# Patient Record
Sex: Female | Born: 1985
Health system: Southern US, Community
[De-identification: ages and names within clinical notes are randomized; demographics above are authoritative.]

## PROBLEM LIST (undated history)

## (undated) DIAGNOSIS — J45909 Unspecified asthma, uncomplicated: Secondary | ICD-10-CM

## (undated) DIAGNOSIS — E119 Type 2 diabetes mellitus without complications: Secondary | ICD-10-CM

## (undated) DIAGNOSIS — M069 Rheumatoid arthritis, unspecified: Secondary | ICD-10-CM

## (undated) HISTORY — PX: LAPAROSCOPIC OVARIAN: SHX5906

---

## 2018-03-30 ENCOUNTER — Inpatient Hospital Stay (HOSPITAL_COMMUNITY)
Admission: EM | Admit: 2018-03-30 | Discharge: 2018-04-01 | DRG: 638 | Disposition: A | Discharge status: Home / Self Care | Payer: Self-pay | Attending: Internal Medicine | Admitting: Internal Medicine

## 2018-03-30 ENCOUNTER — Encounter (HOSPITAL_COMMUNITY): Payer: Self-pay

## 2018-03-30 ENCOUNTER — Other Ambulatory Visit: Payer: Self-pay

## 2018-03-30 DIAGNOSIS — J45909 Unspecified asthma, uncomplicated: Secondary | ICD-10-CM | POA: Diagnosis present

## 2018-03-30 DIAGNOSIS — E1010 Type 1 diabetes mellitus with ketoacidosis without coma: Principal | ICD-10-CM | POA: Diagnosis present

## 2018-03-30 DIAGNOSIS — E860 Dehydration: Secondary | ICD-10-CM | POA: Diagnosis present

## 2018-03-30 DIAGNOSIS — E0810 Diabetes mellitus due to underlying condition with ketoacidosis without coma: Secondary | ICD-10-CM

## 2018-03-30 DIAGNOSIS — F17210 Nicotine dependence, cigarettes, uncomplicated: Secondary | ICD-10-CM | POA: Diagnosis present

## 2018-03-30 DIAGNOSIS — N170 Acute kidney failure with tubular necrosis: Secondary | ICD-10-CM

## 2018-03-30 DIAGNOSIS — F17200 Nicotine dependence, unspecified, uncomplicated: Secondary | ICD-10-CM | POA: Diagnosis present

## 2018-03-30 DIAGNOSIS — R079 Chest pain, unspecified: Secondary | ICD-10-CM

## 2018-03-30 DIAGNOSIS — D72829 Elevated white blood cell count, unspecified: Secondary | ICD-10-CM | POA: Diagnosis present

## 2018-03-30 DIAGNOSIS — N179 Acute kidney failure, unspecified: Secondary | ICD-10-CM | POA: Diagnosis present

## 2018-03-30 DIAGNOSIS — M069 Rheumatoid arthritis, unspecified: Secondary | ICD-10-CM | POA: Diagnosis present

## 2018-03-30 DIAGNOSIS — E1110 Type 2 diabetes mellitus with ketoacidosis without coma: Secondary | ICD-10-CM | POA: Diagnosis present

## 2018-03-30 DIAGNOSIS — R109 Unspecified abdominal pain: Secondary | ICD-10-CM

## 2018-03-30 HISTORY — DX: Unspecified asthma, uncomplicated: J45.909

## 2018-03-30 HISTORY — DX: Type 2 diabetes mellitus without complications: E11.9

## 2018-03-30 HISTORY — DX: Rheumatoid arthritis, unspecified: M06.9

## 2018-03-30 LAB — URINALYSIS, ROUTINE W REFLEX MICROSCOPIC
Bilirubin Urine: NEGATIVE
Ketones, ur: 80 mg/dL — AB
LEUKOCYTES UA: NEGATIVE
Nitrite: NEGATIVE
PH: 5 (ref 5.0–8.0)
Protein, ur: 100 mg/dL — AB
Specific Gravity, Urine: 1.02 (ref 1.005–1.030)

## 2018-03-30 LAB — CBC
HEMATOCRIT: 51.5 % — AB (ref 36.0–46.0)
Hemoglobin: 16.9 g/dL — ABNORMAL HIGH (ref 12.0–15.0)
MCH: 32.7 pg (ref 26.0–34.0)
MCHC: 32.8 g/dL (ref 30.0–36.0)
MCV: 99.6 fL (ref 78.0–100.0)
Platelets: 273 10*3/uL (ref 150–400)
RBC: 5.17 MIL/uL — AB (ref 3.87–5.11)
RDW: 14.5 % (ref 11.5–15.5)
WBC: 16.7 10*3/uL — ABNORMAL HIGH (ref 4.0–10.5)

## 2018-03-30 LAB — COMPREHENSIVE METABOLIC PANEL
ALK PHOS: 124 U/L (ref 38–126)
ALT: 24 U/L (ref 0–44)
ANION GAP: 37 — AB (ref 5–15)
AST: 22 U/L (ref 15–41)
Albumin: 5.5 g/dL — ABNORMAL HIGH (ref 3.5–5.0)
BUN: 28 mg/dL — ABNORMAL HIGH (ref 6–20)
CO2: 8 mmol/L — ABNORMAL LOW (ref 22–32)
CREATININE: 1.71 mg/dL — AB (ref 0.44–1.00)
Calcium: 9.1 mg/dL (ref 8.9–10.3)
Chloride: 82 mmol/L — ABNORMAL LOW (ref 98–111)
GFR calc non Af Amer: 42 mL/min — ABNORMAL LOW (ref >60)
GFR, EST AFRICAN AMERICAN: 48 mL/min — AB (ref >60)
Glucose, Bld: 739 mg/dL (ref 70–99)
Potassium: 4.8 mmol/L (ref 3.5–5.1)
SODIUM: 127 mmol/L — AB (ref 135–145)
Total Bilirubin: 1.7 mg/dL — ABNORMAL HIGH (ref 0.3–1.2)
Total Protein: 9.6 g/dL — ABNORMAL HIGH (ref 6.5–8.1)

## 2018-03-30 LAB — CBG MONITORING, ED
Glucose-Capillary: >600 mg/dL (ref 70–99)
Glucose-Capillary: 530 mg/dL (ref 70–99)
Glucose-Capillary: 439 mg/dL — ABNORMAL HIGH (ref 70–99)

## 2018-03-30 LAB — GLUCOSE, CAPILLARY
Glucose-Capillary: 336 mg/dL — ABNORMAL HIGH (ref 70–99)
Glucose-Capillary: 329 mg/dL — ABNORMAL HIGH (ref 70–99)

## 2018-03-30 LAB — MRSA PCR SCREENING: MRSA by PCR: NEGATIVE

## 2018-03-30 LAB — I-STAT BETA HCG BLOOD, ED (MC, WL, AP ONLY): I-stat hCG, quantitative: <5 m[IU]/mL (ref <5)

## 2018-03-30 LAB — LIPASE, BLOOD: Lipase: 38 U/L (ref 11–51)

## 2018-03-30 LAB — BLOOD GAS, VENOUS
O2 Saturation: 92.1 %
PH VEN: 7.007 — AB (ref 7.250–7.430)
PO2 VEN: 88.9 mmHg — AB (ref 32.0–45.0)
Patient temperature: 98.6

## 2018-03-30 MED ORDER — ALBUTEROL SULFATE HFA 108 (90 BASE) MCG/ACT IN AERS: 2.0000 | INHALATION_SPRAY | Freq: Four times a day (QID) | RESPIRATORY_TRACT | Status: DC | PRN

## 2018-03-30 MED ORDER — SODIUM CHLORIDE 0.9 % IV SOLN
INTRAVENOUS | Status: DC
  Filled 2018-03-30: qty 1

## 2018-03-30 MED ORDER — ONDANSETRON HCL 4 MG/2ML IJ SOLN
4.0000 mg | Freq: Once | INTRAMUSCULAR | Status: AC
  Administered 2018-03-30: 4 mg via INTRAVENOUS
  Filled 2018-03-30: qty 2

## 2018-03-30 MED ORDER — ACETAMINOPHEN 325 MG PO TABS: 650.0000 mg | ORAL_TABLET | Freq: Four times a day (QID) | ORAL | Status: DC | PRN

## 2018-03-30 MED ORDER — SODIUM CHLORIDE 0.9 % IV SOLN
INTRAVENOUS | Status: DC
  Administered 2018-03-30: 5.4 [IU]/h via INTRAVENOUS
  Filled 2018-03-30 (×2): qty 1

## 2018-03-30 MED ORDER — SODIUM CHLORIDE 0.9 % IV SOLN
INTRAVENOUS | Status: DC
  Administered 2018-03-30: via INTRAVENOUS

## 2018-03-30 MED ORDER — SODIUM CHLORIDE 0.9 % IV BOLUS
1000.0000 mL | Freq: Once | INTRAVENOUS | Status: AC
  Administered 2018-03-30: 1000 mL via INTRAVENOUS

## 2018-03-30 MED ORDER — KETOROLAC TROMETHAMINE 30 MG/ML IJ SOLN
30.0000 mg | Freq: Once | INTRAMUSCULAR | Status: AC
  Administered 2018-03-30: 30 mg via INTRAVENOUS
  Filled 2018-03-30: qty 1

## 2018-03-30 MED ORDER — NICOTINE 14 MG/24HR TD PT24
14.0000 mg | MEDICATED_PATCH | Freq: Every day | TRANSDERMAL | Status: DC
  Administered 2018-03-31 – 2018-04-01 (×3): 14 mg via TRANSDERMAL
  Filled 2018-03-30 (×3): qty 1

## 2018-03-30 MED ORDER — DEXTROSE-NACL 5-0.45 % IV SOLN
INTRAVENOUS | Status: DC
  Administered 2018-03-30 – 2018-03-31 (×2): via INTRAVENOUS

## 2018-03-30 MED ORDER — POTASSIUM CHLORIDE 10 MEQ/100ML IV SOLN
10.0000 meq | INTRAVENOUS | Status: AC
  Administered 2018-03-30 – 2018-03-31 (×2): 10 meq via INTRAVENOUS
  Filled 2018-03-30: qty 100

## 2018-03-30 MED ORDER — DEXTROSE-NACL 5-0.45 % IV SOLN: INTRAVENOUS | Status: DC

## 2018-03-30 MED ORDER — POTASSIUM CHLORIDE 10 MEQ/100ML IV SOLN
10.0000 meq | INTRAVENOUS | Status: AC
  Administered 2018-03-30 (×2): 10 meq via INTRAVENOUS
  Filled 2018-03-30 (×2): qty 100

## 2018-03-30 MED ORDER — SODIUM CHLORIDE 0.9 % IV SOLN: INTRAVENOUS | Status: DC

## 2018-03-30 MED ORDER — ALBUTEROL SULFATE (2.5 MG/3ML) 0.083% IN NEBU: 2.5000 mg | INHALATION_SOLUTION | Freq: Four times a day (QID) | RESPIRATORY_TRACT | Status: DC | PRN

## 2018-03-30 NOTE — H&P (Signed)
History and Physical   Madison Williams XNA:[id number] DOB: 12/02/1995 DOA: 03/30/2018  Referring MD/NP/PA: Dr Jacqulyn Bath  PCP: Patient, No Pcp Per   Patient coming from: Home  Chief Complaint: Weakness, abdominal pain nausea and vomiting  HPI: Madison Williams is a 32 y.o. female with medical history significant of type 1 diabetes, tobacco use disorder who has been having abdominal pain with nausea vomiting for at least 3 days.  Patient is on 70/30 insulin.  She did not use it because she was sick.  She came to the ER with persistent abdominal pain nausea vomiting.  Also generalized weakness.  Patient is dehydrated.  She is retching trying to throw up constantly.  Evaluation showed evidence of diabetic ketoacidosis with acute kidney injury.  Patient has received initial IV fluids in the ER with IV insulin and is being admitted for treatment of diabetic ketoacidosis.  ED Course: Patient's temperature is 97.3, blood pressure is 157/104, pulse is 124, respirate of 39, oxygen sat is 97% on room air.  Initial blood sugar is 530.  Sodium is 127, chloride 82, potassium 4.8, CO2 8, glucose 739, BUN of 28, creatinine 1.71 and a gap of 37.  Alkaline Foss was 124 with albumin 5.5.  Total protein is 9.6 total bilirubin 1.7.  White count is 16.7 hemoglobin 16.9 and platelets of 273.  ABG showed a pH of 7.007.  Urine showed ketonuria but also glucosuria.  Review of Systems: As per HPI otherwise 10 point review of systems negative.    Past Medical History:  Diagnosis Date  . Asthma   . Diabetes mellitus without complication (HCC)   . Rheumatoid arthritis [facility])     Past Surgical History:  Procedure Laterality Date  . LAPAROSCOPIC OVARIAN       reports that she has been smoking. She has been smoking about 0.50 packs per day. She has never used smokeless tobacco. She reports that she drinks alcohol. She reports that she does not use drugs.  No Known Allergies  No family history on  file.   Prior to Admission medications   Medication Sig Start Date End Date Taking? Authorizing Provider  acetaminophen (TYLENOL) 325 MG tablet Take 650 mg by mouth every 6 (six) hours as needed for moderate pain.   Yes [provider]  albuterol (PROVENTIL HFA;VENTOLIN HFA) 108 (90 Base) MCG/ACT inhaler Inhale 2 puffs into the lungs every 6 (six) hours as needed for wheezing or shortness of breath.   Yes [provider]  ibuprofen (ADVIL,MOTRIN) 200 MG tablet Take 400 mg by mouth every 6 (six) hours as needed for moderate pain.   Yes [provider]  insulin NPH-regular Human (NOVOLIN 70/30) (70-30) 100 UNIT/ML injection Inject 15 Units into the skin 2 (two) times daily with a meal.   Yes [provider]    Physical Exam: Vitals:   03/30/18 2000 03/30/18 2030 03/30/18 2034 03/30/18 2100  BP: (!) 145/95 134/90 134/90 (!) 141/88  Pulse: (!) 116 (!) 115 (!) 116 (!) 123  Resp: (!) 24 (!) 23 (!) 24 20  Temp:      TempSrc:      SpO2: 99% 100% 99% 99%  Weight:      Height:          Constitutional: NAD, calm, comfortable Vitals:   03/30/18 2000 03/30/18 2030 03/30/18 2034 03/30/18 2100  BP: (!) 145/95 134/90 134/90 (!) 141/88  Pulse: (!) 116 (!) 115 (!) 116 (!) 123  Resp: (!) 24 Marland Kitchen)  23 (!) 24 20  Temp:      TempSrc:      SpO2: 99% 100% 99% 99%  Weight:      Height:        Acutely ill looking, slightly cachectic Eyes: PERRL, lids and conjunctivae normal ENMT: Mucous membranes are moist. Posterior pharynx clear of any exudate or lesions.Normal dentition.  Neck: normal, supple, no masses, no thyromegaly Respiratory: clear to auscultation bilaterally, no wheezing, no crackles. Normal respiratory effort. No accessory muscle use.  Cardiovascular: Regular rate and rhythm, no murmurs / rubs / gallops. No extremity edema. 2+ pedal pulses. No carotid bruits.  Abdomen: Diffuse mild tenderness, no masses palpated. No hepatosplenomegaly. Bowel sounds  positive.  Musculoskeletal: no clubbing / cyanosis. No joint deformity upper and lower extremities. Good ROM, no contractures. Normal muscle tone.  Skin: Dry skin and mucous membranes, no rashes, lesions, ulcers. No induration Neurologic: CN 2-12 grossly intact. Sensation intact, DTR normal. Strength 5/5 in all 4.  Psychiatric: Normal judgment and insight. Alert and oriented x 3. Normal mood.     Labs on Admission: I have personally reviewed following labs and imaging studies  CBC: Recent Labs  Lab 03/30/18 1740  WBC 16.7*  HGB 16.9*  HCT 51.5*  MCV 99.6  PLT 273   Basic Metabolic Panel: Recent Labs  Lab 03/30/18 1740  NA 127*  K 4.8  CL 82*  CO2 8*  GLUCOSE 739*  BUN 28*  CREATININE 1.71*  CALCIUM 9.1   GFR: Estimated Creatinine Clearance: 48.5 mL/min (A) (by C-G formula based on SCr of 1.71 mg/dL (H)). Liver Function Tests: Recent Labs  Lab 03/30/18 1740  AST 22  ALT 24  ALKPHOS 124  BILITOT 1.7*  PROT 9.6*  ALBUMIN 5.5*   Recent Labs  Lab 03/30/18 1740  LIPASE 38   No results for input(s): AMMONIA in the last 168 hours. Coagulation Profile: No results for input(s): INR, PROTIME in the last 168 hours. Cardiac Enzymes: No results for input(s): CKTOTAL, CKMB, CKMBINDEX, TROPONINI in the last 168 hours. BNP (last 3 results) No results for input(s): PROBNP in the last 8760 hours. HbA1C: No results for input(s): HGBA1C in the last 72 hours. CBG: Recent Labs  Lab 03/30/18 1708 03/30/18 1859 03/30/18 2002 03/30/18 2041  GLUCAP >600* >600* 530* 439*   Lipid Profile: No results for input(s): CHOL, HDL, LDLCALC, TRIG, CHOLHDL, LDLDIRECT in the last 72 hours. Thyroid Function Tests: No results for input(s): TSH, T4TOTAL, FREET4, T3FREE, THYROIDAB in the last 72 hours. Anemia Panel: No results for input(s): VITAMINB12, FOLATE, FERRITIN, TIBC, IRON, RETICCTPCT in the last 72 hours. Urine analysis:    Component Value Date/Time   COLORURINE YELLOW  03/30/2018 1740   APPEARANCEUR CLEAR 03/30/2018 1740   LABSPEC 1.020 03/30/2018 1740   PHURINE 5.0 03/30/2018 1740   GLUCOSEU >=500 (A) 03/30/2018 1740   HGBUR MODERATE (A) 03/30/2018 1740   BILIRUBINUR NEGATIVE 03/30/2018 1740   KETONESUR 80 (A) 03/30/2018 1740   PROTEINUR 100 (A) 03/30/2018 1740   NITRITE NEGATIVE 03/30/2018 1740   LEUKOCYTESUR NEGATIVE 03/30/2018 1740   Sepsis Labs: @LABRCNTIP (procalcitonin:4,lacticidven:4) )No results found for this or any previous visit (from the past 240 hour(s)).   Radiological Exams on Admission: No results found.  EKG: Independently reviewed.  EKG showed sinus tachycardia with repolarization abnormalities.  Assessment/Plan Active Problems:   DKA (diabetic ketoacidoses) (HCC)   ARF (acute renal failure) (HCC)   Leucocytosis   Dehydration   Tobacco use disorder     #  1 DKA: Patient will be admitted to stepdown unit.  Aggressive hydration with IV insulin drip.  Serial CBGs every hour BMP every 4 hours.  Monitor potassium as supplement as necessary.  Monitor patient's anion gap as well as bicarbonate level.  Once gap is closed transition to have subcutaneous insulin.  #2 persistent nausea vomiting abdominal pain: Most likely as a result of DKA.  Treat symptomatically.  #3 acute kidney injury: Most likely secondary to dehydration.  Prerenal therefore.  Aggressively hydrate and follow renal function.  #4 severe dehydration: Secondary to DKA with nausea vomiting.  Hydrate patient aggressively.  #5 pseudohyponatremia: Secondary to blood sugar being high.  Monitor sodium level as patient improves.  #6 tobacco abuse: Initiate tobacco cessation counseling with nicotine patch.   DVT prophylaxis: SCD Code Status: Full code Family Communication: No family available Disposition Plan: Home Consults called: None Admission status: Inpatient to stepdown  Severity of Illness: The appropriate patient status for this patient is INPATIENT.  Inpatient status is judged to be reasonable and necessary in order to provide the required intensity of service to ensure the patient's safety. The patient's presenting symptoms, physical exam findings, and initial radiographic and laboratory data in the context of their chronic comorbidities is felt to place them at high risk for further clinical deterioration. Furthermore, it is not anticipated that the patient will be medically stable for discharge from the hospital within 2 midnights of admission. The following factors support the patient status of inpatient.   " The patient's presenting symptoms include nausea vomiting abdominal pain. " The worrisome physical exam findings include severe dehydration. " The initial radiographic and laboratory data are worrisome because of anion gap of 39 with acute kidney injury and blood sugar more than 800. " The chronic co-morbidities include type 1 diabetes.   * I certify that at the point of admission it is my clinical judgment that the patient will require inpatient hospital care spanning beyond 2 midnights from the point of admission due to high intensity of service, high risk for further deterioration and high frequency of surveillance required.Lonia Blood MD [facility] Pager [phone]  If 7PM-7AM, please contact night-coverage [email or web address] Password [facility]  03/30/2018, 9:42 PM

## 2018-03-30 NOTE — ED Provider Notes (Signed)
Emergency Department Provider Note   I have reviewed the triage vital signs and the nursing notes.   HISTORY  Chief Complaint Abdominal Pain and Emesis   HPI Madison Williams is a 32 y.o. female asthma, DM, RA presents to the emergency department for evaluation of nausea vomiting for the past several days.  Patient has felt short of breath and is unable to keep anything down.  She is a type I diabetic states she is been following her blood sugars but today they have been elevated.  She has not given herself insulin today because she was not able to eat anything.  She is having pain in the bilateral flanks.  Denies UTI symptoms.  No chest pain. No fever or chills.    Past Medical History:  Diagnosis Date  . Asthma   . Diabetes mellitus without complication (HCC)   . Rheumatoid arthritis [facility])     Patient Active Problem List   Diagnosis Date Noted  . DKA (diabetic ketoacidoses) (HCC) 03/30/2018  . ARF (acute renal failure) (HCC) 03/30/2018  . Leucocytosis 03/30/2018  . Dehydration 03/30/2018  . Tobacco use disorder 03/30/2018    Past Surgical History:  Procedure Laterality Date  . LAPAROSCOPIC OVARIAN     Allergies Patient has no known allergies.  History reviewed. No pertinent family history.  Social History Social History   Tobacco Use  . Smoking status: Current Every Day Smoker    Packs/day: 0.50  . Smokeless tobacco: Never Used  Substance Use Topics  . Alcohol use: Yes    Comment: rarely  . Drug use: Never    Review of Systems   Constitutional: No fever/chills Eyes: No visual changes. ENT: No sore throat. Cardiovascular: Denies chest pain. Respiratory: Denies shortness of breath. Gastrointestinal: No abdominal pain.  Positive nausea and vomiting.  No diarrhea.  No constipation. Genitourinary: Negative for dysuria. Positive bilateral flank pain.  Musculoskeletal: Negative for back pain. Skin: Negative for rash. Neurological: Negative for  headaches, focal weakness or numbness.  10-point ROS otherwise negative.  ____________________________________________   PHYSICAL EXAM:  VITAL SIGNS: ED Triage Vitals  Enc Vitals Group     BP 03/30/18 1706 (!) 150/102     Pulse Rate 03/30/18 1706 (!) 115     Resp 03/30/18 1706 (!) 22     Temp 03/30/18 1706 (!) 97.3 F (36.3 C)     Temp Source 03/30/18 1706 Axillary     SpO2 03/30/18 1706 97 %     Weight 03/30/18 1704 130 lb (59 kg)     Height 03/30/18 1704 5\' 7"  (1.702 m)     Pain Score 03/30/18 1704 8   Constitutional: Alert and oriented. Patient appears uncomfortable with rapid breathing.  Eyes: Conjunctivae are normal.  Head: Atraumatic. Nose: No congestion/rhinnorhea. Mouth/Throat: Mucous membranes are dry.  Neck: No stridor.   Cardiovascular: Tachycardia. Good peripheral circulation. Grossly normal heart sounds.   Respiratory: Increased respiratory effort. No retractions. Lungs CTAB. Gastrointestinal: Soft and nontender. No distention.  Musculoskeletal: No lower extremity tenderness nor edema. No gross deformities of extremities. Neurologic:  Normal speech and language. No gross focal neurologic deficits are appreciated.  Skin:  Skin is warm, dry and intact. No rash noted.  ____________________________________________   LABS (all labs ordered are listed, but only abnormal results are displayed)  Labs Reviewed  COMPREHENSIVE METABOLIC PANEL - Abnormal; Notable for the following components:      Result Value   Sodium 127 (*)    Chloride 82 (*)  CO2 8 (*)    Glucose, Bld 739 (*)    BUN 28 (*)    Creatinine, Ser 1.71 (*)    Total Protein 9.6 (*)    Albumin 5.5 (*)    Total Bilirubin 1.7 (*)    GFR calc non Af Amer 42 (*)    GFR calc Af Amer 48 (*)    Anion gap 37 (*)    All other components within normal limits  CBC - Abnormal; Notable for the following components:   WBC 16.7 (*)    RBC 5.17 (*)    Hemoglobin 16.9 (*)    HCT 51.5 (*)    All other  components within normal limits  URINALYSIS, ROUTINE W REFLEX MICROSCOPIC - Abnormal; Notable for the following components:   Glucose, UA >=500 (*)    Hgb urine dipstick MODERATE (*)    Ketones, ur 80 (*)    Protein, ur 100 (*)    Bacteria, UA RARE (*)    All other components within normal limits  BLOOD GAS, VENOUS - Abnormal; Notable for the following components:   pH, Ven 7.007 (*)    pO2, Ven 88.9 (*)    All other components within normal limits  GLUCOSE, CAPILLARY - Abnormal; Notable for the following components:   Glucose-Capillary 336 (*)    All other components within normal limits  GLUCOSE, CAPILLARY - Abnormal; Notable for the following components:   Glucose-Capillary 329 (*)    All other components within normal limits  CBG MONITORING, ED - Abnormal; Notable for the following components:   Glucose-Capillary >600 (*)    All other components within normal limits  CBG MONITORING, ED - Abnormal; Notable for the following components:   Glucose-Capillary >600 (*)    All other components within normal limits  CBG MONITORING, ED - Abnormal; Notable for the following components:   Glucose-Capillary 530 (*)    All other components within normal limits  CBG MONITORING, ED - Abnormal; Notable for the following components:   Glucose-Capillary 439 (*)    All other components within normal limits  MRSA PCR SCREENING  LIPASE, BLOOD  HIV ANTIBODY (ROUTINE TESTING)  BASIC METABOLIC PANEL  BASIC METABOLIC PANEL  BASIC METABOLIC PANEL  BASIC METABOLIC PANEL  MAGNESIUM  BASIC METABOLIC PANEL  BASIC METABOLIC PANEL  I-STAT BETA HCG BLOOD, ED ([facility], [facility], [facility] ONLY)   ____________________________________________  EKG   EKG Interpretation  Date/Time:  Saturday March 30 2018 17:03:28 EDT Ventricular Rate:  129 PR Interval:    QRS Duration: 98 QT Interval:  302 QTC Calculation: 443 R Axis:   84 Text Interpretation:  Sinus tachycardia LAE, consider biatrial enlargement Repol abnrm  suggests ischemia, inferior leads No STEMI.  Confirmed by Alona Bene [phone]) on 03/31/2018 12:03:48 AM       ____________________________________________  RADIOLOGY  None ____________________________________________   PROCEDURES  Procedure(s) performed:   .Critical Care Performed by: Maia Plan, MD Authorized by: Maia Plan, MD   Critical care provider statement:    Critical care time (minutes):  35   Critical care time was exclusive of:  Separately billable procedures and treating other patients and teaching time   Critical care was necessary to treat or prevent imminent or life-threatening deterioration of the following conditions:  Endocrine crisis and dehydration   Critical care was time spent personally by me on the following activities:  Blood draw for specimens, development of treatment plan with patient or surrogate, evaluation of patient's response to treatment,  examination of patient, obtaining history from patient or surrogate, ordering and performing treatments and interventions, ordering and review of laboratory studies, ordering and review of radiographic studies, pulse oximetry, re-evaluation of patient's condition and review of old charts   I assumed direction of critical care for this patient from another provider in my specialty: no       ____________________________________________   INITIAL IMPRESSION / ASSESSMENT AND PLAN / ED COURSE  Pertinent labs & imaging results that were available during my care of the patient were reviewed by me and considered in my medical decision making (see chart for details).  Patient presents to the emergency department for evaluation of flank pain, shortness of breath, and hyperglycemia.  Clinically my suspicion for DKA is elevated.  Labs are pending but CBC showed a white count of 16.7.  The patient is tachycardic and tachypneic.  Lungs are clear with normal oxygen saturation.   06:27 PM Patient with DKA on labs  with pH just over 7. Potassium is 4.8. Patient to get additional IVF and insulin infusion. Suspect that flank pain is DKA related but will follow UA. No fever.   Discussed patient's case with Hospitalist to request admission. Patient and family (if present) updated with plan. Care transferred to Hospitalist service.  I reviewed all nursing notes, vitals, pertinent old records, EKGs, labs, imaging (as available).  ____________________________________________  FINAL CLINICAL IMPRESSION(S) / ED DIAGNOSES  Final diagnoses:  Diabetic ketoacidosis without coma associated with type 1 diabetes mellitus (HCC)     MEDICATIONS GIVEN DURING THIS VISIT:  Medications  dextrose 5 %-0.45 % sodium chloride infusion ( Intravenous New Bag/Given 03/30/18 1858)  sodium chloride 0.9 % bolus 1,000 mL (1,000 mLs Intravenous New Bag/Given 03/30/18 1841)    And  0.9 %  sodium chloride infusion (has no administration in time range)  acetaminophen (TYLENOL) tablet 650 mg (has no administration in time range)  0.9 %  sodium chloride infusion (has no administration in time range)  0.9 %  sodium chloride infusion ( Intravenous New Bag/Given 03/30/18 2335)  dextrose 5 %-0.45 % sodium chloride infusion (has no administration in time range)  insulin regular (NOVOLIN R,HUMULIN R) 100 Units in sodium chloride 0.9 % 100 mL (1 Units/mL) infusion (5.4 Units/hr Intravenous Rate/Dose Change 03/30/18 2310)  potassium chloride 10 mEq in 100 mL IVPB (10 mEq Intravenous New Bag/Given 03/30/18 2339)  albuterol (PROVENTIL) (2.5 MG/3ML) 0.083% nebulizer solution 2.5 mg (has no administration in time range)  nicotine (NICODERM CQ - dosed in mg/24 hours) patch 14 mg (has no administration in time range)  sodium chloride 0.9 % bolus 1,000 mL (0 mLs Intravenous Stopped 03/30/18 2104)  ondansetron (ZOFRAN) injection 4 mg (4 mg Intravenous Given 03/30/18 1753)  ketorolac (TORADOL) 30 MG/ML injection 30 mg (30 mg Intravenous Given 03/30/18 1753)    potassium chloride 10 mEq in 100 mL IVPB (0 mEq Intravenous Stopped 03/30/18 2105)    Note:  This document was prepared using Dragon voice recognition software and may include unintentional dictation errors.  Alona Bene, MD Emergency Medicine    Zacharias Ridling, Arlyss Repress, MD 03/31/18 [phone]

## 2018-03-30 NOTE — ED Triage Notes (Addendum)
Per stepfather, pt has had emesis for several days, and has been unable to keep down water. Pt states that she has thrown up so much "that it hurts so bad to breath". Pt is a type 1 diabetic. Pt states her last sugar was 172 at 1400. Pt states that her sugars have been relatively normal. Pt c/o bilateral flank pain.

## 2018-03-30 NOTE — Progress Notes (Signed)
Tried to call report to the ED, unsuccessful at this time.

## 2018-03-30 NOTE — ED Notes (Signed)
Date and time results received: 03/30/18 6:18 PM (use smartphrase ".now" to insert current time)  Test: glucose Critical Value: 739  Name of Provider Notified: Dr LONG  Orders Received? Or Actions Taken?: provider and nurse notified

## 2018-03-30 NOTE — ED Notes (Signed)
Pt unable to void at this time. 

## 2018-03-31 ENCOUNTER — Inpatient Hospital Stay (HOSPITAL_COMMUNITY): Payer: Self-pay

## 2018-03-31 DIAGNOSIS — E1010 Type 1 diabetes mellitus with ketoacidosis without coma: Principal | ICD-10-CM

## 2018-03-31 LAB — LACTIC ACID, PLASMA: LACTIC ACID, VENOUS: 1.1 mmol/L (ref 0.5–1.9)

## 2018-03-31 LAB — BASIC METABOLIC PANEL
ANION GAP: 21 — AB (ref 5–15)
ANION GAP: 11 (ref 5–15)
Anion gap: 9 (ref 5–15)
BUN: 20 mg/dL (ref 6–20)
BUN: 17 mg/dL (ref 6–20)
BUN: 14 mg/dL (ref 6–20)
CALCIUM: 8.3 mg/dL — AB (ref 8.9–10.3)
CALCIUM: 8.5 mg/dL — AB (ref 8.9–10.3)
CALCIUM: 9.3 mg/dL (ref 8.9–10.3)
CHLORIDE: 101 mmol/L (ref 98–111)
CHLORIDE: 104 mmol/L (ref 98–111)
CO2: 9 mmol/L — AB (ref 22–32)
CO2: 17 mmol/L — AB (ref 22–32)
CO2: 20 mmol/L — AB (ref 22–32)
Chloride: 108 mmol/L (ref 98–111)
Creatinine, Ser: 0.99 mg/dL (ref 0.44–1.00)
Creatinine, Ser: 0.84 mg/dL (ref 0.44–1.00)
Creatinine, Ser: 0.76 mg/dL (ref 0.44–1.00)
GFR calc Af Amer: >60 mL/min (ref >60)
GFR calc non Af Amer: >60 mL/min (ref >60)
GFR calc non Af Amer: >60 mL/min (ref >60)
GFR calc non Af Amer: >60 mL/min (ref >60)
Glucose, Bld: 347 mg/dL — ABNORMAL HIGH (ref 70–99)
Glucose, Bld: 241 mg/dL — ABNORMAL HIGH (ref 70–99)
Glucose, Bld: 146 mg/dL — ABNORMAL HIGH (ref 70–99)
Potassium: 4.3 mmol/L (ref 3.5–5.1)
Potassium: 4.4 mmol/L (ref 3.5–5.1)
Potassium: 3.6 mmol/L (ref 3.5–5.1)
SODIUM: 132 mmol/L — AB (ref 135–145)
SODIUM: 137 mmol/L (ref 135–145)
Sodium: 131 mmol/L — ABNORMAL LOW (ref 135–145)

## 2018-03-31 LAB — GLUCOSE, CAPILLARY
GLUCOSE-CAPILLARY: 218 mg/dL — AB (ref 70–99)
GLUCOSE-CAPILLARY: 171 mg/dL — AB (ref 70–99)
GLUCOSE-CAPILLARY: 125 mg/dL — AB (ref 70–99)
GLUCOSE-CAPILLARY: 125 mg/dL — AB (ref 70–99)
Glucose-Capillary: 126 mg/dL — ABNORMAL HIGH (ref 70–99)
Glucose-Capillary: 127 mg/dL — ABNORMAL HIGH (ref 70–99)
Glucose-Capillary: 157 mg/dL — ABNORMAL HIGH (ref 70–99)
Glucose-Capillary: 191 mg/dL — ABNORMAL HIGH (ref 70–99)
Glucose-Capillary: 223 mg/dL — ABNORMAL HIGH (ref 70–99)
Glucose-Capillary: 225 mg/dL — ABNORMAL HIGH (ref 70–99)
Glucose-Capillary: 297 mg/dL — ABNORMAL HIGH (ref 70–99)
Glucose-Capillary: 320 mg/dL — ABNORMAL HIGH (ref 70–99)

## 2018-03-31 LAB — HIV ANTIBODY (ROUTINE TESTING W REFLEX): HIV SCREEN 4TH GENERATION: NONREACTIVE

## 2018-03-31 LAB — MAGNESIUM: MAGNESIUM: 2.3 mg/dL (ref 1.7–2.4)

## 2018-03-31 IMAGING — DX DG CHEST 1V PORT
1 series · 1 of 1 positions shown · non-contrast
Comparison: None.

CLINICAL DATA: Vomiting for 2 days.

EXAM:
PORTABLE CHEST 1 VIEW

[chest ap]
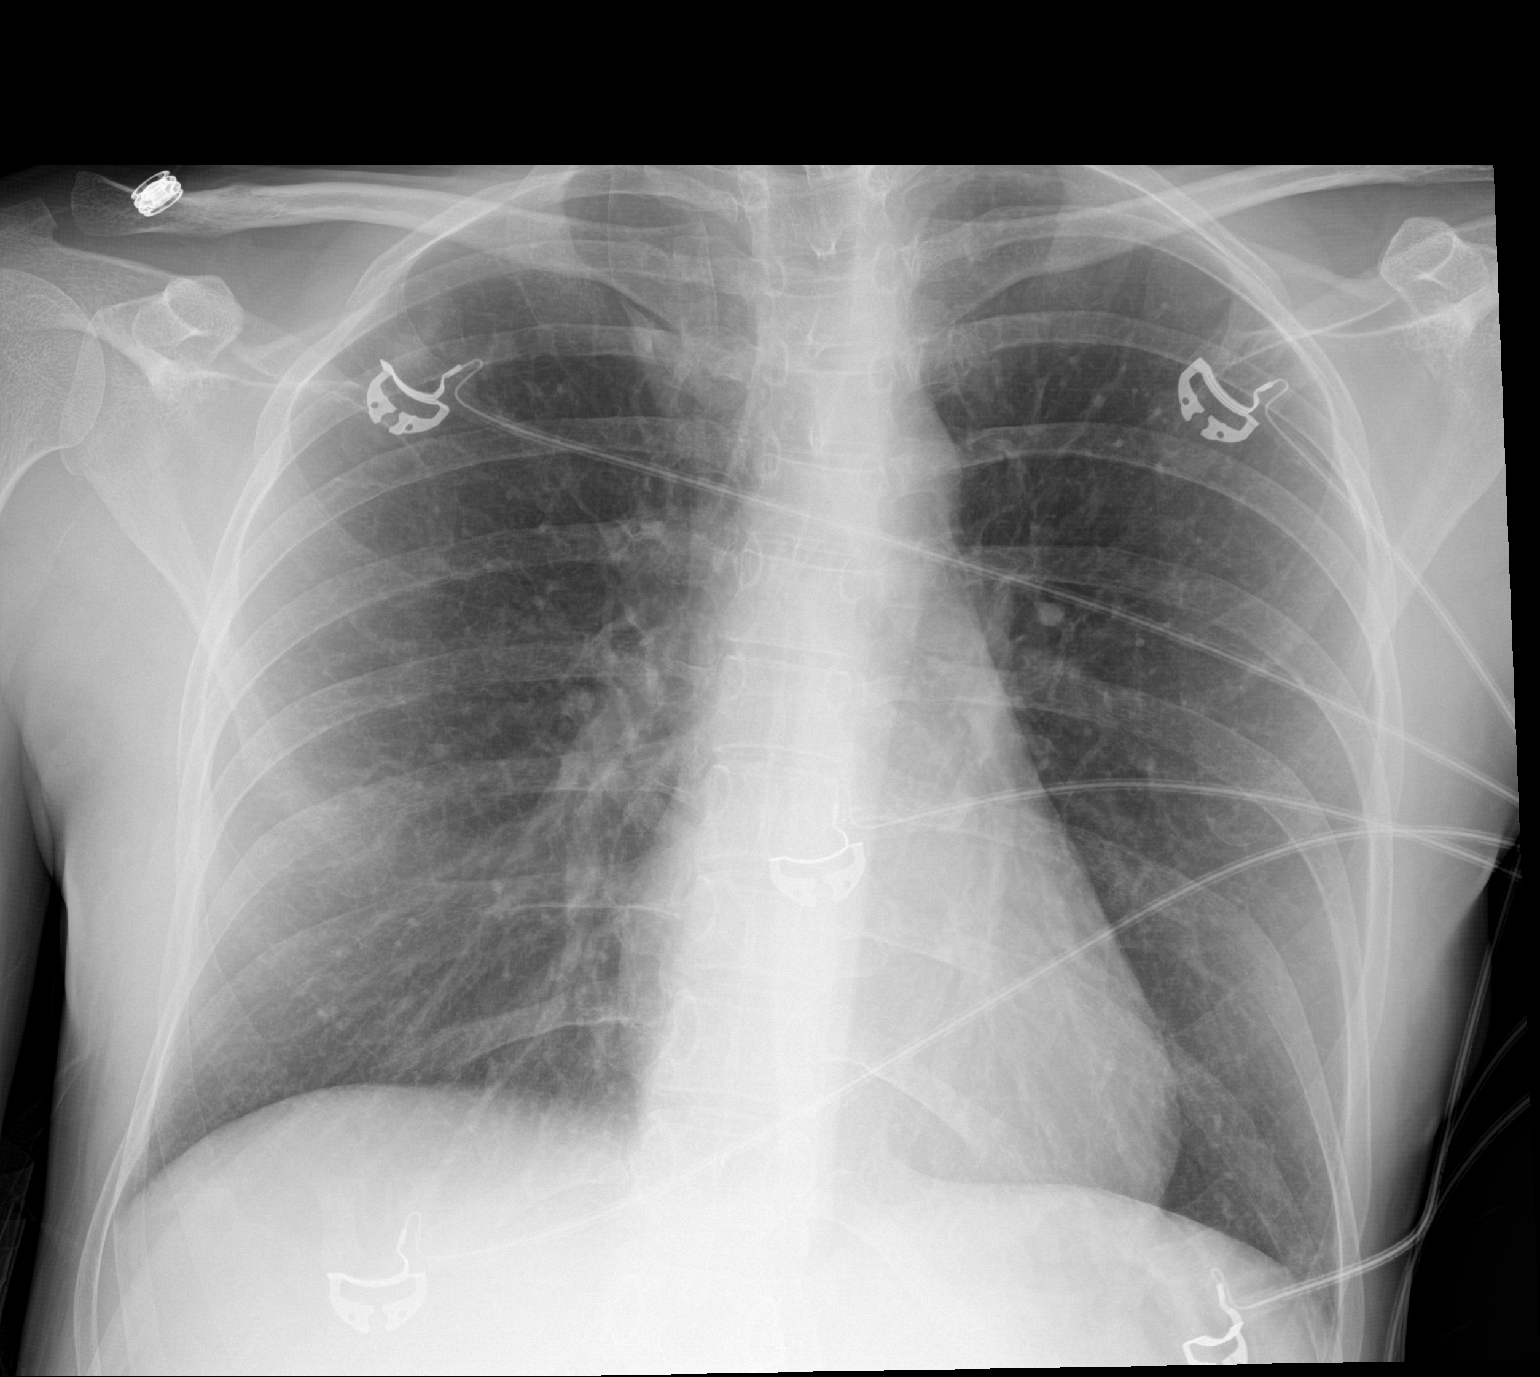

[1 of 1 positions shown; findings below may reference images not displayed]

FINDINGS: The lungs are clear. Heart size is normal. No pneumothorax or
pleural effusion. No acute or focal bony abnormality.
IMPRESSION: No acute disease.

## 2018-03-31 MED ORDER — INSULIN ASPART PROT & ASPART (70-30 MIX) 100 UNIT/ML ~~LOC~~ SUSP
15.0000 [IU] | Freq: Two times a day (BID) | SUBCUTANEOUS | Status: DC
  Administered 2018-04-01: 15 [IU] via SUBCUTANEOUS
  Filled 2018-03-31: qty 10

## 2018-03-31 MED ORDER — INSULIN ASPART 100 UNIT/ML ~~LOC~~ SOLN
3.0000 [IU] | Freq: Three times a day (TID) | SUBCUTANEOUS | Status: DC
  Administered 2018-03-31 – 2018-04-01 (×4): 3 [IU] via SUBCUTANEOUS

## 2018-03-31 MED ORDER — INSULIN NPH (HUMAN) (ISOPHANE) 100 UNIT/ML ~~LOC~~ SUSP: 15.0000 [IU] | Freq: Two times a day (BID) | SUBCUTANEOUS | Status: DC

## 2018-03-31 MED ORDER — SODIUM CHLORIDE 0.9 % IV SOLN
250.0000 mL | INTRAVENOUS | Status: DC | PRN
  Administered 2018-03-31: 250 mL via INTRAVENOUS

## 2018-03-31 MED ORDER — ALUM & MAG HYDROXIDE-SIMETH 200-200-20 MG/5ML PO SUSP
15.0000 mL | ORAL | Status: DC | PRN
  Administered 2018-03-31 – 2018-04-01 (×2): 15 mL via ORAL
  Filled 2018-03-31 (×2): qty 30

## 2018-03-31 MED ORDER — SODIUM CHLORIDE 0.9% FLUSH: 3.0000 mL | INTRAVENOUS | Status: DC | PRN

## 2018-03-31 MED ORDER — INSULIN ASPART 100 UNIT/ML ~~LOC~~ SOLN
0.0000 [IU] | Freq: Three times a day (TID) | SUBCUTANEOUS | Status: DC
  Administered 2018-03-31 (×2): 3 [IU] via SUBCUTANEOUS
  Administered 2018-04-01: 8 [IU] via SUBCUTANEOUS

## 2018-03-31 MED ORDER — KETOROLAC TROMETHAMINE 30 MG/ML IJ SOLN: 30.0000 mg | Freq: Once | INTRAMUSCULAR | Status: DC

## 2018-03-31 MED ORDER — ONDANSETRON HCL 4 MG/2ML IJ SOLN
4.0000 mg | Freq: Four times a day (QID) | INTRAMUSCULAR | Status: DC | PRN
  Administered 2018-03-31: 4 mg via INTRAVENOUS
  Filled 2018-03-31: qty 2

## 2018-03-31 MED ORDER — ONDANSETRON HCL 4 MG/2ML IJ SOLN
4.0000 mg | Freq: Four times a day (QID) | INTRAMUSCULAR | Status: DC
  Administered 2018-03-31 – 2018-04-01 (×6): 4 mg via INTRAVENOUS
  Filled 2018-03-31 (×6): qty 2

## 2018-03-31 MED ORDER — INSULIN ASPART 100 UNIT/ML ~~LOC~~ SOLN
0.0000 [IU] | Freq: Every day | SUBCUTANEOUS | Status: DC
  Administered 2018-03-31: 3 [IU] via SUBCUTANEOUS

## 2018-03-31 MED ORDER — KETOROLAC TROMETHAMINE 30 MG/ML IJ SOLN
30.0000 mg | Freq: Three times a day (TID) | INTRAMUSCULAR | Status: DC
  Administered 2018-03-31 – 2018-04-01 (×3): 30 mg via INTRAVENOUS
  Filled 2018-03-31 (×3): qty 1

## 2018-03-31 MED ORDER — INSULIN NPH (HUMAN) (ISOPHANE) 100 UNIT/ML ~~LOC~~ SUSP
15.0000 [IU] | Freq: Two times a day (BID) | SUBCUTANEOUS | Status: DC
  Filled 2018-03-31: qty 10

## 2018-03-31 MED ORDER — SODIUM CHLORIDE 0.9 % IV SOLN
INTRAVENOUS | Status: DC
  Administered 2018-03-31 – 2018-04-01 (×2): via INTRAVENOUS

## 2018-03-31 MED ORDER — SODIUM CHLORIDE 0.9% FLUSH
3.0000 mL | Freq: Two times a day (BID) | INTRAVENOUS | Status: DC
  Administered 2018-03-31 (×2): 3 mL via INTRAVENOUS

## 2018-03-31 MED ORDER — FAMOTIDINE IN NACL 20-0.9 MG/50ML-% IV SOLN
20.0000 mg | Freq: Two times a day (BID) | INTRAVENOUS | Status: DC
  Administered 2018-03-31 (×2): 20 mg via INTRAVENOUS
  Filled 2018-03-31: qty 50

## 2018-03-31 MED ORDER — INSULIN GLARGINE 100 UNIT/ML ~~LOC~~ SOLN
30.0000 [IU] | Freq: Once | SUBCUTANEOUS | Status: AC
  Administered 2018-03-31: 30 [IU] via SUBCUTANEOUS
  Filled 2018-03-31 (×2): qty 0.3

## 2018-03-31 MED ORDER — SODIUM CHLORIDE 0.9 % IV SOLN: INTRAVENOUS | Status: DC | PRN

## 2018-03-31 MED ORDER — ALUM & MAG HYDROXIDE-SIMETH 200-200-20 MG/5ML PO SUSP
15.0000 mL | Freq: Four times a day (QID) | ORAL | Status: DC | PRN
  Administered 2018-03-31: 15 mL via ORAL
  Filled 2018-03-31: qty 30

## 2018-03-31 MED ORDER — PROMETHAZINE HCL 25 MG/ML IJ SOLN
12.5000 mg | Freq: Four times a day (QID) | INTRAMUSCULAR | Status: DC | PRN
  Administered 2018-03-31: 12.5 mg via INTRAVENOUS
  Filled 2018-03-31: qty 1

## 2018-03-31 NOTE — Progress Notes (Signed)
[facility] Progress Note  Patient: Madison Williams GMW:[id number]   PCP: Patient, No Pcp Per DOB: 12/02/1995   DOA: 03/30/2018   DOS: 03/31/2018   Date of Service: the patient was seen and examined on 03/31/2018  Subjective: Patient has complaint of nausea without any vomiting.  Also bilateral chest pain and epigastric pain.  Epigastric pain is more feeling like indigestion.  Passing gas.  Brief hospital course: Pt. with PMH of type 1 diabetes, active smoker; admitted on 03/30/2018, presented with complaint of nausea and vomiting for 3 days, was found to have DKA. Currently further plan is continue fluids and treatment for nausea.  Assessment and Plan: 1.  DKA. Type 1 diabetes. On 70/30 insulin at home which she did not use due to nausea and vomiting. Presenting with anion gap of 37 bicarb of 8. Glucose 739. Aggressively hydrated as well as also started on IV insulin with glucose stabilizer protocol. Anion gap for last 2 BMP has remained closed. Acidosis also improving. We will continue with IV normal saline infusion. Transition her to subcutaneous insulin.  Give 1 dose of Lantus 30 units today. Followed by 7030 15 units starting tomorrow. Continue with sliding scale insulin. Also 3 units of pre-meal coverage.  2.  Persistent nausea and vomiting. Epigastric abdominal pain. Bilateral chest pain. Chest x-ray unremarkable. X-ray abdomen also unremarkable. Likely this is all response to DKA and severe retching. We will put her on scheduled Toradol, scheduled Zofran.  Continue PRN Phenergan. Also place her on Pepcid.  3.  Dehydration with mild acute kidney injury. Continue IV hydration. Monitor.  4.  Active smoker. Tobacco counseling as well as nicotine patch.  Diet: Carb modified diet DVT Prophylaxis: subcutaneous Heparin  Advance goals of care discussion: full code  Family Communication: no family was present at bedside, at the time of interview.   Disposition:    Discharge to home.  Consultants: none Procedures: none  Antibiotics: Anti-infectives (From admission, onward)   None       Objective: Physical Exam: Vitals:   03/31/18 0354 03/31/18 0400 03/31/18 0600 03/31/18 0800  BP:  (!) 149/77 (!) 144/86 (!) 144/88  Pulse:  (!) 116 (!) 112 (!) 110  Resp:  (!) 29 17 (!) 25  Temp: 99.9 F (37.7 C)   98.8 F (37.1 C)  TempSrc: Oral   Oral  SpO2:  97% 98% 97%  Weight:      Height:        Intake/Output Summary (Last 24 hours) at 03/31/2018 1034 Last data filed at 03/31/2018 0959 Gross per 24 hour  Intake 1256.67 ml  Output 1000 ml  Net 256.67 ml   Filed Weights   03/30/18 1704  Weight: 59 kg   General: Alert, Awake and Oriented to Time, Place and Person. Appear in moderate distress, affect flat Eyes: PERRL, Conjunctiva normal ENT: Oral Mucosa clear dry. Neck: no JVD, no Abnormal Mass Or lumps Cardiovascular: S1 and S2 Present, no Murmur, Peripheral Pulses Present Respiratory: normal respiratory effort, Bilateral Air entry equal and Decreased, no use of accessory muscle, Clear to Auscultation, no Crackles, no wheezes Abdomen: Bowel Sound present, Soft and mild tenderness, no hernia Skin: no redness, no Rash, no induration Extremities: no Pedal edema, no calf tenderness Neurologic: Grossly no focal neuro deficit. Bilaterally Equal motor strength   Data Reviewed: CBC: Recent Labs  Lab 03/30/18 1740  WBC 16.7*  HGB 16.9*  HCT 51.5*  MCV 99.6  PLT 273   Basic Metabolic Panel: Recent Labs  Lab 03/30/18 1740 03/30/18 2316 03/31/18 0250 03/31/18 0829  NA 127* 131* 132* 137  K 4.8 4.3 4.4 3.6  CL 82* 101 104 108  CO2 8* 9* 17* 20*  GLUCOSE 739* 347* 241* 146*  BUN 28* 20 17 14   CREATININE 1.71* 0.99 0.84 0.76  CALCIUM 9.1 8.3* 8.5* 9.3  MG  --  2.3  --   --     Liver Function Tests: Recent Labs  Lab 03/30/18 1740  AST 22  ALT 24  ALKPHOS 124  BILITOT 1.7*  PROT 9.6*  ALBUMIN 5.5*   Recent Labs  Lab  03/30/18 1740  LIPASE 38   No results for input(s): AMMONIA in the last 168 hours. Coagulation Profile: No results for input(s): INR, PROTIME in the last 168 hours. Cardiac Enzymes: No results for input(s): CKTOTAL, CKMB, CKMBINDEX, TROPONINI in the last 168 hours. BNP (last 3 results) No results for input(s): PROBNP in the last 8760 hours. CBG: Recent Labs  Lab 03/31/18 0245 03/31/18 0352 03/31/18 0553 03/31/18 0643 03/31/18 0949  GLUCAP 225* 218* 171* 125* 127*   Studies: No results found.  Scheduled Meds: . insulin aspart  0-15 Units Subcutaneous TID WC  . insulin aspart  0-5 Units Subcutaneous QHS  . insulin aspart  3 Units Subcutaneous TID WC  . [START ON 04/01/2018] insulin aspart protamine- aspart  15 Units Subcutaneous BID WC  . insulin glargine  30 Units Subcutaneous Once  . ketorolac  30 mg Intravenous Q8H  . nicotine  14 mg Transdermal Daily  . ondansetron (ZOFRAN) IV  4 mg Intravenous Q6H  . sodium chloride flush  3 mL Intravenous Q12H   Continuous Infusions: . sodium chloride    . sodium chloride    . famotidine (PEPCID) IV     PRN Meds: sodium chloride, acetaminophen, albuterol, promethazine, sodium chloride flush  Time spent: 35 minutes  Author: 06/01/2018, MD [facility] Pager: [phone] 03/31/2018 10:34 AM  If 7PM-7AM, please contact night-coverage at [email or web address], password [facility]

## 2018-03-31 NOTE — Progress Notes (Signed)
Paged NP Blount this am for insulin transitioning orders, no return page at this time. I will continue to monitor.

## 2018-03-31 NOTE — Progress Notes (Signed)
CSW consulted for access to meter and resources for medication. Alerted RNCM.   No other CSW needs identified, signing off.       Enid Cutter, MSW, LCSWA Clinical Social Work [phone]

## 2018-04-01 LAB — BASIC METABOLIC PANEL
ANION GAP: 9 (ref 5–15)
BUN: 15 mg/dL (ref 6–20)
CALCIUM: 8.9 mg/dL (ref 8.9–10.3)
CO2: 24 mmol/L (ref 22–32)
Chloride: 104 mmol/L (ref 98–111)
Creatinine, Ser: 0.64 mg/dL (ref 0.44–1.00)
GFR calc Af Amer: >60 mL/min (ref >60)
GFR calc non Af Amer: >60 mL/min (ref >60)
GLUCOSE: 294 mg/dL — AB (ref 70–99)
Potassium: 3.5 mmol/L (ref 3.5–5.1)
Sodium: 137 mmol/L (ref 135–145)

## 2018-04-01 LAB — GLUCOSE, CAPILLARY
GLUCOSE-CAPILLARY: 162 mg/dL — AB (ref 70–99)
Glucose-Capillary: 278 mg/dL — ABNORMAL HIGH (ref 70–99)

## 2018-04-01 LAB — CBC WITH DIFFERENTIAL/PLATELET
BASOS ABS: 0 10*3/uL (ref 0.0–0.1)
Basophils Relative: 0 %
Eosinophils Absolute: 0 10*3/uL (ref 0.0–0.7)
Eosinophils Relative: 0 %
HEMATOCRIT: 36 % (ref 36.0–46.0)
Hemoglobin: 12.5 g/dL (ref 12.0–15.0)
LYMPHS ABS: 1.5 10*3/uL (ref 0.7–4.0)
Lymphocytes Relative: 24 %
MCH: 31.5 pg (ref 26.0–34.0)
MCHC: 34.7 g/dL (ref 30.0–36.0)
MCV: 90.7 fL (ref 78.0–100.0)
Monocytes Absolute: 0.9 10*3/uL (ref 0.1–1.0)
Monocytes Relative: 14 %
NEUTROS ABS: 3.8 10*3/uL (ref 1.7–7.7)
Neutrophils Relative %: 62 %
Platelets: 156 10*3/uL (ref 150–400)
RBC: 3.97 MIL/uL (ref 3.87–5.11)
RDW: 15.5 % (ref 11.5–15.5)
WBC: 6.2 10*3/uL (ref 4.0–10.5)

## 2018-04-01 LAB — HEMOGLOBIN A1C
HEMOGLOBIN A1C: 10.3 % — AB (ref 4.8–5.6)
Mean Plasma Glucose: 248.91 mg/dL

## 2018-04-01 MED ORDER — SUCRALFATE 1 GM/10ML PO SUSP
1.0000 g | Freq: Three times a day (TID) | ORAL | Status: DC
  Administered 2018-04-01 (×2): 1 g via ORAL
  Filled 2018-04-01 (×2): qty 10

## 2018-04-01 MED ORDER — PANTOPRAZOLE SODIUM 40 MG PO TBEC: 40.0000 mg | DELAYED_RELEASE_TABLET | Freq: Two times a day (BID) | ORAL | 0 refills | Status: DC

## 2018-04-01 MED ORDER — ALUM & MAG HYDROXIDE-SIMETH 200-200-20 MG/5ML PO SUSP: 15.0000 mL | ORAL | 0 refills | Status: DC | PRN

## 2018-04-01 MED ORDER — GI COCKTAIL ~~LOC~~
30.0000 mL | Freq: Once | ORAL | Status: AC
  Administered 2018-04-01: 30 mL via ORAL
  Filled 2018-04-01: qty 30

## 2018-04-01 MED ORDER — PANTOPRAZOLE SODIUM 40 MG PO TBEC
40.0000 mg | DELAYED_RELEASE_TABLET | Freq: Two times a day (BID) | ORAL | Status: DC
  Administered 2018-04-01: 40 mg via ORAL
  Filled 2018-04-01: qty 1

## 2018-04-01 MED ORDER — SUCRALFATE 1 GM/10ML PO SUSP: 1.0000 g | Freq: Three times a day (TID) | ORAL | 0 refills | Status: DC

## 2018-04-01 MED ORDER — FAMOTIDINE 20 MG PO TABS: 20.0000 mg | ORAL_TABLET | Freq: Every day | ORAL | 0 refills | Status: DC

## 2018-04-01 MED ORDER — NICOTINE 14 MG/24HR TD PT24: 14.0000 mg | MEDICATED_PATCH | Freq: Every day | TRANSDERMAL | 0 refills | Status: DC

## 2018-04-01 NOTE — Progress Notes (Signed)
Patient discharged to home. Discharge instructions given. Peripheral IV's removed. Print out of Rx's given. Pt.walked to car with family member with her belongings(clothes,cell phone [employer])

## 2018-04-01 NOTE — Progress Notes (Signed)
Inpatient Diabetes Program Recommendations  AACE/ADA: New Consensus Statement on Inpatient Glycemic Control (2015)  Target Ranges:  Prepandial:   less than 140 mg/dL      Peak postprandial:   less than 180 mg/dL (1-2 hours)      Critically ill patients:  140 - 180 mg/dL   Lab Results  Component Value Date   GLUCAP 162 (H) 04/01/2018    Review of Glycemic Control  Diabetes history: DM1 Outpatient Diabetes medications: 70/30 15 units bid Current orders for Inpatient glycemic control: Novolog 70/30 15 units bid, Novolog 0-15 units tidwc and hs + 3 units tidwc  No HgbA1C.  Inpatient Diabetes Program Recommendations:     For discharge:  Novolin 70/30 15 units bid Regular insulin for s/s 0-15 units tidwc.  Long discussion with pt regarding taking her insulin and checking her blood sugars more frequently. Recently moved to [facility] and has not gotten a PCP. Gave info about [facility] and stressed importance of having an MD follow her diabetes. Discussed importance of eating healthy and exercising daily, along with stress management. Instructed to check blood sugars 3-4x/day and take logbook to MD appt.   RN notified of above conversation. Pt seems willing to obtain MD and take care of her diabetes.  Thank you. Ailene Ards, RD, LDN, CDE Inpatient Diabetes Coordinator [phone]

## 2018-04-01 NOTE — Care Management Note (Addendum)
Case Management Note  Patient Details  Name: Madison Williams MRN: [id number] Date of Birth: Jun 09, 1986  Subjective/Objective:                   Patient has complaint of nausea without any vomiting.  Also bilateral chest pain and epigastric pain.  Epigastric pain is more feeling like indigestion.  Passing gas.  Brief hospital course: Pt. with PMH of type 1 diabetes, active smoker; admitted on 03/30/2018, presented with complaint of nausea and vomiting for 3 days, was found to have DKA. Currently further plan is continue fluids and treatment for nausea.  Assessment and Plan: 1.  DKA. Type 1 diabetes. On 70/30 insulin at home which she did not use due to nausea and vomiting. Presenting with anion gap of 37 bicarb of 8. Glucose 739. Aggressively hydrated as well as also started on IV insulin with glucose stabilizer protocol. Anion gap for last 2 BMP has remained closed. Acidosis also improving. We will continue with IV normal saline infusion. Transition her to subcutaneous insulin.  Give 1 dose of Lantus 30 units today. Followed by 7030 15 units starting tomorrow. Continue with sliding scale insulin. Also 3 units of pre-meal coverage.   Action/Plan: Following for progression Following for cm needs-medication Discharge to home with no needs Expected Discharge Date:  04/01/18               Expected Discharge Plan:  Home/Self Care  In-House Referral:     Discharge planning Services     Post Acute Care Choice:    Choice offered to:     DME Arranged:    DME Agency:     HH Arranged:    HH Agency:     Status of Service:  In process, will continue to follow  If discussed at Long Length of Stay Meetings, dates discussed:    Additional Comments:  Golda Acre, RN 04/01/2018, 8:59 AM

## 2018-04-11 NOTE — Discharge Summary (Signed)
[facility] Discharge Summary   Patient: Madison Williams [id number]   PCP: Patient, No Pcp Per DOB: [birth year]   Date of admission: 03/30/2018   Date of discharge: 04/01/2018    Discharge Diagnoses:  Active Problems:   DKA (diabetic ketoacidoses) (HCC)   ARF (acute renal failure) (HCC)   Leucocytosis   Dehydration   Tobacco use disorder   Admitted From: home Disposition:  home  Recommendations for Outpatient Follow-up:  1. Please follow up with PCP in 1 week   Follow-up Information    PCP. Schedule an appointment as soon as possible for a visit in 1 week(s).          Diet recommendation: carb modified diet  Activity: The patient is advised to gradually reintroduce usual activities.  Discharge Condition: good  Code Status: full code  History of present illness: As per the H and P dictated on admission, "Madison Williams is a 32 y.o. female with medical history significant of type 1 diabetes, tobacco use disorder who has been having abdominal pain with nausea vomiting for at least 3 days.  Patient is on 70/30 insulin.  She did not use it because she was sick.  She came to the ER with persistent abdominal pain nausea vomiting.  Also generalized weakness.  Patient is dehydrated.  She is retching trying to throw up constantly.  Evaluation showed evidence of diabetic ketoacidosis with acute kidney injury.  Patient has received initial IV fluids in the ER with IV insulin and is being admitted for treatment of diabetic ketoacidosis."  Hospital Course:  Summary of her active problems in the hospital is as following. 1.  DKA. Type 1 diabetes. On 70/30 insulin at home which she did not use due to nausea and vomiting. Presenting with anion gap of 37 bicarb of 8. Glucose 739. Aggressively hydrated as well as also started on IV insulin with glucose stabilizer protocol. Anion gap on BMP has remained closed. Acidosis also improving. Continue home regimen   2.   Persistent nausea and vomiting. Epigastric abdominal pain. Bilateral chest pain. Chest x-ray unremarkable. X-ray abdomen also unremarkable. Likely this is all response to DKA and severe retching. Also place her on Pepcid.  3.  Dehydration with mild acute kidney injury. Resolved  Monitor.  4.  Active smoker. Tobacco counseling as well as nicotine patch.  All other chronic medical condition were stable during the hospitalization.  Patient was ambulatory without any assistance. On the day of the discharge the patient's vitals were stable, and no other acute medical condition were reported by patient. the patient was felt safe to be discharge at home with family.  Consultants: none Procedures: none  DISCHARGE MEDICATION: Allergies as of 04/01/2018   No Known Allergies     Medication List    TAKE these medications   acetaminophen 325 MG tablet Commonly known as:  TYLENOL Take 650 mg by mouth every 6 (six) hours as needed for moderate pain.   albuterol 108 (90 Base) MCG/ACT inhaler Commonly known as:  PROVENTIL HFA;VENTOLIN HFA Inhale 2 puffs into the lungs every 6 (six) hours as needed for wheezing or shortness of breath.   alum & mag hydroxide-simeth 200-200-20 MG/5ML suspension Commonly known as:  MAALOX/MYLANTA Take 15 mLs by mouth every 4 (four) hours as needed for indigestion or heartburn.   famotidine 20 MG tablet Commonly known as:  PEPCID Take 1 tablet (20 mg total) by mouth daily.   ibuprofen 200 MG tablet Commonly known as:  ADVIL,MOTRIN  Take 400 mg by mouth every 6 (six) hours as needed for moderate pain.   insulin NPH-regular Human (70-30) 100 UNIT/ML injection Commonly known as:  NOVOLIN 70/30 Inject 15 Units into the skin 2 (two) times daily with a meal.   nicotine 14 mg/24hr patch Commonly known as:  NICODERM CQ - dosed in mg/24 hours Place 1 patch (14 mg total) onto the skin daily.   pantoprazole 40 MG tablet Commonly known as:  PROTONIX Take 1  tablet (40 mg total) by mouth 2 (two) times daily before a meal for 14 days.   sucralfate 1 GM/10ML suspension Commonly known as:  CARAFATE Take 10 mLs (1 g total) by mouth 4 (four) times daily -  with meals and at bedtime.      No Known Allergies Discharge Instructions    Diet Carb Modified   Complete by:  As directed    Discharge instructions   Complete by:  As directed    It is important that you read following instructions as well as go over your medication list with RN to help you understand your care after this hospitalization.  Discharge Instructions: Please follow-up with PCP in one week  Please request your primary care physician to go over all Hospital Tests and Procedure/Radiological results at the follow up,  Please get all Hospital records sent to your PCP by signing hospital release before you go home.   Do not take more than prescribed Pain, Sleep and Anxiety Medications. You were cared for by a hospitalist during your hospital stay. If you have any questions about your discharge medications or the care you received while you were in the hospital after you are discharged, you can call the unit and ask to speak with the hospitalist on call if the hospitalist that took care of you is not available.  Once you are discharged, your primary care physician will handle any further medical issues. Please note that NO REFILLS for any discharge medications will be authorized once you are discharged, as it is imperative that you return to your primary care physician (or establish a relationship with a primary care physician if you do not have one) for your aftercare needs so that they can reassess your need for medications and monitor your lab values. You Must read complete instructions/literature along with all the possible adverse reactions/side effects for all the Medicines you take and that have been prescribed to you. Take any new Medicines after you have completely understood and  accept all the possible adverse reactions/side effects. Wear Seat belts while driving. If you have smoked or chewed Tobacco in the last 2 yrs please stop smoking and/or stop any Recreational drug use.   Increase activity slowly   Complete by:  As directed      Discharge Exam: Filed Weights   03/30/18 1704  Weight: 59 kg   Vitals:   04/01/18 1130 04/01/18 1200  BP:  (!) 151/94  Pulse:  (!) 101  Resp:  (!) 25  Temp: 98.6 F (37 C)   SpO2:  100%   General: Appear in no distress, no Rash; Oral Mucosa moist. Cardiovascular: S1 and S2 Present, no Murmur, no JVD Respiratory: Bilateral Air entry present and Clear to Auscultation, no Crackles, no wheezes Abdomen: Bowel Sound present, Soft and no tenderness Extremities: no Pedal edema, no calf tenderness Neurology: Grossly no focal neuro deficit.  The results of significant diagnostics from this hospitalization (including imaging, microbiology, ancillary and laboratory) are listed below for reference.  Significant Diagnostic Studies: Dg Chest Port 1 View  Result Date: 03/31/2018 CLINICAL DATA:  Vomiting for 2 days. EXAM: PORTABLE CHEST 1 VIEW COMPARISON:  None. FINDINGS: The lungs are clear. Heart size is normal. No pneumothorax or pleural effusion. No acute or focal bony abnormality. IMPRESSION: No acute disease. Electronically Signed   By: Drusilla Kanner M.D.   On: 03/31/2018 11:00   Dg Abd Portable 1v  Result Date: 03/31/2018 CLINICAL DATA:  Vomiting, abdominal pain EXAM: PORTABLE ABDOMEN - 1 VIEW COMPARISON:  None. FINDINGS: The bowel gas pattern is normal. No radio-opaque calculi or other significant radiographic abnormality are seen. IMPRESSION: Negative. Electronically Signed   By: Judie Petit.  Shick M.D.   On: 03/31/2018 11:02    Microbiology: No results found for this or any previous visit (from the past 240 hour(s)).   Labs: CBC: No results for input(s): WBC, NEUTROABS, HGB, HCT, MCV, PLT in the last 168 hours. Basic Metabolic  Panel: No results for input(s): NA, K, CL, CO2, GLUCOSE, BUN, CREATININE, CALCIUM, MG, PHOS in the last 168 hours. Liver Function Tests: No results for input(s): AST, ALT, ALKPHOS, BILITOT, PROT, ALBUMIN in the last 168 hours. No results for input(s): LIPASE, AMYLASE in the last 168 hours. No results for input(s): AMMONIA in the last 168 hours. Cardiac Enzymes: No results for input(s): CKTOTAL, CKMB, CKMBINDEX, TROPONINI in the last 168 hours. BNP (last 3 results) No results for input(s): BNP in the last 8760 hours. CBG: No results for input(s): GLUCAP in the last 168 hours. Time spent: 35 minutes  Signed:  Lynden Oxford  [facility] 04/01/2018 , 8:23 AM

## 2019-01-30 ENCOUNTER — Inpatient Hospital Stay (HOSPITAL_COMMUNITY)
Admission: EM | Admit: 2019-01-30 | Discharge: 2019-02-03 | DRG: 638 | Disposition: A | Discharge status: Home / Self Care | Payer: Self-pay | Attending: Family Medicine | Admitting: Family Medicine

## 2019-01-30 ENCOUNTER — Inpatient Hospital Stay (HOSPITAL_COMMUNITY): Payer: Self-pay

## 2019-01-30 DIAGNOSIS — F17210 Nicotine dependence, cigarettes, uncomplicated: Secondary | ICD-10-CM | POA: Diagnosis present

## 2019-01-30 DIAGNOSIS — E1010 Type 1 diabetes mellitus with ketoacidosis without coma: Principal | ICD-10-CM | POA: Diagnosis present

## 2019-01-30 DIAGNOSIS — E860 Dehydration: Secondary | ICD-10-CM | POA: Diagnosis present

## 2019-01-30 DIAGNOSIS — E876 Hypokalemia: Secondary | ICD-10-CM | POA: Diagnosis not present

## 2019-01-30 DIAGNOSIS — Z1159 Encounter for screening for other viral diseases: Secondary | ICD-10-CM

## 2019-01-30 DIAGNOSIS — J4520 Mild intermittent asthma, uncomplicated: Secondary | ICD-10-CM

## 2019-01-30 DIAGNOSIS — Z5971 Insufficient health insurance coverage: Secondary | ICD-10-CM

## 2019-01-30 DIAGNOSIS — R9431 Abnormal electrocardiogram [ECG] [EKG]: Secondary | ICD-10-CM | POA: Diagnosis present

## 2019-01-30 DIAGNOSIS — Z5989 Other problems related to housing and economic circumstances: Secondary | ICD-10-CM

## 2019-01-30 DIAGNOSIS — Z79899 Other long term (current) drug therapy: Secondary | ICD-10-CM

## 2019-01-30 DIAGNOSIS — Z598 Other problems related to housing and economic circumstances: Secondary | ICD-10-CM

## 2019-01-30 DIAGNOSIS — N179 Acute kidney failure, unspecified: Secondary | ICD-10-CM | POA: Diagnosis present

## 2019-01-30 DIAGNOSIS — Z791 Long term (current) use of non-steroidal anti-inflammatories (NSAID): Secondary | ICD-10-CM

## 2019-01-30 DIAGNOSIS — E1065 Type 1 diabetes mellitus with hyperglycemia: Secondary | ICD-10-CM | POA: Diagnosis present

## 2019-01-30 DIAGNOSIS — Z794 Long term (current) use of insulin: Secondary | ICD-10-CM

## 2019-01-30 DIAGNOSIS — M069 Rheumatoid arthritis, unspecified: Secondary | ICD-10-CM | POA: Diagnosis present

## 2019-01-30 DIAGNOSIS — R1110 Vomiting, unspecified: Secondary | ICD-10-CM | POA: Diagnosis present

## 2019-01-30 DIAGNOSIS — J45909 Unspecified asthma, uncomplicated: Secondary | ICD-10-CM | POA: Diagnosis present

## 2019-01-30 DIAGNOSIS — E1110 Type 2 diabetes mellitus with ketoacidosis without coma: Secondary | ICD-10-CM | POA: Diagnosis present

## 2019-01-30 DIAGNOSIS — R0789 Other chest pain: Secondary | ICD-10-CM

## 2019-01-30 LAB — BASIC METABOLIC PANEL
Anion gap: 15 (ref 5–15)
Anion gap: 13 (ref 5–15)
BUN: 17 mg/dL (ref 6–20)
BUN: 17 mg/dL (ref 6–20)
CO2: 17 mmol/L — ABNORMAL LOW (ref 22–32)
CO2: 18 mmol/L — ABNORMAL LOW (ref 22–32)
Calcium: 9.4 mg/dL (ref 8.9–10.3)
Calcium: 9.5 mg/dL (ref 8.9–10.3)
Chloride: 108 mmol/L (ref 98–111)
Chloride: 107 mmol/L (ref 98–111)
Creatinine, Ser: 1.17 mg/dL — ABNORMAL HIGH (ref 0.44–1.00)
Creatinine, Ser: 0.97 mg/dL (ref 0.44–1.00)
GFR calc Af Amer: >60 mL/min (ref >60)
GFR calc Af Amer: >60 mL/min (ref >60)
GFR calc non Af Amer: >60 mL/min (ref >60)
GFR calc non Af Amer: >60 mL/min (ref >60)
Glucose, Bld: 217 mg/dL — ABNORMAL HIGH (ref 70–99)
Glucose, Bld: 172 mg/dL — ABNORMAL HIGH (ref 70–99)
Potassium: 3.6 mmol/L (ref 3.5–5.1)
Potassium: 3.6 mmol/L (ref 3.5–5.1)
Sodium: 140 mmol/L (ref 135–145)
Sodium: 138 mmol/L (ref 135–145)

## 2019-01-30 LAB — COMPREHENSIVE METABOLIC PANEL
ALT: 35 U/L (ref 0–44)
AST: 30 U/L (ref 15–41)
Albumin: 5.3 g/dL — ABNORMAL HIGH (ref 3.5–5.0)
Alkaline Phosphatase: 112 U/L (ref 38–126)
Anion gap: 26 — ABNORMAL HIGH (ref 5–15)
BUN: 20 mg/dL (ref 6–20)
CO2: 11 mmol/L — ABNORMAL LOW (ref 22–32)
Calcium: 10.5 mg/dL — ABNORMAL HIGH (ref 8.9–10.3)
Chloride: 97 mmol/L — ABNORMAL LOW (ref 98–111)
Creatinine, Ser: 1.82 mg/dL — ABNORMAL HIGH (ref 0.44–1.00)
GFR calc Af Amer: 42 mL/min — ABNORMAL LOW (ref >60)
GFR calc non Af Amer: 36 mL/min — ABNORMAL LOW (ref >60)
Glucose, Bld: 504 mg/dL (ref 70–99)
Potassium: 5.6 mmol/L — ABNORMAL HIGH (ref 3.5–5.1)
Sodium: 134 mmol/L — ABNORMAL LOW (ref 135–145)
Total Bilirubin: 2.4 mg/dL — ABNORMAL HIGH (ref 0.3–1.2)
Total Protein: 9.3 g/dL — ABNORMAL HIGH (ref 6.5–8.1)

## 2019-01-30 LAB — CBC
HCT: 52 % — ABNORMAL HIGH (ref 36.0–46.0)
Hemoglobin: 16.7 g/dL — ABNORMAL HIGH (ref 12.0–15.0)
MCH: 33.8 pg (ref 26.0–34.0)
MCHC: 32.1 g/dL (ref 30.0–36.0)
MCV: 105.3 fL — ABNORMAL HIGH (ref 80.0–100.0)
Platelets: 291 10*3/uL (ref 150–400)
RBC: 4.94 MIL/uL (ref 3.87–5.11)
RDW: 14 % (ref 11.5–15.5)
WBC: 11.1 10*3/uL — ABNORMAL HIGH (ref 4.0–10.5)
nRBC: 0 % (ref 0.0–0.2)

## 2019-01-30 LAB — CBG MONITORING, ED
Glucose-Capillary: 545 mg/dL (ref 70–99)
Glucose-Capillary: 353 mg/dL — ABNORMAL HIGH (ref 70–99)
Glucose-Capillary: 257 mg/dL — ABNORMAL HIGH (ref 70–99)
Glucose-Capillary: 240 mg/dL — ABNORMAL HIGH (ref 70–99)

## 2019-01-30 LAB — I-STAT BETA HCG BLOOD, ED (MC, WL, AP ONLY): I-stat hCG, quantitative: <5 m[IU]/mL (ref <5)

## 2019-01-30 LAB — TROPONIN I (HIGH SENSITIVITY): Troponin I (High Sensitivity): 5 ng/L (ref <18)

## 2019-01-30 LAB — GLUCOSE, CAPILLARY
Glucose-Capillary: 167 mg/dL — ABNORMAL HIGH (ref 70–99)
Glucose-Capillary: 179 mg/dL — ABNORMAL HIGH (ref 70–99)

## 2019-01-30 LAB — SARS CORONAVIRUS 2 BY RT PCR (HOSPITAL ORDER, PERFORMED IN ~~LOC~~ HOSPITAL LAB): SARS Coronavirus 2: NEGATIVE

## 2019-01-30 LAB — LIPASE, BLOOD: Lipase: 25 U/L (ref 11–51)

## 2019-01-30 MED ORDER — ACETAMINOPHEN 325 MG PO TABS
650.0000 mg | ORAL_TABLET | Freq: Four times a day (QID) | ORAL | Status: DC | PRN
  Administered 2019-02-02: 650 mg via ORAL
  Filled 2019-01-30 (×2): qty 2

## 2019-01-30 MED ORDER — SODIUM CHLORIDE 0.9 % IV BOLUS
1000.0000 mL | Freq: Once | INTRAVENOUS | Status: AC
  Administered 2019-01-30: 1000 mL via INTRAVENOUS

## 2019-01-30 MED ORDER — SODIUM CHLORIDE 0.9 % IV SOLN: INTRAVENOUS | Status: DC

## 2019-01-30 MED ORDER — LIDOCAINE VISCOUS HCL 2 % MT SOLN
15.0000 mL | Freq: Once | OROMUCOSAL | Status: AC
  Administered 2019-01-30: 15 mL via OROMUCOSAL
  Filled 2019-01-30: qty 15

## 2019-01-30 MED ORDER — DEXTROSE-NACL 5-0.45 % IV SOLN: INTRAVENOUS | Status: DC

## 2019-01-30 MED ORDER — PROMETHAZINE HCL 25 MG/ML IJ SOLN
25.0000 mg | Freq: Four times a day (QID) | INTRAMUSCULAR | Status: DC | PRN
  Administered 2019-01-30 – 2019-02-03 (×10): 25 mg via INTRAVENOUS
  Filled 2019-01-30 (×10): qty 1

## 2019-01-30 MED ORDER — NICOTINE 7 MG/24HR TD PT24
7.0000 mg | MEDICATED_PATCH | Freq: Every day | TRANSDERMAL | Status: DC
  Administered 2019-01-30 – 2019-02-02 (×4): 7 mg via TRANSDERMAL
  Filled 2019-01-30 (×5): qty 1

## 2019-01-30 MED ORDER — ONDANSETRON 4 MG PO TBDP
4.0000 mg | ORAL_TABLET | Freq: Once | ORAL | Status: AC
  Administered 2019-01-30: 4 mg via ORAL

## 2019-01-30 MED ORDER — FAMOTIDINE 20 MG PO TABS
20.0000 mg | ORAL_TABLET | Freq: Every day | ORAL | Status: DC
  Administered 2019-01-30 – 2019-02-03 (×5): 20 mg via ORAL
  Filled 2019-01-30 (×5): qty 1

## 2019-01-30 MED ORDER — SODIUM CHLORIDE 0.9% FLUSH: 3.0000 mL | Freq: Once | INTRAVENOUS | Status: DC

## 2019-01-30 MED ORDER — KCL IN DEXTROSE-NACL 20-5-0.45 MEQ/L-%-% IV SOLN
INTRAVENOUS | Status: DC
  Administered 2019-01-30 – 2019-01-31 (×3): via INTRAVENOUS
  Filled 2019-01-30 (×3): qty 1000

## 2019-01-30 MED ORDER — ONDANSETRON 4 MG PO TBDP
ORAL_TABLET | ORAL | Status: AC
  Filled 2019-01-30: qty 1

## 2019-01-30 MED ORDER — DEXTROSE-NACL 5-0.45 % IV SOLN
INTRAVENOUS | Status: DC
  Administered 2019-01-30: 19:00:00 via INTRAVENOUS

## 2019-01-30 MED ORDER — INSULIN REGULAR(HUMAN) IN NACL 100-0.9 UT/100ML-% IV SOLN: INTRAVENOUS | Status: DC

## 2019-01-30 MED ORDER — PANTOPRAZOLE SODIUM 40 MG PO TBEC
40.0000 mg | DELAYED_RELEASE_TABLET | Freq: Two times a day (BID) | ORAL | Status: DC
  Administered 2019-01-31 – 2019-02-03 (×7): 40 mg via ORAL
  Filled 2019-01-30 (×7): qty 1

## 2019-01-30 MED ORDER — INSULIN REGULAR(HUMAN) IN NACL 100-0.9 UT/100ML-% IV SOLN
INTRAVENOUS | Status: DC
  Administered 2019-01-30: 4.9 [IU]/h via INTRAVENOUS
  Filled 2019-01-30: qty 100

## 2019-01-30 MED ORDER — ENOXAPARIN SODIUM 40 MG/0.4ML ~~LOC~~ SOLN
40.0000 mg | SUBCUTANEOUS | Status: DC
  Administered 2019-01-30 – 2019-02-02 (×4): 40 mg via SUBCUTANEOUS
  Filled 2019-01-30 (×4): qty 0.4

## 2019-01-30 MED ORDER — POTASSIUM CHLORIDE 2 MEQ/ML IV SOLN
INTRAVENOUS | Status: DC
  Filled 2019-01-30 (×2): qty 1000

## 2019-01-30 MED ORDER — ALBUTEROL SULFATE (2.5 MG/3ML) 0.083% IN NEBU: 3.0000 mL | INHALATION_SOLUTION | Freq: Four times a day (QID) | RESPIRATORY_TRACT | Status: DC | PRN

## 2019-01-30 NOTE — ED Provider Notes (Signed)
[place] EMERGENCY DEPARTMENT Provider Note   CSN: [id number] Arrival date & time: 01/30/19  1326    History   Chief Complaint Chief Complaint  Patient presents with  . Vomiting    HPI Madison Williams is a 33 y.o. female.     HPI This adult female with multiple medical issues including insulin-dependent diabetes presents with weakness, nausea, vomiting, diffuse discomfort. She was well until yesterday. Now over the past 24 hours she is developed nausea, p.o. intolerance, diffuse soreness, and weakness. No fever, no cough. She notes that she has been taking her insulin regularly, but over the past day has been unable to take any other medication. She notes a history of prior episodes of DKA, states that this current illness feels distinct. Past Medical History:  Diagnosis Date  . Asthma   . Diabetes mellitus without complication ([place])   . Rheumatoid arthritis [facility])     Patient Active Problem List   Diagnosis Date Noted  . DKA (diabetic ketoacidoses) ([place]) 03/30/2018  . ARF (acute renal failure) ([place]) 03/30/2018  . Leucocytosis 03/30/2018  . Dehydration 03/30/2018  . Tobacco use disorder 03/30/2018    Past Surgical History:  Procedure Laterality Date  . LAPAROSCOPIC OVARIAN       OB History   No obstetric history on file.      Home Medications    Prior to Admission medications   Medication Sig Start Date End Date Taking? Authorizing Provider  acetaminophen (TYLENOL) 325 MG tablet Take 650 mg by mouth every 6 (six) hours as needed for moderate pain.   Yes [provider]  albuterol (PROVENTIL HFA;VENTOLIN HFA) 108 (90 Base) MCG/ACT inhaler Inhale 2 puffs into the lungs every 6 (six) hours as needed for wheezing or shortness of breath.   Yes [provider]  alum & mag hydroxide-simeth (MAALOX/MYLANTA) 200-200-20 MG/5ML suspension Take 15 mLs by mouth every 4 (four) hours as needed for indigestion or heartburn. 04/01/18   Yes Lavina Hamman, MD  ibuprofen (ADVIL,MOTRIN) 200 MG tablet Take 400 mg by mouth every 6 (six) hours as needed for moderate pain.   Yes [provider]  insulin NPH-regular Human (NOVOLIN 70/30) (70-30) 100 UNIT/ML injection Inject 20 Units into the skin 2 (two) times daily with a meal.    Yes [provider]  famotidine (PEPCID) 20 MG tablet Take 1 tablet (20 mg total) by mouth daily. 04/01/18 05/01/18  Lavina Hamman, MD  nicotine (NICODERM CQ - DOSED IN MG/24 HOURS) 14 mg/24hr patch Place 1 patch (14 mg total) onto the skin daily. Patient not taking: Reported on 01/30/2019 04/01/18   Lavina Hamman, MD  pantoprazole (PROTONIX) 40 MG tablet Take 1 tablet (40 mg total) by mouth 2 (two) times daily before a meal for 14 days. 04/01/18 04/15/18  Lavina Hamman, MD  sucralfate (CARAFATE) 1 GM/10ML suspension Take 10 mLs (1 g total) by mouth 4 (four) times daily -  with meals and at bedtime. Patient not taking: Reported on 01/30/2019 04/01/18   Lavina Hamman, MD    Family History No family history on file.  Social History Social History   Tobacco Use  . Smoking status: Current Every Day Smoker    Packs/day: 0.50  . Smokeless tobacco: Never Used  Substance Use Topics  . Alcohol use: Yes    Comment: rarely  . Drug use: Never     Allergies   Patient has no known allergies.   Review of  Systems Review of Systems  Constitutional:       Per HPI, otherwise negative  HENT:       Per HPI, otherwise negative  Respiratory:       Per HPI, otherwise negative  Cardiovascular:       Per HPI, otherwise negative  Gastrointestinal: Positive for nausea and vomiting.  Endocrine:       Negative aside from HPI  Genitourinary:       Neg aside from HPI   Musculoskeletal:       Per HPI, otherwise negative  Skin: Negative.   Neurological: Positive for weakness. Negative for syncope.     Physical Exam Updated Vital Signs BP 129/87   Pulse (!) 103   Temp 98.2 F (36.8 C)  (Oral)   Resp (!) 22   SpO2 99%   Physical Exam Vitals signs and nursing note reviewed.  Constitutional:      Comments: Awake and alert adult female diaphoretic, uncomfortable appearing, but speaking clearly.  HENT:     Head: Normocephalic and atraumatic.  Eyes:     Conjunctiva/sclera: Conjunctivae normal.  Cardiovascular:     Rate and Rhythm: Regular rhythm. Tachycardia present.  Pulmonary:     Effort: Tachypnea present. No respiratory distress.     Breath sounds: No stridor. Decreased breath sounds present. No wheezing or rhonchi.  Abdominal:     General: There is no distension.     Comments: Diffuse mild tenderness throughout  Skin:    General: Skin is warm and dry.  Neurological:     Mental Status: She is alert and oriented to person, place, and time.     Cranial Nerves: No cranial nerve deficit.      ED Treatments / Results  Labs (all labs ordered are listed, but only abnormal results are displayed) Labs Reviewed  COMPREHENSIVE METABOLIC PANEL - Abnormal; Notable for the following components:      Result Value   Sodium 134 (*)    Potassium 5.6 (*)    Chloride 97 (*)    CO2 11 (*)    Glucose, Bld 504 (*)    Creatinine, Ser 1.82 (*)    Calcium 10.5 (*)    Total Protein 9.3 (*)    Albumin 5.3 (*)    Total Bilirubin 2.4 (*)    GFR calc non Af Amer 36 (*)    GFR calc Af Amer 42 (*)    Anion gap 26 (*)    All other components within normal limits  CBC - Abnormal; Notable for the following components:   WBC 11.1 (*)    Hemoglobin 16.7 (*)    HCT 52.0 (*)    MCV 105.3 (*)    All other components within normal limits  CBG MONITORING, ED - Abnormal; Notable for the following components:   Glucose-Capillary 545 (*)    All other components within normal limits  CBG MONITORING, ED - Abnormal; Notable for the following components:   Glucose-Capillary 353 (*)    All other components within normal limits  SARS CORONAVIRUS 2 (HOSPITAL ORDER, PERFORMED IN [facility])  LIPASE, BLOOD  URINALYSIS, ROUTINE W REFLEX MICROSCOPIC  I-STAT BETA HCG BLOOD, ED ([facility], [facility], [facility] ONLY)  I-STAT BETA HCG BLOOD, ED ([facility], [facility], [facility] ONLY)   Procedures Procedures (including critical care time)  Medications Ordered in ED Medications  sodium chloride flush (NS) 0.9 % injection 3 mL (3 mLs Intravenous Not Given 01/30/19 1548)  ondansetron (ZOFRAN-ODT) 4 MG disintegrating  tablet (has no administration in time range)  dextrose 5 %-0.45 % sodium chloride infusion (has no administration in time range)  insulin regular, human (MYXREDLIN) 100 units/ 100 mL infusion (2.9 Units/hr Intravenous Rate/Dose Change 01/30/19 1649)  promethazine (PHENERGAN) injection 25 mg (25 mg Intravenous Given 01/30/19 1537)  ondansetron (ZOFRAN-ODT) disintegrating tablet 4 mg (4 mg Oral Given 01/30/19 1409)  sodium chloride 0.9 % bolus 1,000 mL (1,000 mLs Intravenous New Bag/Given 01/30/19 1544)  lidocaine (XYLOCAINE) 2 % viscous mouth solution 15 mL (15 mLs Mouth/Throat Given 01/30/19 1537)     Initial Impression / Assessment and Plan / ED Course  I have reviewed the triage vital signs and the nursing notes.  Pertinent labs & imaging results that were available during my care of the patient were reviewed by me and considered in my medical decision making (see chart for details).    Initial values concerning for glucose greater than 500, acute kidney injury and anion gap of 20.  Patient received fluids empirically, will initiate glucose stabilizer protocol, including continuous insulin.      5:23 PM Patient slightly better in appearance, though she remains tachycardic. Remaining labs consistent with DKA, with acute kidney injury. Patient has received fluids, is on her insulin drip, and glucose has begun to diminish. Given concerns with DKA, need for ongoing monitoring, management, continuous fluids and insulin, the patient will be admitted to SDU.  Final Clinical Impressions(s) / ED Diagnoses    Final diagnoses:  Diabetic ketoacidosis without coma associated with type 1 diabetes mellitus (HCC)  AKI (acute kidney injury) (HCC)   CRITICAL CARE Performed by: Gerhard Munch Total critical care time: 35 minutes Critical care time was exclusive of separately billable procedures and treating other patients. Critical care was necessary to treat or prevent imminent or life-threatening deterioration. Critical care was time spent personally by me on the following activities: development of treatment plan with patient and/or surrogate as well as nursing, discussions with consultants, evaluation of patient's response to treatment, examination of patient, obtaining history from patient or surrogate, ordering and performing treatments and interventions, ordering and review of laboratory studies, ordering and review of radiographic studies, pulse oximetry and re-evaluation of patient's condition.    Gerhard Munch, MD 01/30/19 [phone]

## 2019-01-30 NOTE — H&P (Signed)
[facility] Admission History and Physical Service Pager: [phone]  Patient name: Madison Williams Medical record number: [id number] Date of birth: 01/10/1986 Age: 33 y.o. Gender: female  Primary Care Provider: Patient, No Pcp Per Consultants: n/a Code Status: full Preferred Emergency Contact: will verify  Chief Complaint: DKA  Assessment and Plan: Madison Williams is a 33 y.o. female presenting with DKA 2/2 T1DM . PMH is significant for T1DM, asthma, tobacco abuse  DKA: Patient with known type 1 diabetes, generally takes a sliding scale of approximately 20 units [employer] 70/30 insulin twice daily.  She says she generally has been doing well until she started having some chest tightness and nausea approximately 2 days ago, said blood sugars are usually well controlled in the 100s but for the last 3 days even claiming decreased appetite CBGs were in the 300s, she was having trouble keeping food down and was vomiting regularly.  Denies signs of blood in the vomit.  No claim of fever symptoms.  Denies being without her insulin.  Glucose 504 on admission with anion gap of 26, clinically dehydrated, with AKI and serum creatinine 1.82 -Admit to progressive, Dr. McDiarmid -Insulin GTT, normal saline at @125 /hr, will change to D5 quarter normal saline at CBG less than 250 -BMP every 4 hours -Potassium 5.6 on admission, no potassium replacement at this time -CBGs every hour -We will transition during daytime hours once anion gap is closed  Chest tightness: Patient planing of chest tightness for the last 2 days as part of her constellation of symptoms.  She does have a history of asthma and has been using her albuterol inhaler "all the time" for the last few days.  This could be an indication of a noninfectious asthma exacerbation.  Also concern for cardiac etiology, so EKG was ordered which showed potential hypertrophy and abnormal T wave.  Chest x-ray largely  unremarkable with a potential for infiltrate to my read, radiology read still pending.  Patient was not complaining of any focal pain and was stable on room air during admission exam.  No hormonal birth control on med rec but patient is a smoker which does have a small coagulation risk for PE, but pain is not pleuritic. -A.m. EKG -Troponins ordered, will consult cards if elevated  Asthma: Could be related to chest tightness as above.  No significant respiratory distress on admission exam. -Home albuterol inhaler as needed  Tobacco abuse: smokes 1/2 pack/day, request nicotine patch -7mg  patch  FEN/GI: N.p.o. until anion gap closed and off insulin drip Prophylaxis: Lovenox  Disposition: Home once stabilized  History of Present Illness:  Madison Williams is a 33 y.o. female presenting with increased blood sugar for the last 3 days.  She takes Novolog 70/30 approx 20 units twice a day with CBG 100's-200's.  Appetite decreased with constant vomiting.  She also complains of chest tightness requiring an increase usage of her albuterol.  States that she has not been in contact with anyone else who is sick.  She works at a bar. She states that she is a current smoker about a half pack/day.  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 Denies illicit substances.  In the ED her initial CBG was 545, AG 26, K 2.6 and she was started on insulin and IV fluidsl  Review Of Systems: Per HPI with the following additions:   ROS  Patient Active Problem List   Diagnosis Date Noted  .  DKA (diabetic ketoacidoses) (HCC) 03/30/2018  . ARF (acute renal failure) (H[facility]) 03/30/2018  . Leucocytosis 03/30/2018  . Dehydration 03/30/2018  . Tobacco use disorder 03/30/2018    Past Medical History: Past Medical History:  Diagnosis Date  . Asthma   . Diabetes mellitus without complication ([facility])   . Rheumatoid arthritis [facility])     Past Surgical History: Past  Surgical History:  Procedure Laterality Date  . LAPAROSCOPIC OVARIAN      Social History: Social History   Tobacco Use  . Smoking status: Current Every Day Smoker    Packs/day: 0.50  . Smokeless tobacco: Never Used  Substance Use Topics  . Alcohol use: Yes    Comment: rarely  . Drug use: Never   Additional social history:   Please also refer to relevant sections of EMR.  Family History: No family history on file.   Allergies and Medications: No Known Allergies No current facility-administered medications on file prior to encounter.    Current Outpatient Medications on File Prior to Encounter  Medication Sig Dispense Refill  . acetaminophen (TYLENOL) 325 MG tablet Take 650 mg by mouth every 6 (six) hours as needed for moderate pain.    Marland Kitchen albuterol (PROVENTIL HFA;VENTOLIN HFA) 108 (90 Base) MCG/ACT inhaler Inhale 2 puffs into the lungs every 6 (six) hours as needed for wheezing or shortness of breath.    Marland Kitchen alum & mag hydroxide-simeth (MAALOX/MYLANTA) 200-200-20 MG/5ML suspension Take 15 mLs by mouth every 4 (four) hours as needed for indigestion or heartburn. 355 mL 0  . ibuprofen (ADVIL,MOTRIN) 200 MG tablet Take 400 mg by mouth every 6 (six) hours as needed for moderate pain.    Marland Kitchen insulin NPH-regular Human (NOVOLIN 70/30) (70-30) 100 UNIT/ML injection Inject 20 Units into the skin 2 (two) times daily with a meal.     . famotidine (PEPCID) 20 MG tablet Take 1 tablet (20 mg total) by mouth daily. 30 tablet 0  . nicotine (NICODERM CQ - DOSED IN MG/24 HOURS) 14 mg/24hr patch Place 1 patch (14 mg total) onto the skin daily. (Patient not taking: Reported on 01/30/2019) 28 patch 0  . pantoprazole (PROTONIX) 40 MG tablet Take 1 tablet (40 mg total) by mouth 2 (two) times daily before a meal for 14 days. 28 tablet 0  . sucralfate (CARAFATE) 1 GM/10ML suspension Take 10 mLs (1 g total) by mouth 4 (four) times daily -  with meals and at bedtime. (Patient not taking: Reported on 01/30/2019) 420  mL 0    Objective: BP 133/86   Pulse 86   Temp 100.1 F (37.8 C)   Resp 14   SpO2 100%  Exam: General: tired, alert Eyes: no conjunctivities/drainage Neck: supple/FROM Cardiovascular: 2/6 murmur (new), rrr Respiratory: CTAB, no IWB, no wheezes Gastrointestinal: soft belly, minimally tender, no rebound MSK: Able to ambulate with assistance due to weakness Derm: no lesions to exposed skin, pale Neuro/psych: grossly intact, processing thoughts appropriately  Labs and Imaging: CBC BMET  Recent Labs  Lab 01/30/19 1333  WBC 11.1*  HGB 16.7*  HCT 52.0*  PLT 291   Recent Labs  Lab 01/30/19 1333  NA 134*  K 5.6*  CL 97*  CO2 11*  BUN 20  CREATININE 1.82*  GLUCOSE 504*  CALCIUM 10.5*     EKG: abnormal twave, potential hypertrophy  Marthenia Rolling, DO 01/30/2019, 8:37 PM PGY-3, [facility] FPTS Intern pager: [phone], text pages welcome

## 2019-01-30 NOTE — ED Notes (Signed)
ED TO INPATIENT HANDOFF REPORT  ED Nurse Name and Phone #:  [phone]  S Name/Age/Gender Madison Williams 33 y.o. female Room/Bed: 017C/017C  Code Status   Code Status: Full Code  Home/SNF/Other Home Patient oriented to: self, place, time and situation Is this baseline? Yes   Triage Complete: Triage complete  Chief Complaint vomiting/hx diabetic  Triage Note Pt reports n/v for several days. Pt is type 1 diabetic and reports cbg of 300.    Allergies No Known Allergies  Level of Care/Admitting Diagnosis ED Disposition    ED Disposition Condition Comment   Admit  Hospital Area: [facility] [[id number]]  Level of Care: Progressive [102]  Covid Evaluation: Person Under Investigation (PUI)  Diagnosis: DKA (diabetic ketoacidoses) [facility]) [[id number]]  Admitting Physician: Maryanna Shape  Attending Physician: MCDIARMID, TODD D [[id number]]  Estimated length of stay: past midnight tomorrow  Certification:: I certify this patient will need inpatient services for at least 2 midnights  PT Class (Do Not Modify): Inpatient [101]  PT Acc Code (Do Not Modify): Private [1]       B Medical/Surgery History Past Medical History:  Diagnosis Date  . Asthma   . Diabetes mellitus without complication (HCC)   . Rheumatoid arthritis [facility])    Past Surgical History:  Procedure Laterality Date  . LAPAROSCOPIC OVARIAN       A IV Location/Drains/Wounds Patient Lines/Drains/Airways Status   Active Line/Drains/Airways    Name:   Placement date:   Placement time:   Site:   Days:   Peripheral IV 01/30/19 Right Antecubital   01/30/19    1521    Antecubital   less than 1          Intake/Output Last 24 hours  Intake/Output Summary (Last 24 hours) at 01/30/2019 1922 Last data filed at 01/30/2019 1191 Gross per 24 hour  Intake 1000 ml  Output -  Net 1000 ml    Labs/Imaging Results for orders placed or performed during the hospital encounter of 01/30/19 (from the past  48 hour(s))  Lipase, blood     Status: None   Collection Time: 01/30/19  1:33 PM  Result Value Ref Range   Lipase 25 11 - 51 U/L    Comment: Performed at [facility], [address] [place], [place] [address]  Comprehensive metabolic panel     Status: Abnormal   Collection Time: 01/30/19  1:33 PM  Result Value Ref Range   Sodium 134 (L) 135 - 145 mmol/L   Potassium 5.6 (H) 3.5 - 5.1 mmol/L   Chloride 97 (L) 98 - 111 mmol/L   CO2 11 (L) 22 - 32 mmol/L   Glucose, Bld 504 (HH) 70 - 99 mg/dL    Comment: CRITICAL RESULT CALLED TO, READ BACK BY AND VERIFIED WITH: N.Shanitra Phillippi,RN 1440 01/30/2019 CLARK,S    BUN 20 6 - 20 mg/dL   Creatinine, Ser 5.62 (H) 0.44 - 1.00 mg/dL   Calcium 13.0 (H) 8.9 - 10.3 mg/dL   Total Protein 9.3 (H) 6.5 - 8.1 g/dL   Albumin 5.3 (H) 3.5 - 5.0 g/dL   AST 30 15 - 41 U/L   ALT 35 0 - 44 U/L   Alkaline Phosphatase 112 38 - 126 U/L   Total Bilirubin 2.4 (H) 0.3 - 1.2 mg/dL   GFR calc non Af Amer 36 (L) >60 mL/min   GFR calc Af Amer 42 (L) >60 mL/min   Anion gap 26 (H) 5 - 15  Comment: Performed at [facility], [address], [place], [place] [address]  CBC     Status: Abnormal   Collection Time: 01/30/19  1:33 PM  Result Value Ref Range   WBC 11.1 (H) 4.0 - 10.5 K/uL   RBC 4.94 3.87 - 5.11 MIL/uL   Hemoglobin 16.7 (H) 12.0 - 15.0 g/dL   HCT 23.5 (H) 36.1 - 44.3 %   MCV 105.3 (H) 80.0 - 100.0 fL   MCH 33.8 26.0 - 34.0 pg   MCHC 32.1 30.0 - 36.0 g/dL   RDW 15.4 00.8 - 67.6 %   Platelets 291 150 - 400 K/uL   nRBC 0.0 0.0 - 0.2 %    Comment: Performed at [facility], [address], [place], [place] [address]  I-Stat beta hCG blood, ED     Status: None   Collection Time: 01/30/19  1:52 PM  Result Value Ref Range   I-stat hCG, quantitative <5.0 <5 mIU/mL   Comment 3            Comment:   GEST. AGE      CONC.  (mIU/mL)   <=1 WEEK        5 - 50     2 WEEKS       50 - 500     3 WEEKS       100 - 10,000     4 WEEKS     1,000 - 30,000         FEMALE AND NON-PREGNANT FEMALE:     LESS THAN 5 mIU/mL   POC CBG, ED     Status: Abnormal   Collection Time: 01/30/19  3:39 PM  Result Value Ref Range   Glucose-Capillary 545 (HH) 70 - 99 mg/dL   Comment 1 Document in Chart   CBG monitoring, ED     Status: Abnormal   Collection Time: 01/30/19  4:46 PM  Result Value Ref Range   Glucose-Capillary 353 (H) 70 - 99 mg/dL  SARS Coronavirus 2 (CEPHEID - Performed in [facility] hospital lab), Hosp Order     Status: None   Collection Time: 01/30/19  5:15 PM   Specimen: Nasopharyngeal Swab  Result Value Ref Range   SARS Coronavirus 2 NEGATIVE NEGATIVE    Comment: (NOTE) If result is NEGATIVE SARS-CoV-2 target nucleic acids are NOT DETECTED. The SARS-CoV-2 RNA is generally detectable in upper and lower  respiratory specimens during the acute phase of infection. The lowest  concentration of SARS-CoV-2 viral copies this assay can detect is 250  copies / mL. A negative result does not preclude SARS-CoV-2 infection  and should not be used as the sole basis for treatment or other  patient management decisions.  A negative result may occur with  improper specimen collection / handling, submission of specimen other  than nasopharyngeal swab, presence of viral mutation(s) within the  areas targeted by this assay, and inadequate number of viral copies  (<250 copies / mL). A negative result must be combined with clinical  observations, patient history, and epidemiological information. If result is POSITIVE SARS-CoV-2 target nucleic acids are DETECTED. The SARS-CoV-2 RNA is generally detectable in upper and lower  respiratory specimens dur ing the acute phase of infection.  Positive  results are indicative of active infection with SARS-CoV-2.  Clinical  correlation with patient history and other diagnostic information is  necessary to determine patient infection status.  Positive results do  not rule out bacterial infection  or co-infection with  other viruses. If result is PRESUMPTIVE POSTIVE SARS-CoV-2 nucleic acids MAY BE PRESENT.   A presumptive positive result was obtained on the submitted specimen  and confirmed on repeat testing.  While 2019 novel coronavirus  (SARS-CoV-2) nucleic acids may be present in the submitted sample  additional confirmatory testing may be necessary for epidemiological  and / or clinical management purposes  to differentiate between  SARS-CoV-2 and other Sarbecovirus currently known to infect humans.  If clinically indicated additional testing with an alternate test  methodology [phone]([id number]) is advised. The SARS-CoV-2 RNA is generally  detectable in upper and lower respiratory sp ecimens during the acute  phase of infection. The expected result is Negative. Fact Sheet for Patients:  BoilerBrush.com.cyhttps://www.fda.gov/media/136312/download Fact Sheet for Healthcare Providers: https://pope.com/https://www.fda.gov/media/136313/download This test is not yet approved or cleared by the [place] FDA and has been authorized for detection and/or diagnosis of SARS-CoV-2 by FDA under an Emergency Use Authorization (EUA).  This EUA will remain in effect (meaning this test can be used) for the duration of the COVID-19 declaration under Section 564(b)(1) of the Act, 21 U.S.C. section 360bbb-3(b)(1), unless the authorization is terminated or revoked sooner. Performed at [facility], [address] [place], [place] [address]   CBG monitoring, ED     Status: Abnormal   Collection Time: 01/30/19  5:51 PM  Result Value Ref Range   Glucose-Capillary 257 (H) 70 - 99 mg/dL   Comment 1 Notify RN    Comment 2 Document in Chart   CBG monitoring, ED     Status: Abnormal   Collection Time: 01/30/19  7:03 PM  Result Value Ref Range   Glucose-Capillary 240 (H) 70 - 99 mg/dL   Dg Chest Port 1 View  Result Date: 01/30/2019 CLINICAL DATA:  Nausea and vomiting. EXAM: PORTABLE CHEST 1 VIEW COMPARISON:  None. FINDINGS: 1826 hours. The lungs  are clear without focal pneumonia, edema, pneumothorax or pleural effusion. The cardiopericardial silhouette is within normal limits for size. The visualized bony structures of the thorax are intact. Telemetry leads overlie the chest. IMPRESSION: No active disease. Electronically Signed   By: Kennith CenterEric  Mansell M.D.   On: 01/30/2019 18:46    Pending Labs Unresulted Labs (From admission, onward)    Start     Ordered   02/06/19 0500  Creatinine, serum  (enoxaparin (LOVENOX)    CrCl >/= 30 ml/min)  Weekly,   R    Comments: while on enoxaparin therapy    01/30/19 1807   01/31/19 0500  CBC  Daily,   R     01/30/19 1808   01/30/19 1809  Basic metabolic panel  Now then every 4 hours,   R     01/30/19 1808   01/30/19 1801  Troponin I (High Sensitivity)  STAT Now then every 2 hours,   R    Question Answer Comment  Indication Other   Specify indication chest tightness      01/30/19 1801   01/30/19 1333  Urinalysis, Routine w reflex microscopic  ONCE - STAT,   STAT     01/30/19 1332          Vitals/Pain Today's Vitals   01/30/19 1715 01/30/19 1730 01/30/19 1800 01/30/19 1830  BP: 129/87 132/88 137/88 130/73  Pulse: (!) 103 (!) 105 93 (!) 119  Resp: (!) 22 (!) 27 (!) 27 19  Temp:      TempSrc:      SpO2: 99% 98% 98% 98%  Isolation Precautions Droplet and Contact precautions  Medications Medications  sodium chloride flush (NS) 0.9 % injection 3 mL (3 mLs Intravenous Not Given 01/30/19 1548)  ondansetron (ZOFRAN-ODT) 4 MG disintegrating tablet (has no administration in time range)  promethazine (PHENERGAN) injection 25 mg (25 mg Intravenous Given 01/30/19 1537)  acetaminophen (TYLENOL) tablet 650 mg (has no administration in time range)  famotidine (PEPCID) tablet 20 mg (has no administration in time range)  pantoprazole (PROTONIX) EC tablet 40 mg (has no administration in time range)  albuterol (VENTOLIN HFA) 108 (90 Base) MCG/ACT inhaler 2 puff (has no administration in time range)   0.9 %  sodium chloride infusion (has no administration in time range)  dextrose 5 %-0.45 % sodium chloride infusion ( Intravenous New Bag/Given 01/30/19 1913)  insulin regular, human (MYXREDLIN) 100 units/ 100 mL infusion (has no administration in time range)  enoxaparin (LOVENOX) injection 40 mg (has no administration in time range)  ondansetron (ZOFRAN-ODT) disintegrating tablet 4 mg (4 mg Oral Given 01/30/19 1409)  sodium chloride 0.9 % bolus 1,000 mL (0 mLs Intravenous Stopped 01/30/19 1848)  lidocaine (XYLOCAINE) 2 % viscous mouth solution 15 mL (15 mLs Mouth/Throat Given 01/30/19 1537)    Mobility walks with person assist     Focused Assessments Cardiac Assessment Handoff:  Cardiac Rhythm: Normal sinus rhythm No results found for: CKTOTAL, CKMB, CKMBINDEX, TROPONINI No results found for: DDIMER Does the Patient currently have chest pain? No      R Recommendations: See Admitting Provider Note  Report given to:   Additional Notes:

## 2019-01-30 NOTE — ED Triage Notes (Signed)
Pt reports n/v for several days. Pt is type 1 diabetic and reports cbg of 300.

## 2019-01-30 NOTE — ED Notes (Signed)
Attempted report 

## 2019-01-31 ENCOUNTER — Inpatient Hospital Stay (HOSPITAL_COMMUNITY): Payer: Self-pay

## 2019-01-31 ENCOUNTER — Other Ambulatory Visit: Payer: Self-pay

## 2019-01-31 ENCOUNTER — Encounter (HOSPITAL_COMMUNITY): Payer: Self-pay

## 2019-01-31 DIAGNOSIS — Z598 Other problems related to housing and economic circumstances: Secondary | ICD-10-CM

## 2019-01-31 DIAGNOSIS — Z5989 Other problems related to housing and economic circumstances: Secondary | ICD-10-CM

## 2019-01-31 DIAGNOSIS — E1065 Type 1 diabetes mellitus with hyperglycemia: Secondary | ICD-10-CM | POA: Diagnosis present

## 2019-01-31 DIAGNOSIS — R1110 Vomiting, unspecified: Secondary | ICD-10-CM | POA: Diagnosis present

## 2019-01-31 DIAGNOSIS — I351 Nonrheumatic aortic (valve) insufficiency: Secondary | ICD-10-CM

## 2019-01-31 LAB — BASIC METABOLIC PANEL
Anion gap: 13 (ref 5–15)
Anion gap: 14 (ref 5–15)
Anion gap: 12 (ref 5–15)
Anion gap: 12 (ref 5–15)
Anion gap: 12 (ref 5–15)
Anion gap: 11 (ref 5–15)
BUN: 16 mg/dL (ref 6–20)
BUN: 16 mg/dL (ref 6–20)
BUN: 14 mg/dL (ref 6–20)
BUN: 11 mg/dL (ref 6–20)
BUN: 8 mg/dL (ref 6–20)
BUN: 7 mg/dL (ref 6–20)
CO2: 19 mmol/L — ABNORMAL LOW (ref 22–32)
CO2: 19 mmol/L — ABNORMAL LOW (ref 22–32)
CO2: 18 mmol/L — ABNORMAL LOW (ref 22–32)
CO2: 19 mmol/L — ABNORMAL LOW (ref 22–32)
CO2: 19 mmol/L — ABNORMAL LOW (ref 22–32)
CO2: 20 mmol/L — ABNORMAL LOW (ref 22–32)
Calcium: 9.3 mg/dL (ref 8.9–10.3)
Calcium: 9.2 mg/dL (ref 8.9–10.3)
Calcium: 9.1 mg/dL (ref 8.9–10.3)
Calcium: 9.1 mg/dL (ref 8.9–10.3)
Calcium: 9.5 mg/dL (ref 8.9–10.3)
Calcium: 8.8 mg/dL — ABNORMAL LOW (ref 8.9–10.3)
Chloride: 107 mmol/L (ref 98–111)
Chloride: 105 mmol/L (ref 98–111)
Chloride: 107 mmol/L (ref 98–111)
Chloride: 105 mmol/L (ref 98–111)
Chloride: 103 mmol/L (ref 98–111)
Chloride: 101 mmol/L (ref 98–111)
Creatinine, Ser: 0.87 mg/dL (ref 0.44–1.00)
Creatinine, Ser: 0.88 mg/dL (ref 0.44–1.00)
Creatinine, Ser: 0.83 mg/dL (ref 0.44–1.00)
Creatinine, Ser: 0.68 mg/dL (ref 0.44–1.00)
Creatinine, Ser: 0.88 mg/dL (ref 0.44–1.00)
Creatinine, Ser: 0.71 mg/dL (ref 0.44–1.00)
GFR calc Af Amer: >60 mL/min (ref >60)
GFR calc Af Amer: >60 mL/min (ref >60)
GFR calc Af Amer: >60 mL/min (ref >60)
GFR calc Af Amer: >60 mL/min (ref >60)
GFR calc Af Amer: >60 mL/min (ref >60)
GFR calc Af Amer: >60 mL/min (ref >60)
GFR calc non Af Amer: >60 mL/min (ref >60)
GFR calc non Af Amer: >60 mL/min (ref >60)
GFR calc non Af Amer: >60 mL/min (ref >60)
GFR calc non Af Amer: >60 mL/min (ref >60)
GFR calc non Af Amer: >60 mL/min (ref >60)
GFR calc non Af Amer: >60 mL/min (ref >60)
Glucose, Bld: 159 mg/dL — ABNORMAL HIGH (ref 70–99)
Glucose, Bld: 183 mg/dL — ABNORMAL HIGH (ref 70–99)
Glucose, Bld: 217 mg/dL — ABNORMAL HIGH (ref 70–99)
Glucose, Bld: 232 mg/dL — ABNORMAL HIGH (ref 70–99)
Glucose, Bld: 296 mg/dL — ABNORMAL HIGH (ref 70–99)
Glucose, Bld: 363 mg/dL — ABNORMAL HIGH (ref 70–99)
Potassium: 3.5 mmol/L (ref 3.5–5.1)
Potassium: 3.2 mmol/L — ABNORMAL LOW (ref 3.5–5.1)
Potassium: 4.1 mmol/L (ref 3.5–5.1)
Potassium: 3.9 mmol/L (ref 3.5–5.1)
Potassium: 3.6 mmol/L (ref 3.5–5.1)
Potassium: 3.7 mmol/L (ref 3.5–5.1)
Sodium: 139 mmol/L (ref 135–145)
Sodium: 138 mmol/L (ref 135–145)
Sodium: 137 mmol/L (ref 135–145)
Sodium: 136 mmol/L (ref 135–145)
Sodium: 134 mmol/L — ABNORMAL LOW (ref 135–145)
Sodium: 132 mmol/L — ABNORMAL LOW (ref 135–145)

## 2019-01-31 LAB — URINALYSIS, ROUTINE W REFLEX MICROSCOPIC
Bilirubin Urine: NEGATIVE
Glucose, UA: >=500 mg/dL — AB
Hgb urine dipstick: NEGATIVE
Ketones, ur: 20 mg/dL — AB
Leukocytes,Ua: NEGATIVE
Nitrite: NEGATIVE
Protein, ur: 30 mg/dL — AB
Specific Gravity, Urine: 1.014 (ref 1.005–1.030)
pH: 6 (ref 5.0–8.0)

## 2019-01-31 LAB — CBC
HCT: 41.2 % (ref 36.0–46.0)
Hemoglobin: 14.1 g/dL (ref 12.0–15.0)
MCH: 33.7 pg (ref 26.0–34.0)
MCHC: 34.2 g/dL (ref 30.0–36.0)
MCV: 98.6 fL (ref 80.0–100.0)
Platelets: 217 10*3/uL (ref 150–400)
RBC: 4.18 MIL/uL (ref 3.87–5.11)
RDW: 14.1 % (ref 11.5–15.5)
WBC: 5.6 10*3/uL (ref 4.0–10.5)
nRBC: 0 % (ref 0.0–0.2)

## 2019-01-31 LAB — GLUCOSE, CAPILLARY
Glucose-Capillary: 125 mg/dL — ABNORMAL HIGH (ref 70–99)
Glucose-Capillary: 141 mg/dL — ABNORMAL HIGH (ref 70–99)
Glucose-Capillary: 161 mg/dL — ABNORMAL HIGH (ref 70–99)
Glucose-Capillary: 164 mg/dL — ABNORMAL HIGH (ref 70–99)
Glucose-Capillary: 170 mg/dL — ABNORMAL HIGH (ref 70–99)
Glucose-Capillary: 175 mg/dL — ABNORMAL HIGH (ref 70–99)
Glucose-Capillary: 177 mg/dL — ABNORMAL HIGH (ref 70–99)
Glucose-Capillary: 180 mg/dL — ABNORMAL HIGH (ref 70–99)
Glucose-Capillary: 185 mg/dL — ABNORMAL HIGH (ref 70–99)
Glucose-Capillary: 185 mg/dL — ABNORMAL HIGH (ref 70–99)
Glucose-Capillary: 212 mg/dL — ABNORMAL HIGH (ref 70–99)
Glucose-Capillary: 221 mg/dL — ABNORMAL HIGH (ref 70–99)
Glucose-Capillary: 232 mg/dL — ABNORMAL HIGH (ref 70–99)
Glucose-Capillary: 252 mg/dL — ABNORMAL HIGH (ref 70–99)
Glucose-Capillary: 360 mg/dL — ABNORMAL HIGH (ref 70–99)
Glucose-Capillary: 371 mg/dL — ABNORMAL HIGH (ref 70–99)

## 2019-01-31 LAB — ECHOCARDIOGRAM COMPLETE
Height: 67 in
Weight: 1873.03 oz

## 2019-01-31 LAB — HEMOGLOBIN A1C
Hgb A1c MFr Bld: 10.4 % — ABNORMAL HIGH (ref 4.8–5.6)
Mean Plasma Glucose: 251.78 mg/dL

## 2019-01-31 MED ORDER — INSULIN GLARGINE 100 UNIT/ML ~~LOC~~ SOLN: 5.0000 [IU] | Freq: Every day | SUBCUTANEOUS | Status: DC

## 2019-01-31 MED ORDER — INSULIN GLARGINE 100 UNIT/ML ~~LOC~~ SOLN
20.0000 [IU] | Freq: Every day | SUBCUTANEOUS | Status: DC
  Administered 2019-01-31: 20 [IU] via SUBCUTANEOUS
  Filled 2019-01-31 (×2): qty 0.2

## 2019-01-31 MED ORDER — POTASSIUM CHLORIDE 10 MEQ/100ML IV SOLN
10.0000 meq | INTRAVENOUS | Status: AC
  Administered 2019-01-31 (×4): 10 meq via INTRAVENOUS
  Filled 2019-01-31 (×4): qty 100

## 2019-01-31 MED ORDER — ALUM & MAG HYDROXIDE-SIMETH 200-200-20 MG/5ML PO SUSP
15.0000 mL | ORAL | Status: AC | PRN
  Administered 2019-01-31: 15 mL via ORAL
  Filled 2019-01-31: qty 30

## 2019-01-31 MED ORDER — PHENOL 1.4 % MT LIQD: 1.0000 | OROMUCOSAL | Status: AC | PRN

## 2019-01-31 MED ORDER — INSULIN ASPART 100 UNIT/ML ~~LOC~~ SOLN
0.0000 [IU] | Freq: Every day | SUBCUTANEOUS | Status: DC
  Administered 2019-01-31: 5 [IU] via SUBCUTANEOUS
  Administered 2019-02-01: 2 [IU] via SUBCUTANEOUS

## 2019-01-31 MED ORDER — INSULIN ASPART 100 UNIT/ML ~~LOC~~ SOLN
3.0000 [IU] | Freq: Three times a day (TID) | SUBCUTANEOUS | Status: DC
  Administered 2019-01-31 – 2019-02-03 (×9): 3 [IU] via SUBCUTANEOUS

## 2019-01-31 MED ORDER — INSULIN ASPART 100 UNIT/ML ~~LOC~~ SOLN
0.0000 [IU] | Freq: Three times a day (TID) | SUBCUTANEOUS | Status: DC
  Administered 2019-01-31: 9 [IU] via SUBCUTANEOUS
  Administered 2019-02-01: 3 [IU] via SUBCUTANEOUS
  Administered 2019-02-01: 7 [IU] via SUBCUTANEOUS
  Administered 2019-02-01: 5 [IU] via SUBCUTANEOUS
  Administered 2019-02-02: 2 [IU] via SUBCUTANEOUS
  Administered 2019-02-02: 13:00:00 5 [IU] via SUBCUTANEOUS

## 2019-01-31 NOTE — Progress Notes (Signed)
Inpatient Diabetes Program Recommendations  AACE/ADA: New Consensus Statement on Inpatient Glycemic Control (2015)  Target Ranges:  Prepandial:   less than 140 mg/dL      Peak postprandial:   less than 180 mg/dL (1-2 hours)      Critically ill patients:  140 - 180 mg/dL   Lab Results  Component Value Date   GLUCAP 252 (H) 01/31/2019   HGBA1C 10.4 (H) 01/31/2019    Review of Glycemic Control Results for TIFFAY, PINETTE (MRN [id number]) as of 01/31/2019 15:31  Ref. Range 01/31/2019 12:08 01/31/2019 13:09 01/31/2019 14:21  Glucose-Capillary Latest Ref Range: 70 - 99 mg/dL 221 (H) 232 (H) 252 (H)   Spoke with patient regarding outpatient diabetes management. Patient verifies home dosages of 70/30 20 units BID, denies missing doses, and reports checking blood sugars 3-4 times per day. She states that once she started vomiting, she skipped a dose and "got into trouble". She also states she is interested in an insulin pump, but does not have insurance. Reviewed patient's current A1c of 10.4%. Explained what a A1c is and what it measures. Also reviewed goal A1c with patient, importance of good glucose control @ home, and blood sugar goals. Reviewed patho of Dm, need for additional insulin, role of pancreas, vascular changes and comorbidites.  Patient has a meter and supplies. Patient reports her glucose is higher in the afternoons. Education provided and encouraged checking blood sugars 2 hours following meal. Discussed differences between short acting vs long acting insulin, the differences of 70/30 comparitively and when to call MD.  Discussed the importance of following up with PCP, shared information about CH&W, sick day rules and discussed VGo as a possible solution, but that she would need to check with the pharmacy and discuss with MD. Also, discussed transition off insulin drip and that trends may increase this afternoon. Will plan to follow patient this weekend for additional recommendations.    @1400 - Discussed discontinuation of IV insulin drip with Dr Ouida Sills. MD to place lantus orders. RN notified. Education provided.   Thanks, Bronson Curb, MSN, RNC-OB Diabetes Coordinator [phone] (8a-5p)

## 2019-01-31 NOTE — Progress Notes (Signed)
Initial Nutrition Assessment RD working remotely.  DOCUMENTATION CODES:   Underweight  INTERVENTION:   Diet education provided, reviewed carbohydrate counting. Patient has no questions.  Diet advancement as anion gap closes per MD.  NUTRITION DIAGNOSIS:   Inadequate oral intake related to acute illness(DKA) as evidenced by NPO status.  GOAL:   Patient will meet greater than or equal to 90% of their needs  MONITOR:   Diet advancement, PO intake, Labs  REASON FOR ASSESSMENT:   Consult Diet education, Assessment of nutrition requirement/status  ASSESSMENT:   33 yo female admitted with DKA due to viral gastroenteritis. PMH includes type 1 DM, asthma, tobacco abuse.   Spoke with patient over the phone. She reports usual weight ~120-125 lbs. Currently 116.8 lbs. She has lost weight due to vomiting PTA. Gastroenteritis has improved and patient is very hungry.    Reviewed carbohydrate counting guidelines with patient. She says she is very familiar with carbohydrate counting and has no questions. She typically doses her insulin based on how many carbohydrates she eats.   Labs reviewed. Potassium 3.2 (L) CBG's: 170-180-185-177  Medications reviewed and include IV insulin, KCl.  IVF: D5 1/2 NS with KCl at 125 ml/h  NUTRITION - FOCUSED PHYSICAL EXAM:  unable to complete  Diet Order:   Diet Order            Diet NPO time specified  Diet effective now              EDUCATION NEEDS:   Education needs have been addressed  Skin:  Skin Assessment: Reviewed RN Assessment  Last BM:  no BM documented  Height:   Ht Readings from Last 1 Encounters:  01/30/19 5\' 7"  (1.702 m)    Weight:   Wt Readings from Last 1 Encounters:  01/30/19 53.1 kg    Ideal Body Weight:  61.4 kg  BMI:  Body mass index is 18.33 kg/m.  Estimated Nutritional Needs:   Kcal:  1750-2000  Protein:  75-90 gm  Fluid:  >/= 1.7 L    Molli Barrows, RD, LDN, [place] Pager  [phone] After Hours Pager [phone]

## 2019-01-31 NOTE — Progress Notes (Signed)
Dr. Ermalene Searing called and let me know to stop the insulin drip.  I consulted DM coordinator and DKA protocol.  Per DM coordinator, pt may need basal insulin coverage prior to stopping drip.  I called and let Dr. Ouida Sills know this, she does not want basal insulin (pt take 20 units of 70/30 bid at home) at this time and she still wants insulin drip stopped.

## 2019-01-31 NOTE — Progress Notes (Signed)
Family Medicine Teaching Service Daily Progress Note Intern Pager: [phone]  Patient name: Madison Williams Medical record number: [id number] Date of birth: 1985-12-16 Age: 33 y.o. Gender: female  Primary Care Provider: Patient, No Pcp Per Consultants: none Code Status: full  Pt Overview and Major Events to Date:  Presented with T1DM in DKA 7/2 7/3 probable transition from insulin gtt to subq  Assessment and Plan:  T1DM in DKA- improved. Likely precipitated by viral gastritis which is resolved per patient. Anion gap still 14 (26 on admission). Glucose ran 125-185 this am (504 on admission). - continue insulin drip (1.2u/hr currently) - continue IV fluids 124ml/hr NS and D5HNS +KCl 70mEq - continue q1gr CBGs - continue BMP q4hr until gap closes, then will transition to PO diet - frequent neuro checks - CSM consult for outpatient medication management, appreciate recs - nutrition consult - plan to transition to subq insulin once anion gap <13. Hopefully around lunch time  Chest tightness- ECG abnormal. Normal rate and rhythm. There is poor r wave progression, but no q waves, notched p waves, and prolonged QTc 488. Patient is hemodynamically stable with negative troponin. Likely component of misplaced leads for ECG findings and side effect of acute illness. but the notched p waves are consistent so may have enlarged atria or aberrant atrial current. - continue to monitor vitals closely - daily ECG  Asthma - continue albuterol PRN  Tobacco abuse disorder - continue nicotine patch  FEN/GI: IV insulin and D5HNS per above PPx: lovenox  Disposition: med-surg until DKA resolved  Subjective:  Patient feels improved today. She is tired- poor sleep overnight. Denies chest pain, abdominal pain, N/V. Endorses mild SOB which is improved since admission. She is hungry and wants to try to eat today. She has not voided since arriving on the floor but feels like she could go now. She is  agreeable to the plan of keeping her NPO until gap closes and to work with CSM/CSW to find resources for alternate DM treatment outpatient given she is not insured.  Objective: Temp:  [98.1 F (36.7 C)-100.1 F (37.8 C)] 98.2 F (36.8 C) (07/03 0502) Pulse Rate:  [77-119] 77 (07/03 0502) Resp:  [14-29] 21 (07/03 0502) BP: (121-158)/(73-101) 121/83 (07/03 0502) SpO2:  [98 %-100 %] 98 % (07/03 0502) Weight:  [53.1 kg] 53.1 kg (07/02 2152) Physical Exam: General: sleeping but easily aroused and answering all questions appropriately.  Cardiovascular: RRR, no murmur appreciated Respiratory: CTAB, no increased WOB Abdomen: soft, scaphoid, non-tender to palpation Neuro: full strength in all limbs. No focal weakness appreciated. Alert and oriented. PERRL bilateral.  Extremities: moving all appropriately, no edema  Laboratory: Recent Labs  Lab 01/30/19 1333 01/31/19 0241  WBC 11.1* 5.6  HGB 16.7* 14.1  HCT 52.0* 41.2  PLT 291 217   Recent Labs  Lab 01/30/19 1333  01/30/19 2150 01/31/19 0241 01/31/19 0551  NA 134*   < > 138 139 138  K 5.6*   < > 3.6 3.5 3.2*  CL 97*   < > 107 107 105  CO2 11*   < > 18* 19* 19*  BUN 20   < > 17 16 16   CREATININE 1.82*   < > 0.97 0.87 0.88  CALCIUM 10.5*   < > 9.5 9.3 9.2  PROT 9.3*  --   --   --   --   BILITOT 2.4*  --   --   --   --   ALKPHOS 112  --   --   --   --  ALT 35  --   --   --   --   AST 30  --   --   --   --   GLUCOSE 504*   < > 172* 159* 183*   < > = values in this interval not displayed.    HS troponin 5 Lipase 25  Imaging/Diagnostic Tests: Dg Chest Port 1 View  Result Date: 01/30/2019 CLINICAL DATA:  Nausea and vomiting. EXAM: PORTABLE CHEST 1 VIEW COMPARISON:  None. FINDINGS: 1826 hours. The lungs are clear without focal pneumonia, edema, pneumothorax or pleural effusion. The cardiopericardial silhouette is within normal limits for size. The visualized bony structures of the thorax are intact. Telemetry leads overlie the  chest. IMPRESSION: No active disease. Electronically Signed   By: Kennith Center M.D.   On: 01/30/2019 18:46    Leeroy Bock, DO 01/31/2019, 7:51 AM PGY-2, [facility] FPTS Intern pager: [phone], text pages welcome

## 2019-01-31 NOTE — Progress Notes (Signed)
I called nurse to inform that I had put in order for diet and transition patient to subq insulin. Asked her to start the protocol to discontinue the insulin drip. Asked if she had any further questions or needed any further orders from me. She denied needing anything more at that time. Received call from diabetes coordinator in regards to lantus dose- I agreed with her recommendation and changed dose to 20units.  Will continue to monitor progress. Plan to arrange alternate outpatient regimen for patient per resources with assistance of CSM/CSW and would like to avoid 70/30 if possible.

## 2019-01-31 NOTE — Progress Notes (Signed)
Inpatient Diabetes Program Recommendations  AACE/ADA: New Consensus Statement on Inpatient Glycemic Control (2015)  Target Ranges:  Prepandial:   less than 140 mg/dL      Peak postprandial:   less than 180 mg/dL (1-2 hours)      Critically ill patients:  140 - 180 mg/dL   Lab Results  Component Value Date   GLUCAP 180 (H) 01/31/2019   HGBA1C 10.3 (H) 04/01/2018    Review of Glycemic Control Results for Madison Williams, Madison Williams (MRN [id number]) as of 01/31/2019 09:35  Ref. Range 01/31/2019 06:23 01/31/2019 07:37 01/31/2019 08:49  Glucose-Capillary Latest Ref Range: 70 - 99 mg/dL 185 (H) 170 (H) 180 (H)   Diabetes history: Type 1DM Outpatient Diabetes medications:Novolin 70/30 20 units BID  Current orders for Inpatient glycemic control: IV insulin  Inpatient Diabetes Program Recommendations:    When ready to transition, consider:  - Lantus 20 units two hours prior to discontinuation of insulin drip, then QD to follow.  - Novolog 0-9 units TID & Hs.  - Once diet order placed, Novolog 3 units TID (assuming patient is consuming >50% of meal).  - A1C? Last result was from 2019.  Noted CM consult placed for PCP follow up. Attempted to speak with patient, will try again.  Thanks, Bronson Curb, MSN, RNC-OB Diabetes Coordinator [phone] (8a-5p)

## 2019-02-01 DIAGNOSIS — R1110 Vomiting, unspecified: Secondary | ICD-10-CM

## 2019-02-01 DIAGNOSIS — Z598 Other problems related to housing and economic circumstances: Secondary | ICD-10-CM

## 2019-02-01 DIAGNOSIS — N179 Acute kidney failure, unspecified: Secondary | ICD-10-CM

## 2019-02-01 DIAGNOSIS — E860 Dehydration: Secondary | ICD-10-CM

## 2019-02-01 LAB — BASIC METABOLIC PANEL
Anion gap: 9 (ref 5–15)
Anion gap: 13 (ref 5–15)
Anion gap: 10 (ref 5–15)
Anion gap: 12 (ref 5–15)
BUN: 6 mg/dL (ref 6–20)
BUN: 7 mg/dL (ref 6–20)
BUN: 9 mg/dL (ref 6–20)
BUN: 12 mg/dL (ref 6–20)
CO2: 23 mmol/L (ref 22–32)
CO2: 21 mmol/L — ABNORMAL LOW (ref 22–32)
CO2: 23 mmol/L (ref 22–32)
CO2: 20 mmol/L — ABNORMAL LOW (ref 22–32)
Calcium: 8.9 mg/dL (ref 8.9–10.3)
Calcium: 9.1 mg/dL (ref 8.9–10.3)
Calcium: 9 mg/dL (ref 8.9–10.3)
Calcium: 9 mg/dL (ref 8.9–10.3)
Chloride: 104 mmol/L (ref 98–111)
Chloride: 100 mmol/L (ref 98–111)
Chloride: 100 mmol/L (ref 98–111)
Chloride: 100 mmol/L (ref 98–111)
Creatinine, Ser: 0.65 mg/dL (ref 0.44–1.00)
Creatinine, Ser: 0.71 mg/dL (ref 0.44–1.00)
Creatinine, Ser: 0.74 mg/dL (ref 0.44–1.00)
Creatinine, Ser: 0.77 mg/dL (ref 0.44–1.00)
GFR calc Af Amer: >60 mL/min (ref >60)
GFR calc Af Amer: >60 mL/min (ref >60)
GFR calc Af Amer: >60 mL/min (ref >60)
GFR calc Af Amer: >60 mL/min (ref >60)
GFR calc non Af Amer: >60 mL/min (ref >60)
GFR calc non Af Amer: >60 mL/min (ref >60)
GFR calc non Af Amer: >60 mL/min (ref >60)
GFR calc non Af Amer: >60 mL/min (ref >60)
Glucose, Bld: 214 mg/dL — ABNORMAL HIGH (ref 70–99)
Glucose, Bld: 310 mg/dL — ABNORMAL HIGH (ref 70–99)
Glucose, Bld: 373 mg/dL — ABNORMAL HIGH (ref 70–99)
Glucose, Bld: 344 mg/dL — ABNORMAL HIGH (ref 70–99)
Potassium: 3.4 mmol/L — ABNORMAL LOW (ref 3.5–5.1)
Potassium: 3.8 mmol/L (ref 3.5–5.1)
Potassium: 3.9 mmol/L (ref 3.5–5.1)
Potassium: 4.1 mmol/L (ref 3.5–5.1)
Sodium: 136 mmol/L (ref 135–145)
Sodium: 134 mmol/L — ABNORMAL LOW (ref 135–145)
Sodium: 133 mmol/L — ABNORMAL LOW (ref 135–145)
Sodium: 132 mmol/L — ABNORMAL LOW (ref 135–145)

## 2019-02-01 LAB — CBC
HCT: 40.4 % (ref 36.0–46.0)
Hemoglobin: 13.8 g/dL (ref 12.0–15.0)
MCH: 34.1 pg — ABNORMAL HIGH (ref 26.0–34.0)
MCHC: 34.2 g/dL (ref 30.0–36.0)
MCV: 99.8 fL (ref 80.0–100.0)
Platelets: 166 10*3/uL (ref 150–400)
RBC: 4.05 MIL/uL (ref 3.87–5.11)
RDW: 14.1 % (ref 11.5–15.5)
WBC: 4.6 10*3/uL (ref 4.0–10.5)
nRBC: 0 % (ref 0.0–0.2)

## 2019-02-01 LAB — GLUCOSE, CAPILLARY
Glucose-Capillary: 309 mg/dL — ABNORMAL HIGH (ref 70–99)
Glucose-Capillary: 299 mg/dL — ABNORMAL HIGH (ref 70–99)
Glucose-Capillary: 235 mg/dL — ABNORMAL HIGH (ref 70–99)
Glucose-Capillary: 203 mg/dL — ABNORMAL HIGH (ref 70–99)

## 2019-02-01 MED ORDER — POTASSIUM CHLORIDE CRYS ER 20 MEQ PO TBCR
30.0000 meq | EXTENDED_RELEASE_TABLET | Freq: Once | ORAL | Status: AC
  Administered 2019-02-01: 30 meq via ORAL
  Filled 2019-02-01: qty 1

## 2019-02-01 MED ORDER — PNEUMOCOCCAL VAC POLYVALENT 25 MCG/0.5ML IJ INJ
0.5000 mL | INJECTION | INTRAMUSCULAR | Status: DC
  Filled 2019-02-01: qty 0.5

## 2019-02-01 MED ORDER — INSULIN GLARGINE 100 UNIT/ML ~~LOC~~ SOLN
28.0000 [IU] | Freq: Every day | SUBCUTANEOUS | Status: DC
  Administered 2019-02-01: 28 [IU] via SUBCUTANEOUS
  Filled 2019-02-01 (×2): qty 0.28

## 2019-02-01 NOTE — Progress Notes (Addendum)
Family Medicine Teaching Service Daily Progress Note Intern Pager: [phone]  Patient name: Madison Williams Medical record number: [id number] Date of birth: [birth year] Age: 33 y.o. Gender: female  Primary Care Provider: Patient, No Pcp Per Consultants: none Code Status: full  Pt Overview and Major Events to Date:  Presented with T1DM in DKA 7/2 7/3 probable transition from insulin gtt to subq  Assessment and Plan:  T1DM (DKA resolved) improved. Likely precipitated by viral gastritis which is resolved per patient. Anion gap now resolved 9 (26 on admission). Glucose improving 214 this morning. (yesterday 363, 504 on admission).  -Discontinued IV fluids 153ml/hr NS and D5HNS +KCl -28 units of Lantus subc from 20 units yesterday. -Continue Novolog 0-9 Units TID and Hs.  -CBGs at meals and at night  - Full diet instead of broth yesterday  -Case management consult for outpatient medication management to help patient  -Nutrition consult -Diabetes coordinator management  Hypokalaemia K 3.4 this morning secondary to insulin use -Kdur 30 given   Chest tightness-resolved  ECG abnormal. Normal rate and rhythm. There is poor r wave progression, but no q waves, notched p waves, and prolonged QTc 488. Patient is hemodynamically stable with negative troponin. Likely component of misplaced leads for ECG findings and side effect of acute illness. but the notched p waves are consistent so may have enlarged atria or aberrant atrial current. -Echo-LVEF 55-60%, normal wall thickness, normal wall motion, normal diastolic function. No cardiac for chest pain found - Continue to monitor vitals closely  Asthma Stable - continue albuterol PRN  Tobacco abuse disorder - continue nicotine patch  FEN/GI: Diabetic diet PPx: lovenox  Give pneumovax Call [facility] pharmacy for supply for Lantus on discharge.  Disposition: med-surg until DKA resolved  Subjective:  Patient sat up  in bed. Reports not sleeping well at night due to generally feeling well and nauseous. Nausea has been improving with prochloperazine but still present. No vomiting overnight. Feels a little thirsty but largely improved since admission. Has had abdominal pain and bilaterally lumbar pain-worse on the right. She suspects it is due to retching and vomiting on admission. Overall feels better compared to yesterday. Denies chest pain or dyspnea. Feels very hungry at wants to eat a carbohydrate rich meal. She had sugar free jello and chicken broth last night. Does not feel ready to go home and would like to stay in hospital until fully recovered.    Objective: Temp:  [97.7 F (36.5 C)-98.6 F (37 C)] 97.9 F (36.6 C) (07/03 2300) Pulse Rate:  [78-80] 80 (07/03 1700) Resp:  [24] 24 (07/03 1700) BP: (130-139)/(90-91) 136/90 (07/03 1500) SpO2:  [100 %] 100 % (07/03 1700)   Physical Exam: General: Alert and cooperative and appears to be in no acute distress  Cardio: Normal S1 and S2, no S3 or S4. Rhythm is regular. No murmurs or rubs.   Pulm: Clear to auscultation bilaterally, no crackles, wheezing, or diminished breath sounds. Normal respiratory effort Abdomen: Bowel sounds normal. Abdomen soft, non distended, generally tender, no guarding. Tenderness bilaterally in lumbar region. Extremities: No peripheral edema. Warm/ well perfused.  Strong radial and pedal pulses. Neuro: Cranial nerves grossly intact  Laboratory: Recent Labs  Lab 01/30/19 1333 01/31/19 0241  WBC 11.1* 5.6  HGB 16.7* 14.1  HCT 52.0* 41.2  PLT 291 217   Recent Labs  Lab 01/30/19 1333  01/31/19 1830 01/31/19 2149 02/01/19 0203  NA 134*   < > 134* 132* 136  K 5.6*   < >  3.6 3.7 3.4*  CL 97*   < > 103 101 104  CO2 11*   < > 19* 20* 23  BUN 20   < > 8 7 6   CREATININE 1.82*   < > 0.88 0.71 0.65  CALCIUM 10.5*   < > 9.5 8.8* 8.9  PROT 9.3*  --   --   --   --   BILITOT 2.4*  --   --   --   --   ALKPHOS 112  --   --    --   --   ALT 35  --   --   --   --   AST 30  --   --   --   --   GLUCOSE 504*   < > 296* 363* 214*   < > = values in this interval not displayed.    HS troponin 5 Lipase 25  Imaging/Diagnostic Tests: No results found.   Lattie Haw, MD 02/01/2019, 5:38 AM PGY-1, [place] Intern pager: [phone], text pages welcome

## 2019-02-02 DIAGNOSIS — E1010 Type 1 diabetes mellitus with ketoacidosis without coma: Principal | ICD-10-CM

## 2019-02-02 DIAGNOSIS — E1065 Type 1 diabetes mellitus with hyperglycemia: Secondary | ICD-10-CM

## 2019-02-02 DIAGNOSIS — J4520 Mild intermittent asthma, uncomplicated: Secondary | ICD-10-CM

## 2019-02-02 DIAGNOSIS — E0810 Diabetes mellitus due to underlying condition with ketoacidosis without coma: Secondary | ICD-10-CM

## 2019-02-02 DIAGNOSIS — R0789 Other chest pain: Secondary | ICD-10-CM

## 2019-02-02 LAB — CBC
HCT: 36.8 % (ref 36.0–46.0)
Hemoglobin: 12.4 g/dL (ref 12.0–15.0)
MCH: 34 pg (ref 26.0–34.0)
MCHC: 33.7 g/dL (ref 30.0–36.0)
MCV: 100.8 fL — ABNORMAL HIGH (ref 80.0–100.0)
Platelets: 136 10*3/uL — ABNORMAL LOW (ref 150–400)
RBC: 3.65 MIL/uL — ABNORMAL LOW (ref 3.87–5.11)
RDW: 13.9 % (ref 11.5–15.5)
WBC: 3.9 10*3/uL — ABNORMAL LOW (ref 4.0–10.5)
nRBC: 0 % (ref 0.0–0.2)

## 2019-02-02 LAB — GLUCOSE, CAPILLARY
Glucose-Capillary: 191 mg/dL — ABNORMAL HIGH (ref 70–99)
Glucose-Capillary: 269 mg/dL — ABNORMAL HIGH (ref 70–99)
Glucose-Capillary: 309 mg/dL — ABNORMAL HIGH (ref 70–99)

## 2019-02-02 MED ORDER — INSULIN ASPART 100 UNIT/ML ~~LOC~~ SOLN
0.0000 [IU] | Freq: Every day | SUBCUTANEOUS | Status: DC
  Administered 2019-02-03: 3 [IU] via SUBCUTANEOUS

## 2019-02-02 MED ORDER — INSULIN ASPART 100 UNIT/ML ~~LOC~~ SOLN: 3.0000 [IU] | Freq: Three times a day (TID) | SUBCUTANEOUS | Status: DC

## 2019-02-02 MED ORDER — INSULIN GLARGINE 100 UNIT/ML ~~LOC~~ SOLN
30.0000 [IU] | Freq: Every day | SUBCUTANEOUS | Status: DC
  Administered 2019-02-02: 30 [IU] via SUBCUTANEOUS
  Filled 2019-02-02 (×2): qty 0.3

## 2019-02-02 MED ORDER — INSULIN ASPART 100 UNIT/ML ~~LOC~~ SOLN
0.0000 [IU] | Freq: Three times a day (TID) | SUBCUTANEOUS | Status: DC
  Administered 2019-02-02: 7 [IU] via SUBCUTANEOUS
  Administered 2019-02-03: 3 [IU] via SUBCUTANEOUS
  Administered 2019-02-03: 5 [IU] via SUBCUTANEOUS

## 2019-02-02 NOTE — Discharge Summary (Signed)
[facility] Discharge Summary  Patient name: Madison Williams Medical record number: [id number] Date of birth: [birth year] Age: 33 y.o. Gender: female Date of Admission: 01/30/2019  Date of Discharge: 02/02/2019 Admitting Physician: Blane Ohara McDiarmid, MD  Primary Care Provider: Patient, No Pcp Per Consultants: none  Indication for Hospitalization: DKA  Discharge Diagnoses/Problem List:  T1DM (DKA resolved) Chest pain (resolved)  Disposition: discharge home  Discharge Condition: improved  Discharge Exam:  General: NAD, pleasant, able to participate in exam Cardiac: RRR, normal heart sounds, no murmurs. 2+ radial and PT pulses bilaterally Respiratory: CTAB, normal effort, No wheezes, rales or rhonchi Abdomen: soft, nontender, nondistended, no hepatic or splenomegaly, +BS Extremities: no edema or cyanosis. WWP. Skin: warm and dry, no rashes noted Neuro: alert and oriented x4, no focal deficits Psych: Normal affect and mood  Brief Hospital Course:  Admitted with moderate DKA likely precipitated by viral illness. Patient used 70/30 from [place] outpatient without regular PCP care 2/2 no insurance.  DKA- patient stable with initial glucose 504 and anion gap 26 which quickly improved with insulin drip overnight. Transitioned to PO and subq insulin the next afternoon and was able to tolerate a regular diet. On day of discharge, patient had well controlled BGs and was taking 35 units lantus plus novolog sliding scale. CSW was consulted to assist in providing patient with medication for discharge and resources to continue getting the medications outpatient.   Additionally, patient had chest pain on admission and ACS was ruled out with negative high sensitivity troponin and ECG without ischemic changes. Her symptoms resolved during her treatment without repeat.   Issues for Follow Up:  1. Will need PCP follow up for diabetes management and education. May need to  adjust insulin.   Significant Procedures: none  Significant Labs and Imaging:  Hgb A1c- 10.4 c-peptide 0.2  Results/Tests Pending at Time of Discharge: none  Discharge Medications:  Allergies as of 02/03/2019   No Known Allergies     Medication List    STOP taking these medications   insulin NPH-regular Human (70-30) 100 UNIT/ML injection   pantoprazole 40 MG tablet Commonly known as: PROTONIX   sucralfate 1 GM/10ML suspension Commonly known as: CARAFATE     TAKE these medications   acetaminophen 325 MG tablet Commonly known as: TYLENOL Take 650 mg by mouth every 6 (six) hours as needed for moderate pain.   albuterol 108 (90 Base) MCG/ACT inhaler Commonly known as: VENTOLIN HFA Inhale 2 puffs into the lungs every 6 (six) hours as needed for wheezing or shortness of breath.   alum & mag hydroxide-simeth 200-200-20 MG/5ML suspension Commonly known as: MAALOX/MYLANTA Take 15 mLs by mouth every 4 (four) hours as needed for indigestion or heartburn.   famotidine 20 MG tablet Commonly known as: Pepcid Take 1 tablet (20 mg total) by mouth daily.   ibuprofen 200 MG tablet Commonly known as: ADVIL Take 400 mg by mouth every 6 (six) hours as needed for moderate pain.   insulin aspart 100 UNIT/ML injection Commonly known as: novoLOG Inject 0-9 Units into the skin 3 (three) times daily with meals.   insulin glargine 100 UNIT/ML injection Commonly known as: LANTUS Inject 0.35 mLs (35 Units total) into the skin daily.   nicotine 14 mg/24hr patch Commonly known as: NICODERM CQ - dosed in mg/24 hours Place 1 patch (14 mg total) onto the skin daily.       Discharge Instructions: Please refer to Patient Instructions section of EMR  for full details.  Patient was counseled important signs and symptoms that should prompt return to medical care, changes in medications, dietary instructions, activity restrictions, and follow up appointments.   Follow-Up  Appointments: Follow-up Information    [facility]. Go on 02/17/2019.   Specialty: Internal Medicine Why: 9:20 am with Raliegh Ip MD Contact information: [address] [place] [address] [phone]          Leeroy Bock, DO 02/12/2019, 12:49 PM PGY-2, [facility]

## 2019-02-02 NOTE — Progress Notes (Signed)
Family Medicine Teaching Service Daily Progress Note Intern Pager: [phone]  Patient name: Cleora Karnik Medical record number: [id number] Date of birth: September 11, 1985 Age: 33 y.o. Gender: female  Primary Care Provider: Patient, No Pcp Per Consultants: none Code Status: full  Pt Overview and Major Events to Date:  Presented with T1DM in DKA 7/2 7/3 probable transition from insulin gtt to subq  Assessment and Plan:  T1DM (DKA resolved) improved. Patient feels much better today and is able to tolerate a normal diet with some mild abdominal discomfort but no N/V. CBGs overnight ranged ~low 200s. She received 28 units lantus and 26 units novolog yesterday.  - increase lantus dose to 30 today - continue sSSI for meal coverage - f/u CSW for medication assistance, TOC for discharge supply - f/u diabetes coordinator for patient education/support - f/u nutrition - consider pneumovax vaccine prior to dc - recommended patient follow up with PCP for closer management after dc  Hypokalemia- resolved. K+4.1 yesterday s/p repletion yesterday. -BMP am   Chest tightness-resolved- ECG improved since admission, negative HST and normal echo. No SOB or CP today. Will discontinue cardiac monitors and transfer patient to med-surg.  - Continue to monitor vitals closely  Asthma-Stable - continue albuterol PRN  Tobacco abuse disorder- well tolerated - continue nicotine patch  FEN/GI: Diabetic diet PPx: lovenox  Disposition: discharge likely tomorrow pending home insulin regimen determination and resource.  Subjective:  Patient awake and alert, sitting up in bed and eating. Denies N/V, mild abdominal discomfort remains. No CP/SOB. Is in favor of going home as soon as possible but wants to get diabetes under control now so she doesn't have to come back again.   Objective: Temp:  [98.6 F (37 C)] 98.6 F (37 C) (07/05 0917) Pulse Rate:  [92] 92 (07/05 0917) Resp:  [9] 9 (07/05 0917) BP:  (105)/(71) 105/71 (07/05 0917) SpO2:  [97 %] 97 % (07/05 0917)   Physical Exam: General: NAD, alert, eating Cardio: RRR, no murmur appreciated  Pulm: no increased WOB, CTAB Abdomen: soft, no masses or rashes. Mild tenderness periumbilical. No rebound or guarding. BS+ Extremities: Negative edema. Warm/ well perfused. Neuro: alert and oriented, no focal deficits   Laboratory: Recent Labs  Lab 01/31/19 0241 02/01/19 0602 02/02/19 0526  WBC 5.6 4.6 3.9*  HGB 14.1 13.8 12.4  HCT 41.2 40.4 36.8  PLT 217 166 136*   Recent Labs  Lab 01/30/19 1333  02/01/19 0602 02/01/19 0857 02/01/19 1301  NA 134*   < > 134* 133* 132*  K 5.6*   < > 3.8 3.9 4.1  CL 97*   < > 100 100 100  CO2 11*   < > 21* 23 20*  BUN 20   < > 7 9 12   CREATININE 1.82*   < > 0.71 0.74 0.77  CALCIUM 10.5*   < > 9.1 9.0 9.0  PROT 9.3*  --   --   --   --   BILITOT 2.4*  --   --   --   --   ALKPHOS 112  --   --   --   --   ALT 35  --   --   --   --   AST 30  --   --   --   --   GLUCOSE 504*   < > 310* 373* 344*   < > = values in this interval not displayed.   c-peptide pending  Imaging/Diagnostic Tests: No results found.  Leeroy Bock, DO 02/02/2019, 10:20 AM PGY-1, [facility] FPTS Intern pager: [phone], text pages welcome

## 2019-02-03 LAB — BASIC METABOLIC PANEL
Anion gap: 10 (ref 5–15)
BUN: 9 mg/dL (ref 6–20)
CO2: 27 mmol/L (ref 22–32)
Calcium: 8.6 mg/dL — ABNORMAL LOW (ref 8.9–10.3)
Chloride: 103 mmol/L (ref 98–111)
Creatinine, Ser: 0.51 mg/dL (ref 0.44–1.00)
GFR calc Af Amer: >60 mL/min (ref >60)
GFR calc non Af Amer: >60 mL/min (ref >60)
Glucose, Bld: 172 mg/dL — ABNORMAL HIGH (ref 70–99)
Potassium: 3.7 mmol/L (ref 3.5–5.1)
Sodium: 140 mmol/L (ref 135–145)

## 2019-02-03 LAB — GLUCOSE, CAPILLARY
Glucose-Capillary: 186 mg/dL — ABNORMAL HIGH (ref 70–99)
Glucose-Capillary: 221 mg/dL — ABNORMAL HIGH (ref 70–99)
Glucose-Capillary: 261 mg/dL — ABNORMAL HIGH (ref 70–99)
Glucose-Capillary: 283 mg/dL — ABNORMAL HIGH (ref 70–99)

## 2019-02-03 LAB — C-PEPTIDE: C-Peptide: 0.2 ng/mL — ABNORMAL LOW (ref 1.1–4.4)

## 2019-02-03 MED ORDER — NICOTINE 7 MG/24HR TD PT24
7.0000 mg | MEDICATED_PATCH | Freq: Every day | TRANSDERMAL | Status: DC
  Administered 2019-02-03: 7 mg via TRANSDERMAL
  Filled 2019-02-03: qty 1

## 2019-02-03 MED ORDER — INSULIN ASPART 100 UNIT/ML ~~LOC~~ SOLN: 0.0000 [IU] | Freq: Three times a day (TID) | SUBCUTANEOUS | 11 refills | Status: DC

## 2019-02-03 MED ORDER — INSULIN GLARGINE 100 UNIT/ML ~~LOC~~ SOLN
35.0000 [IU] | Freq: Every day | SUBCUTANEOUS | Status: DC
  Administered 2019-02-03: 35 [IU] via SUBCUTANEOUS
  Filled 2019-02-03: qty 0.35

## 2019-02-03 MED ORDER — INSULIN GLARGINE 100 UNIT/ML ~~LOC~~ SOLN: 35.0000 [IU] | Freq: Every day | SUBCUTANEOUS | 11 refills | Status: DC

## 2019-02-03 MED FILL — NovoLOG 100 UNIT/ML SOLN: 100 | 28 days supply | Qty: 10 | Fill #0

## 2019-02-03 MED FILL — LANTUS 100 UNITS/ML VIAL: 100 | 28 days supply | Qty: 10 | Fill #0

## 2019-02-03 MED FILL — ULTICARE INS 0.5 ML 30GX1/2: 30G X 1/2" | 30 days supply | Qty: 100 | Fill #0

## 2019-02-03 NOTE — Progress Notes (Signed)
Offered to update family, patient is updating her mother and stated staff did not have to call., Plan is for discharge today.

## 2019-02-03 NOTE — Progress Notes (Signed)
Pt states has limited income, no health insurance. States unable to afford Rx meds( insulins). NCM explained Match Letter. Pt interested. Match Letter given. Pt states can afford $ 3.00.00 copay for each prescription.  NCM also scheduled hospital f/u with [facility], noted on AVS and made pt aware. Whitman Hero RN,BSN,CM

## 2019-02-03 NOTE — Discharge Instructions (Signed)
Sliding scale for short acting insulin CBG 70-120= 0 units,  CBG121-150= 1 unit,  CBG 151-200=2 units,  CBG 201-250=3 units,  CBG 251-300=5 units,  CBG 301-350= 7 units,  CBG 351-400=9 units,  CBG>400= call MD  Take long acting lantus daily in the am. Starting with 35units. Please monitor your blood sugars with meals and bedtime. Follow up closely with your primary care doctor this week if possible to go over your meds.  Diabetic Ketoacidosis Diabetic ketoacidosis is a serious complication of diabetes. This condition develops when there is not enough insulin in the body. Insulin is an hormone that regulates blood sugar levels in the body. Normally, insulin allows glucose to enter the cells in the body. The cells break down glucose for energy. Without enough insulin, the body cannot break down glucose, so it breaks down fats instead. This leads to high blood glucose levels in the body and the production of acids that are called ketones. Ketones are poisonous at high levels. If diabetic ketoacidosis is not treated, it can cause severe dehydration and can lead to a coma or death. What are the causes? This condition develops when a lack of insulin causes the body to break down fats instead of glucose. This may be triggered by:  Stress on the body. This stress is brought on by an illness.  Infection.  Medicines that raise blood glucose levels.  Not taking diabetes medicine.  New onset of type 1 diabetes mellitus. What are the signs or symptoms? Symptoms of this condition include:  Fatigue.  Weight loss.  Excessive thirst.  Light-headedness.  Fruity or sweet-smelling breath.  Excessive urination.  Vision changes.  Confusion or irritability.  Nausea.  Vomiting.  Rapid breathing.  Abdominal pain.  Feeling flushed. How is this diagnosed? This condition is diagnosed based on your medical history, a physical exam, and blood tests. You may also have a urine test to check  for ketones. How is this treated? This condition may be treated with:  Fluid replacement. This may be done to correct dehydration.  Insulin injections. These may be given through the skin or through an IV tube.  Electrolyte replacement. Electrolytes are minerals in your blood. Electrolytes such as potassium and sodium may be given in pill form or through an IV tube.  Antibiotic medicines. These may be prescribed if your condition was caused by an infection. Diabetic ketoacidosis is a serious medical condition. You may need emergency treatment in the hospital to monitor your condition. Follow these instructions at home: Eating and drinking  Drink enough fluids to keep your urine clear or pale yellow.  If you are not able to eat, drink clear fluids in small amounts as you are able. Clear fluids include water, ice chips, fruit juice with water added (diluted), and low-calorie sports drinks. You may also have sugar-free jello or popsicles.  If you are able to eat, follow your usual diet and drink sugar-free liquids, such as water. Medicines  Take over-the-counter and prescription medicines only as told by your health care provider.  Continue to take insulin and other diabetes medicines as told by your health care provider.  If you were prescribed an antibiotic, take it as told by your health care provider. Do not stop taking the antibiotic even if you start to feel better. General instructions   Check your urine for ketones when you are ill and as told by your health care provider. ? If your blood glucose is 240 mg/dL (30.8 mmol/L) or higher, check  your urine ketones every 4-6 hours.  Check your blood glucose every day, as often as told by your health care provider. ? If your blood glucose is high, drink plenty of fluids. This helps to flush out ketones. ? If your blood glucose is above your target for 2 tests in a row, contact your health care provider.  Carry a medical alert card  or wear medical alert jewelry that says that you have diabetes.  Rest and exercise only as told by your health care provider. Do not exercise when your blood glucose is high and you have ketones in your urine.  If you get sick, call your health care provider and begin treatment quickly. Your body often needs extra insulin to fight an illness. Check your blood glucose every 4-6 hours when you are sick.  Keep all follow-up visits as told by your health care provider. This is important. Contact a health care provider if:  Your blood glucose level is higher than 240 mg/dL (13.3 mmol/L) for 2 days in a row.  You have moderate or large ketones in your urine.  You have a fever.  You cannot eat or drink without vomiting.  You have been vomiting for more than 2 hours.  You continue to have symptoms of diabetic ketoacidosis.  You develop new symptoms. Get help right away if:  Your blood glucose monitor reads high even when you are taking insulin.  You faint.  You have chest pain.  You have trouble breathing.  You have sudden trouble speaking or swallowing.  You have vomiting or diarrhea that gets worse after 3 hours.  You are unable to stay awake.  You have trouble thinking.  You are severely dehydrated. Symptoms of severe dehydration include: ? Extreme thirst. ? Dry mouth. ? Rapid breathing. These symptoms may represent a serious problem that is an emergency. Do not wait to see if the symptoms will go away. Get medical help right away. Call your local emergency services (911 in the [place] Do not drive yourself to the hospital. Summary  Diabetic ketoacidosis is a serious complication of diabetes. This condition develops when there is not enough insulin in the body.  This condition is diagnosed based on your medical history, a physical exam, and blood tests. You may also have a urine test to check for ketones.  Diabetic ketoacidosis is a serious medical condition. You may  need emergency treatment in the hospital to monitor your condition.  Contact your health care provider if your blood glucose is higher than 240 mg/dl for 2 days in a row or if you have moderate or large ketones in your urine. This information is not intended to replace advice given to you by your health care provider. Make sure you discuss any questions you have with your health care provider. Document Released: 07/14/2000 Document Revised: 09/01/2016 Document Reviewed: 08/21/2016 Elsevier Patient Education  2020 [employer]- [email or web address]-[email or web address]-[email or web address]

## 2019-02-17 ENCOUNTER — Ambulatory Visit: Payer: Self-pay | Admitting: Family Medicine

## 2019-03-16 ENCOUNTER — Telehealth: Payer: Self-pay | Admitting: Physician Assistant

## 2019-03-16 DIAGNOSIS — R0602 Shortness of breath: Secondary | ICD-10-CM

## 2019-03-16 NOTE — Progress Notes (Signed)
  E-Visit for Corona Virus Screening  Based on what you have shared with me, you need to seek an evaluation for a severe illness that is causing your symptoms which may be coronavirus or some other illness. I recommend that you be seen and evaluated "face to face". Our Emergency Departments are best equipped to handle patients with severe symptoms.  You will be evaluated by the ER provider (or higher level of care provider) who will determine whether you need formal testing.   I recommend the following:  . If you are having a true medical emergency please call 911. . If you are considered high risk for Corona virus because of a known exposure, fever, shortness of breath and cough, OR if you have severe symptoms of any kind, seek medical care at an emergency room.   . [facility] Emergency Department [address], [place], [place] [address] [phone]  . [facility] [place] Emergency Department [address], [place], [place] [address] [phone]  . [facility] Emergency Department [address], [place], [place] [address] [phone]  . [facility] Emergency Department [address], [place], [place] [address] [phone]  . [facility] Emergency Department [address], [place], [place] [address] [phone]  NOTE: If you entered your credit card information for this eVisit, you will not be charged. You may see a "hold" on your card for the $35 but that hold will drop off and you will not have a charge processed.   Your e-visit answers were reviewed by a board certified advanced clinical practitioner to complete your personal care plan.  Thank you for using e-Visits.  

## 2019-03-17 ENCOUNTER — Encounter (HOSPITAL_COMMUNITY): Payer: Self-pay

## 2019-03-17 ENCOUNTER — Other Ambulatory Visit: Payer: Self-pay

## 2019-03-17 ENCOUNTER — Emergency Department (HOSPITAL_COMMUNITY): Payer: HRSA Program

## 2019-03-17 ENCOUNTER — Emergency Department (HOSPITAL_COMMUNITY)
Admission: EM | Admit: 2019-03-17 | Discharge: 2019-03-17 | Disposition: A | Discharge status: Home / Self Care | Payer: HRSA Program | Attending: Emergency Medicine | Admitting: Emergency Medicine

## 2019-03-17 DIAGNOSIS — R509 Fever, unspecified: Secondary | ICD-10-CM

## 2019-03-17 DIAGNOSIS — Z794 Long term (current) use of insulin: Secondary | ICD-10-CM | POA: Diagnosis not present

## 2019-03-17 DIAGNOSIS — R1110 Vomiting, unspecified: Secondary | ICD-10-CM | POA: Diagnosis present

## 2019-03-17 DIAGNOSIS — F17200 Nicotine dependence, unspecified, uncomplicated: Secondary | ICD-10-CM | POA: Diagnosis not present

## 2019-03-17 DIAGNOSIS — Z20828 Contact with and (suspected) exposure to other viral communicable diseases: Secondary | ICD-10-CM | POA: Diagnosis not present

## 2019-03-17 DIAGNOSIS — J45909 Unspecified asthma, uncomplicated: Secondary | ICD-10-CM | POA: Diagnosis not present

## 2019-03-17 DIAGNOSIS — E1065 Type 1 diabetes mellitus with hyperglycemia: Secondary | ICD-10-CM | POA: Insufficient documentation

## 2019-03-17 DIAGNOSIS — Z79899 Other long term (current) drug therapy: Secondary | ICD-10-CM | POA: Diagnosis not present

## 2019-03-17 DIAGNOSIS — B349 Viral infection, unspecified: Secondary | ICD-10-CM | POA: Insufficient documentation

## 2019-03-17 LAB — BASIC METABOLIC PANEL
Anion gap: 10 (ref 5–15)
BUN: 10 mg/dL (ref 6–20)
CO2: 24 mmol/L (ref 22–32)
Calcium: 9.4 mg/dL (ref 8.9–10.3)
Chloride: 96 mmol/L — ABNORMAL LOW (ref 98–111)
Creatinine, Ser: 0.7 mg/dL (ref 0.44–1.00)
GFR calc Af Amer: >60 mL/min (ref >60)
GFR calc non Af Amer: >60 mL/min (ref >60)
Glucose, Bld: 282 mg/dL — ABNORMAL HIGH (ref 70–99)
Potassium: 4 mmol/L (ref 3.5–5.1)
Sodium: 130 mmol/L — ABNORMAL LOW (ref 135–145)

## 2019-03-17 LAB — CBC WITH DIFFERENTIAL/PLATELET
Abs Immature Granulocytes: 0.02 10*3/uL (ref 0.00–0.07)
Basophils Absolute: 0 10*3/uL (ref 0.0–0.1)
Basophils Relative: 0 %
Eosinophils Absolute: 0 10*3/uL (ref 0.0–0.5)
Eosinophils Relative: 1 %
HCT: 44.8 % (ref 36.0–46.0)
Hemoglobin: 15.2 g/dL — ABNORMAL HIGH (ref 12.0–15.0)
Immature Granulocytes: 0 %
Lymphocytes Relative: 37 %
Lymphs Abs: 2.7 10*3/uL (ref 0.7–4.0)
MCH: 33 pg (ref 26.0–34.0)
MCHC: 33.9 g/dL (ref 30.0–36.0)
MCV: 97.4 fL (ref 80.0–100.0)
Monocytes Absolute: 0.6 10*3/uL (ref 0.1–1.0)
Monocytes Relative: 8 %
Neutro Abs: 3.9 10*3/uL (ref 1.7–7.7)
Neutrophils Relative %: 54 %
Platelets: 251 10*3/uL (ref 150–400)
RBC: 4.6 MIL/uL (ref 3.87–5.11)
RDW: 12.2 % (ref 11.5–15.5)
WBC: 7.2 10*3/uL (ref 4.0–10.5)
nRBC: 0 % (ref 0.0–0.2)

## 2019-03-17 LAB — I-STAT BETA HCG BLOOD, ED (MC, WL, AP ONLY): I-stat hCG, quantitative: <5 m[IU]/mL (ref <5)

## 2019-03-17 LAB — BLOOD GAS, VENOUS
Acid-Base Excess: 0.3 mmol/L (ref 0.0–2.0)
Bicarbonate: 24.7 mmol/L (ref 20.0–28.0)
O2 Saturation: 87.1 %
Patient temperature: 98.6
pCO2, Ven: 41.2 mmHg — ABNORMAL LOW (ref 44.0–60.0)
pH, Ven: 7.396 (ref 7.250–7.430)
pO2, Ven: 53.5 mmHg — ABNORMAL HIGH (ref 32.0–45.0)

## 2019-03-17 LAB — SARS CORONAVIRUS 2 BY RT PCR (HOSPITAL ORDER, PERFORMED IN ~~LOC~~ HOSPITAL LAB): SARS Coronavirus 2: NEGATIVE

## 2019-03-17 MED ORDER — SODIUM CHLORIDE 0.9 % IV SOLN: INTRAVENOUS | Status: DC

## 2019-03-17 MED ORDER — SODIUM CHLORIDE 0.9 % IV BOLUS
2000.0000 mL | Freq: Once | INTRAVENOUS | Status: AC
  Administered 2019-03-17: 2000 mL via INTRAVENOUS

## 2019-03-17 MED ORDER — PROMETHAZINE HCL 25 MG/ML IJ SOLN
25.0000 mg | Freq: Once | INTRAMUSCULAR | Status: AC
  Administered 2019-03-17: 25 mg via INTRAVENOUS
  Filled 2019-03-17: qty 1

## 2019-03-17 NOTE — ED Provider Notes (Signed)
[facility]-EMERGENCY DEPT Provider Note   CSN: [id number] Arrival date & time: 03/17/19  1518     History   Chief Complaint Chief Complaint  Patient presents with  . COVID Symptoms    HPI Madison Williams is a 33 y.o. female.     33 year old female with history of type 1 diabetes and high blood sugars at home due to insulin noncompliance presents with several days of myalgias, fevers treated with ibuprofen.  She has had emesis but no diarrhea.  Mild cough.  No severe dyspnea.  Did vomit twice today.  Endorses polyuria and polydipsia and does have a history of DKA in the past.  No rashes or neck pain or photophobia.     Past Medical History:  Diagnosis Date  . Asthma   . Diabetes mellitus without complication (HCC)   . Rheumatoid arthritis [facility](HCC)     Patient Active Problem List   Diagnosis Date Noted  . Chest tightness   . Mild intermittent asthma without complication   . Acute vomiting 01/31/2019  . Type 1 diabetes mellitus with hyperglycemia (HCC) 01/31/2019  . Uninsured 01/31/2019  . DKA (diabetic ketoacidoses) (HCC) 03/30/2018  . ARF (acute renal failure) (HCC) 03/30/2018  . Leucocytosis 03/30/2018  . Dehydration 03/30/2018  . Tobacco use disorder 03/30/2018    Past Surgical History:  Procedure Laterality Date  . LAPAROSCOPIC OVARIAN       OB History   No obstetric history on file.      Home Medications    Prior to Admission medications   Medication Sig Start Date End Date Taking? Authorizing Provider  acetaminophen (TYLENOL) 325 MG tablet Take 650 mg by mouth every 6 (six) hours as needed for moderate pain.    [provider]  albuterol (PROVENTIL HFA;VENTOLIN HFA) 108 (90 Base) MCG/ACT inhaler Inhale 2 puffs into the lungs every 6 (six) hours as needed for wheezing or shortness of breath.    [provider]  alum & mag hydroxide-simeth (MAALOX/MYLANTA) 200-200-20 MG/5ML suspension Take 15 mLs by mouth every  4 (four) hours as needed for indigestion or heartburn. 04/01/18   Rolly SalterPatel, Pranav M, MD  famotidine (PEPCID) 20 MG tablet Take 1 tablet (20 mg total) by mouth daily. 04/01/18 05/01/18  Rolly SalterPatel, Pranav M, MD  ibuprofen (ADVIL,MOTRIN) 200 MG tablet Take 400 mg by mouth every 6 (six) hours as needed for moderate pain.    [provider]  insulin aspart (NOVOLOG) 100 UNIT/ML injection Inject 0-9 Units into the skin 3 (three) times daily with meals. 02/03/19   Marthenia RollingBland, Scott, DO  insulin glargine (LANTUS) 100 UNIT/ML injection Inject 0.35 mLs (35 Units total) into the skin daily. 02/03/19   Marthenia RollingBland, Scott, DO  nicotine (NICODERM CQ - DOSED IN MG/24 HOURS) 14 mg/24hr patch Place 1 patch (14 mg total) onto the skin daily. Patient not taking: Reported on 01/30/2019 04/01/18   Rolly SalterPatel, Pranav M, MD    Family History No family history on file.  Social History Social History   Tobacco Use  . Smoking status: Current Every Day Smoker    Packs/day: 1.00  . Smokeless tobacco: Never Used  Substance Use Topics  . Alcohol use: Yes    Comment: rarely  . Drug use: Never     Allergies   Patient has no known allergies.   Review of Systems Review of Systems  All other systems reviewed and are negative.    Physical Exam Updated Vital Signs BP 125/84   Pulse Marland Kitchen(!)  138   Temp 99.3 F (37.4 C) (Oral)   Resp 16   Wt 56.9 kg   LMP 03/16/2018   SpO2 99%   BMI 19.66 kg/m   Physical Exam Vitals signs and nursing note reviewed.  Constitutional:      General: She is not in acute distress.    Appearance: Normal appearance. She is well-developed. She is not toxic-appearing.  HENT:     Head: Normocephalic and atraumatic.  Eyes:     General: Lids are normal.     Conjunctiva/sclera: Conjunctivae normal.     Pupils: Pupils are equal, round, and reactive to light.  Neck:     Musculoskeletal: Normal range of motion and neck supple.     Thyroid: No thyroid mass.     Trachea: No tracheal deviation.   Cardiovascular:     Rate and Rhythm: Regular rhythm. Tachycardia present.     Heart sounds: Normal heart sounds. No murmur. No gallop.   Pulmonary:     Effort: Pulmonary effort is normal. No respiratory distress.     Breath sounds: Normal breath sounds. No stridor. No decreased breath sounds, wheezing, rhonchi or rales.  Abdominal:     General: Bowel sounds are normal. There is no distension.     Palpations: Abdomen is soft.     Tenderness: There is no abdominal tenderness. There is no rebound.  Musculoskeletal: Normal range of motion.        General: No tenderness.  Skin:    General: Skin is warm and dry.     Findings: No abrasion or rash.  Neurological:     Mental Status: She is alert and oriented to person, place, and time.     GCS: GCS eye subscore is 4. GCS verbal subscore is 5. GCS motor subscore is 6.     Cranial Nerves: No cranial nerve deficit.     Sensory: No sensory deficit.  Psychiatric:        Speech: Speech normal.        Behavior: Behavior normal.      ED Treatments / Results  Labs (all labs ordered are listed, but only abnormal results are displayed) Labs Reviewed  URINE CULTURE  SARS CORONAVIRUS 2 ([facility])  CBC WITH DIFFERENTIAL/PLATELET  BASIC METABOLIC PANEL  URINALYSIS, ROUTINE W REFLEX MICROSCOPIC  BLOOD GAS, VENOUS  I-STAT BETA HCG BLOOD, ED ([facility], [facility], [facility] ONLY)    EKG None  Radiology Dg Chest Port 1 View  Result Date: 03/17/2019 CLINICAL DATA:  Fever and cough for several days EXAM: PORTABLE CHEST 1 VIEW COMPARISON:  01/30/2019 FINDINGS: The heart size and mediastinal contours are within normal limits. Both lungs are clear. The visualized skeletal structures are unremarkable. IMPRESSION: No active disease. Electronically Signed   By: Inez Catalina M.D.   On: 03/17/2019 16:35    Procedures Procedures (including critical care time)  Medications Ordered in ED Medications  sodium chloride 0.9 % bolus  2,000 mL (has no administration in time range)  0.9 %  sodium chloride infusion (has no administration in time range)  promethazine (PHENERGAN) injection 25 mg (has no administration in time range)     Initial Impression / Assessment and Plan / ED Course  I have reviewed the triage vital signs and the nursing notes.  Pertinent labs & imaging results that were available during my care of the patient were reviewed by me and considered in my medical decision making (see chart for details).  Patient with negative chest x-ray here.  Globin test is negative.  Mild hyperglycemia noted.  Suspect viral illness and she is stable for discharge  Final Clinical Impressions(s) / ED Diagnoses   Final diagnoses:  Fever    ED Discharge Orders    None       Lorre Nick, MD 03/17/19 2011

## 2019-03-17 NOTE — ED Notes (Signed)
Pt verbalized discharge instructions and follow up care. Alert and ambulatory. No IV.  

## 2019-03-17 NOTE — ED Triage Notes (Signed)
Pt states she did the e-visit and was told to come for COVID symptoms. Pt c/o fever, coughing, body aches since Friday. Pt also states emesis x 2 today  Fever of up to 101.2

## 2019-04-02 ENCOUNTER — Encounter (HOSPITAL_COMMUNITY): Payer: Self-pay

## 2019-04-02 ENCOUNTER — Other Ambulatory Visit: Payer: Self-pay

## 2019-04-02 ENCOUNTER — Inpatient Hospital Stay (HOSPITAL_COMMUNITY)
Admission: EM | Admit: 2019-04-02 | Discharge: 2019-04-06 | DRG: 638 | Disposition: A | Discharge status: Home / Self Care | Payer: Self-pay | Attending: Student in an Organized Health Care Education/Training Program | Admitting: Student in an Organized Health Care Education/Training Program

## 2019-04-02 DIAGNOSIS — E876 Hypokalemia: Secondary | ICD-10-CM | POA: Diagnosis not present

## 2019-04-02 DIAGNOSIS — F17200 Nicotine dependence, unspecified, uncomplicated: Secondary | ICD-10-CM

## 2019-04-02 DIAGNOSIS — E1110 Type 2 diabetes mellitus with ketoacidosis without coma: Secondary | ICD-10-CM | POA: Diagnosis present

## 2019-04-02 DIAGNOSIS — Z20828 Contact with and (suspected) exposure to other viral communicable diseases: Secondary | ICD-10-CM | POA: Diagnosis present

## 2019-04-02 DIAGNOSIS — E860 Dehydration: Secondary | ICD-10-CM | POA: Diagnosis present

## 2019-04-02 DIAGNOSIS — M069 Rheumatoid arthritis, unspecified: Secondary | ICD-10-CM | POA: Diagnosis present

## 2019-04-02 DIAGNOSIS — D72829 Elevated white blood cell count, unspecified: Secondary | ICD-10-CM | POA: Diagnosis present

## 2019-04-02 DIAGNOSIS — E0810 Diabetes mellitus due to underlying condition with ketoacidosis without coma: Secondary | ICD-10-CM

## 2019-04-02 DIAGNOSIS — J4520 Mild intermittent asthma, uncomplicated: Secondary | ICD-10-CM | POA: Diagnosis present

## 2019-04-02 DIAGNOSIS — R1011 Right upper quadrant pain: Secondary | ICD-10-CM

## 2019-04-02 DIAGNOSIS — N179 Acute kidney failure, unspecified: Secondary | ICD-10-CM | POA: Diagnosis present

## 2019-04-02 DIAGNOSIS — F17210 Nicotine dependence, cigarettes, uncomplicated: Secondary | ICD-10-CM | POA: Diagnosis present

## 2019-04-02 DIAGNOSIS — Z794 Long term (current) use of insulin: Secondary | ICD-10-CM

## 2019-04-02 DIAGNOSIS — J45909 Unspecified asthma, uncomplicated: Secondary | ICD-10-CM

## 2019-04-02 DIAGNOSIS — E1065 Type 1 diabetes mellitus with hyperglycemia: Secondary | ICD-10-CM | POA: Diagnosis present

## 2019-04-02 DIAGNOSIS — E1010 Type 1 diabetes mellitus with ketoacidosis without coma: Principal | ICD-10-CM | POA: Diagnosis present

## 2019-04-02 DIAGNOSIS — Z79899 Other long term (current) drug therapy: Secondary | ICD-10-CM

## 2019-04-02 LAB — COMPREHENSIVE METABOLIC PANEL
ALT: 32 U/L (ref 0–44)
AST: 29 U/L (ref 15–41)
Albumin: 5.3 g/dL — ABNORMAL HIGH (ref 3.5–5.0)
Alkaline Phosphatase: 117 U/L (ref 38–126)
Anion gap: 33 — ABNORMAL HIGH (ref 5–15)
BUN: 20 mg/dL (ref 6–20)
CO2: 8 mmol/L — ABNORMAL LOW (ref 22–32)
Calcium: 9.8 mg/dL (ref 8.9–10.3)
Chloride: 95 mmol/L — ABNORMAL LOW (ref 98–111)
Creatinine, Ser: 1.75 mg/dL — ABNORMAL HIGH (ref 0.44–1.00)
GFR calc Af Amer: 44 mL/min — ABNORMAL LOW (ref >60)
GFR calc non Af Amer: 38 mL/min — ABNORMAL LOW (ref >60)
Glucose, Bld: 207 mg/dL — ABNORMAL HIGH (ref 70–99)
Potassium: 4.5 mmol/L (ref 3.5–5.1)
Sodium: 136 mmol/L (ref 135–145)
Total Bilirubin: 1.9 mg/dL — ABNORMAL HIGH (ref 0.3–1.2)
Total Protein: 9.5 g/dL — ABNORMAL HIGH (ref 6.5–8.1)

## 2019-04-02 LAB — URINALYSIS, ROUTINE W REFLEX MICROSCOPIC
Bacteria, UA: NONE SEEN
Bilirubin Urine: NEGATIVE
Glucose, UA: 50 mg/dL — AB
Ketones, ur: 80 mg/dL — AB
Leukocytes,Ua: NEGATIVE
Nitrite: NEGATIVE
Protein, ur: >=300 mg/dL — AB
Specific Gravity, Urine: 1.019 (ref 1.005–1.030)
pH: 5 (ref 5.0–8.0)

## 2019-04-02 LAB — CBC
HCT: 53.1 % — ABNORMAL HIGH (ref 36.0–46.0)
Hemoglobin: 17.1 g/dL — ABNORMAL HIGH (ref 12.0–15.0)
MCH: 33.5 pg (ref 26.0–34.0)
MCHC: 32.2 g/dL (ref 30.0–36.0)
MCV: 103.9 fL — ABNORMAL HIGH (ref 80.0–100.0)
Platelets: 401 10*3/uL — ABNORMAL HIGH (ref 150–400)
RBC: 5.11 MIL/uL (ref 3.87–5.11)
RDW: 13.3 % (ref 11.5–15.5)
WBC: 20.7 10*3/uL — ABNORMAL HIGH (ref 4.0–10.5)
nRBC: 0 % (ref 0.0–0.2)

## 2019-04-02 LAB — CBG MONITORING, ED
Glucose-Capillary: 237 mg/dL — ABNORMAL HIGH (ref 70–99)
Glucose-Capillary: 241 mg/dL — ABNORMAL HIGH (ref 70–99)
Glucose-Capillary: 329 mg/dL — ABNORMAL HIGH (ref 70–99)

## 2019-04-02 LAB — LIPASE, BLOOD: Lipase: 19 U/L (ref 11–51)

## 2019-04-02 LAB — RAPID URINE DRUG SCREEN, HOSP PERFORMED
Amphetamines: NOT DETECTED
Barbiturates: NOT DETECTED
Benzodiazepines: NOT DETECTED
Cocaine: NOT DETECTED
Opiates: NOT DETECTED
Tetrahydrocannabinol: NOT DETECTED

## 2019-04-02 LAB — I-STAT BETA HCG BLOOD, ED (MC, WL, AP ONLY): I-stat hCG, quantitative: <5 m[IU]/mL (ref <5)

## 2019-04-02 MED ORDER — ONDANSETRON HCL 4 MG PO TABS: 4.0000 mg | ORAL_TABLET | Freq: Three times a day (TID) | ORAL | Status: DC | PRN

## 2019-04-02 MED ORDER — SODIUM CHLORIDE 0.9% FLUSH: 3.0000 mL | Freq: Once | INTRAVENOUS | Status: DC

## 2019-04-02 MED ORDER — DEXTROSE-NACL 5-0.45 % IV SOLN: INTRAVENOUS | Status: DC

## 2019-04-02 MED ORDER — ENOXAPARIN SODIUM 40 MG/0.4ML ~~LOC~~ SOLN
40.0000 mg | SUBCUTANEOUS | Status: DC
  Administered 2019-04-03 – 2019-04-06 (×4): 40 mg via SUBCUTANEOUS
  Filled 2019-04-02 (×4): qty 0.4

## 2019-04-02 MED ORDER — SODIUM CHLORIDE 0.9 % IV SOLN: INTRAVENOUS | Status: DC

## 2019-04-02 MED ORDER — ONDANSETRON HCL 4 MG PO TABS: 4.0000 mg | ORAL_TABLET | Freq: Four times a day (QID) | ORAL | Status: DC | PRN

## 2019-04-02 MED ORDER — ONDANSETRON HCL 4 MG/2ML IJ SOLN
4.0000 mg | Freq: Four times a day (QID) | INTRAMUSCULAR | Status: DC | PRN
  Administered 2019-04-03 (×2): 4 mg via INTRAVENOUS
  Filled 2019-04-02 (×4): qty 2

## 2019-04-02 MED ORDER — HYDROCHLOROTHIAZIDE 25 MG PO TABS
25.0000 mg | ORAL_TABLET | Freq: Once | ORAL | Status: AC
  Administered 2019-04-02: 22:00:00 25 mg via ORAL
  Filled 2019-04-02: qty 1

## 2019-04-02 MED ORDER — DEXTROSE-NACL 5-0.45 % IV SOLN
INTRAVENOUS | Status: DC
  Administered 2019-04-03: 02:00:00 via INTRAVENOUS

## 2019-04-02 MED ORDER — INSULIN REGULAR(HUMAN) IN NACL 100-0.9 UT/100ML-% IV SOLN
INTRAVENOUS | Status: DC
  Administered 2019-04-02 – 2019-04-03 (×2): 1.8 [IU]/h via INTRAVENOUS
  Filled 2019-04-02 (×2): qty 100

## 2019-04-02 MED ORDER — ALBUTEROL SULFATE (2.5 MG/3ML) 0.083% IN NEBU: 2.5000 mg | INHALATION_SOLUTION | Freq: Four times a day (QID) | RESPIRATORY_TRACT | Status: DC | PRN

## 2019-04-02 MED ORDER — PROMETHAZINE HCL 25 MG/ML IJ SOLN
25.0000 mg | Freq: Once | INTRAMUSCULAR | Status: AC
  Administered 2019-04-02: 22:00:00 25 mg via INTRAMUSCULAR
  Filled 2019-04-02: qty 1

## 2019-04-02 MED ORDER — ONDANSETRON HCL 4 MG/2ML IJ SOLN
4.0000 mg | Freq: Once | INTRAMUSCULAR | Status: DC
  Filled 2019-04-02: qty 2

## 2019-04-02 MED ORDER — ONDANSETRON HCL 4 MG/2ML IJ SOLN: 4.0000 mg | Freq: Three times a day (TID) | INTRAMUSCULAR | Status: DC | PRN

## 2019-04-02 MED ORDER — SODIUM CHLORIDE 0.9 % IV BOLUS
1000.0000 mL | Freq: Once | INTRAVENOUS | Status: AC
  Administered 2019-04-02: 22:00:00 1000 mL via INTRAVENOUS

## 2019-04-02 MED ORDER — POTASSIUM CHLORIDE 10 MEQ/100ML IV SOLN
10.0000 meq | INTRAVENOUS | Status: AC
  Administered 2019-04-03 (×2): 10 meq via INTRAVENOUS
  Filled 2019-04-02 (×2): qty 100

## 2019-04-02 MED ORDER — ONDANSETRON 4 MG PO TBDP
4.0000 mg | ORAL_TABLET | Freq: Once | ORAL | Status: AC | PRN
  Administered 2019-04-02: 21:00:00 4 mg via ORAL
  Filled 2019-04-02: qty 1

## 2019-04-02 MED ORDER — SODIUM CHLORIDE 0.9 % IV SOLN: INTRAVENOUS | Status: AC

## 2019-04-02 MED ORDER — ALBUTEROL SULFATE HFA 108 (90 BASE) MCG/ACT IN AERS: 2.0000 | INHALATION_SPRAY | Freq: Four times a day (QID) | RESPIRATORY_TRACT | Status: DC | PRN

## 2019-04-02 MED ORDER — ONDANSETRON HCL 4 MG/2ML IJ SOLN: 4.0000 mg | Freq: Four times a day (QID) | INTRAMUSCULAR | Status: DC | PRN

## 2019-04-02 NOTE — ED Triage Notes (Signed)
Onset last night vomiting, more than 20 times today.  No diarrhea, fever.  Tested negative for covid around 2 weeks ago.

## 2019-04-02 NOTE — ED Provider Notes (Signed)
[facility] EMERGENCY DEPARTMENT Provider Note   CSN: [id number] Arrival date & time: 04/02/19  1846     History   Chief Complaint Chief Complaint  Patient presents with  . Emesis    HPI [address] [place] is a 33 y.o. female.     Patient with hx iddm, c/o multiple episodes of nausea/vomiting in the past 1-2 days. Emesis clear to sl yellow, not bloody or bilious. Symptoms acute onset, moderate-severe, persistent, recurrent. Hx same. Hx dka. Did take a dose of insulin approximately 2-3 hours ago. +polyuria and polydipsia. Pt feels generally weak, lightheaded when stands. Denies recent fever/chills. No cough or uri symptoms. No headaches. No neck pain or stiffness. No dysuria. No abd or pelvic pain.   The history is provided by the patient.  Emesis Associated symptoms: no abdominal pain, no chills, no cough, no diarrhea, no fever, no headaches and no sore throat     Past Medical History:  Diagnosis Date  . Asthma   . Diabetes mellitus without complication (HCC)   . Rheumatoid arthritis [facility])     Patient Active Problem List   Diagnosis Date Noted  . Chest tightness   . Mild intermittent asthma without complication   . Acute vomiting 01/31/2019  . Type 1 diabetes mellitus with hyperglycemia (HCC) 01/31/2019  . Uninsured 01/31/2019  . DKA (diabetic ketoacidoses) (HCC) 03/30/2018  . ARF (acute renal failure) (HCC) 03/30/2018  . Leucocytosis 03/30/2018  . Dehydration 03/30/2018  . Tobacco use disorder 03/30/2018    Past Surgical History:  Procedure Laterality Date  . LAPAROSCOPIC OVARIAN       OB History   No obstetric history on file.      Home Medications    Prior to Admission medications   Medication Sig Start Date End Date Taking? Authorizing Provider  acetaminophen (TYLENOL) 325 MG tablet Take 650 mg by mouth every 6 (six) hours as needed for moderate pain.    [provider]  albuterol (PROVENTIL HFA;VENTOLIN HFA) 108 (90  Base) MCG/ACT inhaler Inhale 2 puffs into the lungs every 6 (six) hours as needed for wheezing or shortness of breath.    [provider]  alum & mag hydroxide-simeth (MAALOX/MYLANTA) 200-200-20 MG/5ML suspension Take 15 mLs by mouth every 4 (four) hours as needed for indigestion or heartburn. 04/01/18   Rolly Salter, MD  famotidine (PEPCID) 20 MG tablet Take 1 tablet (20 mg total) by mouth daily. 04/01/18 05/01/18  Rolly Salter, MD  ibuprofen (ADVIL,MOTRIN) 200 MG tablet Take 400 mg by mouth every 6 (six) hours as needed for fever or moderate pain.     [provider]  insulin aspart (NOVOLOG) 100 UNIT/ML injection Inject 0-9 Units into the skin 3 (three) times daily with meals. 02/03/19   Marthenia Rolling, DO  insulin glargine (LANTUS) 100 UNIT/ML injection Inject 0.35 mLs (35 Units total) into the skin daily. Patient taking differently: Inject 30 Units into the skin daily.  02/03/19   Marthenia Rolling, DO  nicotine (NICODERM CQ - DOSED IN MG/24 HOURS) 14 mg/24hr patch Place 1 patch (14 mg total) onto the skin daily. Patient not taking: Reported on 01/30/2019 04/01/18   Rolly Salter, MD    Family History History reviewed. No pertinent family history.  Social History Social History   Tobacco Use  . Smoking status: Current Every Day Smoker    Packs/day: 1.00  . Smokeless tobacco: Never Used  Substance Use Topics  . Alcohol use: Yes  Comment: rarely  . Drug use: Never     Allergies   Patient has no known allergies.   Review of Systems Review of Systems  Constitutional: Negative for chills and fever.  HENT: Negative for sore throat.   Eyes: Negative for redness.  Respiratory: Negative for cough and shortness of breath.   Cardiovascular: Negative for chest pain.  Gastrointestinal: Positive for nausea and vomiting. Negative for abdominal pain and diarrhea.  Endocrine: Positive for polydipsia and polyuria.  Genitourinary: Negative for dysuria and flank pain.   Musculoskeletal: Negative for back pain and neck pain.  Skin: Negative for rash.  Neurological: Positive for light-headedness. Negative for headaches.  Hematological: Does not bruise/bleed easily.  Psychiatric/Behavioral: Negative for confusion.     Physical Exam Updated Vital Signs BP (!) 143/64 (BP Location: Right Arm)   Pulse 67   Temp 98 F (36.7 C) (Oral)   Resp 18   SpO2 100%   Physical Exam Vitals signs and nursing note reviewed.  Constitutional:      Appearance: Normal appearance. She is well-developed.  HENT:     Head: Atraumatic.     Nose: Nose normal.     Mouth/Throat:     Pharynx: No oropharyngeal exudate or posterior oropharyngeal erythema.     Comments: Dry mm.  Eyes:     General: No scleral icterus.    Conjunctiva/sclera: Conjunctivae normal.     Pupils: Pupils are equal, round, and reactive to light.  Neck:     Musculoskeletal: Normal range of motion and neck supple. No neck rigidity or muscular tenderness.     Trachea: No tracheal deviation.     Comments: No stiffness or rigidity.  Cardiovascular:     Rate and Rhythm: Regular rhythm. Tachycardia present.     Pulses: Normal pulses.     Heart sounds: Normal heart sounds. No murmur. No friction rub. No gallop.   Pulmonary:     Effort: Pulmonary effort is normal. No respiratory distress.     Breath sounds: Normal breath sounds.  Abdominal:     General: Bowel sounds are normal. There is no distension.     Palpations: Abdomen is soft. There is no mass.     Tenderness: There is no abdominal tenderness. There is no guarding or rebound.     Hernia: No hernia is present.  Genitourinary:    Comments: No cva tenderness.  Musculoskeletal:        General: No swelling or tenderness.  Skin:    General: Skin is warm and dry.     Findings: No rash.  Neurological:     Mental Status: She is alert.     Comments: Alert, speech normal. Motor/sens grossly intact. Steady gait.   Psychiatric:        Mood and Affect:  Mood normal.      ED Treatments / Results  Labs (all labs ordered are listed, but only abnormal results are displayed) Results for orders placed or performed during the hospital encounter of 04/02/19  Lipase, blood  Result Value Ref Range   Lipase 19 11 - 51 U/L  Comprehensive metabolic panel  Result Value Ref Range   Sodium 136 135 - 145 mmol/L   Potassium 4.5 3.5 - 5.1 mmol/L   Chloride 95 (L) 98 - 111 mmol/L   CO2 8 (L) 22 - 32 mmol/L   Glucose, Bld 207 (H) 70 - 99 mg/dL   BUN 20 6 - 20 mg/dL   Creatinine, Ser 1.611.75 (H) 0.44 -  1.00 mg/dL   Calcium 9.8 8.9 - 10.3 mg/dL   Total Protein 9.5 (H) 6.5 - 8.1 g/dL   Albumin 5.3 (H) 3.5 - 5.0 g/dL   AST 29 15 - 41 U/L   ALT 32 0 - 44 U/L   Alkaline Phosphatase 117 38 - 126 U/L   Total Bilirubin 1.9 (H) 0.3 - 1.2 mg/dL   GFR calc non Af Amer 38 (L) >60 mL/min   GFR calc Af Amer 44 (L) >60 mL/min   Anion gap 33 (H) 5 - 15  CBC  Result Value Ref Range   WBC 20.7 (H) 4.0 - 10.5 K/uL   RBC 5.11 3.87 - 5.11 MIL/uL   Hemoglobin 17.1 (H) 12.0 - 15.0 g/dL   HCT 53.1 (H) 36.0 - 46.0 %   MCV 103.9 (H) 80.0 - 100.0 fL   MCH 33.5 26.0 - 34.0 pg   MCHC 32.2 30.0 - 36.0 g/dL   RDW 13.3 11.5 - 15.5 %   Platelets 401 (H) 150 - 400 K/uL   nRBC 0.0 0.0 - 0.2 %  I-Stat beta hCG blood, ED  Result Value Ref Range   I-stat hCG, quantitative <5.0 <5 mIU/mL   Comment 3          CBG monitoring, ED  Result Value Ref Range   Glucose-Capillary 241 (H) 70 - 99 mg/dL  CBG monitoring, ED  Result Value Ref Range   Glucose-Capillary 237 (H) 70 - 99 mg/dL   Comment 1 Notify RN    Comment 2 Document in Chart    Dg Chest Port 1 View  Result Date: 03/17/2019 CLINICAL DATA:  Fever and cough for several days EXAM: PORTABLE CHEST 1 VIEW COMPARISON:  01/30/2019 FINDINGS: The heart size and mediastinal contours are within normal limits. Both lungs are clear. The visualized skeletal structures are unremarkable. IMPRESSION: No active disease. Electronically  Signed   By: Inez Catalina M.D.   On: 03/17/2019 16:35    EKG None  Radiology No results found.  Procedures Procedures (including critical care time)  Medications Ordered in ED Medications  sodium chloride flush (NS) 0.9 % injection 3 mL ( Intravenous Canceled Entry 04/02/19 2156)  sodium chloride 0.9 % bolus 1,000 mL (has no administration in time range)  dextrose 5 %-0.45 % sodium chloride infusion (has no administration in time range)  insulin regular, human (MYXREDLIN) 100 units/ 100 mL infusion (has no administration in time range)  promethazine (PHENERGAN) injection 25 mg (has no administration in time range)  hydrochlorothiazide (HYDRODIURIL) tablet 25 mg (has no administration in time range)  ondansetron (ZOFRAN-ODT) disintegrating tablet 4 mg (4 mg Oral Given 04/02/19 2038)     Initial Impression / Assessment and Plan / ED Course  I have reviewed the triage vital signs and the nursing notes.  Pertinent labs & imaging results that were available during my care of the patient were reviewed by me and considered in my medical decision making (see chart for details).  Iv ns bolus. Stat labs. Imaging ordered. Continuous pulse ox and monitor.   Reviewed nursing notes and prior charts for additional history.   covid test added to admission workup.  Additional ns bolus.   Labs reviewed by me - glucose elevated, hco3 is very low c/w dka. Insulin gtt via glucostabilizer.   Patient requests phenergan for nausea - provided.   Medicine service consulted for admission.  Additional fluids.   Resident service is admitting.   Hr improved from prior. Mental status normal  on recheck. No new c/o. Pt feels mildly improved.   CRITICAL CARE RE: DKA, severe dehydration.  Performed by: Suzi Roots Total critical care time: 95 minutes Critical care time was exclusive of separately billable procedures and treating other patients. Critical care was necessary to treat or prevent  imminent or life-threatening deterioration. Critical care was time spent personally by me on the following activities: development of treatment plan with patient and/or surrogate as well as nursing, discussions with consultants, evaluation of patient's response to treatment, examination of patient, obtaining history from patient or surrogate, ordering and performing treatments and interventions, ordering and review of laboratory studies, ordering and review of radiographic studies, pulse oximetry and re-evaluation of patient's condition.   Final Clinical Impressions(s) / ED Diagnoses   Final diagnoses:  None    ED Discharge Orders    None       Cathren Laine, MD 04/02/19 2322

## 2019-04-02 NOTE — H&P (Signed)
Date: 04/02/2019               Patient Name:  Madison Williams MRN: [id number]  DOB: 04-Feb-1986 Age / Sex: 33 y.o., female   PCP: Patient, No Pcp Per         Medical Service: Internal Medicine Teaching Service         Attending Physician: Dr. Inez Catalina, MD    First Contact: Dr. Gwyneth Revels Pager: [phone]  Second Contact: Dr. Darl Pikes Pager: [phone]       After Hours (After 5p/  First Contact Pager: [phone]  weekends / holidays): Second Contact Pager: [phone]   Chief Complaint: Vomiting  History of Present Illness:  Amandy Zientara is a 33 year old female with a pertinent past medical history of type 1 diabetes and asthma.  She presents today with a 24-hour history of nausea and vomiting.  She denies blood or change in color to her emesis.  She tried taking Phenergan for her nausea without success.  The last time the patient was able to eat was yesterday and since then has not been able to keep any food down.  She admits to diffuse abdominal pain that does not radiate.  She had a small bowel movement yesterday but denies presence of blood, diarrhea, or constipation.  Patient admits to polydipsia and polyuria without dysuria.  She denies recent falls or other trauma, nonprescription drug use, changes in medication/missed medications, fever or sick contacts.  Patient takes Lantus once daily and NovoLog 3 times daily for DM. Patient does not need her rescue inhaler weekly, but states that she has used it twice in the last 24 hours for shortness of breath.  She denies chest pain, lower extremity edema, rash, or cough. Patient lives at home with her mom and daughter and works as a Leisure centre manager.   Social:  Social History   Socioeconomic History  . Marital status: Single    Spouse name: Not on file  . Number of children: Not on file  . Years of education: Not on file  . Highest education level: Not on file  Occupational History  . Not on file  Social Needs  . Financial resource  strain: Not on file  . Food insecurity    Worry: Not on file    Inability: Not on file  . Transportation needs    Medical: Not on file    Non-medical: Not on file  Tobacco Use  . Smoking status: Current Every Day Smoker    Packs/day: 1.00  . Smokeless tobacco: Never Used  Substance and Sexual Activity  . Alcohol use: Yes    Comment: rarely  . Drug use: Never  . Sexual activity: Not on file  Lifestyle  . Physical activity    Days per week: Not on file    Minutes per session: Not on file  . Stress: Not on file  Relationships  . Social Musician on phone: Not on file    Gets together: Not on file    Attends religious service: Not on file    Active member of club or organization: Not on file    Attends meetings of clubs or organizations: Not on file    Relationship status: Not on file  . Intimate partner violence    Fear of current or ex partner: Not on file    Emotionally abused: Not on file    Physically abused: Not on file    Forced sexual activity:  Not on file  Other Topics Concern  . Not on file  Social History Narrative  . Not on file     Family History:  History reviewed. No pertinent family history.   Meds:  Current Meds  Medication Sig  . albuterol (PROVENTIL HFA;VENTOLIN HFA) 108 (90 Base) MCG/ACT inhaler Inhale 2 puffs into the lungs every 6 (six) hours as needed for wheezing or shortness of breath.  . insulin aspart (NOVOLOG) 100 UNIT/ML injection Inject 0-9 Units into the skin 3 (three) times daily with meals. (Patient taking differently: Inject 0-9 Units into the skin 3 (three) times daily with meals. Sliding scale)  . insulin glargine (LANTUS) 100 UNIT/ML injection Inject 0.35 mLs (35 Units total) into the skin daily. (Patient taking differently: Inject 30 Units into the skin daily. )     Allergies: Allergies as of 04/02/2019  . (No Known Allergies)   Past Medical History:  Diagnosis Date  . Asthma   . Diabetes mellitus without  complication ([place])   . Rheumatoid arthritis ([place])      Review of Systems: A complete ROS was negative except as per HPI.  Review of Systems  Constitutional: Positive for chills and malaise/fatigue. Negative for fever.  HENT: Negative for congestion and sore throat (but does have dry mouth).   Eyes: Negative for blurred vision, double vision and redness.  Respiratory: Negative for cough, sputum production, shortness of breath and wheezing.   Cardiovascular: Negative for chest pain and palpitations.  Gastrointestinal: Positive for abdominal pain, nausea and vomiting. Negative for blood in stool, constipation, diarrhea and melena.  Genitourinary: Positive for frequency. Negative for dysuria, flank pain and hematuria.  Musculoskeletal: Positive for back pain. Negative for falls, joint pain and myalgias.  Skin: Negative for rash.  Neurological: Negative for focal weakness and headaches.  Endo/Heme/Allergies: Positive for polydipsia.  Psychiatric/Behavioral: Negative for substance abuse.    Physical Exam: Blood pressure (!) 143/64, pulse 67, temperature 98 F (36.7 C), temperature source Oral, resp. rate 18, height 5\' 7"  (1.702 m), weight 54.4 kg, SpO2 100 %.  Previously the patient was tachycardic. Physical Exam  Constitutional: She is oriented to person, place, and time. She appears distressed.  HENT:  Mouth/Throat: Mucous membranes are not pale, dry and not cyanotic. Abnormal dentition. Dental caries present.  Eyes: No scleral icterus.  Neck: Normal range of motion.  Cardiovascular: Normal rate, regular rhythm, normal heart sounds and intact distal pulses. Exam reveals no gallop and no friction rub.  No murmur heard. Pulmonary/Chest: Effort normal. No respiratory distress. She has no wheezes. She exhibits no tenderness.  Abdominal: Soft. Bowel sounds are normal. She exhibits no distension and no mass. There is abdominal tenderness (epigastric/RUQ). There is guarding. There is no  rebound.  Musculoskeletal: Normal range of motion.        General: No edema.     Thoracic back: She exhibits tenderness (point tender).  Neurological: She is alert and oriented to person, place, and time.  Skin: Skin is warm and dry.    Assessment & Plan by Problem: Principal Problem:   DKA (diabetic ketoacidoses) ([place]) Active Problems:   Acute kidney injury ([place])   Leukocytosis   Type 1 diabetes mellitus with hyperglycemia ([place])  DKA: Patient has 24-hour history of nausea and vomiting with abdominal pain.  Her blood sugar in the ED was 241 and UA was positive for ketones.  CBC showed an anion gap metabolic acidosis.  Collectively this indicates a diagnosis of DKA.   Plan: -  Start hydration with normal saline at 100 mL/h and a regular insulin infusion at 1.8 units/h. - BMP every 4 hours. - Supplement potassium less than 5 and dextrose less than 250.  Acute kidney injury: Patient's creatinine has increased from 0.72 to 1.75 since her last basic metabolic panel on 03/17/2019.   Plan: - IV fluids - Repeat BMP in 4 hours to assess creatinine  Leukocytosis: She had an elevated leukocytosis of 20.7 and uncertain etiology.  Work-up was unclear and may be due to her DKA.  Considering patient had significant right upper quadrant pain with guarding, we performed a right upper quadrant ultrasound to rule out acute cholecystitis.  Diet: N.P.O. and ice chips VTE: Enoxaparin 40 mg daily IVF: Normal saline 100 mL/h Code: Full code  Dispo: Admit patient to Inpatient with expected length of stay greater than 2 midnights.  Signed: Dellia Cloud, MD 04/02/2019, 11:22 PM  Pager: [phone]

## 2019-04-03 ENCOUNTER — Inpatient Hospital Stay (HOSPITAL_COMMUNITY): Payer: Self-pay

## 2019-04-03 DIAGNOSIS — R079 Chest pain, unspecified: Secondary | ICD-10-CM

## 2019-04-03 DIAGNOSIS — K219 Gastro-esophageal reflux disease without esophagitis: Secondary | ICD-10-CM

## 2019-04-03 LAB — BASIC METABOLIC PANEL
Anion gap: 16 — ABNORMAL HIGH (ref 5–15)
Anion gap: 20 — ABNORMAL HIGH (ref 5–15)
Anion gap: 19 — ABNORMAL HIGH (ref 5–15)
Anion gap: 14 (ref 5–15)
BUN: 19 mg/dL (ref 6–20)
BUN: 13 mg/dL (ref 6–20)
BUN: 12 mg/dL (ref 6–20)
BUN: 8 mg/dL (ref 6–20)
BUN: 8 mg/dL (ref 6–20)
CO2: <7 mmol/L — ABNORMAL LOW (ref 22–32)
CO2: 13 mmol/L — ABNORMAL LOW (ref 22–32)
CO2: 12 mmol/L — ABNORMAL LOW (ref 22–32)
CO2: 15 mmol/L — ABNORMAL LOW (ref 22–32)
CO2: 19 mmol/L — ABNORMAL LOW (ref 22–32)
Calcium: 9.1 mg/dL (ref 8.9–10.3)
Calcium: 8.2 mg/dL — ABNORMAL LOW (ref 8.9–10.3)
Calcium: 9 mg/dL (ref 8.9–10.3)
Calcium: 9.2 mg/dL (ref 8.9–10.3)
Calcium: 9.2 mg/dL (ref 8.9–10.3)
Chloride: 97 mmol/L — ABNORMAL LOW (ref 98–111)
Chloride: 102 mmol/L (ref 98–111)
Chloride: 101 mmol/L (ref 98–111)
Chloride: 101 mmol/L (ref 98–111)
Chloride: 102 mmol/L (ref 98–111)
Creatinine, Ser: 1.67 mg/dL — ABNORMAL HIGH (ref 0.44–1.00)
Creatinine, Ser: 1.15 mg/dL — ABNORMAL HIGH (ref 0.44–1.00)
Creatinine, Ser: 0.99 mg/dL (ref 0.44–1.00)
Creatinine, Ser: 0.91 mg/dL (ref 0.44–1.00)
Creatinine, Ser: 0.97 mg/dL (ref 0.44–1.00)
GFR calc Af Amer: 46 mL/min — ABNORMAL LOW (ref >60)
GFR calc Af Amer: >60 mL/min (ref >60)
GFR calc Af Amer: >60 mL/min (ref >60)
GFR calc Af Amer: >60 mL/min (ref >60)
GFR calc Af Amer: >60 mL/min (ref >60)
GFR calc non Af Amer: 40 mL/min — ABNORMAL LOW (ref >60)
GFR calc non Af Amer: >60 mL/min (ref >60)
GFR calc non Af Amer: >60 mL/min (ref >60)
GFR calc non Af Amer: >60 mL/min (ref >60)
GFR calc non Af Amer: >60 mL/min (ref >60)
Glucose, Bld: 305 mg/dL — ABNORMAL HIGH (ref 70–99)
Glucose, Bld: 356 mg/dL — ABNORMAL HIGH (ref 70–99)
Glucose, Bld: 235 mg/dL — ABNORMAL HIGH (ref 70–99)
Glucose, Bld: 154 mg/dL — ABNORMAL HIGH (ref 70–99)
Glucose, Bld: 130 mg/dL — ABNORMAL HIGH (ref 70–99)
Potassium: 4.6 mmol/L (ref 3.5–5.1)
Potassium: 3.5 mmol/L (ref 3.5–5.1)
Potassium: 3.9 mmol/L (ref 3.5–5.1)
Potassium: 3.6 mmol/L (ref 3.5–5.1)
Potassium: 3.3 mmol/L — ABNORMAL LOW (ref 3.5–5.1)
Sodium: 136 mmol/L (ref 135–145)
Sodium: 131 mmol/L — ABNORMAL LOW (ref 135–145)
Sodium: 133 mmol/L — ABNORMAL LOW (ref 135–145)
Sodium: 135 mmol/L (ref 135–145)
Sodium: 135 mmol/L (ref 135–145)

## 2019-04-03 LAB — CBG MONITORING, ED
Glucose-Capillary: 114 mg/dL — ABNORMAL HIGH (ref 70–99)
Glucose-Capillary: 118 mg/dL — ABNORMAL HIGH (ref 70–99)
Glucose-Capillary: 128 mg/dL — ABNORMAL HIGH (ref 70–99)
Glucose-Capillary: 130 mg/dL — ABNORMAL HIGH (ref 70–99)
Glucose-Capillary: 133 mg/dL — ABNORMAL HIGH (ref 70–99)
Glucose-Capillary: 144 mg/dL — ABNORMAL HIGH (ref 70–99)
Glucose-Capillary: 157 mg/dL — ABNORMAL HIGH (ref 70–99)
Glucose-Capillary: 159 mg/dL — ABNORMAL HIGH (ref 70–99)
Glucose-Capillary: 164 mg/dL — ABNORMAL HIGH (ref 70–99)
Glucose-Capillary: 204 mg/dL — ABNORMAL HIGH (ref 70–99)
Glucose-Capillary: 205 mg/dL — ABNORMAL HIGH (ref 70–99)
Glucose-Capillary: 206 mg/dL — ABNORMAL HIGH (ref 70–99)
Glucose-Capillary: 211 mg/dL — ABNORMAL HIGH (ref 70–99)
Glucose-Capillary: 225 mg/dL — ABNORMAL HIGH (ref 70–99)
Glucose-Capillary: 232 mg/dL — ABNORMAL HIGH (ref 70–99)

## 2019-04-03 LAB — LACTIC ACID, PLASMA: Lactic Acid, Venous: 1.8 mmol/L (ref 0.5–1.9)

## 2019-04-03 LAB — CBC WITH DIFFERENTIAL/PLATELET
Abs Immature Granulocytes: 0.09 10*3/uL — ABNORMAL HIGH (ref 0.00–0.07)
Basophils Absolute: 0 10*3/uL (ref 0.0–0.1)
Basophils Relative: 0 %
Eosinophils Absolute: 0 10*3/uL (ref 0.0–0.5)
Eosinophils Relative: 0 %
HCT: 52.9 % — ABNORMAL HIGH (ref 36.0–46.0)
Hemoglobin: 16.1 g/dL — ABNORMAL HIGH (ref 12.0–15.0)
Immature Granulocytes: 1 %
Lymphocytes Relative: 7 %
Lymphs Abs: 1.1 10*3/uL (ref 0.7–4.0)
MCH: 32.8 pg (ref 26.0–34.0)
MCHC: 30.4 g/dL (ref 30.0–36.0)
MCV: 107.7 fL — ABNORMAL HIGH (ref 80.0–100.0)
Monocytes Absolute: 0.4 10*3/uL (ref 0.1–1.0)
Monocytes Relative: 3 %
Neutro Abs: 13 10*3/uL — ABNORMAL HIGH (ref 1.7–7.7)
Neutrophils Relative %: 89 %
Platelets: 303 10*3/uL (ref 150–400)
RBC: 4.91 MIL/uL (ref 3.87–5.11)
RDW: 13.5 % (ref 11.5–15.5)
WBC: 14.6 10*3/uL — ABNORMAL HIGH (ref 4.0–10.5)
nRBC: 0 % (ref 0.0–0.2)

## 2019-04-03 LAB — GLUCOSE, CAPILLARY
Glucose-Capillary: 172 mg/dL — ABNORMAL HIGH (ref 70–99)
Glucose-Capillary: 211 mg/dL — ABNORMAL HIGH (ref 70–99)
Glucose-Capillary: 186 mg/dL — ABNORMAL HIGH (ref 70–99)
Glucose-Capillary: 135 mg/dL — ABNORMAL HIGH (ref 70–99)
Glucose-Capillary: 124 mg/dL — ABNORMAL HIGH (ref 70–99)
Glucose-Capillary: 150 mg/dL — ABNORMAL HIGH (ref 70–99)
Glucose-Capillary: 194 mg/dL — ABNORMAL HIGH (ref 70–99)

## 2019-04-03 LAB — SARS CORONAVIRUS 2 (TAT 6-24 HRS): SARS Coronavirus 2: NEGATIVE

## 2019-04-03 LAB — CBC
HCT: 39.9 % (ref 36.0–46.0)
Hemoglobin: 12.8 g/dL (ref 12.0–15.0)
MCH: 32.9 pg (ref 26.0–34.0)
MCHC: 32.1 g/dL (ref 30.0–36.0)
MCV: 102.6 fL — ABNORMAL HIGH (ref 80.0–100.0)
Platelets: 284 10*3/uL (ref 150–400)
RBC: 3.89 MIL/uL (ref 3.87–5.11)
RDW: 13.4 % (ref 11.5–15.5)
WBC: 11.6 10*3/uL — ABNORMAL HIGH (ref 4.0–10.5)
nRBC: 0 % (ref 0.0–0.2)

## 2019-04-03 LAB — HEMOGLOBIN A1C
Hgb A1c MFr Bld: 11.1 % — ABNORMAL HIGH (ref 4.8–5.6)
Mean Plasma Glucose: 271.87 mg/dL

## 2019-04-03 LAB — BILIRUBIN, DIRECT: Bilirubin, Direct: 0.3 mg/dL — ABNORMAL HIGH (ref 0.0–0.2)

## 2019-04-03 LAB — PHOSPHORUS: Phosphorus: 6 mg/dL — ABNORMAL HIGH (ref 2.5–4.6)

## 2019-04-03 LAB — HIV ANTIBODY (ROUTINE TESTING W REFLEX): HIV Screen 4th Generation wRfx: NONREACTIVE

## 2019-04-03 LAB — MAGNESIUM: Magnesium: 2.2 mg/dL (ref 1.7–2.4)

## 2019-04-03 MED ORDER — SODIUM CHLORIDE 0.9 % IV BOLUS
2000.0000 mL | Freq: Once | INTRAVENOUS | Status: AC
  Administered 2019-04-03: 02:00:00 2000 mL via INTRAVENOUS

## 2019-04-03 MED ORDER — KCL IN DEXTROSE-NACL 20-5-0.45 MEQ/L-%-% IV SOLN
INTRAVENOUS | Status: DC
  Administered 2019-04-03 (×2): via INTRAVENOUS
  Filled 2019-04-03 (×4): qty 1000

## 2019-04-03 MED ORDER — PROMETHAZINE HCL 25 MG/ML IJ SOLN
12.5000 mg | Freq: Four times a day (QID) | INTRAMUSCULAR | Status: DC | PRN
  Administered 2019-04-03 – 2019-04-04 (×4): 12.5 mg via INTRAVENOUS
  Filled 2019-04-03 (×5): qty 1

## 2019-04-03 MED ORDER — POTASSIUM CHLORIDE 10 MEQ/100ML IV SOLN
10.0000 meq | INTRAVENOUS | Status: AC
  Administered 2019-04-03: 10 meq via INTRAVENOUS
  Filled 2019-04-03 (×2): qty 100

## 2019-04-03 MED ORDER — PANTOPRAZOLE SODIUM 40 MG IV SOLR
40.0000 mg | Freq: Two times a day (BID) | INTRAVENOUS | Status: DC
  Administered 2019-04-03 – 2019-04-04 (×4): 40 mg via INTRAVENOUS
  Filled 2019-04-03 (×5): qty 40

## 2019-04-03 MED ORDER — CYCLOBENZAPRINE HCL 10 MG PO TABS: 10.0000 mg | ORAL_TABLET | Freq: Three times a day (TID) | ORAL | Status: DC | PRN

## 2019-04-03 NOTE — Progress Notes (Signed)
Inpatient Diabetes Program Recommendations  AACE/ADA: New Consensus Statement on Inpatient Glycemic Control (2015)  Target Ranges:  Prepandial:   less than 140 mg/dL      Peak postprandial:   less than 180 mg/dL (1-2 hours)      Critically ill patients:  140 - 180 mg/dL   Lab Results  Component Value Date   GLUCAP 133 (H) 04/03/2019   HGBA1C 11.1 (H) 04/02/2019   Results for Madison Williams, Madison Williams (MRN [id number]) as of 04/03/2019 14:15  Ref. Range 01/31/2019 14:00 04/02/2019 23:55  Hemoglobin A1C Latest Ref Range: 4.8 - 5.6 % 10.4 (H) 11.1 (H)   Review of Glycemic Control Results for Madison Williams, Madison Williams (MRN [id number]) as of 04/03/2019 14:15  Ref. Range 04/03/2019 10:08  Sodium Latest Ref Range: 135 - 145 mmol/L 133 (L)  Potassium Latest Ref Range: 3.5 - 5.1 mmol/L 3.9  Chloride Latest Ref Range: 98 - 111 mmol/L 101  CO2 Latest Ref Range: 22 - 32 mmol/L 12 (L)  Glucose Latest Ref Range: 70 - 99 mg/dL 235 (H)  BUN Latest Ref Range: 6 - 20 mg/dL 12  Creatinine Latest Ref Range: 0.44 - 1.00 mg/dL 0.99  Calcium Latest Ref Range: 8.9 - 10.3 mg/dL 9.0  Anion gap Latest Ref Range: 5 - 15  20 (H)   Diabetes history: DM type 1 Outpatient Diabetes medications: Lantus 30 units, Novolog 0-9 units tid Current orders for Inpatient glycemic control:  IV insulin gtt per DKA protocol  A1c increased to 11.1%  Inpatient Diabetes Program Recommendations:    Patient denies missing insulin doses or rationing insulin. Patient does not have PCP/Endocrinologist, Patient received insulin from last visit from hospital. Informed her of her current A1c level. Patient lethargic but able to answer brief questions.  Will need follow up at our clinics in order to get insulin [employer] as back up  Will need CM consult to follow while here.  Thanks,  Tama Headings RN, MSN, BC-ADM Inpatient Diabetes Coordinator Team Pager [phone] (8a-5p)

## 2019-04-03 NOTE — ED Notes (Addendum)
Per MD D5 1/2 NS to be discontinued and D5 1/2 NS with KCl 67mEq/L to be started instead. One more IVPB 17mEq of KCl to be given per MD.

## 2019-04-03 NOTE — ED Notes (Signed)
ED TO INPATIENT HANDOFF REPORT  ED Nurse Name and Phone #:  Sabino Gasser [phone]  S Name/Age/Gender Madison Williams 33 y.o. female Room/Bed: 028C/028C  Code Status   Code Status: Full Code  Home/SNF/Other Home Patient oriented to: self, place, time and situation Is this baseline? Yes   Triage Complete: Triage complete  Chief Complaint emesis  Triage Note Onset last night vomiting, more than 20 times today.  No diarrhea, fever.  Tested negative for covid around 2 weeks ago.    Allergies No Known Allergies  Level of Care/Admitting Diagnosis ED Disposition    ED Disposition Condition Comment   Admit  Hospital Area: [facility] [[id number]]  Level of Care: Progressive [102]  Covid Evaluation: Confirmed COVID Negative  Diagnosis: DKA (diabetic ketoacidoses) [facility]) [[id number]]  Admitting Physician: Nena Polio  Attending Physician: Inez Catalina [phone]  Estimated length of stay: past midnight tomorrow  Certification:: I certify this patient will need inpatient services for at least 2 midnights  PT Class (Do Not Modify): Inpatient [101]  PT Acc Code (Do Not Modify): Private [1]       B Medical/Surgery History Past Medical History:  Diagnosis Date  . Asthma   . Diabetes mellitus without complication (HCC)   . Rheumatoid arthritis [facility])    Past Surgical History:  Procedure Laterality Date  . LAPAROSCOPIC OVARIAN       A IV Location/Drains/Wounds Patient Lines/Drains/Airways Status   Active Line/Drains/Airways    Name:   Placement date:   Placement time:   Site:   Days:   Peripheral IV 04/02/19 Left Forearm   04/02/19    2208    Forearm   1   Peripheral IV 04/03/19 Right;Anterior;Upper Arm   04/03/19    0026    Arm   less than 1          Intake/Output Last 24 hours  Intake/Output Summary (Last 24 hours) at 04/03/2019 1427 Last data filed at 04/03/2019 1009 Gross per 24 hour  Intake 4023.1 ml  Output -  Net 4023.1 ml     Labs/Imaging Results for orders placed or performed during the hospital encounter of 04/02/19 (from the past 48 hour(s))  Urinalysis, Routine w reflex microscopic     Status: Abnormal   Collection Time: 04/02/19  7:14 PM  Result Value Ref Range   Color, Urine YELLOW YELLOW   APPearance HAZY (A) CLEAR   Specific Gravity, Urine 1.019 1.005 - 1.030   pH 5.0 5.0 - 8.0   Glucose, UA 50 (A) NEGATIVE mg/dL   Hgb urine dipstick SMALL (A) NEGATIVE   Bilirubin Urine NEGATIVE NEGATIVE   Ketones, ur 80 (A) NEGATIVE mg/dL   Protein, ur >=086 (A) NEGATIVE mg/dL   Nitrite NEGATIVE NEGATIVE   Leukocytes,Ua NEGATIVE NEGATIVE   RBC / HPF 0-5 0 - 5 RBC/hpf   WBC, UA 0-5 0 - 5 WBC/hpf   Bacteria, UA NONE SEEN NONE SEEN   Squamous Epithelial / LPF 0-5 0 - 5   Mucus PRESENT    Hyaline Casts, UA PRESENT     Comment: Performed at [facility], [address] [place], [place] [address]  Lipase, blood     Status: None   Collection Time: 04/02/19  7:30 PM  Result Value Ref Range   Lipase 19 11 - 51 U/L    Comment: Performed at [facility], [address] [place], [place] [address]  Comprehensive metabolic panel  Status: Abnormal   Collection Time: 04/02/19  7:30 PM  Result Value Ref Range   Sodium 136 135 - 145 mmol/L   Potassium 4.5 3.5 - 5.1 mmol/L   Chloride 95 (L) 98 - 111 mmol/L   CO2 8 (L) 22 - 32 mmol/L   Glucose, Bld 207 (H) 70 - 99 mg/dL   BUN 20 6 - 20 mg/dL   Creatinine, Ser 1.61 (H) 0.44 - 1.00 mg/dL   Calcium 9.8 8.9 - 09.6 mg/dL   Total Protein 9.5 (H) 6.5 - 8.1 g/dL   Albumin 5.3 (H) 3.5 - 5.0 g/dL   AST 29 15 - 41 U/L   ALT 32 0 - 44 U/L   Alkaline Phosphatase 117 38 - 126 U/L   Total Bilirubin 1.9 (H) 0.3 - 1.2 mg/dL   GFR calc non Af Amer 38 (L) >60 mL/min   GFR calc Af Amer 44 (L) >60 mL/min   Anion gap 33 (H) 5 - 15    Comment: Performed at [facility], [address], [place], [place] [address]  CBC     Status: Abnormal   Collection Time:  04/02/19  7:30 PM  Result Value Ref Range   WBC 20.7 (H) 4.0 - 10.5 K/uL   RBC 5.11 3.87 - 5.11 MIL/uL   Hemoglobin 17.1 (H) 12.0 - 15.0 g/dL   HCT 98.1 (H) 19.1 - 47.8 %   MCV 103.9 (H) 80.0 - 100.0 fL   MCH 33.5 26.0 - 34.0 pg   MCHC 32.2 30.0 - 36.0 g/dL   RDW 29.5 62.1 - 30.8 %   Platelets 401 (H) 150 - 400 K/uL   nRBC 0.0 0.0 - 0.2 %    Comment: Performed at [facility], [address], [place], [place] [address]  I-Stat beta hCG blood, ED     Status: None   Collection Time: 04/02/19  8:22 PM  Result Value Ref Range   I-stat hCG, quantitative <5.0 <5 mIU/mL   Comment 3            Comment:   GEST. AGE      CONC.  (mIU/mL)   <=1 WEEK        5 - 50     2 WEEKS       50 - 500     3 WEEKS       100 - 10,000     4 WEEKS     1,000 - 30,000        FEMALE AND NON-PREGNANT FEMALE:     LESS THAN 5 mIU/mL   CBG monitoring, ED     Status: Abnormal   Collection Time: 04/02/19  9:31 PM  Result Value Ref Range   Glucose-Capillary 241 (H) 70 - 99 mg/dL  Rapid urine drug screen (hospital performed)     Status: None   Collection Time: 04/02/19  9:59 PM  Result Value Ref Range   Opiates NONE DETECTED NONE DETECTED   Cocaine NONE DETECTED NONE DETECTED   Benzodiazepines NONE DETECTED NONE DETECTED   Amphetamines NONE DETECTED NONE DETECTED   Tetrahydrocannabinol NONE DETECTED NONE DETECTED   Barbiturates NONE DETECTED NONE DETECTED    Comment: (NOTE) DRUG SCREEN FOR MEDICAL PURPOSES ONLY.  IF CONFIRMATION IS NEEDED FOR ANY PURPOSE, NOTIFY LAB WITHIN 5 DAYS. LOWEST DETECTABLE LIMITS FOR URINE DRUG SCREEN Drug Class  Cutoff (ng/mL) Amphetamine and metabolites    1000 Barbiturate and metabolites    200 Benzodiazepine                 200 Tricyclics and metabolites     300 Opiates and metabolites        300 Cocaine and metabolites        300 THC                            50 Performed at [facility], [address], [place], [place] [address]   CBG  monitoring, ED     Status: Abnormal   Collection Time: 04/02/19 10:08 PM  Result Value Ref Range   Glucose-Capillary 237 (H) 70 - 99 mg/dL   Comment 1 Notify RN    Comment 2 Document in Chart   SARS CORONAVIRUS 2 (TAT 6-24 HRS) Nasopharyngeal Nasopharyngeal Swab     Status: None   Collection Time: 04/02/19 11:24 PM   Specimen: Nasopharyngeal Swab  Result Value Ref Range   SARS Coronavirus 2 NEGATIVE NEGATIVE    Comment: (NOTE) SARS-CoV-2 target nucleic acids are NOT DETECTED. The SARS-CoV-2 RNA is generally detectable in upper and lower respiratory specimens during the acute phase of infection. Negative results do not preclude SARS-CoV-2 infection, do not rule out co-infections with other pathogens, and should not be used as the sole basis for treatment or other patient management decisions. Negative results must be combined with clinical observations, patient history, and epidemiological information. The expected result is Negative. Fact Sheet for Patients: HairSlick.no Fact Sheet for Healthcare Providers: quierodirigir.com This test is not yet approved or cleared by the [place] FDA and  has been authorized for detection and/or diagnosis of SARS-CoV-2 by FDA under an Emergency Use Authorization (EUA). This EUA will remain  in effect (meaning this test can be used) for the duration of the COVID-19 declaration under Section 56 4(b)(1) of the Act, 21 U.S.C. section 360bbb-3(b)(1), unless the authorization is terminated or revoked sooner. Performed at [facility], [address], [place], [place] [address]   CBG monitoring, ED     Status: Abnormal   Collection Time: 04/02/19 11:40 PM  Result Value Ref Range   Glucose-Capillary 329 (H) 70 - 99 mg/dL  Basic metabolic panel     Status: Abnormal   Collection Time: 04/02/19 11:55 PM  Result Value Ref Range   Sodium 136 135 - 145 mmol/L   Potassium 4.6 3.5 - 5.1  mmol/L   Chloride 97 (L) 98 - 111 mmol/L   CO2 <7 (L) 22 - 32 mmol/L   Glucose, Bld 305 (H) 70 - 99 mg/dL   BUN 19 6 - 20 mg/dL   Creatinine, Ser 3.70 (H) 0.44 - 1.00 mg/dL   Calcium 9.1 8.9 - 48.8 mg/dL   GFR calc non Af Amer 40 (L) >60 mL/min   GFR calc Af Amer 46 (L) >60 mL/min   Anion gap NOT CALCULATED 5 - 15    Comment: Performed at [facility], [address] [place], [place] [address]  Magnesium     Status: None   Collection Time: 04/02/19 11:55 PM  Result Value Ref Range   Magnesium 2.2 1.7 - 2.4 mg/dL    Comment: Performed at [facility], [address], [place], [place] [address]  Phosphorus     Status: Abnormal   Collection Time: 04/02/19 11:55 PM  Result Value Ref Range   Phosphorus 6.0 (H) 2.5 - 4.6 mg/dL    Comment: Performed at [facility] [address] [place], [place] [address]  Hemoglobin A1c     Status: Abnormal   Collection Time: 04/02/19 11:55 PM  Result Value Ref Range   Hgb A1c MFr Bld 11.1 (H) 4.8 - 5.6 %    Comment: (NOTE) Pre diabetes:          5.7%-6.4% Diabetes:              >6.4% Glycemic control for   <7.0% adults with diabetes    Mean Plasma Glucose 271.87 mg/dL    Comment: Performed at [facility] [address], [place], [place] [address]  CBC with Differential/Platelet     Status: Abnormal   Collection Time: 04/02/19 11:55 PM  Result Value Ref Range   WBC 14.6 (H) 4.0 - 10.5 K/uL   RBC 4.91 3.87 - 5.11 MIL/uL   Hemoglobin 16.1 (H) 12.0 - 15.0 g/dL   HCT 52.9 (H) 36.0 - 46.0 %   MCV 107.7 (H) 80.0 - 100.0 fL   MCH 32.8 26.0 - 34.0 pg   MCHC 30.4 30.0 - 36.0 g/dL   RDW 13.5 11.5 - 15.5 %   Platelets 303 150 - 400 K/uL   nRBC 0.0 0.0 - 0.2 %   Neutrophils Relative % 89 %   Neutro Abs 13.0 (H) 1.7 - 7.7 K/uL   Lymphocytes Relative 7 %   Lymphs Abs 1.1 0.7 - 4.0 K/uL   Monocytes Relative 3 %   Monocytes Absolute 0.4 0.1 - 1.0 K/uL   Eosinophils Relative 0 %   Eosinophils Absolute 0.0 0.0 - 0.5 K/uL    Basophils Relative 0 %   Basophils Absolute 0.0 0.0 - 0.1 K/uL   Immature Granulocytes 1 %   Abs Immature Granulocytes 0.09 (H) 0.00 - 0.07 K/uL    Comment: Performed at [facility] [address], [place], [place] [address]  Bilirubin, direct     Status: Abnormal   Collection Time: 04/02/19 11:55 PM  Result Value Ref Range   Bilirubin, Direct 0.3 (H) 0.0 - 0.2 mg/dL    Comment: Performed at [facility] [address], [place], [place] [address]  CBG monitoring, ED     Status: Abnormal   Collection Time: 04/03/19 12:41 AM  Result Value Ref Range   Glucose-Capillary 225 (H) 70 - 99 mg/dL  Lactic acid, plasma     Status: None   Collection Time: 04/03/19  1:16 AM  Result Value Ref Range   Lactic Acid, Venous 1.8 0.5 - 1.9 mmol/L    Comment: Performed at [facility], [place] [address] [place], [place] [address]  CBG monitoring, ED     Status: Abnormal   Collection Time: 04/03/19  2:08 AM  Result Value Ref Range   Glucose-Capillary 130 (H) 70 - 99 mg/dL  CBG monitoring, ED     Status: Abnormal   Collection Time: 04/03/19  3:04 AM  Result Value Ref Range   Glucose-Capillary 159 (H) 70 - 99 mg/dL  CBG monitoring, ED     Status: Abnormal   Collection Time: 04/03/19  4:15 AM  Result Value Ref Range   Glucose-Capillary 211 (H) 70 - 99 mg/dL  CBC     Status: Abnormal   Collection Time: 04/03/19  4:50 AM  Result Value Ref Range   WBC 11.6 (H) 4.0 -  10.5 K/uL   RBC 3.89 3.87 - 5.11 MIL/uL   Hemoglobin 12.8 12.0 - 15.0 g/dL    Comment: REPEATED TO VERIFY   HCT 39.9 36.0 - 46.0 %   MCV 102.6 (H) 80.0 - 100.0 fL   MCH 32.9 26.0 - 34.0 pg   MCHC 32.1 30.0 - 36.0 g/dL   RDW 01.013.4 27.211.5 - 53.615.5 %   Platelets 284 150 - 400 K/uL   nRBC 0.0 0.0 - 0.2 %    Comment: Performed at [facility], [address] [place], [place] [id number]  CBG monitoring, ED     Status: Abnormal   Collection Time: 04/03/19  5:13 AM  Result Value Ref Range   Glucose-Capillary 205 (H) 70 -  99 mg/dL  CBG monitoring, ED     Status: Abnormal   Collection Time: 04/03/19  5:42 AM  Result Value Ref Range   Glucose-Capillary 204 (H) 70 - 99 mg/dL  Basic metabolic panel     Status: Abnormal   Collection Time: 04/03/19  6:10 AM  Result Value Ref Range   Sodium 131 (L) 135 - 145 mmol/L   Potassium 3.5 3.5 - 5.1 mmol/L    Comment: DELTA CHECK NOTED   Chloride 102 98 - 111 mmol/L   CO2 13 (L) 22 - 32 mmol/L   Glucose, Bld 356 (H) 70 - 99 mg/dL   BUN 13 6 - 20 mg/dL   Creatinine, Ser 4.741.15 (H) 0.44 - 1.00 mg/dL   Calcium 8.2 (L) 8.9 - 10.3 mg/dL   GFR calc non Af Amer >60 >60 mL/min   GFR calc Af Amer >60 >60 mL/min   Anion gap 16 (H) 5 - 15    Comment: Performed at [facility], [address] [place], [place] [address]  CBG monitoring, ED     Status: Abnormal   Collection Time: 04/03/19  6:50 AM  Result Value Ref Range   Glucose-Capillary 164 (H) 70 - 99 mg/dL  CBG monitoring, ED     Status: Abnormal   Collection Time: 04/03/19  7:52 AM  Result Value Ref Range   Glucose-Capillary 128 (H) 70 - 99 mg/dL  CBG monitoring, ED     Status: Abnormal   Collection Time: 04/03/19  8:45 AM  Result Value Ref Range   Glucose-Capillary 114 (H) 70 - 99 mg/dL   Comment 1 Notify RN    Comment 2 Document in Chart   CBG monitoring, ED     Status: Abnormal   Collection Time: 04/03/19  9:46 AM  Result Value Ref Range   Glucose-Capillary 206 (H) 70 - 99 mg/dL   Comment 1 Notify RN    Comment 2 Document in Chart   Basic metabolic panel     Status: Abnormal   Collection Time: 04/03/19 10:08 AM  Result Value Ref Range   Sodium 133 (L) 135 - 145 mmol/L   Potassium 3.9 3.5 - 5.1 mmol/L   Chloride 101 98 - 111 mmol/L   CO2 12 (L) 22 - 32 mmol/L   Glucose, Bld 235 (H) 70 - 99 mg/dL   BUN 12 6 - 20 mg/dL   Creatinine, Ser 3.870.99 0.44 - 1.00 mg/dL   Calcium 9.0 8.9 - 56.410.3 mg/dL   GFR calc non Af Amer >60 >60 mL/min   GFR calc Af Amer >60 >60 mL/min   Anion gap 20 (H) 5 - 15    Comment:  Performed at [facility], [address] [place], [place] [address]  CBG monitoring, ED     Status: Abnormal   Collection Time: 04/03/19 11:05 AM  Result Value Ref Range   Glucose-Capillary 232 (H) 70 - 99 mg/dL  CBG monitoring, ED     Status: Abnormal   Collection Time: 04/03/19 12:09 PM  Result Value Ref Range   Glucose-Capillary 157 (H) 70 - 99 mg/dL  CBG monitoring, ED     Status: Abnormal   Collection Time: 04/03/19  1:17 PM  Result Value Ref Range   Glucose-Capillary 133 (H) 70 - 99 mg/dL  CBG monitoring, ED     Status: Abnormal   Collection Time: 04/03/19  2:13 PM  Result Value Ref Range   Glucose-Capillary 118 (H) 70 - 99 mg/dL   US Abdomen Limited Ruq  Result Date: 04/03/2019 CLINICAL DATA:  Initial evaluation for acute right upper quadrant pain for 1 day. EXAM: ULTRASOUND ABDOMEN LIMITED RIGHT UPPER QUADRANT COMPARISON:  None. FINDINGS: Gallbladder: No gallstones or wall thickening visualized. No sonographic Murphy sign noted by sonographer. Common bile duct: Diameter: 2.6 mm Liver: No focal lesion identified. Within normal limits in parenchymal echogenicity. Portal vein is patent on color Doppler imaging with normal direction of blood flow towards the liver. Other: None. IMPRESSION: Normal right upper quadrant ultrasound. No acute abnormality identified. Electronically Signed   By: Rise Mu M.D.   On: 04/03/2019 02:03    Pending Labs Unresulted Labs (From admission, onward)    Start     Ordered   04/03/19 0030  Blood culture (routine x 2)  BLOOD CULTURE X 2,   STAT     04/03/19 0037   04/02/19 2245  HIV antibody (Routine Testing)  Once,   STAT     04/02/19 2255   04/02/19 2245  Basic metabolic panel  STAT Now then every 4 hours ,   STAT     04/02/19 2255          Vitals/Pain Today's Vitals   04/03/19 1330 04/03/19 1345 04/03/19 1400 04/03/19 1415  BP: 113/74 124/76 122/75 129/75  Pulse: 71 75 81 77  Resp: (!) 26 (!) 27 (!) 22 19  Temp:       TempSrc:      SpO2: 98% 98% 98% 99%  Weight:      Height:      PainSc:        Isolation Precautions No active isolations  Medications Medications  sodium chloride flush (NS) 0.9 % injection 3 mL ( Intravenous Canceled Entry 04/02/19 2156)  dextrose 5 %-0.45 % sodium chloride infusion ( Intravenous Stopped 04/03/19 1009)  insulin regular, human (MYXREDLIN) 100 units/ 100 mL infusion (1.2 Units/hr Intravenous Rate/Dose Change 04/03/19 1418)  0.9 %  sodium chloride infusion ( Intravenous Not Given 04/03/19 0851)  enoxaparin (LOVENOX) injection 40 mg (40 mg Subcutaneous Given 04/03/19 1319)  ondansetron (ZOFRAN) injection 4 mg (4 mg Intravenous Given 04/03/19 1225)    Or  ondansetron (ZOFRAN) tablet 4 mg ( Oral See Alternative 04/03/19 1225)  albuterol (PROVENTIL) (2.5 MG/3ML) 0.083% nebulizer solution 2.5 mg (has no administration in time range)  potassium chloride 10 mEq in 100 mL IVPB (10 mEq Intravenous Not Given 04/03/19 1309)  dextrose 5 % and 0.45 % NaCl with KCl 20 mEq/L infusion ( Intravenous New Bag/Given 04/03/19 0854)  promethazine (PHENERGAN) injection 12.5 mg (12.5 mg Intravenous Given 04/03/19 1011)  pantoprazole (PROTONIX) injection 40 mg (40 mg Intravenous Given 04/03/19 1011)  ondansetron (ZOFRAN-ODT) disintegrating tablet 4 mg (  4 mg Oral Given 04/02/19 2038)  sodium chloride 0.9 % bolus 1,000 mL (0 mLs Intravenous Stopped 04/03/19 0422)  promethazine (PHENERGAN) injection 25 mg (25 mg Intramuscular Given 04/02/19 2226)  hydrochlorothiazide (HYDRODIURIL) tablet 25 mg (25 mg Oral Given 04/02/19 2228)  potassium chloride 10 mEq in 100 mL IVPB (0 mEq Intravenous Stopped 04/03/19 0422)  sodium chloride 0.9 % bolus 2,000 mL (0 mLs Intravenous Stopped 04/03/19 0652)    Mobility walks Low fall risk   Focused Assessments Cardiac Assessment Handoff:    No results found for: CKTOTAL, CKMB, CKMBINDEX, TROPONINI No results found for: DDIMER Does the Patient currently have chest pain? No       R Recommendations: See Admitting Provider Note  Report given to:   Additional Notes:

## 2019-04-03 NOTE — ED Notes (Signed)
ED TO INPATIENT HANDOFF REPORT  ED Nurse Name and Phone #:   S Name/Age/Gender Madison Williams 33 y.o. female Room/Bed: 028C/028C  Code Status   Code Status: Full Code  Home/SNF/Other Dc home AO x 4   Triage Complete: Triage complete  Chief Complaint emesis  Triage Note Onset last night vomiting, more than 20 times today.  No diarrhea, fever.  Tested negative for covid around 2 weeks ago.    Allergies No Known Allergies  Level of Care/Admitting Diagnosis ED Disposition    ED Disposition Condition Comment   Admit  Hospital Area: [facility] [[id number]]  Level of Care: Progressive [102]  Covid Evaluation: Confirmed COVID Negative  Diagnosis: DKA (diabetic ketoacidoses) [facility]) [[id number]]  Admitting Physician: Nena Polio  Attending Physician: Inez Catalina [phone]  Estimated length of stay: past midnight tomorrow  Certification:: I certify this patient will need inpatient services for at least 2 midnights  PT Class (Do Not Modify): Inpatient [101]  PT Acc Code (Do Not Modify): Private [1]       B Medical/Surgery History Past Medical History:  Diagnosis Date  . Asthma   . Diabetes mellitus without complication (HCC)   . Rheumatoid arthritis [facility])    Past Surgical History:  Procedure Laterality Date  . LAPAROSCOPIC OVARIAN       A IV Location/Drains/Wounds Patient Lines/Drains/Airways Status   Active Line/Drains/Airways    Name:   Placement date:   Placement time:   Site:   Days:   Peripheral IV 04/02/19 Left Forearm   04/02/19    2208    Forearm   1   Peripheral IV 04/03/19 Right;Anterior;Upper Arm   04/03/19    0026    Arm   less than 1          Intake/Output Last 24 hours  Intake/Output Summary (Last 24 hours) at 04/03/2019 0120 Last data filed at 04/03/2019 0055 Gross per 24 hour  Intake 8.04 ml  Output -  Net 8.04 ml    Labs/Imaging Results for orders placed or performed during the hospital encounter of  04/02/19 (from the past 48 hour(s))  Urinalysis, Routine w reflex microscopic     Status: Abnormal   Collection Time: 04/02/19  7:14 PM  Result Value Ref Range   Color, Urine YELLOW YELLOW   APPearance HAZY (A) CLEAR   Specific Gravity, Urine 1.019 1.005 - 1.030   pH 5.0 5.0 - 8.0   Glucose, UA 50 (A) NEGATIVE mg/dL   Hgb urine dipstick SMALL (A) NEGATIVE   Bilirubin Urine NEGATIVE NEGATIVE   Ketones, ur 80 (A) NEGATIVE mg/dL   Protein, ur >=111 (A) NEGATIVE mg/dL   Nitrite NEGATIVE NEGATIVE   Leukocytes,Ua NEGATIVE NEGATIVE   RBC / HPF 0-5 0 - 5 RBC/hpf   WBC, UA 0-5 0 - 5 WBC/hpf   Bacteria, UA NONE SEEN NONE SEEN   Squamous Epithelial / LPF 0-5 0 - 5   Mucus PRESENT    Hyaline Casts, UA PRESENT     Comment: Performed at [facility], [address], [place], [place] [address]  Lipase, blood     Status: None   Collection Time: 04/02/19  7:30 PM  Result Value Ref Range   Lipase 19 11 - 51 U/L    Comment: Performed at [facility], [address], [place], [place] [address]  Comprehensive metabolic panel     Status: Abnormal   Collection Time: 04/02/19  7:30  PM  Result Value Ref Range   Sodium 136 135 - 145 mmol/L   Potassium 4.5 3.5 - 5.1 mmol/L   Chloride 95 (L) 98 - 111 mmol/L   CO2 8 (L) 22 - 32 mmol/L   Glucose, Bld 207 (H) 70 - 99 mg/dL   BUN 20 6 - 20 mg/dL   Creatinine, Ser 1.75 (H) 0.44 - 1.00 mg/dL   Calcium 9.8 8.9 - 10.3 mg/dL   Total Protein 9.5 (H) 6.5 - 8.1 g/dL   Albumin 5.3 (H) 3.5 - 5.0 g/dL   AST 29 15 - 41 U/L   ALT 32 0 - 44 U/L   Alkaline Phosphatase 117 38 - 126 U/L   Total Bilirubin 1.9 (H) 0.3 - 1.2 mg/dL   GFR calc non Af Amer 38 (L) >60 mL/min   GFR calc Af Amer 44 (L) >60 mL/min   Anion gap 33 (H) 5 - 15    Comment: Performed at [facility], [address], [place], [place] [address]  CBC     Status: Abnormal   Collection Time: 04/02/19  7:30 PM  Result Value Ref Range   WBC 20.7 (H) 4.0 - 10.5 K/uL   RBC 5.11 3.87 -  5.11 MIL/uL   Hemoglobin 17.1 (H) 12.0 - 15.0 g/dL   HCT 53.1 (H) 36.0 - 46.0 %   MCV 103.9 (H) 80.0 - 100.0 fL   MCH 33.5 26.0 - 34.0 pg   MCHC 32.2 30.0 - 36.0 g/dL   RDW 13.3 11.5 - 15.5 %   Platelets 401 (H) 150 - 400 K/uL   nRBC 0.0 0.0 - 0.2 %    Comment: Performed at [place] [address] [place], [place] [address]  I-Stat beta hCG blood, ED     Status: None   Collection Time: 04/02/19  8:22 PM  Result Value Ref Range   I-stat hCG, quantitative <5.0 <5 mIU/mL   Comment 3            Comment:   GEST. AGE      CONC.  (mIU/mL)   <=1 WEEK        5 - 50     2 WEEKS       50 - 500     3 WEEKS       100 - 10,000     4 WEEKS     1,000 - 30,000        FEMALE AND NON-PREGNANT FEMALE:     LESS THAN 5 mIU/mL   CBG monitoring, ED     Status: Abnormal   Collection Time: 04/02/19  9:31 PM  Result Value Ref Range   Glucose-Capillary 241 (H) 70 - 99 mg/dL  Rapid urine drug screen (hospital performed)     Status: None   Collection Time: 04/02/19  9:59 PM  Result Value Ref Range   Opiates NONE DETECTED NONE DETECTED   Cocaine NONE DETECTED NONE DETECTED   Benzodiazepines NONE DETECTED NONE DETECTED   Amphetamines NONE DETECTED NONE DETECTED   Tetrahydrocannabinol NONE DETECTED NONE DETECTED   Barbiturates NONE DETECTED NONE DETECTED    Comment: (NOTE) DRUG SCREEN FOR MEDICAL PURPOSES ONLY.  IF CONFIRMATION IS NEEDED FOR ANY PURPOSE, NOTIFY LAB WITHIN 5 DAYS. LOWEST DETECTABLE LIMITS FOR URINE DRUG SCREEN Drug Class                     Cutoff (ng/mL) Amphetamine and metabolites  1000 Barbiturate and metabolites    200 Benzodiazepine                 200 Tricyclics and metabolites     300 Opiates and metabolites        300 Cocaine and metabolites        300 THC                            50 Performed at [facility], [address] [place], [place] [address]   CBG monitoring, ED     Status: Abnormal   Collection Time: 04/02/19 10:08 PM  Result Value Ref  Range   Glucose-Capillary 237 (H) 70 - 99 mg/dL   Comment 1 Notify RN    Comment 2 Document in Chart   CBG monitoring, ED     Status: Abnormal   Collection Time: 04/02/19 11:40 PM  Result Value Ref Range   Glucose-Capillary 329 (H) 70 - 99 mg/dL  Basic metabolic panel     Status: Abnormal   Collection Time: 04/02/19 11:55 PM  Result Value Ref Range   Sodium 136 135 - 145 mmol/L   Potassium 4.6 3.5 - 5.1 mmol/L   Chloride 97 (L) 98 - 111 mmol/L   CO2 <7 (L) 22 - 32 mmol/L   Glucose, Bld 305 (H) 70 - 99 mg/dL   BUN 19 6 - 20 mg/dL   Creatinine, Ser 6.041.67 (H) 0.44 - 1.00 mg/dL   Calcium 9.1 8.9 - 54.010.3 mg/dL   GFR calc non Af Amer 40 (L) >60 mL/min   GFR calc Af Amer 46 (L) >60 mL/min   Anion gap NOT CALCULATED 5 - 15    Comment: Performed at [facility], [address] [place], [place] [address]  Magnesium     Status: None   Collection Time: 04/02/19 11:55 PM  Result Value Ref Range   Magnesium 2.2 1.7 - 2.4 mg/dL    Comment: Performed at [facility], [address] [place], [place] [address]  Phosphorus     Status: Abnormal   Collection Time: 04/02/19 11:55 PM  Result Value Ref Range   Phosphorus 6.0 (H) 2.5 - 4.6 mg/dL    Comment: Performed at [facility], [address] [place], [place] [address]  Hemoglobin A1c     Status: Abnormal   Collection Time: 04/02/19 11:55 PM  Result Value Ref Range   Hgb A1c MFr Bld 11.1 (H) 4.8 - 5.6 %    Comment: (NOTE) Pre diabetes:          5.7%-6.4% Diabetes:              >6.4% Glycemic control for   <7.0% adults with diabetes    Mean Plasma Glucose 271.87 mg/dL    Comment: Performed at [facility]  Lab, [address] [place], [place] [address]  CBC with Differential/Platelet     Status: Abnormal   Collection Time: 04/02/19 11:55 PM  Result Value Ref Range   WBC 14.6 (H) 4.0 - 10.5 K/uL   RBC 4.91 3.87 - 5.11 MIL/uL   Hemoglobin 16.1 (H) 12.0 - 15.0 g/dL   HCT 78.452.9 (H) 69.636.0 - 29.546.0 %   MCV 107.7 (H) 80.0 -  100.0 fL   MCH 32.8 26.0 - 34.0 pg   MCHC 30.4 30.0 - 36.0 g/dL   RDW 28.413.5 13.211.5 - 44.015.5 %   Platelets  303 150 - 400 K/uL   nRBC 0.0 0.0 - 0.2 %   Neutrophils Relative % 89 %   Neutro Abs 13.0 (H) 1.7 - 7.7 K/uL   Lymphocytes Relative 7 %   Lymphs Abs 1.1 0.7 - 4.0 K/uL   Monocytes Relative 3 %   Monocytes Absolute 0.4 0.1 - 1.0 K/uL   Eosinophils Relative 0 %   Eosinophils Absolute 0.0 0.0 - 0.5 K/uL   Basophils Relative 0 %   Basophils Absolute 0.0 0.0 - 0.1 K/uL   Immature Granulocytes 1 %   Abs Immature Granulocytes 0.09 (H) 0.00 - 0.07 K/uL    Comment: Performed at [facility], [address] [place], [place] [address]  Bilirubin, direct     Status: Abnormal   Collection Time: 04/02/19 11:55 PM  Result Value Ref Range   Bilirubin, Direct 0.3 (H) 0.0 - 0.2 mg/dL    Comment: Performed at [facility], [address] [place], [place] [address]  CBG monitoring, ED     Status: Abnormal   Collection Time: 04/03/19 12:41 AM  Result Value Ref Range   Glucose-Capillary 225 (H) 70 - 99 mg/dL   No results found.  Pending Labs Unresulted Labs (From admission, onward)    Start     Ordered   04/03/19 0043  Lactic acid, plasma  STAT Now then every 3 hours,   R (with STAT occurrences)     04/03/19 0042   04/03/19 0030  Blood culture (routine x 2)  BLOOD CULTURE X 2,   STAT     04/03/19 0037   04/02/19 2351  SARS CORONAVIRUS 2 (TAT 6-24 HRS) Nasopharyngeal Nasopharyngeal Swab  (Asymptomatic/Tier 2)  Once,   STAT    Question Answer Comment  Is this test for diagnosis or screening Screening   Symptomatic for COVID-19 as defined by CDC No   Hospitalized for COVID-19 No   Admitted to ICU for COVID-19 No   Previously tested for COVID-19 Yes   Resident in a congregate (group) care setting No   Employed in healthcare setting No   Pregnant Unknown      04/02/19 2351   04/02/19 2324  SARS CORONAVIRUS 2 (TAT 6-24 HRS) Nasopharyngeal Nasopharyngeal Swab  (Asymptomatic/Tier 2  Patients Labs)  Once,   STAT    Question Answer Comment  Is this test for diagnosis or screening Screening   Symptomatic for COVID-19 as defined by CDC No   Hospitalized for COVID-19 No   Admitted to ICU for COVID-19 No   Previously tested for COVID-19 Yes   Resident in a congregate (group) care setting No   Employed in healthcare setting No   Pregnant No      04/02/19 2323   04/02/19 2245  HIV antibody (Routine Testing)  Once,   STAT     04/02/19 2255   04/02/19 2245  Basic metabolic panel  STAT Now then every 4 hours ,   STAT     04/02/19 2255          Vitals/Pain Today's Vitals   04/02/19 1914 04/02/19 2136 04/02/19 2228 04/02/19 2345  BP:  (!) 143/64  125/70  Pulse:  67  86  Resp:  18  (!) 35  Temp:  98 F (36.7 C)    TempSrc:  Oral    SpO2:  100%  99%  Weight:   54.4 kg   Height:   5\' 7"  (1.702 m)   PainSc: 0-No pain  Isolation Precautions No active isolations  Medications Medications  sodium chloride flush (NS) 0.9 % injection 3 mL ( Intravenous Canceled Entry 04/02/19 2156)  dextrose 5 %-0.45 % sodium chloride infusion (has no administration in time range)  insulin regular, human (MYXREDLIN) 100 units/ 100 mL infusion ( Intravenous Rate/Dose Verify 04/03/19 0055)  0.9 %  sodium chloride infusion ( Intravenous Not Given 04/03/19 0112)  enoxaparin (LOVENOX) injection 40 mg (has no administration in time range)  ondansetron (ZOFRAN) injection 4 mg (has no administration in time range)    Or  ondansetron (ZOFRAN) tablet 4 mg (has no administration in time range)  potassium chloride 10 mEq in 100 mL IVPB (10 mEq Intravenous New Bag/Given 04/03/19 0040)  albuterol (PROVENTIL) (2.5 MG/3ML) 0.083% nebulizer solution 2.5 mg (has no administration in time range)  sodium chloride 0.9 % bolus 2,000 mL (has no administration in time range)  ondansetron (ZOFRAN-ODT) disintegrating tablet 4 mg (4 mg Oral Given 04/02/19 2038)  sodium chloride 0.9 % bolus 1,000 mL (1,000 mLs  Intravenous New Bag/Given 04/02/19 2223)  promethazine (PHENERGAN) injection 25 mg (25 mg Intramuscular Given 04/02/19 2226)  hydrochlorothiazide (HYDRODIURIL) tablet 25 mg (25 mg Oral Given 04/02/19 2228)    Mobility Self care Low fall risk   Focused Assessments AO x 4, SR on the monitor on insulin drip.   R Recommendations: See Admitting Provider Note  Report given to:   Additional Notes:

## 2019-04-03 NOTE — Progress Notes (Signed)
Subjective: Madison Williams is feeling nauseous this morning and also reports some chest pain she says is likely acid reflux from the emesis she had prior. She reports that she takes her medications for diabetes regularly but that she hasn't followed up regularly with a doctor to titrate her medications. She has some elevated blood sugars as high as 370s before she came in. She occasionally has some hypoglycemic episodes. She denies infectious sx such as fever, runny nose, cough and dysuria. She states she has had a few other recent admissions for DKA with no known precipitant.    Objective:  Vital signs in last 24 hours: Vitals:   04/03/19 1415 04/03/19 1430 04/03/19 1445 04/03/19 1500  BP: 129/75 136/83 (!) 141/85 133/84  Pulse: 77 71 70 67  Resp: 19 18 (!) 21 19  Temp:      TempSrc:      SpO2: 99% 99% 98% 99%  Weight:      Height:       Physical Exam  Constitutional: She is oriented to person, place, and time.  Thin appearing female lying curled up in bed   Cardiovascular: Normal rate, regular rhythm, normal heart sounds and intact distal pulses.  No murmur heard. Pulmonary/Chest: Effort normal and breath sounds normal. No respiratory distress. She exhibits no tenderness.  Abdominal: Soft. Bowel sounds are normal. She exhibits no distension. There is no abdominal tenderness.  Musculoskeletal: Normal range of motion.  Neurological: She is alert and oriented to person, place, and time.  Skin: Skin is warm. No erythema.  Nursing note and vitals reviewed.  Labs:  Results for orders placed or performed during the hospital encounter of 04/02/19 (from the past 24 hour(s))  Urinalysis, Routine w reflex microscopic     Status: Abnormal   Collection Time: 04/02/19  7:14 PM  Result Value Ref Range   Color, Urine YELLOW YELLOW   APPearance HAZY (A) CLEAR   Specific Gravity, Urine 1.019 1.005 - 1.030   pH 5.0 5.0 - 8.0   Glucose, UA 50 (A) NEGATIVE mg/dL   Hgb urine dipstick SMALL (A)  NEGATIVE   Bilirubin Urine NEGATIVE NEGATIVE   Ketones, ur 80 (A) NEGATIVE mg/dL   Protein, ur >=161>=300 (A) NEGATIVE mg/dL   Nitrite NEGATIVE NEGATIVE   Leukocytes,Ua NEGATIVE NEGATIVE   RBC / HPF 0-5 0 - 5 RBC/hpf   WBC, UA 0-5 0 - 5 WBC/hpf   Bacteria, UA NONE SEEN NONE SEEN   Squamous Epithelial / LPF 0-5 0 - 5   Mucus PRESENT    Hyaline Casts, UA PRESENT   Lipase, blood     Status: None   Collection Time: 04/02/19  7:30 PM  Result Value Ref Range   Lipase 19 11 - 51 U/L  Comprehensive metabolic panel     Status: Abnormal   Collection Time: 04/02/19  7:30 PM  Result Value Ref Range   Sodium 136 135 - 145 mmol/L   Potassium 4.5 3.5 - 5.1 mmol/L   Chloride 95 (L) 98 - 111 mmol/L   CO2 8 (L) 22 - 32 mmol/L   Glucose, Bld 207 (H) 70 - 99 mg/dL   BUN 20 6 - 20 mg/dL   Creatinine, Ser 0.961.75 (H) 0.44 - 1.00 mg/dL   Calcium 9.8 8.9 - 04.510.3 mg/dL   Total Protein 9.5 (H) 6.5 - 8.1 g/dL   Albumin 5.3 (H) 3.5 - 5.0 g/dL   AST 29 15 - 41 U/L   ALT 32 0 -  44 U/L   Alkaline Phosphatase 117 38 - 126 U/L   Total Bilirubin 1.9 (H) 0.3 - 1.2 mg/dL   GFR calc non Af Amer 38 (L) >60 mL/min   GFR calc Af Amer 44 (L) >60 mL/min   Anion gap 33 (H) 5 - 15  CBC     Status: Abnormal   Collection Time: 04/02/19  7:30 PM  Result Value Ref Range   WBC 20.7 (H) 4.0 - 10.5 K/uL   RBC 5.11 3.87 - 5.11 MIL/uL   Hemoglobin 17.1 (H) 12.0 - 15.0 g/dL   HCT 53.1 (H) 36.0 - 46.0 %   MCV 103.9 (H) 80.0 - 100.0 fL   MCH 33.5 26.0 - 34.0 pg   MCHC 32.2 30.0 - 36.0 g/dL   RDW 13.3 11.5 - 15.5 %   Platelets 401 (H) 150 - 400 K/uL   nRBC 0.0 0.0 - 0.2 %  I-Stat beta hCG blood, ED     Status: None   Collection Time: 04/02/19  8:22 PM  Result Value Ref Range   I-stat hCG, quantitative <5.0 <5 mIU/mL   Comment 3          CBG monitoring, ED     Status: Abnormal   Collection Time: 04/02/19  9:31 PM  Result Value Ref Range   Glucose-Capillary 241 (H) 70 - 99 mg/dL  Rapid urine drug screen (hospital performed)      Status: None   Collection Time: 04/02/19  9:59 PM  Result Value Ref Range   Opiates NONE DETECTED NONE DETECTED   Cocaine NONE DETECTED NONE DETECTED   Benzodiazepines NONE DETECTED NONE DETECTED   Amphetamines NONE DETECTED NONE DETECTED   Tetrahydrocannabinol NONE DETECTED NONE DETECTED   Barbiturates NONE DETECTED NONE DETECTED  CBG monitoring, ED     Status: Abnormal   Collection Time: 04/02/19 10:08 PM  Result Value Ref Range   Glucose-Capillary 237 (H) 70 - 99 mg/dL   Comment 1 Notify RN    Comment 2 Document in Chart   SARS CORONAVIRUS 2 (TAT 6-24 HRS) Nasopharyngeal Nasopharyngeal Swab     Status: None   Collection Time: 04/02/19 11:24 PM   Specimen: Nasopharyngeal Swab  Result Value Ref Range   SARS Coronavirus 2 NEGATIVE NEGATIVE  CBG monitoring, ED     Status: Abnormal   Collection Time: 04/02/19 11:40 PM  Result Value Ref Range   Glucose-Capillary 329 (H) 70 - 99 mg/dL  HIV antibody (Routine Testing)     Status: None   Collection Time: 04/02/19 11:55 PM  Result Value Ref Range   HIV Screen 4th Generation wRfx Non Reactive Non Reactive  Basic metabolic panel     Status: Abnormal   Collection Time: 04/02/19 11:55 PM  Result Value Ref Range   Sodium 136 135 - 145 mmol/L   Potassium 4.6 3.5 - 5.1 mmol/L   Chloride 97 (L) 98 - 111 mmol/L   CO2 <7 (L) 22 - 32 mmol/L   Glucose, Bld 305 (H) 70 - 99 mg/dL   BUN 19 6 - 20 mg/dL   Creatinine, Ser 1.67 (H) 0.44 - 1.00 mg/dL   Calcium 9.1 8.9 - 10.3 mg/dL   GFR calc non Af Amer 40 (L) >60 mL/min   GFR calc Af Amer 46 (L) >60 mL/min   Anion gap NOT CALCULATED 5 - 15  Magnesium     Status: None   Collection Time: 04/02/19 11:55 PM  Result Value Ref Range   Magnesium 2.2  1.7 - 2.4 mg/dL  Phosphorus     Status: Abnormal   Collection Time: 04/02/19 11:55 PM  Result Value Ref Range   Phosphorus 6.0 (H) 2.5 - 4.6 mg/dL  Hemoglobin [id number]     Status: Abnormal   Collection Time: 04/02/19 11:55 PM  Result Value Ref Range    Hgb A1c MFr Bld 11.1 (H) 4.8 - 5.6 %   Mean Plasma Glucose 271.87 mg/dL  CBC with Differential/Platelet     Status: Abnormal   Collection Time: 04/02/19 11:55 PM  Result Value Ref Range   WBC 14.6 (H) 4.0 - 10.5 K/uL   RBC 4.91 3.87 - 5.11 MIL/uL   Hemoglobin 16.1 (H) 12.0 - 15.0 g/dL   HCT 81.152.9 (H) 91.436.0 - 78.246.0 %   MCV 107.7 (H) 80.0 - 100.0 fL   MCH 32.8 26.0 - 34.0 pg   MCHC 30.4 30.0 - 36.0 g/dL   RDW 95.613.5 21.311.5 - 08.615.5 %   Platelets 303 150 - 400 K/uL   nRBC 0.0 0.0 - 0.2 %   Neutrophils Relative % 89 %   Neutro Abs 13.0 (H) 1.7 - 7.7 K/uL   Lymphocytes Relative 7 %   Lymphs Abs 1.1 0.7 - 4.0 K/uL   Monocytes Relative 3 %   Monocytes Absolute 0.4 0.1 - 1.0 K/uL   Eosinophils Relative 0 %   Eosinophils Absolute 0.0 0.0 - 0.5 K/uL   Basophils Relative 0 %   Basophils Absolute 0.0 0.0 - 0.1 K/uL   Immature Granulocytes 1 %   Abs Immature Granulocytes 0.09 (H) 0.00 - 0.07 K/uL  Bilirubin, direct     Status: Abnormal   Collection Time: 04/02/19 11:55 PM  Result Value Ref Range   Bilirubin, Direct 0.3 (H) 0.0 - 0.2 mg/dL  CBG monitoring, ED     Status: Abnormal   Collection Time: 04/03/19 12:41 AM  Result Value Ref Range   Glucose-Capillary 225 (H) 70 - 99 mg/dL  Lactic acid, plasma     Status: None   Collection Time: 04/03/19  1:16 AM  Result Value Ref Range   Lactic Acid, Venous 1.8 0.5 - 1.9 mmol/L  CBG monitoring, ED     Status: Abnormal   Collection Time: 04/03/19  2:08 AM  Result Value Ref Range   Glucose-Capillary 130 (H) 70 - 99 mg/dL  CBG monitoring, ED     Status: Abnormal   Collection Time: 04/03/19  3:04 AM  Result Value Ref Range   Glucose-Capillary 159 (H) 70 - 99 mg/dL  CBG monitoring, ED     Status: Abnormal   Collection Time: 04/03/19  4:15 AM  Result Value Ref Range   Glucose-Capillary 211 (H) 70 - 99 mg/dL  CBC     Status: Abnormal   Collection Time: 04/03/19  4:50 AM  Result Value Ref Range   WBC 11.6 (H) 4.0 - 10.5 K/uL   RBC 3.89 3.87 - 5.11  MIL/uL   Hemoglobin 12.8 12.0 - 15.0 g/dL   HCT 57.839.9 46.936.0 - 62.946.0 %   MCV 102.6 (H) 80.0 - 100.0 fL   MCH 32.9 26.0 - 34.0 pg   MCHC 32.1 30.0 - 36.0 g/dL   RDW 52.813.4 41.311.5 - 24.415.5 %   Platelets 284 150 - 400 K/uL   nRBC 0.0 0.0 - 0.2 %  CBG monitoring, ED     Status: Abnormal   Collection Time: 04/03/19  5:13 AM  Result Value Ref Range   Glucose-Capillary 205 (H) 70 - 99  mg/dL  CBG monitoring, ED     Status: Abnormal   Collection Time: 04/03/19  5:42 AM  Result Value Ref Range   Glucose-Capillary 204 (H) 70 - 99 mg/dL  Basic metabolic panel     Status: Abnormal   Collection Time: 04/03/19  6:10 AM  Result Value Ref Range   Sodium 131 (L) 135 - 145 mmol/L   Potassium 3.5 3.5 - 5.1 mmol/L   Chloride 102 98 - 111 mmol/L   CO2 13 (L) 22 - 32 mmol/L   Glucose, Bld 356 (H) 70 - 99 mg/dL   BUN 13 6 - 20 mg/dL   Creatinine, Ser 8.11 (H) 0.44 - 1.00 mg/dL   Calcium 8.2 (L) 8.9 - 10.3 mg/dL   GFR calc non Af Amer >60 >60 mL/min   GFR calc Af Amer >60 >60 mL/min   Anion gap 16 (H) 5 - 15  CBG monitoring, ED     Status: Abnormal   Collection Time: 04/03/19  6:50 AM  Result Value Ref Range   Glucose-Capillary 164 (H) 70 - 99 mg/dL  CBG monitoring, ED     Status: Abnormal   Collection Time: 04/03/19  7:52 AM  Result Value Ref Range   Glucose-Capillary 128 (H) 70 - 99 mg/dL  CBG monitoring, ED     Status: Abnormal   Collection Time: 04/03/19  8:45 AM  Result Value Ref Range   Glucose-Capillary 114 (H) 70 - 99 mg/dL   Comment 1 Notify RN    Comment 2 Document in Chart   CBG monitoring, ED     Status: Abnormal   Collection Time: 04/03/19  9:46 AM  Result Value Ref Range   Glucose-Capillary 206 (H) 70 - 99 mg/dL   Comment 1 Notify RN    Comment 2 Document in Chart   Basic metabolic panel     Status: Abnormal   Collection Time: 04/03/19 10:08 AM  Result Value Ref Range   Sodium 133 (L) 135 - 145 mmol/L   Potassium 3.9 3.5 - 5.1 mmol/L   Chloride 101 98 - 111 mmol/L   CO2 12 (L) 22  - 32 mmol/L   Glucose, Bld 235 (H) 70 - 99 mg/dL   BUN 12 6 - 20 mg/dL   Creatinine, Ser 9.14 0.44 - 1.00 mg/dL   Calcium 9.0 8.9 - 78.2 mg/dL   GFR calc non Af Amer >60 >60 mL/min   GFR calc Af Amer >60 >60 mL/min   Anion gap 20 (H) 5 - 15  CBG monitoring, ED     Status: Abnormal   Collection Time: 04/03/19 11:05 AM  Result Value Ref Range   Glucose-Capillary 232 (H) 70 - 99 mg/dL  CBG monitoring, ED     Status: Abnormal   Collection Time: 04/03/19 12:09 PM  Result Value Ref Range   Glucose-Capillary 157 (H) 70 - 99 mg/dL  CBG monitoring, ED     Status: Abnormal   Collection Time: 04/03/19  1:17 PM  Result Value Ref Range   Glucose-Capillary 133 (H) 70 - 99 mg/dL  CBG monitoring, ED     Status: Abnormal   Collection Time: 04/03/19  2:13 PM  Result Value Ref Range   Glucose-Capillary 118 (H) 70 - 99 mg/dL   Imaging:  RUQ [place]: normal, no acute abnormality identified  Assessment/Plan:  Assessment: Ms. Hetzer is a 33 yo F with a PMHx of T1D and asthma who presented with a 24 hr hx of N/V/mailaise with elevated blood  glucose to 241, a UA positive for ketones and an anion gap metabolic acidosis suggestive of DKA  Receiving IV fluids and an insulin drip with decrease in her anion gap without closure as of yet.   Plan: Principal Problem:   DKA (diabetic ketoacidoses) (HCC)/Type 1 diabetes mellitus with hyperglycemia (HCC) -pt w/ hx T1D w/ 24 hr hx N/V/mailaise w/ elevated glucose, ketonuria and an anion gap metabolic acidosis concerning for DKA; multiple prior admissions for DKA from increasingly uncontrolled diabetes (Hgb A1c of 11.1 today and 10.4 2 mo ago) -on IV fluids and insulin drip with decrease in gap from 20 to 16 -continue fluids, continue insulin drip -BMP q4 hrs -need good fu and primary care when closer to dispo  Active Problems:   Acute kidney injury (HCC) -Creatinine on admission of 1.75 which is increased from baseline of 0.70 likely prerenal secondary to  dehydration from N/V/DKA -responding well to IVF with decrease to 0.99 today -continue IVF and fu w/ BMP    Leukocytosis  -WBC of 20.7 on admission which has continued to downtrend along w/ Hgb and platelets suggestive of a leukoconcentration rather than a true leukocytosis, currently 11.6 -will continue following w/ CBC  Dispo: Anticipated discharge in 1-2 days.   Jenell Milliner, MD 04/03/2019, 3:25 PM Pager: [phone]

## 2019-04-03 NOTE — ED Notes (Signed)
This RN spoke with admitting physician. Per MD they will discontinue the remaining potassium chloride infusions that are ordered since 3 were already given and patient is on maintenance fluid with 2mEq of potassium in it.

## 2019-04-03 NOTE — Care Management (Signed)
04-03-19 1602 CM received consult for PCP/Medications. PCP established at the [facility]- Pt will need to utilize the [facility] pharmacy and medications will range from $4.00-$10.00. Patient will need all generic medications since she is without insurance. Patient utilized [facility] 02-03-19 and will not be able to utilize [facility] again this year. No further needs from CM at this time. Bethena Roys, RN,BSN Case Manager [phone]

## 2019-04-04 LAB — BASIC METABOLIC PANEL
Anion gap: 10 (ref 5–15)
Anion gap: 11 (ref 5–15)
Anion gap: 11 (ref 5–15)
Anion gap: 18 — ABNORMAL HIGH (ref 5–15)
BUN: 6 mg/dL (ref 6–20)
BUN: 6 mg/dL (ref 6–20)
BUN: 7 mg/dL (ref 6–20)
BUN: 8 mg/dL (ref 6–20)
CO2: 16 mmol/L — ABNORMAL LOW (ref 22–32)
CO2: 20 mmol/L — ABNORMAL LOW (ref 22–32)
CO2: 21 mmol/L — ABNORMAL LOW (ref 22–32)
CO2: 22 mmol/L (ref 22–32)
Calcium: 8.8 mg/dL — ABNORMAL LOW (ref 8.9–10.3)
Calcium: 8.9 mg/dL (ref 8.9–10.3)
Calcium: 8.9 mg/dL (ref 8.9–10.3)
Calcium: 9.1 mg/dL (ref 8.9–10.3)
Chloride: 100 mmol/L (ref 98–111)
Chloride: 100 mmol/L (ref 98–111)
Chloride: 103 mmol/L (ref 98–111)
Chloride: 104 mmol/L (ref 98–111)
Creatinine, Ser: 0.78 mg/dL (ref 0.44–1.00)
Creatinine, Ser: 0.8 mg/dL (ref 0.44–1.00)
Creatinine, Ser: 0.82 mg/dL (ref 0.44–1.00)
Creatinine, Ser: 1.06 mg/dL — ABNORMAL HIGH (ref 0.44–1.00)
GFR calc Af Amer: >60 mL/min (ref >60)
GFR calc Af Amer: >60 mL/min (ref >60)
GFR calc Af Amer: >60 mL/min (ref >60)
GFR calc Af Amer: >60 mL/min (ref >60)
GFR calc non Af Amer: >60 mL/min (ref >60)
GFR calc non Af Amer: >60 mL/min (ref >60)
GFR calc non Af Amer: >60 mL/min (ref >60)
GFR calc non Af Amer: >60 mL/min (ref >60)
Glucose, Bld: 116 mg/dL — ABNORMAL HIGH (ref 70–99)
Glucose, Bld: 170 mg/dL — ABNORMAL HIGH (ref 70–99)
Glucose, Bld: 205 mg/dL — ABNORMAL HIGH (ref 70–99)
Glucose, Bld: 303 mg/dL — ABNORMAL HIGH (ref 70–99)
Potassium: 3.2 mmol/L — ABNORMAL LOW (ref 3.5–5.1)
Potassium: 3.3 mmol/L — ABNORMAL LOW (ref 3.5–5.1)
Potassium: 3.6 mmol/L (ref 3.5–5.1)
Potassium: 3.8 mmol/L (ref 3.5–5.1)
Sodium: 132 mmol/L — ABNORMAL LOW (ref 135–145)
Sodium: 134 mmol/L — ABNORMAL LOW (ref 135–145)
Sodium: 134 mmol/L — ABNORMAL LOW (ref 135–145)
Sodium: 136 mmol/L (ref 135–145)

## 2019-04-04 LAB — BLOOD GAS, ARTERIAL
Acid-base deficit: 1.7 mmol/L (ref 0.0–2.0)
Bicarbonate: 21 mmol/L (ref 20.0–28.0)
Drawn by: 330991
FIO2: 21
O2 Saturation: 99.3 %
Patient temperature: 98.6
pCO2 arterial: 26.3 mmHg — ABNORMAL LOW (ref 32.0–48.0)
pH, Arterial: 7.513 — ABNORMAL HIGH (ref 7.350–7.450)
pO2, Arterial: 121 mmHg — ABNORMAL HIGH (ref 83.0–108.0)

## 2019-04-04 LAB — CBC
HCT: 39.6 % (ref 36.0–46.0)
Hemoglobin: 13.7 g/dL (ref 12.0–15.0)
MCH: 33.3 pg (ref 26.0–34.0)
MCHC: 34.6 g/dL (ref 30.0–36.0)
MCV: 96.4 fL (ref 80.0–100.0)
Platelets: 231 10*3/uL (ref 150–400)
RBC: 4.11 MIL/uL (ref 3.87–5.11)
RDW: 13.8 % (ref 11.5–15.5)
WBC: 7.2 10*3/uL (ref 4.0–10.5)
nRBC: 0 % (ref 0.0–0.2)

## 2019-04-04 LAB — GLUCOSE, CAPILLARY
Glucose-Capillary: 123 mg/dL — ABNORMAL HIGH (ref 70–99)
Glucose-Capillary: 144 mg/dL — ABNORMAL HIGH (ref 70–99)
Glucose-Capillary: 157 mg/dL — ABNORMAL HIGH (ref 70–99)
Glucose-Capillary: 179 mg/dL — ABNORMAL HIGH (ref 70–99)
Glucose-Capillary: 183 mg/dL — ABNORMAL HIGH (ref 70–99)
Glucose-Capillary: 209 mg/dL — ABNORMAL HIGH (ref 70–99)
Glucose-Capillary: 216 mg/dL — ABNORMAL HIGH (ref 70–99)
Glucose-Capillary: 220 mg/dL — ABNORMAL HIGH (ref 70–99)
Glucose-Capillary: 307 mg/dL — ABNORMAL HIGH (ref 70–99)
Glucose-Capillary: 323 mg/dL — ABNORMAL HIGH (ref 70–99)
Glucose-Capillary: 59 mg/dL — ABNORMAL LOW (ref 70–99)
Glucose-Capillary: 90 mg/dL (ref 70–99)

## 2019-04-04 LAB — TSH: TSH: 2.695 u[IU]/mL (ref 0.350–4.500)

## 2019-04-04 LAB — MAGNESIUM: Magnesium: 1.8 mg/dL (ref 1.7–2.4)

## 2019-04-04 MED ORDER — INSULIN ASPART 100 UNIT/ML ~~LOC~~ SOLN: 0.0000 [IU] | Freq: Three times a day (TID) | SUBCUTANEOUS | Status: DC

## 2019-04-04 MED ORDER — INSULIN GLARGINE 100 UNIT/ML ~~LOC~~ SOLN
20.0000 [IU] | SUBCUTANEOUS | Status: DC
  Filled 2019-04-04: qty 0.2

## 2019-04-04 MED ORDER — SODIUM CHLORIDE 0.9 % IV SOLN
INTRAVENOUS | Status: DC
  Administered 2019-04-04: 15:00:00 via INTRAVENOUS

## 2019-04-04 MED ORDER — POTASSIUM CHLORIDE 10 MEQ/100ML IV SOLN
10.0000 meq | INTRAVENOUS | Status: DC
  Administered 2019-04-04 (×2): 10 meq via INTRAVENOUS
  Filled 2019-04-04 (×2): qty 100

## 2019-04-04 MED ORDER — INSULIN ASPART 100 UNIT/ML ~~LOC~~ SOLN
0.0000 [IU] | Freq: Three times a day (TID) | SUBCUTANEOUS | Status: DC
  Administered 2019-04-04: 09:00:00 3 [IU] via SUBCUTANEOUS
  Administered 2019-04-04: 12:00:00 5 [IU] via SUBCUTANEOUS
  Administered 2019-04-04: 11 [IU] via SUBCUTANEOUS
  Administered 2019-04-05: 13:00:00 2 [IU] via SUBCUTANEOUS
  Administered 2019-04-05: 17:00:00 15 [IU] via SUBCUTANEOUS
  Administered 2019-04-05: 09:00:00 2 [IU] via SUBCUTANEOUS
  Administered 2019-04-06: 12:00:00 5 [IU] via SUBCUTANEOUS

## 2019-04-04 MED ORDER — INSULIN GLARGINE 100 UNIT/ML ~~LOC~~ SOLN
15.0000 [IU] | SUBCUTANEOUS | Status: AC
  Administered 2019-04-04: 15 [IU] via SUBCUTANEOUS
  Filled 2019-04-04: qty 0.15

## 2019-04-04 MED ORDER — POTASSIUM CHLORIDE CRYS ER 20 MEQ PO TBCR
40.0000 meq | EXTENDED_RELEASE_TABLET | Freq: Once | ORAL | Status: AC
  Administered 2019-04-04: 07:00:00 40 meq via ORAL
  Filled 2019-04-04: qty 2

## 2019-04-04 MED ORDER — INSULIN ASPART 100 UNIT/ML ~~LOC~~ SOLN
0.0000 [IU] | Freq: Every day | SUBCUTANEOUS | Status: DC
  Administered 2019-04-04: 22:00:00 4 [IU] via SUBCUTANEOUS
  Administered 2019-04-05: 23:00:00 5 [IU] via SUBCUTANEOUS

## 2019-04-04 MED ORDER — POTASSIUM CHLORIDE CRYS ER 20 MEQ PO TBCR
40.0000 meq | EXTENDED_RELEASE_TABLET | ORAL | Status: DC
  Administered 2019-04-04: 04:00:00 40 meq via ORAL
  Filled 2019-04-04 (×2): qty 2

## 2019-04-04 MED ORDER — INSULIN GLARGINE 100 UNIT/ML ~~LOC~~ SOLN
15.0000 [IU] | SUBCUTANEOUS | Status: DC
  Filled 2019-04-04: qty 0.15

## 2019-04-04 MED ORDER — INSULIN ASPART 100 UNIT/ML ~~LOC~~ SOLN
3.0000 [IU] | Freq: Three times a day (TID) | SUBCUTANEOUS | Status: DC
  Administered 2019-04-04 – 2019-04-06 (×5): 3 [IU] via SUBCUTANEOUS

## 2019-04-04 MED ORDER — PROMETHAZINE HCL 25 MG/ML IJ SOLN
25.0000 mg | Freq: Four times a day (QID) | INTRAMUSCULAR | Status: DC | PRN
  Administered 2019-04-04 – 2019-04-06 (×7): 25 mg via INTRAVENOUS
  Filled 2019-04-04 (×8): qty 1

## 2019-04-04 MED ORDER — INSULIN ASPART 100 UNIT/ML ~~LOC~~ SOLN: 0.0000 [IU] | Freq: Every day | SUBCUTANEOUS | Status: DC

## 2019-04-04 MED ORDER — INSULIN GLARGINE 100 UNIT/ML ~~LOC~~ SOLN
20.0000 [IU] | Freq: Every day | SUBCUTANEOUS | Status: DC
  Administered 2019-04-04: 22:00:00 20 [IU] via SUBCUTANEOUS
  Filled 2019-04-04: qty 0.2

## 2019-04-04 NOTE — Discharge Summary (Signed)
Name: Madison Williams MRN: [id number] DOB: Aug 07, 1985 33 y.o. PCP: Patient, No Pcp Per  Date of Admission: 04/02/2019  6:57 PM Date of Discharge: 04/06/2019  Attending Physician: Lalla Brothers T  Discharge Diagnosis:  1. DKA  2. Type 1 DM 3. AKI  Discharge Medications: Allergies as of 04/06/2019   No Known Allergies     Medication List    TAKE these medications   albuterol 108 (90 Base) MCG/ACT inhaler Commonly known as: VENTOLIN HFA Inhale 2 puffs into the lungs every 6 (six) hours as needed for wheezing or shortness of breath.   alum & mag hydroxide-simeth 200-200-20 MG/5ML suspension Commonly known as: MAALOX/MYLANTA Take 15 mLs by mouth every 4 (four) hours as needed for indigestion or heartburn.   famotidine 20 MG tablet Commonly known as: Pepcid Take 1 tablet (20 mg total) by mouth daily.   insulin aspart 100 UNIT/ML injection Commonly known as: novoLOG Inject 0-9 Units into the skin 3 (three) times daily with meals. What changed: additional instructions   insulin glargine 100 UNIT/ML injection Commonly known as: LANTUS Inject 0.25 mLs (25 Units total) into the skin daily. What changed: how much to take   nicotine 14 mg/24hr patch Commonly known as: NICODERM CQ - dosed in mg/24 hours Place 1 patch (14 mg total) onto the skin daily.       Disposition and follow-up:   [name] was discharged from [facility] in Good condition.  At the hospital follow up visit please address:   1. DKA, type 1 DM: Discharged on 25 units lantus and SSI, please assess glucose control. She reported that she sometimes forgets to take her meds, can consider an insulin pump.   AKI: Ct elevated to 1.75 on admission, up from baseline of 0.7, improved to 0.56  2.  Labs / imaging needed at time of follow-up: BMP  3.  Pending labs/ test needing follow-up: None  Follow-up Appointments: Follow-up [facility] Follow up on 04/30/2019.   Why: at 10:30 am for hospital follow appointment with Dr. Joya Gaskins. If you cannot make this scheduled appointment please call the office to reschedule. Pharmacy onsite and medications range from $4.00-$10.00 Contact information: [place] [id number] [facility] Course by problem list:  1. DKA, type 1 DM: This is a 33 year old female with history of type 1 diabetes that is not controlled, nausea, vomiting, malaise, found to be in DKA.  Blood sugar was 241, UA showed ketones found to have a anion gap metabolic acidosis.  No infectious symptoms and initially reported not missing any insulin.  Unclear trigger of her DKA.  Patient treated with DKA protocol and transition to subQ insulin.  Nausea and emesis essentially difficult to control however improved with IV fluids and antiemetics.  Patient discharged with Lantus 25 units daily and continued sliding scale.  Sent in prescriptions for these medications.  Arranged follow-up with [facility].  2.  AKI: Creatinine elevated to 1.75 on admission, from baseline of 0.7.  Likely due to DKA and dehydration.  Treated with IV fluids and creatinine improved down to 0.56 on day of discharge.  Discharge Vitals:   BP 100/72 (BP Location: Left Arm)   Pulse 82   Temp 98 F (36.7 C) (Oral)   Resp 16   Ht 5\' 7"  (1.702 m)   Wt  52.8 kg   SpO2 99%   BMI 18.22 kg/m   Pertinent Labs, Studies, and Procedures:   CBC Latest Ref Rng & Units 04/04/2019 04/03/2019 04/02/2019  WBC 4.0 - 10.5 K/uL 7.2 11.6(H) 14.6(H)  Hemoglobin 12.0 - 15.0 g/dL 24.4 01.0 16.1(H)  Hematocrit 36.0 - 46.0 % 39.6 39.9 52.9(H)  Platelets 150 - 400 K/uL 231 284 303   BMP Latest Ref Rng & Units 04/06/2019 04/05/2019 04/05/2019  Glucose 70 - 99 mg/dL 272(Z) 366(Y) 403(K)  BUN 6 - 20 mg/dL 5(L) 7 6  Creatinine 7.42 - 1.00 mg/dL 5.95 6.38 7.56  Sodium 135 - 145 mmol/L 140 135 137   Potassium 3.5 - 5.1 mmol/L 3.5 4.7 2.9(L)  Chloride 98 - 111 mmol/L 103 104 104  CO2 22 - 32 mmol/L 28 23 24   Calcium 8.9 - 10.3 mg/dL ) 4.3(P) 2.9(J)     Discharge Instructions: Discharge Instructions    Call MD for:  difficulty breathing, headache or visual disturbances   Complete by: As directed    Call MD for:  extreme fatigue   Complete by: As directed    Call MD for:  hives   Complete by: As directed    Call MD for:  persistant dizziness or light-headedness   Complete by: As directed    Call MD for:  persistant nausea and vomiting   Complete by: As directed    Call MD for:  redness, tenderness, or signs of infection (pain, swelling, redness, odor or green/yellow discharge around incision site)   Complete by: As directed    Call MD for:  severe uncontrolled pain   Complete by: As directed    Call MD for:  temperature >100.4   Complete by: As directed    Diet - low sodium heart healthy   Complete by: As directed    Increase activity slowly   Complete by: As directed       Signed: 1.8(A, MD 04/07/2019, 1:44 PM

## 2019-04-04 NOTE — Progress Notes (Addendum)
Inpatient Diabetes Program Recommendations  AACE/ADA: New Consensus Statement on Inpatient Glycemic Control (2015)  Target Ranges:  Prepandial:   less than 140 mg/dL      Peak postprandial:   less than 180 mg/dL (1-2 hours)      Critically ill patients:  140 - 180 mg/dL   Lab Results  Component Value Date   GLUCAP 183 (H) 04/04/2019   HGBA1C 11.1 (H) 04/02/2019    Review of Glycemic Control Results for RITHA, SAMPEDRO (MRN [id number]) as of 04/04/2019 10:39  Ref. Range 04/04/2019 05:16 04/04/2019 06:29 04/04/2019 07:20 04/04/2019 08:31  Glucose-Capillary Latest Ref Range: 70 - 99 mg/dL 216 (H) 123 (H) 157 (H) 183 (H)   Diabetes history: DM type 1 Outpatient Diabetes medications: Lantus 30 units, Novolog 0-9 units tid Current orders for Inpatient glycemic control: Lantus 15 units x 1, Novolog 0-15 units TID, Novolog 0-5 units QHS  Inpatient Diabetes Program Recommendations:    In the event patient remains inpatient: Consider adding Lantus 20 units QD.   Addendum@ 3875: IEPPI with patient regarding outpatient DM management and had previously spoke with patient during last admission on 01/31/2019. Again, denies missing dosages and states, "When I started throwing up, I got into trouble."  Reviewed patient's current A1c of 11.1%. Explained what a A1c is and what it measures. Also reviewed goal A1c with patient, importance of good glucose control @ home, and blood sugar goals. Reviewed in detail the importance of taking medications as prescribed, patho of DM, need for insulin, gastroparesis, vascular changes and co-morbidies.  Patient has a meter and uses it "when able". When asked further, she reports that she works at night and if it is busy, check ing blood sugars are difficult, but when she doses CBGs range from 150-400 mg/dL. She reports that blood sugars are usually in the 300-400's when she misses doses, which she states, "Happens occasionally". Reminded that her glucose control will allow  her to continue working. Reviewed the importance of eating smaller, more frequent meals and when to check blood sugars.  Discussed Freestyle Libre, benefits, how to apply, use and cost. Orders obtained for placement from MD. Samples provided and stressed the importance of following her DM more closely and making it a priority. Patient plans to follow up with [facility] and has no further questions.  Discussed plan of care with MD, Order obtained for Lantus, discussion of Freestyle and meal coverage. MD to place.   Thanks, Bronson Curb, MSN, RNC-OB Diabetes Coordinator [phone] (8a-5p)

## 2019-04-04 NOTE — Progress Notes (Signed)
Subjective: Patient was feeling much improved this morning but not yet back to her baseline. She still reports feeling nauseous and weak but was able to hold down some broth. She is going to try some crackers next. She states she can usually rebound faster than this. She reports continued acid reflux due to vomiting without chest pain otherwise. She wasn't aware she had been set up in a clinic that didn't require insurance and she is interested in pursuing that this time for better diabetes care. She is up to possibly going home today if she can tolerate some more PO.    Objective:  Vital signs in last 24 hours: Vitals:   04/03/19 1539 04/03/19 2139 04/04/19 0512 04/04/19 0516  BP: 130/82 137/87 122/85   Pulse: 64 73 71   Resp: 18     Temp: 98.5 F (36.9 C) 98.4 F (36.9 C) 98.3 F (36.8 C)   TempSrc: Oral Oral Oral   SpO2: 100% 99% 98%   Weight:    51.5 kg  Height:       Physical Exam  Constitutional: She is oriented to person, place, and time.  Thin female laying comfortable in bed  HENT:  Head: Normocephalic.  Cardiovascular: Normal rate, regular rhythm, normal heart sounds and intact distal pulses.  No murmur heard. Pulmonary/Chest: Effort normal and breath sounds normal. No respiratory distress. She exhibits no tenderness.  Abdominal: Soft. Bowel sounds are normal. She exhibits no distension. There is no abdominal tenderness.  Musculoskeletal: Normal range of motion.  Neurological: She is alert and oriented to person, place, and time.  Skin: Skin is warm. No erythema.  Nursing note and vitals reviewed.  Labs:  CMP Latest Ref Rng & Units 04/04/2019 04/04/2019 04/03/2019  Glucose 70 - 99 mg/dL 116(H) 170(H) 205(H)  BUN 6 - 20 mg/dL 7 6 8   Creatinine 0.44 - 1.00 mg/dL 0.78 0.82 1.06(H)  Sodium 135 - 145 mmol/L 136 134(L) 134(L)  Potassium 3.5 - 5.1 mmol/L 3.3(L) 3.2(L) 3.6  Chloride 98 - 111 mmol/L 104 103 100  CO2 22 - 32 mmol/L 22 20(L) 16(L)  Calcium 8.9 - 10.3 mg/dL 9.1  8.9 8.9  Total Protein 6.5 - 8.1 g/dL - - -  Total Bilirubin 0.3 - 1.2 mg/dL - - -  Alkaline Phos 38 - 126 U/L - - -  AST 15 - 41 U/L - - -  ALT 0 - 44 U/L - - -   CBC Latest Ref Rng & Units 04/04/2019 04/03/2019 04/02/2019  WBC 4.0 - 10.5 K/uL 7.2 11.6(H) 14.6(H)  Hemoglobin 12.0 - 15.0 g/dL 13.7 12.8 16.1(H)  Hematocrit 36.0 - 46.0 % 39.6 39.9 52.9(H)  Platelets 150 - 400 K/uL 231 284 303    Assessment/Plan:  Assessment: Ms. Lear is a 33 yo F with a PMHx of T1D and asthma who presented with a 24 hr hx of N/V/mailaise with elevated blood glucose to 241, a UA positive for ketones and an anion gap metabolic acidosis suggestive of DKA with closure of her anion gap and transition to phase 2 of DKA protocol and diabetic diet.   Plan: Principal Problem:   DKA (diabetic ketoacidoses) (HCC)/Type 1 diabetes mellitus with hyperglycemia (HCC) -pt w/ hx T1D w/ 24 hr hx N/V/mailaise w/ elevated glucose, ketonuria and an anion gap metabolic acidosis concerning for DKA; multiple prior admissions for DKA from increasingly uncontrolled diabetes (Hgb A1c of 11.1 this admission and 10.4 2 mo ago) -gap closure overnight with transition off insulin drip  to phase 2 dka protocol w/ diabetic diet -K low at 3.3 today, repleted -need good fu and primary care so plan to discuss today  Active Problems:   Acute kidney injury (resolved) -Creatinine on admission of 1.75 which is increased from baseline of 0.70 likely prerenal secondary to dehydration from N/V/DKA -responded well to IVF with decrease to 0.78 today    Leukocytosis (resolved)  -WBC of 20.7 on admission which has continued to downtrend along w/ Hgb and platelets suggestive of a leukoconcentration rather than a true leukocytosis, 7.2 today  Dispo: Anticipated discharge today.  Jenell Milliner, MD 04/04/2019, 8:01 AM Pager: [phone]

## 2019-04-04 NOTE — Progress Notes (Signed)
  Date: 04/04/2019  Patient name: Madison Williams  Medical record number: [id number]  Date of birth: Mar 05, 1986        I have seen and evaluated this patient and I have discussed the plan of care with the house staff. Please see Dr. Webb Silversmith note for complete details. I concur with her findings and plan.  If patient still nauseous or unable to tolerate solid food, would monitor for 1 more day and discharge over the weekend.    Sid Falcon, MD 04/04/2019, 6:53 PM

## 2019-04-05 DIAGNOSIS — E860 Dehydration: Secondary | ICD-10-CM

## 2019-04-05 LAB — BASIC METABOLIC PANEL
Anion gap: 9 (ref 5–15)
Anion gap: 8 (ref 5–15)
BUN: 6 mg/dL (ref 6–20)
BUN: 7 mg/dL (ref 6–20)
CO2: 24 mmol/L (ref 22–32)
CO2: 23 mmol/L (ref 22–32)
Calcium: 8.7 mg/dL — ABNORMAL LOW (ref 8.9–10.3)
Calcium: 8.5 mg/dL — ABNORMAL LOW (ref 8.9–10.3)
Chloride: 104 mmol/L (ref 98–111)
Chloride: 104 mmol/L (ref 98–111)
Creatinine, Ser: 0.53 mg/dL (ref 0.44–1.00)
Creatinine, Ser: 0.67 mg/dL (ref 0.44–1.00)
GFR calc Af Amer: >60 mL/min (ref >60)
GFR calc Af Amer: >60 mL/min (ref >60)
GFR calc non Af Amer: >60 mL/min (ref >60)
GFR calc non Af Amer: >60 mL/min (ref >60)
Glucose, Bld: 124 mg/dL — ABNORMAL HIGH (ref 70–99)
Glucose, Bld: 353 mg/dL — ABNORMAL HIGH (ref 70–99)
Potassium: 2.9 mmol/L — ABNORMAL LOW (ref 3.5–5.1)
Potassium: 4.7 mmol/L (ref 3.5–5.1)
Sodium: 137 mmol/L (ref 135–145)
Sodium: 135 mmol/L (ref 135–145)

## 2019-04-05 LAB — GLUCOSE, CAPILLARY
Glucose-Capillary: 145 mg/dL — ABNORMAL HIGH (ref 70–99)
Glucose-Capillary: 122 mg/dL — ABNORMAL HIGH (ref 70–99)
Glucose-Capillary: 358 mg/dL — ABNORMAL HIGH (ref 70–99)
Glucose-Capillary: 109 mg/dL — ABNORMAL HIGH (ref 70–99)
Glucose-Capillary: 359 mg/dL — ABNORMAL HIGH (ref 70–99)

## 2019-04-05 MED ORDER — POTASSIUM CHLORIDE CRYS ER 20 MEQ PO TBCR
40.0000 meq | EXTENDED_RELEASE_TABLET | Freq: Two times a day (BID) | ORAL | Status: AC
  Administered 2019-04-05 (×2): 40 meq via ORAL
  Filled 2019-04-05 (×2): qty 2

## 2019-04-05 MED ORDER — POTASSIUM CHLORIDE CRYS ER 20 MEQ PO TBCR
40.0000 meq | EXTENDED_RELEASE_TABLET | Freq: Three times a day (TID) | ORAL | Status: DC
  Administered 2019-04-05: 40 meq via ORAL

## 2019-04-05 MED ORDER — INSULIN GLARGINE 100 UNIT/ML ~~LOC~~ SOLN
25.0000 [IU] | Freq: Every day | SUBCUTANEOUS | Status: DC
  Administered 2019-04-05: 23:00:00 25 [IU] via SUBCUTANEOUS
  Filled 2019-04-05 (×2): qty 0.25

## 2019-04-05 MED ORDER — PANTOPRAZOLE SODIUM 40 MG PO TBEC
40.0000 mg | DELAYED_RELEASE_TABLET | Freq: Two times a day (BID) | ORAL | Status: DC
  Administered 2019-04-05 – 2019-04-06 (×3): 40 mg via ORAL
  Filled 2019-04-05 (×2): qty 1

## 2019-04-05 MED ORDER — POTASSIUM CHLORIDE 10 MEQ/100ML IV SOLN
10.0000 meq | INTRAVENOUS | Status: AC
  Administered 2019-04-05 (×3): 10 meq via INTRAVENOUS
  Filled 2019-04-05: qty 100

## 2019-04-05 NOTE — Progress Notes (Signed)
   Subjective:  Patient reports that she continues to experience nausea, denies any emesis, stomach pain. Reports feeling much better than when she got here. Had a bowel movement yesterday. Would feel good to go today if she continues to feel better. She would like to follow up with community [facility] wellness.   Objective:  Vital signs in last 24 hours: Vitals:   04/04/19 0516 04/04/19 2138 04/05/19 0011 04/05/19 0605  BP:  105/74 115/85 120/89  Pulse:  (!) 108 79 70  Resp:  18 17 17   Temp:  98.4 F (36.9 C) 97.6 F (36.4 C) (!) 97.5 F (36.4 C)  TempSrc:  Oral Oral Oral  SpO2:  100% 100% 100%  Weight: 51.5 kg   52.8 kg  Height:        General: Lying in bed, in no acute distress Cardiac: Regular rate and rhythm. No M/R/G Pulmonary: CTAB , no wheezes, Abdomen: Mildly tender in umbilical region, normal bowel sounds Extremity: No LE edema   Assessment/Plan:  Principal Problem:   DKA (diabetic ketoacidoses) (HCC) Active Problems:   Acute kidney injury ([place])   Leukocytosis   Type 1 diabetes mellitus with hyperglycemia ([place])  This is a 33 year old female with history of type 1 diabetes, and asthma presented nausea, vomiting, malaise, noted to be in DKA.  DKA:  Patient underwent DKA protocol and transitioned to SubQ insulin, currently on lantus 20 units daily, novolog 3 units TID WC, and SSI. Glucose has been elevated around 300 however most recent reading was 124. Continue to monitor throughout the day.  Patient reported that she was unable to obtain her medications due to not having a primary care physician to follow-up with.  She did not appear to have any infectious process and her beta-hCG was negative admission.  This may be due to running out of her medication.  -Continue Lantus 20 units daily -Continue NovoLog 3 units 3 times daily WC -Continue SSI -Continue antiemetics -If patient is stable later today and able to tolerate p.o. intake may be stable for discharge  -Patient has follow-up with [facility] later this month  Hypokalemia: K down to 2.9 today, repleting with IV and PO potassium.  -Repeat BMP in PM  AKI: Cr elevated to 1.75 admission, up from baseline of 0.7.  Treated with IV fluids and creatinine today 0.73.  FEN: No fluids, replete lytes prn, CM diet  VTE ppx: Lovenox  Code Status: FULL   Dispo: Anticipated discharge in approximately 0-1 day.   Asencion Noble, MD 04/05/2019, 7:13 AM Pager: [phone]

## 2019-04-05 NOTE — Progress Notes (Signed)
Pt cbg  Had dropped to 57 on her FreeStyle Libre machine. Rechecked CBG on our machine at 109.Pt got lightheaded  During the ep[isode. Dr. Court Joy updated.

## 2019-04-06 LAB — BASIC METABOLIC PANEL
Anion gap: 9 (ref 5–15)
BUN: 5 mg/dL — ABNORMAL LOW (ref 6–20)
CO2: 28 mmol/L (ref 22–32)
Calcium: 8.6 mg/dL — ABNORMAL LOW (ref 8.9–10.3)
Chloride: 103 mmol/L (ref 98–111)
Creatinine, Ser: 0.56 mg/dL (ref 0.44–1.00)
GFR calc Af Amer: >60 mL/min (ref >60)
GFR calc non Af Amer: >60 mL/min (ref >60)
Glucose, Bld: 145 mg/dL — ABNORMAL HIGH (ref 70–99)
Potassium: 3.5 mmol/L (ref 3.5–5.1)
Sodium: 140 mmol/L (ref 135–145)

## 2019-04-06 LAB — GLUCOSE, CAPILLARY
Glucose-Capillary: 88 mg/dL (ref 70–99)
Glucose-Capillary: 238 mg/dL — ABNORMAL HIGH (ref 70–99)

## 2019-04-06 MED ORDER — INSULIN ASPART 100 UNIT/ML ~~LOC~~ SOLN: 0.0000 [IU] | Freq: Three times a day (TID) | SUBCUTANEOUS | 0 refills | Status: DC

## 2019-04-06 MED ORDER — PROMETHAZINE HCL 25 MG PO TABS: 25.0000 mg | ORAL_TABLET | Freq: Four times a day (QID) | ORAL | Status: DC | PRN

## 2019-04-06 MED ORDER — INSULIN GLARGINE 100 UNIT/ML ~~LOC~~ SOLN: 25.0000 [IU] | Freq: Every day | SUBCUTANEOUS | 11 refills | Status: DC

## 2019-04-06 NOTE — Progress Notes (Signed)
Looking back at patient's record, she has had some low B/P's and she was sleeping soundly before we came in to take her VS and reassess her. Patient denies pain or any discomfort at this time and no S/S of hypoglycemia, will continue to monitor.

## 2019-04-06 NOTE — Progress Notes (Signed)
   Subjective: Ms. Scorza reports that she is doing better this morning. She reports that she is still having some nausea but that she now has an appetite and is overall feeling closer to herself. She reported that she does take long acting insulin and a sliding scale insulin at home.   Objective:  Vital signs in last 24 hours: Vitals:   04/05/19 1612 04/05/19 2015 04/06/19 0014 04/06/19 0451  BP: 114/76 112/81 112/76 100/72  Pulse: 96 96 91 82  Resp: 17 18 16 13   Temp: 98.8 F (37.1 C) 98.4 F (36.9 C) 98.5 F (36.9 C) 98 F (36.7 C)  TempSrc: Oral Oral Oral Oral  SpO2: 100% 100% 100% 99%  Weight:      Height:        General: Well appearing, young female, NAD Cardiac: RRR, no m/r/g Pulmonary: CTABL, normal work of breathing Abdomen: Soft, non-tender, non-distended, normoactive bowel sounds Extremity: No LE edema   Assessment/Plan:  Principal Problem:   DKA (diabetic ketoacidoses) (HCC) Active Problems:   Acute kidney injury (HCC)   Leukocytosis   Type 1 diabetes mellitus with hyperglycemia (HCC)  This is a 33 year old female with history of type 1 diabetes, and asthma presented nausea, vomiting, malaise, noted to be in DKA.  DKA:  Patient underwent DKA protocol and transitioned to SubQ insulin, currently on lantus 25 units daily, novolog 3 units TID WC, and SSI. Glucose still elevated around mealtimes, morning CBGs seem to be better controlled, appears that her mealtime insulin may be too low. Patient is on sliding scale at home and is aware of how to use it, she will likely be able to control her CBGs at home. Unclear what the trigger for her DKA was however patient did report not having a PCP and sometimes forgetting to take her insulin which may be the cause. Patient now has appointment scheduled with new PCP at the end of the month.   -Continue Lantus 25 units on discharge -Advised patient to continue SS at home -Will send in prescription for her insulin to  pharmacy, unfortunately the TOC is closed today -Patient has follow-up with [facility] later this month  Hypokalemia: K stable today,   AKI: Cr elevated to 1.75 admission, up from baseline of 0.7.  Treated with IV fluids and creatinine today 0.56.  FEN: No fluids, replete lytes prn, CM diet  VTE ppx: Lovenox  Code Status: FULL   Dispo: Anticipated discharge in approximately today.   Asencion Noble, MD 04/06/2019, 6:48 AM Pager: [phone]

## 2019-04-06 NOTE — Discharge Instructions (Signed)
Madison Williams,   It has been a pleasure working with you and we are glad you're feeling better. You were hospitalized for elevated blood sugar levels.  You were treated with IV insulin and IV fluids and your blood sugars have been better controlled.   Please continue taking the insulin as follows, Start taking Lantus 25 units daily Continue taking the NovoLog sliding scale  Please follow up with your primary care provider, you have a scheduled appointment   If your symptoms worsen or you develop new symptoms, please seek medical help whether it is your primary care provider or emergency department.  If you have any questions about this hospitalization please call [phone].

## 2019-04-08 LAB — CULTURE, BLOOD (ROUTINE X 2)
Culture: NO GROWTH
Culture: NO GROWTH

## 2019-04-30 ENCOUNTER — Inpatient Hospital Stay: Payer: Self-pay | Admitting: Critical Care Medicine

## 2019-10-16 ENCOUNTER — Other Ambulatory Visit: Payer: Self-pay

## 2019-10-16 ENCOUNTER — Encounter (HOSPITAL_COMMUNITY): Payer: Self-pay

## 2019-10-16 ENCOUNTER — Emergency Department (HOSPITAL_COMMUNITY): Payer: Self-pay

## 2019-10-16 ENCOUNTER — Inpatient Hospital Stay (HOSPITAL_COMMUNITY)
Admission: EM | Admit: 2019-10-16 | Discharge: 2019-10-19 | DRG: 638 | Disposition: A | Discharge status: Home / Self Care | Payer: Self-pay | Attending: Internal Medicine | Admitting: Internal Medicine

## 2019-10-16 DIAGNOSIS — R112 Nausea with vomiting, unspecified: Secondary | ICD-10-CM | POA: Diagnosis present

## 2019-10-16 DIAGNOSIS — M069 Rheumatoid arthritis, unspecified: Secondary | ICD-10-CM | POA: Diagnosis present

## 2019-10-16 DIAGNOSIS — R109 Unspecified abdominal pain: Secondary | ICD-10-CM | POA: Diagnosis present

## 2019-10-16 DIAGNOSIS — Z79899 Other long term (current) drug therapy: Secondary | ICD-10-CM

## 2019-10-16 DIAGNOSIS — E1010 Type 1 diabetes mellitus with ketoacidosis without coma: Principal | ICD-10-CM | POA: Diagnosis present

## 2019-10-16 DIAGNOSIS — E1110 Type 2 diabetes mellitus with ketoacidosis without coma: Secondary | ICD-10-CM | POA: Diagnosis present

## 2019-10-16 DIAGNOSIS — F17210 Nicotine dependence, cigarettes, uncomplicated: Secondary | ICD-10-CM | POA: Diagnosis present

## 2019-10-16 DIAGNOSIS — E860 Dehydration: Secondary | ICD-10-CM | POA: Diagnosis present

## 2019-10-16 DIAGNOSIS — Z20822 Contact with and (suspected) exposure to covid-19: Secondary | ICD-10-CM | POA: Diagnosis present

## 2019-10-16 DIAGNOSIS — Z794 Long term (current) use of insulin: Secondary | ICD-10-CM

## 2019-10-16 DIAGNOSIS — E1065 Type 1 diabetes mellitus with hyperglycemia: Secondary | ICD-10-CM | POA: Diagnosis present

## 2019-10-16 DIAGNOSIS — N179 Acute kidney failure, unspecified: Secondary | ICD-10-CM | POA: Diagnosis present

## 2019-10-16 DIAGNOSIS — F17200 Nicotine dependence, unspecified, uncomplicated: Secondary | ICD-10-CM | POA: Diagnosis present

## 2019-10-16 DIAGNOSIS — E1310 Other specified diabetes mellitus with ketoacidosis without coma: Secondary | ICD-10-CM

## 2019-10-16 DIAGNOSIS — D72829 Elevated white blood cell count, unspecified: Secondary | ICD-10-CM | POA: Diagnosis present

## 2019-10-16 DIAGNOSIS — J4520 Mild intermittent asthma, uncomplicated: Secondary | ICD-10-CM | POA: Diagnosis present

## 2019-10-16 DIAGNOSIS — R1310 Dysphagia, unspecified: Secondary | ICD-10-CM | POA: Diagnosis present

## 2019-10-16 LAB — COMPREHENSIVE METABOLIC PANEL
ALT: 28 U/L (ref 0–44)
AST: 19 U/L (ref 15–41)
Albumin: 4.9 g/dL (ref 3.5–5.0)
Alkaline Phosphatase: 94 U/L (ref 38–126)
Anion gap: 31 — ABNORMAL HIGH (ref 5–15)
BUN: 22 mg/dL — ABNORMAL HIGH (ref 6–20)
CO2: <7 mmol/L — ABNORMAL LOW (ref 22–32)
Calcium: 8.8 mg/dL — ABNORMAL LOW (ref 8.9–10.3)
Chloride: 96 mmol/L — ABNORMAL LOW (ref 98–111)
Creatinine, Ser: 1.24 mg/dL — ABNORMAL HIGH (ref 0.44–1.00)
GFR calc Af Amer: >60 mL/min (ref >60)
GFR calc non Af Amer: 57 mL/min — ABNORMAL LOW (ref >60)
Glucose, Bld: 490 mg/dL — ABNORMAL HIGH (ref 70–99)
Potassium: 5 mmol/L (ref 3.5–5.1)
Sodium: 133 mmol/L — ABNORMAL LOW (ref 135–145)
Total Bilirubin: 2.6 mg/dL — ABNORMAL HIGH (ref 0.3–1.2)
Total Protein: 8.6 g/dL — ABNORMAL HIGH (ref 6.5–8.1)

## 2019-10-16 LAB — URINALYSIS, ROUTINE W REFLEX MICROSCOPIC
Bilirubin Urine: NEGATIVE
Glucose, UA: >=500 mg/dL — AB
Ketones, ur: 80 mg/dL — AB
Leukocytes,Ua: NEGATIVE
Nitrite: NEGATIVE
Protein, ur: 100 mg/dL — AB
Specific Gravity, Urine: 1.023 (ref 1.005–1.030)
pH: 5 (ref 5.0–8.0)

## 2019-10-16 LAB — CBC WITH DIFFERENTIAL/PLATELET
Abs Immature Granulocytes: 0.05 10*3/uL (ref 0.00–0.07)
Basophils Absolute: 0 10*3/uL (ref 0.0–0.1)
Basophils Relative: 0 %
Eosinophils Absolute: 0 10*3/uL (ref 0.0–0.5)
Eosinophils Relative: 0 %
HCT: 48.8 % — ABNORMAL HIGH (ref 36.0–46.0)
Hemoglobin: 16 g/dL — ABNORMAL HIGH (ref 12.0–15.0)
Immature Granulocytes: 0 %
Lymphocytes Relative: 8 %
Lymphs Abs: 0.9 10*3/uL (ref 0.7–4.0)
MCH: 32.7 pg (ref 26.0–34.0)
MCHC: 32.8 g/dL (ref 30.0–36.0)
MCV: 99.6 fL (ref 80.0–100.0)
Monocytes Absolute: 0.8 10*3/uL (ref 0.1–1.0)
Monocytes Relative: 7 %
Neutro Abs: 9.5 10*3/uL — ABNORMAL HIGH (ref 1.7–7.7)
Neutrophils Relative %: 85 %
Platelets: 295 10*3/uL (ref 150–400)
RBC: 4.9 MIL/uL (ref 3.87–5.11)
RDW: 13.4 % (ref 11.5–15.5)
WBC: 11.2 10*3/uL — ABNORMAL HIGH (ref 4.0–10.5)
nRBC: 0 % (ref 0.0–0.2)

## 2019-10-16 LAB — CBG MONITORING, ED
Glucose-Capillary: 214 mg/dL — ABNORMAL HIGH (ref 70–99)
Glucose-Capillary: 225 mg/dL — ABNORMAL HIGH (ref 70–99)
Glucose-Capillary: 320 mg/dL — ABNORMAL HIGH (ref 70–99)
Glucose-Capillary: 415 mg/dL — ABNORMAL HIGH (ref 70–99)
Glucose-Capillary: 490 mg/dL — ABNORMAL HIGH (ref 70–99)

## 2019-10-16 LAB — BLOOD GAS, VENOUS
O2 Saturation: 77.5 %
Patient temperature: 98.6
pCO2, Ven: <19 mmHg — CL (ref 44.0–60.0)
pH, Ven: 7.145 — CL (ref 7.250–7.430)
pO2, Ven: 57.6 mmHg — ABNORMAL HIGH (ref 32.0–45.0)

## 2019-10-16 LAB — BETA-HYDROXYBUTYRIC ACID: Beta-Hydroxybutyric Acid: >8 mmol/L — ABNORMAL HIGH (ref 0.05–0.27)

## 2019-10-16 LAB — MAGNESIUM: Magnesium: 2.4 mg/dL (ref 1.7–2.4)

## 2019-10-16 LAB — I-STAT BETA HCG BLOOD, ED (MC, WL, AP ONLY): I-stat hCG, quantitative: <5 m[IU]/mL (ref <5)

## 2019-10-16 LAB — LIPASE, BLOOD: Lipase: 16 U/L (ref 11–51)

## 2019-10-16 MED ORDER — SODIUM CHLORIDE 0.45 % IV SOLN
INTRAVENOUS | Status: DC
  Filled 2019-10-16: qty 1000

## 2019-10-16 MED ORDER — FENTANYL CITRATE (PF) 100 MCG/2ML IJ SOLN
12.5000 ug | INTRAMUSCULAR | Status: DC | PRN
  Administered 2019-10-17 – 2019-10-18 (×6): 12.5 ug via INTRAVENOUS
  Filled 2019-10-16 (×6): qty 2

## 2019-10-16 MED ORDER — DEXTROSE-NACL 5-0.45 % IV SOLN
INTRAVENOUS | Status: DC
  Administered 2019-10-16: 21:00:00 75 mL/h via INTRAVENOUS

## 2019-10-16 MED ORDER — SODIUM CHLORIDE 0.9% FLUSH
3.0000 mL | Freq: Once | INTRAVENOUS | Status: AC
  Administered 2019-10-16: 20:00:00 3 mL via INTRAVENOUS

## 2019-10-16 MED ORDER — LORAZEPAM 2 MG/ML IJ SOLN
0.5000 mg | INTRAMUSCULAR | Status: DC | PRN
  Administered 2019-10-18: 0.5 mg via INTRAVENOUS
  Filled 2019-10-16: qty 1

## 2019-10-16 MED ORDER — SODIUM CHLORIDE 0.9% FLUSH
3.0000 mL | Freq: Two times a day (BID) | INTRAVENOUS | Status: DC
  Administered 2019-10-16 – 2019-10-18 (×5): 3 mL via INTRAVENOUS

## 2019-10-16 MED ORDER — SODIUM CHLORIDE 0.9 % IV BOLUS
1000.0000 mL | INTRAVENOUS | Status: AC
  Administered 2019-10-16 (×2): 1000 mL via INTRAVENOUS

## 2019-10-16 MED ORDER — NICOTINE 14 MG/24HR TD PT24: 14.0000 mg | MEDICATED_PATCH | Freq: Every day | TRANSDERMAL | Status: DC | PRN

## 2019-10-16 MED ORDER — ONDANSETRON HCL 4 MG/2ML IJ SOLN: 4.0000 mg | Freq: Once | INTRAMUSCULAR | Status: DC

## 2019-10-16 MED ORDER — DIPHENHYDRAMINE HCL 50 MG/ML IJ SOLN
25.0000 mg | Freq: Once | INTRAMUSCULAR | Status: AC
  Administered 2019-10-16: 25 mg via INTRAVENOUS
  Filled 2019-10-16: qty 1

## 2019-10-16 MED ORDER — KCL IN DEXTROSE-NACL 10-5-0.45 MEQ/L-%-% IV SOLN
INTRAVENOUS | Status: DC
  Filled 2019-10-16 (×3): qty 1000

## 2019-10-16 MED ORDER — ALBUTEROL SULFATE (2.5 MG/3ML) 0.083% IN NEBU: 2.5000 mg | INHALATION_SOLUTION | Freq: Four times a day (QID) | RESPIRATORY_TRACT | Status: DC | PRN

## 2019-10-16 MED ORDER — DEXTROSE 50 % IV SOLN: 0.0000 mL | INTRAVENOUS | Status: DC | PRN

## 2019-10-16 MED ORDER — INSULIN REGULAR(HUMAN) IN NACL 100-0.9 UT/100ML-% IV SOLN
INTRAVENOUS | Status: DC
  Administered 2019-10-16: 11 [IU]/h via INTRAVENOUS
  Administered 2019-10-17: 3.2 [IU]/h via INTRAVENOUS
  Filled 2019-10-16 (×2): qty 100

## 2019-10-16 MED ORDER — ENOXAPARIN SODIUM 40 MG/0.4ML ~~LOC~~ SOLN
40.0000 mg | SUBCUTANEOUS | Status: DC
  Administered 2019-10-17 – 2019-10-19 (×3): 40 mg via SUBCUTANEOUS
  Filled 2019-10-16 (×3): qty 0.4

## 2019-10-16 MED ORDER — PROCHLORPERAZINE EDISYLATE 10 MG/2ML IJ SOLN
10.0000 mg | Freq: Once | INTRAMUSCULAR | Status: AC
  Administered 2019-10-16: 10 mg via INTRAVENOUS
  Filled 2019-10-16: qty 2

## 2019-10-16 MED ORDER — SODIUM CHLORIDE 0.9 % IV SOLN: INTRAVENOUS | Status: DC

## 2019-10-16 MED ORDER — PROMETHAZINE HCL 25 MG/ML IJ SOLN
12.5000 mg | Freq: Once | INTRAMUSCULAR | Status: AC
  Administered 2019-10-16: 12.5 mg via INTRAVENOUS
  Filled 2019-10-16: qty 1

## 2019-10-16 NOTE — ED Notes (Signed)
ED Provider at bedside. 

## 2019-10-16 NOTE — ED Triage Notes (Signed)
Patient arrived via [facility] from home.   C/O N/V and abdominal pain X2 days  4mg  zofran and 500cc NS given per ems.   Pt reports N/V episodes 3-4X a year per patient.   Hx. DM  CBG-293 P-74  A/Ox4 Ambulatory with ems  20g Left AC.

## 2019-10-16 NOTE — ED Notes (Signed)
cbg 415

## 2019-10-16 NOTE — ED Provider Notes (Signed)
[facility]-EMERGENCY DEPT Provider Note   CSN: [id number] Arrival date & time: 10/16/19  1831     History Chief Complaint  Patient presents with  . Abdominal Pain  . Emesis    Rikia Alianah Lofton is a 34 y.o. female.  The history is provided by the patient.  Emesis Severity:  Moderate Duration:  2 days Timing:  Constant Progression:  Unchanged Chronicity:  New Recent urination:  Normal Relieved by:  Nothing Worsened by:  Nothing Associated symptoms: abdominal pain   Associated symptoms: no arthralgias, no chills, no cough, no diarrhea, no fever, no headaches, no myalgias, no sore throat and no URI   Risk factors: diabetes (hx of DKA, issues with compliance)   Risk factors: no suspect food intake        Past Medical History:  Diagnosis Date  . Asthma   . Diabetes mellitus without complication (HCC)   . Rheumatoid arthritis [facility])     Patient Active Problem List   Diagnosis Date Noted  . Chest tightness   . Mild intermittent asthma without complication   . Acute vomiting 01/31/2019  . Type 1 diabetes mellitus with hyperglycemia (HCC) 01/31/2019  . Uninsured 01/31/2019  . DKA (diabetic ketoacidoses) (HCC) 03/30/2018  . Acute kidney injury (HCC) 03/30/2018  . Leukocytosis 03/30/2018  . Dehydration 03/30/2018  . Tobacco use disorder 03/30/2018    Past Surgical History:  Procedure Laterality Date  . LAPAROSCOPIC OVARIAN       OB History   No obstetric history on file.     History reviewed. No pertinent family history.  Social History   Tobacco Use  . Smoking status: Current Every Day Smoker    Packs/day: 1.00  . Smokeless tobacco: Never Used  Substance Use Topics  . Alcohol use: Yes    Comment: rarely  . Drug use: Never    Home Medications Prior to Admission medications   Medication Sig Start Date End Date Taking? Authorizing Provider  albuterol (PROVENTIL HFA;VENTOLIN HFA) 108 (90 Base) MCG/ACT inhaler Inhale 2 puffs  into the lungs every 6 (six) hours as needed for wheezing or shortness of breath.   Yes [provider]  insulin aspart (NOVOLOG) 100 UNIT/ML injection Inject 0-9 Units into the skin 3 (three) times daily with meals. Patient taking differently: Inject 0-11 Units into the skin 3 (three) times daily with meals.  04/06/19  Yes Claudean Severance, MD  insulin aspart protamine- aspart (NOVOLOG MIX 70/30) (70-30) 100 UNIT/ML injection Inject 11-20 Units into the skin in the morning, at noon, and at bedtime.   Yes [provider]  alum & mag hydroxide-simeth (MAALOX/MYLANTA) 200-200-20 MG/5ML suspension Take 15 mLs by mouth every 4 (four) hours as needed for indigestion or heartburn. Patient not taking: Reported on 04/02/2019 04/01/18   Rolly Salter, MD  famotidine (PEPCID) 20 MG tablet Take 1 tablet (20 mg total) by mouth daily. Patient not taking: Reported on 04/02/2019 04/01/18 04/01/28  Rolly Salter, MD  insulin glargine (LANTUS) 100 UNIT/ML injection Inject 0.25 mLs (25 Units total) into the skin daily. Patient not taking: Reported on 10/16/2019 04/06/19   Claudean Severance, MD  nicotine (NICODERM CQ - DOSED IN MG/24 HOURS) 14 mg/24hr patch Place 1 patch (14 mg total) onto the skin daily. Patient not taking: Reported on 01/30/2019 04/01/18   Rolly Salter, MD    Allergies    Patient has no known allergies.  Review of Systems   Review of Systems  Constitutional: Negative for chills and fever.  HENT: Negative for ear pain and sore throat.   Eyes: Negative for pain and visual disturbance.  Respiratory: Positive for shortness of breath. Negative for cough.   Cardiovascular: Negative for chest pain and palpitations.  Gastrointestinal: Positive for abdominal pain, nausea and vomiting. Negative for abdominal distention, anal bleeding, blood in stool, constipation and diarrhea.  Genitourinary: Negative for dysuria and hematuria.  Musculoskeletal: Negative for arthralgias, back pain and  myalgias.  Skin: Negative for color change and rash.  Neurological: Negative for seizures, syncope and headaches.  All other systems reviewed and are negative.   Physical Exam Updated Vital Signs  ED Triage Vitals  Enc Vitals Group     BP 10/16/19 1845 (!) 146/88     Pulse Rate 10/16/19 1845 83     Resp 10/16/19 1845 (!) 23     Temp 10/16/19 1845 (!) 97.3 F (36.3 C)     Temp Source 10/16/19 1845 Oral     SpO2 10/16/19 1845 100 %     Weight 10/16/19 1841 120 lb (54.4 kg)     Height 10/16/19 1841 5\' 7"  (1.702 m)     Head Circumference --      Peak Flow --      Pain Score 10/16/19 1841 6     Pain Loc --      Pain Edu? --      Excl. in [place]? --     Physical Exam Vitals and nursing note reviewed.  Constitutional:      General: She is not in acute distress.    Appearance: She is well-developed. She is ill-appearing.  HENT:     Head: Normocephalic and atraumatic.     Comments: Dry mucous membranes  Eyes:     Extraocular Movements: Extraocular movements intact.     Conjunctiva/sclera: Conjunctivae normal.  Cardiovascular:     Rate and Rhythm: Normal rate and regular rhythm.     Heart sounds: Normal heart sounds. No murmur.  Pulmonary:     Effort: No respiratory distress.     Breath sounds: Normal breath sounds.     Comments: Increased work of breathing Abdominal:     General: Abdomen is flat. There is no distension.     Palpations: Abdomen is soft.     Tenderness: There is generalized abdominal tenderness. There is no right CVA tenderness, left CVA tenderness, guarding or rebound. Negative signs include Murphy's sign and Rovsing's sign.  Genitourinary:    Rectum: Normal.  Musculoskeletal:     Cervical back: Neck supple.  Skin:    General: Skin is warm and dry.  Neurological:     General: No focal deficit present.     Mental Status: She is alert.  Psychiatric:        Mood and Affect: Mood normal.     ED Results / Procedures / Treatments   Labs (all labs  ordered are listed, but only abnormal results are displayed) Labs Reviewed  COMPREHENSIVE METABOLIC PANEL - Abnormal; Notable for the following components:      Result Value   Sodium 133 (*)    Chloride 96 (*)    CO2 <7 (*)    Glucose, Bld 490 (*)    BUN 22 (*)    Creatinine, Ser 1.24 (*)    Calcium 8.8 (*)    Total Protein 8.6 (*)    Total Bilirubin 2.6 (*)    GFR calc non Af Amer 57 (*)  Anion gap 31 (*)    All other components within normal limits  CBC WITH DIFFERENTIAL/PLATELET - Abnormal; Notable for the following components:   WBC 11.2 (*)    Hemoglobin 16.0 (*)    HCT 48.8 (*)    Neutro Abs 9.5 (*)    All other components within normal limits  BLOOD GAS, VENOUS - Abnormal; Notable for the following components:   pH, Ven 7.145 (*)    pCO2, Ven <19.0 (*)    pO2, Ven 57.6 (*)    All other components within normal limits  BETA-HYDROXYBUTYRIC ACID - Abnormal; Notable for the following components:   Beta-Hydroxybutyric Acid >8.00 (*)    All other components within normal limits  URINALYSIS, ROUTINE W REFLEX MICROSCOPIC - Abnormal; Notable for the following components:   Color, Urine STRAW (*)    Glucose, UA >=500 (*)    Hgb urine dipstick SMALL (*)    Ketones, ur 80 (*)    Protein, ur 100 (*)    Bacteria, UA RARE (*)    All other components within normal limits  CBG MONITORING, ED - Abnormal; Notable for the following components:   Glucose-Capillary 490 (*)    All other components within normal limits  CBG MONITORING, ED - Abnormal; Notable for the following components:   Glucose-Capillary 415 (*)    All other components within normal limits  URINE CULTURE  SARS CORONAVIRUS 2 (TAT 6-24 HRS)  LIPASE, BLOOD  I-STAT BETA HCG BLOOD, ED ([facility], [facility], [facility] ONLY)  CBG MONITORING, ED    EKG EKG Interpretation  Date/Time:  Thursday October 16 2019 20:06:51 EDT Ventricular Rate:  97 PR Interval:    QRS Duration: 91 QT Interval:  388 QTC Calculation: 493 R Axis:   72 Text  Interpretation: Sinus rhythm Short PR interval Consider right atrial enlargement No significant change since last tracing Confirmed by Virgina Norfolk [phone]) on 10/16/2019 8:27:51 PM   Radiology DG Chest Portable 1 View  Result Date: 10/16/2019 CLINICAL DATA:  34 y.o female arrived via [place] from home. C/O N/V and abdominal pain X2 days. Hx. DM EXAM: PORTABLE CHEST 1 VIEW COMPARISON:  03/17/2019 FINDINGS: Cardiac silhouette normal in size. No mediastinal or hilar masses. No evidence of adenopathy. Clear lungs.  No pleural effusion or pneumothorax. Skeletal structures are grossly intact. IMPRESSION: No active disease. Electronically Signed   By: Amie Portland M.D.   On: 10/16/2019 19:24    Procedures .Critical Care Performed by: Virgina Norfolk, DO Authorized by: Virgina Norfolk, DO   Critical care provider statement:    Critical care time (minutes):  40   Critical care was necessary to treat or prevent imminent or life-threatening deterioration of the following conditions:  Metabolic crisis   Critical care was time spent personally by me on the following activities:  Blood draw for specimens, development of treatment plan with patient or surrogate, discussions with primary provider, evaluation of patient's response to treatment, examination of patient, ordering and performing treatments and interventions, ordering and review of laboratory studies, ordering and review of radiographic studies, pulse oximetry, re-evaluation of patient's condition, review of old charts and obtaining history from patient or surrogate   I assumed direction of critical care for this patient from another provider in my specialty: no     (including critical care time)  Medications Ordered in ED Medications  insulin regular, human (MYXREDLIN) 100 units/ 100 mL infusion (has no administration in time range)  0.9 %  sodium chloride infusion (has no administration  in time range)  dextrose 5 %-0.45 % sodium chloride  infusion ( Intravenous Hold 10/16/19 2040)  dextrose 50 % solution 0-50 mL (has no administration in time range)  sodium chloride flush (NS) 0.9 % injection 3 mL (3 mLs Intravenous Given 10/16/19 2016)  sodium chloride 0.9 % bolus 1,000 mL (1,000 mLs Intravenous New Bag/Given 10/16/19 2016)  prochlorperazine (COMPAZINE) injection 10 mg (10 mg Intravenous Given 10/16/19 1903)  diphenhydrAMINE (BENADRYL) injection 25 mg (25 mg Intravenous Given 10/16/19 1903)    ED Course  I have reviewed the triage vital signs and the nursing notes.  Pertinent labs & imaging results that were available during my care of the patient were reviewed by me and considered in my medical decision making (see chart for details).    MDM Rules/Calculators/A&P                      Ulani Degrasse is a 34 year old female with history of diabetes who presents to the ED with nausea, vomiting, abdominal pain.  Patient with unremarkable vitals.  No fever.  Symptoms for the last several days.  History of DKA.  Self medicates with insulin that she buys over-the-counter at [facility].  Likely has DKA in the setting of poor management.  She denies any urinary symptoms.  No cough, no fever.  Patient is tachypneic, clinically dehydrated.  Lab work was consistent with DKA.  pH is 7.14, positive ketones in the blood and urine, bicarb less than 7.  Chest x-ray and urinalysis negative for infection.  No specific abdominal tenderness on exam.  Patient with normal hepatic function.  Bilirubin mildly elevated likely in the setting of dehydration.  Anion gap is 31.  Lipase normal.  Patient was given 2 L of normal saline bolus.  Started on IV maintenance fluids.  Started on IV insulin and admitted to medicine for further care for DKA.  Patient was given Compazine and Benadryl for nausea. No concern for intra-abdominal process at this time.  This chart was dictated using voice recognition software.  Despite best efforts to proofread,  errors can  occur which can change the documentation meaning.    Final Clinical Impression(s) / ED Diagnoses Final diagnoses:  Diabetic ketoacidosis without coma associated with other specified diabetes mellitus [facility])    Rx / DC Orders ED Discharge Orders    None       Virgina Norfolk, DO 10/16/19 2141

## 2019-10-16 NOTE — ED Notes (Signed)
EKG given to EDP,Curatolo,MD., for review. 

## 2019-10-16 NOTE — H&P (Signed)
History and Physical    PLEASE NOTE THAT DRAGON DICTATION SOFTWARE WAS USED IN THE CONSTRUCTION OF THIS NOTE.   [address] Madison Williams DGL:[id number] DOB: 1985/11/18 DOA: 10/16/2019  PCP: Patient, No Pcp Per Patient coming from: home   I have personally briefly reviewed patient's old medical records in [place]  Chief Complaint: Nausea and vomiting  HPI: Madison Williams is a 34 y.o. female with medical history significant for poorly controlled type 1 diabetes mellitus complicated by recurrent episodes of DKA, mild intermittent asthma, chronic tobacco abuse, who is admitted to [facility] on 10/16/2019 with diabetic ketoacidosis after presenting from home to [facility] Emergency Department complaining of nausea vomiting.   The patient reports 2 days of nausea resulting in at least a total of 10 episodes of nonbloody, nonbilious emesis over that time.  She reports that this is associated with generalized cramping abdominal discomfort that worsens with the active vomiting as well as with palpation of the abdomen.  Denies any associated diarrhea, melena, or hematochezia.  Denies any recent travel or sick contacts.  Denies any associated subjective fever, chills, rigors, or generalized myalgias.  No recent trauma. Denies any recent headache, neck stiffness, rhinitis, rhinorrhea, sore throat, sob, cough, or rash. No known COVID-19 exposures. Denies dysuria, gross hematuria, or change in urinary urgency/frequency.  Denies any associated chest pain, palpitations, diaphoresis.   The patient reports good compliance with her outpatient insulin regimen, which exclusively consists of 70/30 insulin, in which the patient reports that she takes an average of a total of 60 units/day, split into 3 doses guided by an element of sliding scale dictated by preprandial blood sugars.  The patient reports that she is not currently seeing a diabetic educator, but she reports relief that she  would benefit from some additional patient education on this front.  Denies any use of recreational drugs.  Of note, most recent hemoglobin A1c was found to be 11.1% when checked on 04/02/2019.    ED Course:  Vital signs in the ED were notable for the following: Temperature max 97.3; heart rate 83; blood pressure 146/88; respiratory rate 23, and oxygen saturation 100% room air.  Labs were notable for the following: VBG showed 7.145/less than 19.  CMP notable for sodium 133, potassium 5.0, chloride 96, bicarbonate less than 7, anion gap 31, BUN 22, creatinine 1.24 relative to most recent prior creatinine value of 0.56 on 04/06/2019, glucose 490, alkaline phosphatase 94, AST 19, ALT 28, total bilirubin 2.6.  Lipase 16.  Beta hydroxybutyric acid greater than 8.0.  CBC notable for white blood cell count of 11,700 with 85% neutrophils.  Urinalysis showed 0-5 white blood cells, rare bacteria, nitrate negative, leukocyte Estrace negative, 100 protein, and greater than 500 glucose.  Nasopharyngeal COVID-19 PCR was checked, with result currently pending.  Chest x-ray shows no evidence of acute cardiopulmonary process, including no evidence of infiltrate, edema, pleural effusion, or pneumothorax.  EKG showed sinus rhythm with heart rate 97, QTc 493 ms, T wave inversion in V1 through V3, which was unchanged relative to most recent prior EKG on 04/03/2019, and no evidence of ST changes, including no evidence of ST elevation.   While in the ED, the following were administered: 1 L normal saline bolus followed by initiation of maintenance normal saline at 125 cc/h.  Additionally, insulin drip was initiated.  Subsequently, the patient was admitted to the stepdown unit for further evaluation and management presenting diabetic ketoacidosis.    Review of  Systems: As per HPI otherwise 10 point review of systems negative.   Past Medical History:  Diagnosis Date  . Asthma   . Diabetes mellitus without complication (HCC)     . Rheumatoid arthritis [facility])     Past Surgical History:  Procedure Laterality Date  . LAPAROSCOPIC OVARIAN      Social History:  reports that she has been smoking. She has been smoking about 1.00 pack per day. She has never used smokeless tobacco. She reports current alcohol use. She reports that she does not use drugs.   No Known Allergies  History reviewed. No pertinent family history.   Prior to Admission medications   Medication Sig Start Date End Date Taking? Authorizing Provider  albuterol (PROVENTIL HFA;VENTOLIN HFA) 108 (90 Base) MCG/ACT inhaler Inhale 2 puffs into the lungs every 6 (six) hours as needed for wheezing or shortness of breath.   Yes [provider]  insulin aspart (NOVOLOG) 100 UNIT/ML injection Inject 0-9 Units into the skin 3 (three) times daily with meals. Patient taking differently: Inject 0-11 Units into the skin 3 (three) times daily with meals.  04/06/19  Yes Claudean Severance, MD  insulin aspart protamine- aspart (NOVOLOG MIX 70/30) (70-30) 100 UNIT/ML injection Inject 11-20 Units into the skin in the morning, at noon, and at bedtime.   Yes [provider]  alum & mag hydroxide-simeth (MAALOX/MYLANTA) 200-200-20 MG/5ML suspension Take 15 mLs by mouth every 4 (four) hours as needed for indigestion or heartburn. Patient not taking: Reported on 04/02/2019 04/01/18   Rolly Salter, MD  famotidine (PEPCID) 20 MG tablet Take 1 tablet (20 mg total) by mouth daily. Patient not taking: Reported on 04/02/2019 04/01/18 04/01/28  Rolly Salter, MD  insulin glargine (LANTUS) 100 UNIT/ML injection Inject 0.25 mLs (25 Units total) into the skin daily. Patient not taking: Reported on 10/16/2019 04/06/19   Claudean Severance, MD  nicotine (NICODERM CQ - DOSED IN MG/24 HOURS) 14 mg/24hr patch Place 1 patch (14 mg total) onto the skin daily. Patient not taking: Reported on 01/30/2019 04/01/18   Rolly Salter, MD     Objective    Physical Exam: Vitals:    10/16/19 1841 10/16/19 1845  BP:  (!) 146/88  Pulse:  83  Resp:  (!) 23  Temp:  (!) 97.3 F (36.3 C)  TempSrc:  Oral  SpO2:  100%  Weight: 54.4 kg   Height: 5\' 7"  (1.702 m)     General: appears to be stated age; alert, oriented Skin: warm, dry, no rash Head:  AT/ Eyes:  PEARL b/l, EOMI Mouth:  Oral mucosa membranes appear dry, normal dentition Neck: supple; trachea midline Heart:  RRR; did not appreciate any M/R/G Lungs: CTAB, did not appreciate any wheezes, rales, or rhonchi Abdomen: + BS; soft, ND; mild generalized tenderness to palpation in the absence of any associated guarding, rigidity, or rebound tenderness. Vascular: 2+ pedal pulses b/l; 2+ radial pulses b/l Extremities: no peripheral edema, no muscle wasting Neuro: strength and sensation intact in upper and lower extremities b/l   Labs on Admission: I have personally reviewed following labs and imaging studies  CBC: Recent Labs  Lab 10/16/19 1945  WBC 11.2*  NEUTROABS 9.5*  HGB 16.0*  HCT 48.8*  MCV 99.6  PLT 295   Basic Metabolic Panel: Recent Labs  Lab 10/16/19 1945  NA 133*  K 5.0  CL 96*  CO2 <7*  GLUCOSE 490*  BUN 22*  CREATININE 1.24*  CALCIUM 8.8*  GFR: Estimated Creatinine Clearance: 55.9 mL/min (A) (by C-G formula based on SCr of 1.24 mg/dL (H)). Liver Function Tests: Recent Labs  Lab 10/16/19 1945  AST 19  ALT 28  ALKPHOS 94  BILITOT 2.6*  PROT 8.6*  ALBUMIN 4.9   Recent Labs  Lab 10/16/19 1945  LIPASE 16   No results for input(s): AMMONIA in the last 168 hours. Coagulation Profile: No results for input(s): INR, PROTIME in the last 168 hours. Cardiac Enzymes: No results for input(s): CKTOTAL, CKMB, CKMBINDEX, TROPONINI in the last 168 hours. BNP (last 3 results) No results for input(s): PROBNP in the last 8760 hours. HbA1C: No results for input(s): HGBA1C in the last 72 hours. CBG: Recent Labs  Lab 10/16/19 1906  GLUCAP 490*   Lipid Profile: No results for  input(s): CHOL, HDL, LDLCALC, TRIG, CHOLHDL, LDLDIRECT in the last 72 hours. Thyroid Function Tests: No results for input(s): TSH, T4TOTAL, FREET4, T3FREE, THYROIDAB in the last 72 hours. Anemia Panel: No results for input(s): VITAMINB12, FOLATE, FERRITIN, TIBC, IRON, RETICCTPCT in the last 72 hours. Urine analysis:    Component Value Date/Time   COLORURINE YELLOW 04/02/2019 1914   APPEARANCEUR HAZY (A) 04/02/2019 1914   LABSPEC 1.019 04/02/2019 1914   PHURINE 5.0 04/02/2019 1914   GLUCOSEU 50 (A) 04/02/2019 1914   HGBUR SMALL (A) 04/02/2019 1914   BILIRUBINUR NEGATIVE 04/02/2019 1914   KETONESUR 80 (A) 04/02/2019 1914   PROTEINUR >=300 (A) 04/02/2019 1914   NITRITE NEGATIVE 04/02/2019 1914   LEUKOCYTESUR NEGATIVE 04/02/2019 1914    Radiological Exams on Admission: DG Chest Portable 1 View  Result Date: 10/16/2019 CLINICAL DATA:  34 y.o female arrived via [facility] from home. C/O N/V and abdominal pain X2 days. Hx. DM EXAM: PORTABLE CHEST 1 VIEW COMPARISON:  03/17/2019 FINDINGS: Cardiac silhouette normal in size. No mediastinal or hilar masses. No evidence of adenopathy. Clear lungs.  No pleural effusion or pneumothorax. Skeletal structures are grossly intact. IMPRESSION: No active disease. Electronically Signed   By: Amie Portland M.D.   On: 10/16/2019 19:24     EKG: Independently reviewed, with result as described above.    Assessment/Plan   Ebonique Zoriyah Scheidegger is a 34 y.o. female with medical history significant for poorly controlled type 1 diabetes mellitus complicated by recurrent episodes of DKA, mild intermittent asthma, chronic tobacco abuse, who is admitted to [facility] on 10/16/2019 with diabetic ketoacidosis after presenting from home to [facility] complaining of nausea vomiting.    Principal Problem:   DKA (diabetic ketoacidoses) (HCC) Active Problems:   AKI (acute kidney injury) (HCC)   Leukocytosis   Tobacco use disorder    Type 1 diabetes mellitus with hyperglycemia (HCC)   Mild intermittent asthma without complication   Nausea & vomiting   Abdominal pain   #) Diabetic ketoacidosis: In the setting of a known history of type 1 diabetes, on home insulin in the form of sliding scale 70/30 insulin 3 times daily with meals with daily average of 60 units, is admitted this evening with dka on the basis of presenting blood sugar of 490 associated with a presenting VBG demonstrating a metabolic acidosis, while presenting CMP consistent with an anion gap metabolic acidosis with serum bicarbonate of less than 7 and anion gap of 31, and beta hydroxybutyric acid elevated at greater than 8.0. Additional presenting labs notable for corrected serum sodium of 139 after adjusting for hyperglycemia and serum potassium of 5.0.  Patient's diabetes appears to be  poorly controlled as an outpatient with a most recent A1c of 11.1 on 04/02/2019.  While the patient reports good compliance with her outpatient insulin, I suspect that her recurrent episodes of DKA are on the basis of suboptimal glycemic management as an outpatient, which might benefit from additional patient education via input from diabetic educator.  Of note, patient conveys that she is amenable to meeting with diabetic educator for this purpose. No evidence at this time to suggest underlying infectious etiology, including urinalysis not suggestive of underlying UTI, while presenting chest x-ray shows no evidence of acute cardiopulmonary process.  Of note, routine screening nasopharyngeal COVID-19 PCR was collected in the ED this evening, with result currently pending. presenting EKG demonstrates no evidence of acute ischemic changes.   In the emergency department the patient received IV normal saline bolus 1 L, followed by transition to maintenance normal saline at 125 cc/h in the context of a corrected serum sodium level of 139.  In the setting of initial potassium of 5.0, with  anticipation of intracellular shift due to insulin drip, will change current IV fluids to half NS plus KCl 10 mEq/L at 125 cc/h, with plan to change this to D5-half NS plus KCl 10 mEq/L at 125 cc/h once Accu-Cheks reflect that blood sugar has decreased to less than 250.  As I have discussed with the patient this evening, will plan to continue insulin drip overnight, even if anion gap closes in the meantime.   Plan: DKA protocol initiated. Insulin drip per protocol. Q1 hour Accuchecks. Q4H BMP's in order to monitor ensuing anion gap, serum sodium, serum potassium. NPO.  IV fluids, as described above.  Check serum magnesium and phosphorus levels, with as needed supplementation per protocol.  As described above, will plan to continue insulin drip overnight even if anion gap closes in the meantime.  Tomorrow morning, once anion gap is closed and the patient is tolerating p.o.,will reinitiate basal insulin while continuing insulin drip for an additional two hours before ultimately turning off the insulin drip. Until then, holding home insulin. Monitor on telemetry. Monitor strict I's and O's.  Check A1c. An inpatient consult to the diabetic educator has been placed.  In the setting of mildly prolonged QTC, I have ordered as needed IV Ativan as antiemetic.  As needed IV fentanyl for abdominal discomfort.  Check urinary drug screen.     #) Acute kidney injury presenting serum creatinine noted to be 1.24 relative to most recent prior value of 0.56 on 04/06/2019.  Suspect this is prerenal in nature Izediuno intravascular depletion due to dehydration from at least 10 episodes of nonbloody, nonbilious emesis over the last 2 days in the context of developing DKA, as above.  Presenting urinalysis shows 100 protein as well as greater than 500 glucose, as further described above.  Plan: IV fluids in the setting of DKA, as further described above.  Monitor strict I's and O's and daily weights.  Attempt avoid nephrotoxic  agents.  We will be monitoring renal function per aforementioned every 4 hours BMPs as component of DKA evaluation and management.  Add on random urine sodium as well as random urine creatinine.      #) Leukocytosis: Presenting CBC demonstrates white blood cell count of 11,200 with 85% neutrophils.  Suspect that this represents an element of hemoconcentration in the setting of presenting dehydration as well as potential inflammatory component due to several episodes of nausea/vomiting over the last 2 days.  At this time, no evidence of  overt underlying infectious process, including no evidence of urinary tract infection.  Additionally, chest x-ray shows no evidence of acute cardiopulmonary process.  As described above, result of COVID-19 PCR collected in the ED this evening is currently pending.   Plan: Monitor for result of nasopharyngeal COVID-19 PCR collected in the ED this evening.  Repeat CBC differential in the morning.  Work-up and management of presenting DKA, as above, including IV fluids.      #) Mild intermittent asthma: On as needed albuterol inhaler as well element of outpatient respiratory regimen.  No evidence of acute asthma exacerbation at this time.  Plan: Continue as needed albuterol inhaler.  As needed supplemental oxygen order to maintain oxygen saturations greater than or equal to 92%.  Counseled the patient on the importance of complete smoking discontinuation, as further described below.      #) Chronic tobacco abuse: The patient reports that she is a current smoker, having smoked approximately 1 pack/day over the last 10 years.  Plan: Counseled the patient on importance of complete smoking discontinuation, particularly in the setting of underlying asthma.  I ordered a as needed nicotine patch for use during this hospitalization.     DVT prophylaxis: Lovenox 40 mg subcu daily Code Status: Full code Family Communication: none Disposition Plan: Per Rounding  Team Consults called: none  Admission status: Inpatient; stepdown unit.    PLEASE NOTE THAT DRAGON DICTATION SOFTWARE WAS USED IN THE CONSTRUCTION OF THIS NOTE.   Madison Fava DO [facility] Pager [phone] From 6PM- 2AM.   Otherwise, please contact night-coverage  [email or web address] Password [facility]  10/16/2019, 8:37 PM

## 2019-10-17 ENCOUNTER — Other Ambulatory Visit: Payer: Self-pay

## 2019-10-17 DIAGNOSIS — E1010 Type 1 diabetes mellitus with ketoacidosis without coma: Principal | ICD-10-CM

## 2019-10-17 LAB — BASIC METABOLIC PANEL
Anion gap: 19 — ABNORMAL HIGH (ref 5–15)
Anion gap: 15 (ref 5–15)
Anion gap: 12 (ref 5–15)
BUN: 17 mg/dL (ref 6–20)
BUN: 14 mg/dL (ref 6–20)
BUN: 13 mg/dL (ref 6–20)
CO2: 9 mmol/L — ABNORMAL LOW (ref 22–32)
CO2: 15 mmol/L — ABNORMAL LOW (ref 22–32)
CO2: 19 mmol/L — ABNORMAL LOW (ref 22–32)
Calcium: 8.6 mg/dL — ABNORMAL LOW (ref 8.9–10.3)
Calcium: 8.9 mg/dL (ref 8.9–10.3)
Calcium: 8.6 mg/dL — ABNORMAL LOW (ref 8.9–10.3)
Chloride: 109 mmol/L (ref 98–111)
Chloride: 106 mmol/L (ref 98–111)
Chloride: 107 mmol/L (ref 98–111)
Creatinine, Ser: 0.98 mg/dL (ref 0.44–1.00)
Creatinine, Ser: 0.68 mg/dL (ref 0.44–1.00)
Creatinine, Ser: 0.62 mg/dL (ref 0.44–1.00)
GFR calc Af Amer: >60 mL/min (ref >60)
GFR calc Af Amer: >60 mL/min (ref >60)
GFR calc Af Amer: >60 mL/min (ref >60)
GFR calc non Af Amer: >60 mL/min (ref >60)
GFR calc non Af Amer: >60 mL/min (ref >60)
GFR calc non Af Amer: >60 mL/min (ref >60)
Glucose, Bld: 178 mg/dL — ABNORMAL HIGH (ref 70–99)
Glucose, Bld: 226 mg/dL — ABNORMAL HIGH (ref 70–99)
Glucose, Bld: 196 mg/dL — ABNORMAL HIGH (ref 70–99)
Potassium: 4.3 mmol/L (ref 3.5–5.1)
Potassium: 4.1 mmol/L (ref 3.5–5.1)
Potassium: 3.5 mmol/L (ref 3.5–5.1)
Sodium: 137 mmol/L (ref 135–145)
Sodium: 136 mmol/L (ref 135–145)
Sodium: 138 mmol/L (ref 135–145)

## 2019-10-17 LAB — GLUCOSE, CAPILLARY
Glucose-Capillary: 161 mg/dL — ABNORMAL HIGH (ref 70–99)
Glucose-Capillary: 166 mg/dL — ABNORMAL HIGH (ref 70–99)
Glucose-Capillary: 190 mg/dL — ABNORMAL HIGH (ref 70–99)
Glucose-Capillary: 175 mg/dL — ABNORMAL HIGH (ref 70–99)
Glucose-Capillary: 160 mg/dL — ABNORMAL HIGH (ref 70–99)

## 2019-10-17 LAB — HEMOGLOBIN A1C
Hgb A1c MFr Bld: 10.8 % — ABNORMAL HIGH (ref 4.8–5.6)
Mean Plasma Glucose: 263.26 mg/dL

## 2019-10-17 LAB — CBG MONITORING, ED
Glucose-Capillary: 157 mg/dL — ABNORMAL HIGH (ref 70–99)
Glucose-Capillary: 192 mg/dL — ABNORMAL HIGH (ref 70–99)
Glucose-Capillary: 193 mg/dL — ABNORMAL HIGH (ref 70–99)
Glucose-Capillary: 173 mg/dL — ABNORMAL HIGH (ref 70–99)
Glucose-Capillary: 187 mg/dL — ABNORMAL HIGH (ref 70–99)
Glucose-Capillary: 145 mg/dL — ABNORMAL HIGH (ref 70–99)
Glucose-Capillary: 187 mg/dL — ABNORMAL HIGH (ref 70–99)
Glucose-Capillary: 240 mg/dL — ABNORMAL HIGH (ref 70–99)
Glucose-Capillary: 167 mg/dL — ABNORMAL HIGH (ref 70–99)
Glucose-Capillary: 196 mg/dL — ABNORMAL HIGH (ref 70–99)
Glucose-Capillary: 234 mg/dL — ABNORMAL HIGH (ref 70–99)
Glucose-Capillary: 235 mg/dL — ABNORMAL HIGH (ref 70–99)
Glucose-Capillary: 180 mg/dL — ABNORMAL HIGH (ref 70–99)
Glucose-Capillary: 197 mg/dL — ABNORMAL HIGH (ref 70–99)
Glucose-Capillary: 187 mg/dL — ABNORMAL HIGH (ref 70–99)
Glucose-Capillary: 207 mg/dL — ABNORMAL HIGH (ref 70–99)

## 2019-10-17 LAB — MAGNESIUM: Magnesium: 2 mg/dL (ref 1.7–2.4)

## 2019-10-17 LAB — BASIC METABOLIC PANEL WITH GFR
Anion gap: 17 — ABNORMAL HIGH (ref 5–15)
BUN: 15 mg/dL (ref 6–20)
CO2: 14 mmol/L — ABNORMAL LOW (ref 22–32)
Calcium: 9.1 mg/dL (ref 8.9–10.3)
Chloride: 106 mmol/L (ref 98–111)
Creatinine, Ser: 0.86 mg/dL (ref 0.44–1.00)
GFR calc Af Amer: >60 mL/min
GFR calc non Af Amer: >60 mL/min
Glucose, Bld: 225 mg/dL — ABNORMAL HIGH (ref 70–99)
Potassium: 4 mmol/L (ref 3.5–5.1)
Sodium: 137 mmol/L (ref 135–145)

## 2019-10-17 LAB — CBC WITH DIFFERENTIAL/PLATELET
Abs Immature Granulocytes: 0.07 10*3/uL (ref 0.00–0.07)
Basophils Absolute: 0 10*3/uL (ref 0.0–0.1)
Basophils Relative: 0 %
Eosinophils Absolute: 0 10*3/uL (ref 0.0–0.5)
Eosinophils Relative: 0 %
HCT: 43.8 % (ref 36.0–46.0)
Hemoglobin: 14.9 g/dL (ref 12.0–15.0)
Immature Granulocytes: 1 %
Lymphocytes Relative: 18 %
Lymphs Abs: 2.3 10*3/uL (ref 0.7–4.0)
MCH: 32.6 pg (ref 26.0–34.0)
MCHC: 34 g/dL (ref 30.0–36.0)
MCV: 95.8 fL (ref 80.0–100.0)
Monocytes Absolute: 1.5 10*3/uL — ABNORMAL HIGH (ref 0.1–1.0)
Monocytes Relative: 12 %
Neutro Abs: 8.8 10*3/uL — ABNORMAL HIGH (ref 1.7–7.7)
Neutrophils Relative %: 69 %
Platelets: 277 10*3/uL (ref 150–400)
RBC: 4.57 MIL/uL (ref 3.87–5.11)
RDW: 13.5 % (ref 11.5–15.5)
WBC: 12.8 10*3/uL — ABNORMAL HIGH (ref 4.0–10.5)
nRBC: 0 % (ref 0.0–0.2)

## 2019-10-17 LAB — COMPREHENSIVE METABOLIC PANEL
ALT: 24 U/L (ref 0–44)
AST: 19 U/L (ref 15–41)
Albumin: 4.3 g/dL (ref 3.5–5.0)
Alkaline Phosphatase: 78 U/L (ref 38–126)
Anion gap: 15 (ref 5–15)
BUN: 15 mg/dL (ref 6–20)
CO2: 13 mmol/L — ABNORMAL LOW (ref 22–32)
Calcium: 8.9 mg/dL (ref 8.9–10.3)
Chloride: 109 mmol/L (ref 98–111)
Creatinine, Ser: 0.88 mg/dL (ref 0.44–1.00)
GFR calc Af Amer: >60 mL/min (ref >60)
GFR calc non Af Amer: >60 mL/min (ref >60)
Glucose, Bld: 196 mg/dL — ABNORMAL HIGH (ref 70–99)
Potassium: 4.3 mmol/L (ref 3.5–5.1)
Sodium: 137 mmol/L (ref 135–145)
Total Bilirubin: 1.9 mg/dL — ABNORMAL HIGH (ref 0.3–1.2)
Total Protein: 7.8 g/dL (ref 6.5–8.1)

## 2019-10-17 LAB — MRSA PCR SCREENING: MRSA by PCR: NEGATIVE

## 2019-10-17 LAB — PHOSPHORUS: Phosphorus: 1.4 mg/dL — ABNORMAL LOW (ref 2.5–4.6)

## 2019-10-17 LAB — SARS CORONAVIRUS 2 (TAT 6-24 HRS): SARS Coronavirus 2: NEGATIVE

## 2019-10-17 MED ORDER — ONDANSETRON HCL 4 MG/2ML IJ SOLN
4.0000 mg | Freq: Four times a day (QID) | INTRAMUSCULAR | Status: DC | PRN
  Administered 2019-10-17 – 2019-10-19 (×6): 4 mg via INTRAVENOUS
  Filled 2019-10-17 (×6): qty 2

## 2019-10-17 MED ORDER — PROMETHAZINE HCL 25 MG/ML IJ SOLN
12.5000 mg | Freq: Once | INTRAMUSCULAR | Status: AC
  Administered 2019-10-17: 12.5 mg via INTRAVENOUS
  Filled 2019-10-17: qty 1

## 2019-10-17 MED ORDER — INSULIN ASPART 100 UNIT/ML ~~LOC~~ SOLN
0.0000 [IU] | Freq: Every day | SUBCUTANEOUS | Status: DC
  Administered 2019-10-18: 3 [IU] via SUBCUTANEOUS

## 2019-10-17 MED ORDER — CHLORHEXIDINE GLUCONATE CLOTH 2 % EX PADS
6.0000 | MEDICATED_PAD | Freq: Every day | CUTANEOUS | Status: DC
  Administered 2019-10-17: 6 via TOPICAL

## 2019-10-17 MED ORDER — INSULIN GLARGINE 100 UNIT/ML ~~LOC~~ SOLN
20.0000 [IU] | Freq: Every day | SUBCUTANEOUS | Status: DC
  Administered 2019-10-17: 21:00:00 20 [IU] via SUBCUTANEOUS
  Filled 2019-10-17 (×2): qty 0.2

## 2019-10-17 MED ORDER — INSULIN ASPART 100 UNIT/ML ~~LOC~~ SOLN
0.0000 [IU] | Freq: Three times a day (TID) | SUBCUTANEOUS | Status: DC
  Administered 2019-10-18 (×2): 1 [IU] via SUBCUTANEOUS
  Administered 2019-10-18 – 2019-10-19 (×2): 7 [IU] via SUBCUTANEOUS

## 2019-10-17 NOTE — ED Notes (Signed)
12:05 FSBS=235. Insulin drip to remain at 4mg /hr per Endo protocol. Pt is A/Ox3. Skin w/d/pink. Sts feeling better. Cont to await admission room. Cont to monitor.

## 2019-10-17 NOTE — Progress Notes (Signed)
PROGRESS NOTE    Madison Williams  PYK:[id number] DOB: 1985/09/29 DOA: 10/16/2019 PCP: Patient, No Pcp Per  Brief Narrative:  HPI: Madison Williams is a 34 y.o. female with w/ poorly controlled type 1 diabetes mellitus complicated by recurrent episodes of DKA, mild intermittent asthma, chronic tobacco abuse, who is admitted to [facility] on 10/16/2019 with diabetic ketoacidosis after presenting from home to [facility] Emergency Department complaining of nausea vomiting.  The patient reported good compliance with insulin may have missed one dose only in the last few days ED Course: Labs were notable for blood glucose of 490 with bicarb of less than 7, anion gap of 31  -Admitted with DKA  Assessment & Plan:   Diabetic ketoacidosis -Trigger unclear, patient admits not missing more than a dose of insulin in the last few days -Continue insulin drip per Endo tool, D5 half-normal saline -Monitor BUN every 4 hour and magnesium -Transition to Lantus and sliding scale insulin when anion gap closes -Diabetic coordinator and case management consult, patient needs referral back to [facility] health wellness clinic for PCP needs, insulin supplies etc.  AKI -Prerenal -Resolved with hydration  Nausea and vomiting -Secondary to DKA, improving  Asthma -Stable, no wheezing  Tobacco use disorder -Counseled  DVT prophylaxis: Lovenox Code Status: Full code Family Communication: No family at bedside discussed extensively with the patient Disposition Plan: Home pending resolution of DKA and nausea vomiting  Consultants:  Salmon from the grill initiated from the grill  Procedures:   Antimicrobials:    Subjective: Feels better, some nausea  Objective: Vitals:   10/17/19 1045 10/17/19 1100 10/17/19 1115 10/17/19 1122  BP: (!) 144/83 135/78 (!) 146/84   Pulse: 73  77   Resp: 19 (!) 27 (!) 26   Temp:      TempSrc:      SpO2: 97% 97% 97%   Weight:    54.4 kg    Height:    5\' 7"  (1.702 m)    Intake/Output Summary (Last 24 hours) at 10/17/2019 1150 Last data filed at 10/17/2019 0941 Gross per 24 hour  Intake 1000 ml  Output --  Net 1000 ml   Filed Weights   10/16/19 1841 10/17/19 1122  Weight: 54.4 kg 54.4 kg    Examination:  General exam: AAOx3, thinly built female, no distress  Respiratory system: CTAB Cardiovascular system: S1 & S2 heard,  Gastrointestinal system: Abdomen is nondistended, soft and nontender.Normal bowel sounds heard. Central nervous system: Alert and oriented. No focal neurological deficits. Extremities: no edema  Skin: No rashes, lesions or ulcers Psychiatry: Judgement and insight appear normal. Mood & affect appropriate.     Data Reviewed:   CBC: Recent Labs  Lab 10/16/19 1945 10/17/19 0500  WBC 11.2* 12.8*  NEUTROABS 9.5* 8.8*  HGB 16.0* 14.9  HCT 48.8* 43.8  MCV 99.6 95.8  PLT 295 539   Basic Metabolic Panel: Recent Labs  Lab 10/16/19 1945 10/16/19 2200 10/17/19 0001 10/17/19 0500 10/17/19 0818  NA 133*  --  137 137 137  K 5.0  --  4.3 4.3 4.0  CL 96*  --  109 109 106  CO2 <7*  --  9* 13* 14*  GLUCOSE 490*  --  178* 196* 225*  BUN 22*  --  17 15 15   CREATININE 1.24*  --  0.98 0.88 0.86  CALCIUM 8.8*  --  8.6* 8.9 9.1  MG  --  2.4  --  2.0  --  PHOS  --   --   --  1.4*  --    GFR: Estimated Creatinine Clearance: 80.7 mL/min (by C-G formula based on SCr of 0.86 mg/dL). Liver Function Tests: Recent Labs  Lab 10/16/19 1945 10/17/19 0500  AST 19 19  ALT 28 24  ALKPHOS 94 78  BILITOT 2.6* 1.9*  PROT 8.6* 7.8  ALBUMIN 4.9 4.3   Recent Labs  Lab 10/16/19 1945  LIPASE 16   No results for input(s): AMMONIA in the last 168 hours. Coagulation Profile: No results for input(s): INR, PROTIME in the last 168 hours. Cardiac Enzymes: No results for input(s): CKTOTAL, CKMB, CKMBINDEX, TROPONINI in the last 168 hours. BNP (last 3 results) No results for input(s): PROBNP in the last  8760 hours. HbA1C: Recent Labs    10/17/19 0500  HGBA1C 10.8*   CBG: Recent Labs  Lab 10/17/19 0705 10/17/19 0802 10/17/19 0907 10/17/19 1009 10/17/19 1119  GLUCAP 187* 240* 167* 196* 234*   Lipid Profile: No results for input(s): CHOL, HDL, LDLCALC, TRIG, CHOLHDL, LDLDIRECT in the last 72 hours. Thyroid Function Tests: No results for input(s): TSH, T4TOTAL, FREET4, T3FREE, THYROIDAB in the last 72 hours. Anemia Panel: No results for input(s): VITAMINB12, FOLATE, FERRITIN, TIBC, IRON, RETICCTPCT in the last 72 hours. Urine analysis:    Component Value Date/Time   COLORURINE STRAW (A) 10/16/2019 1853   APPEARANCEUR CLEAR 10/16/2019 1853   LABSPEC 1.023 10/16/2019 1853   PHURINE 5.0 10/16/2019 1853   GLUCOSEU >=500 (A) 10/16/2019 1853   HGBUR SMALL (A) 10/16/2019 1853   BILIRUBINUR NEGATIVE 10/16/2019 1853   KETONESUR 80 (A) 10/16/2019 1853   PROTEINUR 100 (A) 10/16/2019 1853   NITRITE NEGATIVE 10/16/2019 1853   LEUKOCYTESUR NEGATIVE 10/16/2019 1853   Sepsis Labs: @LABRCNTIP (procalcitonin:4,lacticidven:4)  ) Recent Results (from the past 240 hour(s))  SARS CORONAVIRUS 2 (TAT 6-24 HRS) Nasopharyngeal Nasopharyngeal Swab     Status: None   Collection Time: 10/16/19  6:55 PM   Specimen: Nasopharyngeal Swab  Result Value Ref Range Status   SARS Coronavirus 2 NEGATIVE NEGATIVE Final    Comment: (NOTE) SARS-CoV-2 target nucleic acids are NOT DETECTED. The SARS-CoV-2 RNA is generally detectable in upper and lower respiratory specimens during the acute phase of infection. Negative results do not preclude SARS-CoV-2 infection, do not rule out co-infections with other pathogens, and should not be used as the sole basis for treatment or other patient management decisions. Negative results must be combined with clinical observations, patient history, and epidemiological information. The expected result is Negative. Fact Sheet for  Patients: 10/18/19 Fact Sheet for Healthcare Providers: HairSlick.no This test is not yet approved or cleared by the quierodirigir.com FDA and  has been authorized for detection and/or diagnosis of SARS-CoV-2 by FDA under an Emergency Use Authorization (EUA). This EUA will remain  in effect (meaning this test can be used) for the duration of the COVID-19 declaration under Section 56 4(b)(1) of the Act, 21 U.S.C. section 360bbb-3(b)(1), unless the authorization is terminated or revoked sooner. Performed at [facility], [address] [place], [place]          Radiology Studies: DG Chest Portable 1 View  Result Date: 10/16/2019 CLINICAL DATA:  34 y.o female arrived via [facility] from home. C/O N/V and abdominal pain X2 days. Hx. DM EXAM: PORTABLE CHEST 1 VIEW COMPARISON:  03/17/2019 FINDINGS: Cardiac silhouette normal in size. No mediastinal or hilar masses. No evidence of adenopathy. Clear lungs.  No pleural effusion  or pneumothorax. Skeletal structures are grossly intact. IMPRESSION: No active disease. Electronically Signed   By: Amie Portland M.D.   On: 10/16/2019 19:24        Scheduled Meds: . enoxaparin (LOVENOX) injection  40 mg Subcutaneous Q24H  . sodium chloride flush  3 mL Intravenous Q12H   Continuous Infusions: . dextrose 5 % and 0.45 % NaCl with KCl 10 mEq/L 125 mL/hr at 10/17/19 0942  . insulin 4 Units/hr (10/17/19 1121)     LOS: 1 day    Time spent:  Zannie Cove, MD [facility]  10/17/2019, 11:50 AM

## 2019-10-17 NOTE — Progress Notes (Signed)
Inpatient Diabetes Program Recommendations  AACE/ADA: New Consensus Statement on Inpatient Glycemic Control (2015)  Target Ranges:  Prepandial:   less than 140 mg/dL      Peak postprandial:   less than 180 mg/dL (1-2 hours)      Critically ill patients:  140 - 180 mg/dL   Lab Results  Component Value Date   GLUCAP 197 (H) 10/17/2019   HGBA1C 10.8 (H) 10/17/2019    Review of Glycemic Control  Diabetes history: DM1 Outpatient Diabetes medications: Novolog 70/30 11-20 units tidwc (average total of 60 units/day), Novolog 0-11 units tidwc Current orders for Inpatient glycemic control: IV insulin per End[employer] in DKA mode.  HgbA1C 10.8%  Awaiting admission to SD/ICU.  Inpatient Diabetes Program Recommendations:     Continue IV insulin until criteria met for discontinuation. Give basal insulin 1-2H prior to discontinuation of drip.  Note: pt was seen by Diabetes Coordinator at previous hospitalization. HgbA1C has improved from 11.1% to 10.8% in the past 6 months.  Pt does not have insurance, therefore will need affordable insulin at discharge. Has No PCP.   Will continue to follow.   Thank you. Lorenda Peck, RD, LDN, CDE Inpatient Diabetes Coordinator [phone]  Addendum: TOC CM made appt at [facility] on 11/11/19 @ 2:30 pm. Will get [place] letter if d/cing over weekend for the Lantus and Novolog pens.

## 2019-10-17 NOTE — ED Notes (Signed)
Report called to SDU but requested to hold off per Rayann Heman, Diplomatic Services operational officer until further notice.

## 2019-10-17 NOTE — TOC Initial Note (Addendum)
Transition of Care [facility]) - Initial/Assessment Note    Patient Details  Name: Madison Williams MRN: [id number] Date of Birth: 09/12/1985  Transition of Care [facility]) CM/SW Contact:    Erenest Rasher, RN Phone Number: 10/17/2019, 3:12 PM  Clinical Narrative:                 TOC CM spoke to pt and states she was buying her insulin over the counter. Explained [facility] to establish with PCP and utilizing pharmacy to pick up meds. Explained she will be able to get the first fill free. If dc on weekend, will utilize [facility] with a $3 copay with a once per year use. Appt arranged for [facility] on 11/11/2019 at 2:30 pm. Provided pt with a brochure. Updated attending.   Expected Discharge Plan: Home/Self Care Barriers to Discharge: Continued Medical Work up   Patient Goals and CMS Choice        Expected Discharge Plan and Services Expected Discharge Plan: Home/Self Care   Discharge Planning Services: CM Consult                                          Prior Living Arrangements/Services   Lives with:: Parents(grandmother) Patient language and need for interpreter reviewed:: Yes        Need for Family Participation in Patient Care: No (Comment) Care giver support system in place?: No (comment)   Criminal Activity/Legal Involvement Pertinent to Current Situation/Hospitalization: No - Comment as needed  Activities of Daily Living Home Assistive Devices/Equipment: CBG Meter ADL Screening (condition at time of admission) Patient's cognitive ability adequate to safely complete daily activities?: Yes Is the patient deaf or have difficulty hearing?: No Does the patient have difficulty seeing, even when wearing glasses/contacts?: No Does the patient have difficulty concentrating, remembering, or making decisions?: No Patient able to express need for assistance with ADLs?: Yes Does the patient have difficulty dressing or bathing?: No Independently performs ADLs?: Yes  (appropriate for developmental age) Does the patient have difficulty walking or climbing stairs?: No Weakness of Legs: None Weakness of Arms/Hands: None  Permission Sought/Granted Permission sought to share information with : Case Manager, PCP, Family Supports Permission granted to share information with : Yes, Verbal Permission Granted  Share Information with NAME: Aleen Campi  Permission granted to share info w AGENCY: [facility]  Permission granted to share info w Relationship: grandmother  Permission granted to share info w Contact Information: [phone]  Emotional Assessment Appearance:: Appears stated age Attitude/Demeanor/Rapport: Engaged Affect (typically observed): Accepting Orientation: : Oriented to Self, Oriented to Place, Oriented to  Time, Oriented to Situation Alcohol / Substance Use: Tobacco Use Psych Involvement: No (comment)  Admission diagnosis:  DKA (diabetic ketoacidoses) ([place]) [E11.10] Patient Active Problem List   Diagnosis Date Noted  . Nausea & vomiting 10/16/2019  . Abdominal pain 10/16/2019  . Chest tightness   . Mild intermittent asthma without complication   . Acute vomiting 01/31/2019  . Type 1 diabetes mellitus with hyperglycemia ([place]) 01/31/2019  . Uninsured 01/31/2019  . DKA (diabetic ketoacidoses) ([place]) 03/30/2018  . AKI (acute kidney injury) ([place]) 03/30/2018  . Leukocytosis 03/30/2018  . Dehydration 03/30/2018  . Tobacco use disorder 03/30/2018   PCP:  Patient, No Pcp Per Pharmacy:   [place], [place] [address] Phone: [phone]  Fax: [phone]  [facility] [place], [place] - [address] [place] [address] Phone: [phone] Fax: [phone]  [facility] [address] [place] - [address] [place] [address] Phone: [phone] Fax:  [phone]     Social Determinants of Health (SDOH) Interventions    Readmission Risk Interventions No flowsheet data found.

## 2019-10-17 NOTE — ED Notes (Signed)
Transferred to SDU via stretcher with IV, monitor, RN and belongings. Pt A/Ox4. Skin w/d/pink. NAD. No complaints voiced.

## 2019-10-18 DIAGNOSIS — J4520 Mild intermittent asthma, uncomplicated: Secondary | ICD-10-CM

## 2019-10-18 LAB — CBC
HCT: 44.3 % (ref 36.0–46.0)
Hemoglobin: 14.6 g/dL (ref 12.0–15.0)
MCH: 31.9 pg (ref 26.0–34.0)
MCHC: 33 g/dL (ref 30.0–36.0)
MCV: 96.7 fL (ref 80.0–100.0)
Platelets: 228 10*3/uL (ref 150–400)
RBC: 4.58 MIL/uL (ref 3.87–5.11)
RDW: 13.6 % (ref 11.5–15.5)
WBC: 7 10*3/uL (ref 4.0–10.5)
nRBC: 0 % (ref 0.0–0.2)

## 2019-10-18 LAB — GLUCOSE, CAPILLARY
Glucose-Capillary: 321 mg/dL — ABNORMAL HIGH (ref 70–99)
Glucose-Capillary: 122 mg/dL — ABNORMAL HIGH (ref 70–99)
Glucose-Capillary: 147 mg/dL — ABNORMAL HIGH (ref 70–99)
Glucose-Capillary: 263 mg/dL — ABNORMAL HIGH (ref 70–99)

## 2019-10-18 LAB — BASIC METABOLIC PANEL
Anion gap: 17 — ABNORMAL HIGH (ref 5–15)
Anion gap: 11 (ref 5–15)
BUN: 11 mg/dL (ref 6–20)
BUN: 11 mg/dL (ref 6–20)
CO2: 16 mmol/L — ABNORMAL LOW (ref 22–32)
CO2: 23 mmol/L (ref 22–32)
Calcium: 8.8 mg/dL — ABNORMAL LOW (ref 8.9–10.3)
Calcium: 8.7 mg/dL — ABNORMAL LOW (ref 8.9–10.3)
Chloride: 102 mmol/L (ref 98–111)
Chloride: 99 mmol/L (ref 98–111)
Creatinine, Ser: 0.7 mg/dL (ref 0.44–1.00)
Creatinine, Ser: 0.63 mg/dL (ref 0.44–1.00)
GFR calc Af Amer: >60 mL/min (ref >60)
GFR calc Af Amer: >60 mL/min (ref >60)
GFR calc non Af Amer: >60 mL/min (ref >60)
GFR calc non Af Amer: >60 mL/min (ref >60)
Glucose, Bld: 261 mg/dL — ABNORMAL HIGH (ref 70–99)
Glucose, Bld: 210 mg/dL — ABNORMAL HIGH (ref 70–99)
Potassium: 3.7 mmol/L (ref 3.5–5.1)
Potassium: 3 mmol/L — ABNORMAL LOW (ref 3.5–5.1)
Sodium: 135 mmol/L (ref 135–145)
Sodium: 133 mmol/L — ABNORMAL LOW (ref 135–145)

## 2019-10-18 LAB — URINE CULTURE: Culture: 60000 — AB

## 2019-10-18 MED ORDER — POTASSIUM CHLORIDE 20 MEQ/15ML (10%) PO SOLN
40.0000 meq | Freq: Once | ORAL | Status: AC
  Administered 2019-10-18: 40 meq via ORAL
  Filled 2019-10-18: qty 30

## 2019-10-18 MED ORDER — ALUM & MAG HYDROXIDE-SIMETH 200-200-20 MG/5ML PO SUSP
15.0000 mL | Freq: Four times a day (QID) | ORAL | Status: DC | PRN
  Administered 2019-10-18 (×2): 15 mL via ORAL
  Filled 2019-10-18 (×2): qty 30

## 2019-10-18 MED ORDER — SUCRALFATE 1 GM/10ML PO SUSP
1.0000 g | Freq: Three times a day (TID) | ORAL | Status: DC
  Administered 2019-10-18 – 2019-10-19 (×4): 1 g via ORAL
  Filled 2019-10-18 (×4): qty 10

## 2019-10-18 MED ORDER — PANTOPRAZOLE SODIUM 40 MG PO TBEC
40.0000 mg | DELAYED_RELEASE_TABLET | Freq: Two times a day (BID) | ORAL | Status: DC
  Administered 2019-10-18 – 2019-10-19 (×3): 40 mg via ORAL
  Filled 2019-10-18 (×3): qty 1

## 2019-10-18 MED ORDER — PROMETHAZINE HCL 25 MG/ML IJ SOLN
6.2500 mg | Freq: Once | INTRAMUSCULAR | Status: AC
  Administered 2019-10-18: 6.25 mg via INTRAVENOUS
  Filled 2019-10-18: qty 1

## 2019-10-18 MED ORDER — INSULIN ASPART 100 UNIT/ML ~~LOC~~ SOLN
2.0000 [IU] | Freq: Three times a day (TID) | SUBCUTANEOUS | Status: DC
  Administered 2019-10-18 – 2019-10-19 (×3): 2 [IU] via SUBCUTANEOUS

## 2019-10-18 MED ORDER — INSULIN GLARGINE 100 UNIT/ML ~~LOC~~ SOLN
25.0000 [IU] | Freq: Every day | SUBCUTANEOUS | Status: DC
  Administered 2019-10-18 – 2019-10-19 (×2): 25 [IU] via SUBCUTANEOUS
  Filled 2019-10-18 (×2): qty 0.25

## 2019-10-18 NOTE — Plan of Care (Signed)
Diabetes management.

## 2019-10-18 NOTE — Progress Notes (Signed)
PROGRESS NOTE    Madison Williams  RJJ:[id number] DOB: Aug 11, 1985 DOA: 10/16/2019 PCP: Patient, No Pcp Per  Brief Narrative:  HPI: Madison Williams is a 34 y.o. female with w/ poorly controlled type 1 diabetes mellitus complicated by recurrent episodes of DKA, mild intermittent asthma, chronic tobacco abuse, who is admitted to [facility] on 10/16/2019 with diabetic ketoacidosis after presenting from home to [facility] Department complaining of nausea vomiting. The patient reported good compliance with insulin may have missed one dose only in the last few days ED Course: Labs were notable for blood glucose of 490 with bicarb of less than 7, anion gap of 31  -Admitted with DKA  Assessment & Plan:   Diabetic ketoacidosis -Trigger unclear, patient admits not missing more than a dose of insulin in the last few days -off insulin drip  -Continue Lantus, Premeal NovoLog and sliding scale -Diabetic coordinator and case management consulted -Needs PCP  AKI -Prerenal -Resolved with hydration  Nausea and vomiting, odynophagia -Secondary to DKA, improving -Add Carafate and Protonix -Advance diet  Asthma -Stable, no wheezing  Tobacco use disorder -Counseled  DVT prophylaxis: Lovenox Code Status: Full code Family Communication: No family at bedside discussed extensively with the patient Disposition Plan: Transfer out of stepdown today, home tomorrow if stable  Consultants:  [place] from the grill initiated from the grill  Procedures:   Antimicrobials:    Subjective: -Complains of nausea, complains of odynophagia  Objective: Vitals:   10/18/19 1000 10/18/19 1100 10/18/19 1200 10/18/19 1409  BP: (!) 133/97 125/80 117/81 (!) 139/94  Pulse: 99 88 77 81  Resp: 17 16 19    Temp:   98.1 F (36.7 C) 98.3 F (36.8 C)  TempSrc:   Oral Oral  SpO2: 96% 94% 96% 99%  Weight:      Height:        Intake/Output Summary (Last 24 hours) at 10/18/2019  1425 Last data filed at 10/17/2019 2300 Gross per 24 hour  Intake 76.42 ml  Output 0 ml  Net 76.42 ml   Filed Weights   10/16/19 1841 10/17/19 1122 10/18/19 0500  Weight: 54.4 kg 54.4 kg 53 kg    Examination:  Gen: Pleasant thinly built female sitting up in bed, AAOx3 HEENT: PERRLA, Neck supple, no JVD Lungs: Clear CVS: RRR,No Gallops,Rubs or new Murmurs Abd: soft, Non tender, non distended, BS present Extremities: No edema Skin: no new rashes Psychiatry: Judgement and insight appear normal. Mood & affect appropriate.     Data Reviewed:   CBC: Recent Labs  Lab 10/16/19 1945 10/17/19 0500 10/18/19 0147  WBC 11.2* 12.8* 7.0  NEUTROABS 9.5* 8.8*  --   HGB 16.0* 14.9 14.6  HCT 48.8* 43.8 44.3  MCV 99.6 95.8 96.7  PLT 295 277 228   Basic Metabolic Panel: Recent Labs  Lab 10/16/19 2200 10/17/19 0001 10/17/19 0500 10/17/19 0500 10/17/19 0818 10/17/19 1126 10/17/19 1550 10/18/19 0147 10/18/19 1032  NA  --    < > 137   < > 137 136 138 135 133*  K  --    < > 4.3   < > 4.0 4.1 3.5 3.7 3.0*  CL  --    < > 109   < > 106 106 107 102 99  CO2  --    < > 13*   < > 14* 15* 19* 16* 23  GLUCOSE  --    < > 196*   < > 225* 226* 196* 261*  210*  BUN  --    < > 15   < > 15 14 13 11 11   CREATININE  --    < > 0.88   < > 0.86 0.68 0.62 0.70 0.63  CALCIUM  --    < > 8.9   < > 9.1 8.9 8.6* 8.8* 8.7*  MG 2.4  --  2.0  --   --   --   --   --   --   PHOS  --   --  1.4*  --   --   --   --   --   --    < > = values in this interval not displayed.   GFR: Estimated Creatinine Clearance: 84.5 mL/min (by C-G formula based on SCr of 0.63 mg/dL). Liver Function Tests: Recent Labs  Lab 10/16/19 1945 10/17/19 0500  AST 19 19  ALT 28 24  ALKPHOS 94 78  BILITOT 2.6* 1.9*  PROT 8.6* 7.8  ALBUMIN 4.9 4.3   Recent Labs  Lab 10/16/19 1945  LIPASE 16   No results for input(s): AMMONIA in the last 168 hours. Coagulation Profile: No results for input(s): INR, PROTIME in the last 168  hours. Cardiac Enzymes: No results for input(s): CKTOTAL, CKMB, CKMBINDEX, TROPONINI in the last 168 hours. BNP (last 3 results) No results for input(s): PROBNP in the last 8760 hours. HbA1C: Recent Labs    10/17/19 0500  HGBA1C 10.8*   CBG: Recent Labs  Lab 10/17/19 2048 10/17/19 2148 10/17/19 2300 10/18/19 0822 10/18/19 1207  GLUCAP 190* 175* 160* 321* 122*   Lipid Profile: No results for input(s): CHOL, HDL, LDLCALC, TRIG, CHOLHDL, LDLDIRECT in the last 72 hours. Thyroid Function Tests: No results for input(s): TSH, T4TOTAL, FREET4, T3FREE, THYROIDAB in the last 72 hours. Anemia Panel: No results for input(s): VITAMINB12, FOLATE, FERRITIN, TIBC, IRON, RETICCTPCT in the last 72 hours. Urine analysis:    Component Value Date/Time   COLORURINE STRAW (A) 10/16/2019 1853   APPEARANCEUR CLEAR 10/16/2019 1853   LABSPEC 1.023 10/16/2019 1853   PHURINE 5.0 10/16/2019 1853   GLUCOSEU >=500 (A) 10/16/2019 1853   HGBUR SMALL (A) 10/16/2019 1853   BILIRUBINUR NEGATIVE 10/16/2019 1853   KETONESUR 80 (A) 10/16/2019 1853   PROTEINUR 100 (A) 10/16/2019 1853   NITRITE NEGATIVE 10/16/2019 1853   LEUKOCYTESUR NEGATIVE 10/16/2019 1853   Sepsis Labs: @LABRCNTIP (procalcitonin:4,lacticidven:4)  ) Recent Results (from the past 240 hour(s))  Urine culture     Status: Abnormal   Collection Time: 10/16/19  6:53 PM   Specimen: Urine, Clean Catch  Result Value Ref Range Status   Specimen Description URINE, CLEAN CATCH  Final   Special Requests   Final    NONE Performed at [facility], [address], [place], [place]    Culture (A)  Final    60,000 COLONIES/mL LACTOBACILLUS SPECIES Standardized susceptibility testing for this organism is not available.    Report Status 10/18/2019 FINAL  Final  SARS CORONAVIRUS 2 (TAT 6-24 HRS) Nasopharyngeal Nasopharyngeal Swab     Status: None   Collection Time: 10/16/19  6:55 PM   Specimen: Nasopharyngeal Swab   Result Value Ref Range Status   SARS Coronavirus 2 NEGATIVE NEGATIVE Final    Comment: (NOTE) SARS-CoV-2 target nucleic acids are NOT DETECTED. The SARS-CoV-2 RNA is generally detectable in upper and lower respiratory specimens during the acute phase of infection. Negative results do not preclude SARS-CoV-2 infection, do not rule out co-infections  with other pathogens, and should not be used as the sole basis for treatment or other patient management decisions. Negative results must be combined with clinical observations, patient history, and epidemiological information. The expected result is Negative. Fact Sheet for Patients: SugarRoll.be Fact Sheet for Healthcare Providers: https://www.woods-mathews.com/ This test is not yet approved or cleared by the [place] FDA and  has been authorized for detection and/or diagnosis of SARS-CoV-2 by FDA under an Emergency Use Authorization (EUA). This EUA will remain  in effect (meaning this test can be used) for the duration of the COVID-19 declaration under Section 56 4(b)(1) of the Act, 21 U.S.C. section 360bbb-3(b)(1), unless the authorization is terminated or revoked sooner. Performed at [facility], [place] [address], [place], [place] [address]   MRSA PCR Screening     Status: None   Collection Time: 10/17/19  6:44 PM   Specimen: Nasal Mucosa; Nasopharyngeal  Result Value Ref Range Status   MRSA by PCR NEGATIVE NEGATIVE Final    Comment:        The GeneXpert MRSA Assay (FDA approved for NASAL specimens only), is one component of a comprehensive MRSA colonization surveillance program. It is not intended to diagnose MRSA infection nor to guide or monitor treatment for MRSA infections. Performed at [facility], [place] [address], [place], [place] [address]          Radiology Studies: DG Chest Portable 1 View  Result Date: 10/16/2019 CLINICAL DATA:  34 y.o  female arrived via [facility] from home. C/O N/V and abdominal pain X2 days. Hx. DM EXAM: PORTABLE CHEST 1 VIEW COMPARISON:  03/17/2019 FINDINGS: Cardiac silhouette normal in size. No mediastinal or hilar masses. No evidence of adenopathy. Clear lungs.  No pleural effusion or pneumothorax. Skeletal structures are grossly intact. IMPRESSION: No active disease. Electronically Signed   By: Lajean Manes M.D.   On: 10/16/2019 19:24        Scheduled Meds: . Chlorhexidine Gluconate Cloth  6 each Topical Daily  . enoxaparin (LOVENOX) injection  40 mg Subcutaneous Q24H  . insulin aspart  0-5 Units Subcutaneous QHS  . insulin aspart  0-9 Units Subcutaneous TID WC  . insulin aspart  2 Units Subcutaneous TID WC  . insulin glargine  25 Units Subcutaneous Daily  . pantoprazole  40 mg Oral BID  . potassium chloride  40 mEq Oral Once  . sodium chloride flush  3 mL Intravenous Q12H  . sucralfate  1 g Oral TID WC & HS   Continuous Infusions:    LOS: 2 days   Time spent: 76min  Domenic Polite, MD [facility]  10/18/2019, 2:25 PM

## 2019-10-19 LAB — BASIC METABOLIC PANEL
Anion gap: 9 (ref 5–15)
BUN: 17 mg/dL (ref 6–20)
CO2: 27 mmol/L (ref 22–32)
Calcium: 8.3 mg/dL — ABNORMAL LOW (ref 8.9–10.3)
Chloride: 97 mmol/L — ABNORMAL LOW (ref 98–111)
Creatinine, Ser: 0.61 mg/dL (ref 0.44–1.00)
GFR calc Af Amer: >60 mL/min (ref >60)
GFR calc non Af Amer: >60 mL/min (ref >60)
Glucose, Bld: 368 mg/dL — ABNORMAL HIGH (ref 70–99)
Potassium: 3.7 mmol/L (ref 3.5–5.1)
Sodium: 133 mmol/L — ABNORMAL LOW (ref 135–145)

## 2019-10-19 LAB — CBC
HCT: 38.2 % (ref 36.0–46.0)
Hemoglobin: 12.7 g/dL (ref 12.0–15.0)
MCH: 32.1 pg (ref 26.0–34.0)
MCHC: 33.2 g/dL (ref 30.0–36.0)
MCV: 96.5 fL (ref 80.0–100.0)
Platelets: 168 10*3/uL (ref 150–400)
RBC: 3.96 MIL/uL (ref 3.87–5.11)
RDW: 13.3 % (ref 11.5–15.5)
WBC: 4.9 10*3/uL (ref 4.0–10.5)
nRBC: 0 % (ref 0.0–0.2)

## 2019-10-19 LAB — GLUCOSE, CAPILLARY: Glucose-Capillary: 309 mg/dL — ABNORMAL HIGH (ref 70–99)

## 2019-10-19 MED ORDER — INSULIN GLARGINE 100 UNIT/ML ~~LOC~~ SOLN: 25.0000 [IU] | Freq: Every day | SUBCUTANEOUS | 2 refills | Status: DC

## 2019-10-19 MED ORDER — PROMETHAZINE HCL 12.5 MG PO TABS: 12.5000 mg | ORAL_TABLET | Freq: Four times a day (QID) | ORAL | 0 refills | Status: DC | PRN

## 2019-10-19 MED ORDER — INSULIN ASPART 100 UNIT/ML ~~LOC~~ SOLN: 3.0000 [IU] | Freq: Three times a day (TID) | SUBCUTANEOUS | 0 refills | Status: DC

## 2019-10-19 NOTE — TOC Progression Note (Signed)
Transition of Care [facility]) - Progression Note    Patient Details  Name: Madison Williams MRN: [id number] Date of Birth: January 12, 1986  Transition of Care [facility]) CM/SW Contact  Armanda Heritage, RN Phone Number: 10/19/2019, 10:56 AM  Clinical Narrative:    Patient provided with match letter and list of participating pharmacies.  Patient's current pharmacy is on the list and she plans to go today and fill her prescriptions.  CM also discussed with patient about her upcoming appointment at [facility] and applying for their patient assistance program.    Expected Discharge Plan: Home/Self Care Barriers to Discharge: Continued Medical Work up  Expected Discharge Plan and Services Expected Discharge Plan: Home/Self Care   Discharge Planning Services: CM Consult     Expected Discharge Date: 10/19/19                                     Social Determinants of Health (SDOH) Interventions    Readmission Risk Interventions No flowsheet data found.

## 2019-10-19 NOTE — Discharge Summary (Signed)
Physician Discharge Summary  [facility] GDJ:[id number] DOB: 1986-03-11 DOA: 10/16/2019  PCP: Patient, No Pcp Per  Admit date: 10/16/2019 Discharge date: 10/19/2019  Time spent:  Recommendations for Outpatient Follow-up:  [place] [facility] on 4/13  Discharge Diagnoses:  Principal Problem:   DKA (diabetic ketoacidoses) (HCC) Active Problems:   AKI (acute kidney injury) (HCC)   Leukocytosis   Tobacco use disorder   Type 1 diabetes mellitus with hyperglycemia (HCC)   Mild intermittent asthma without complication   Nausea & vomiting   Abdominal pain   Discharge Condition: Improved  Diet recommendation: Diabetic  Filed Weights   10/17/19 1122 10/18/19 0500 10/19/19 0312  Weight: 54.4 kg 53 kg 54 kg    History of present illness:  Madison Williams a 34 y.o.femalewith w/poorly controlled type 1 diabetes mellitus complicated by recurrent episodes of DKA, mild intermittent asthma, chronic tobacco abuse,who is admitted to [facility] on 3/18/2021with diabetic ketoacidosisafter presenting from home to [facility] Emergency Department complaining of nausea vomiting.The patient reported good compliance with insulin may have missed one dose only in the last few days ED Course:Labs were notable for blood glucose of 490 with bicarb of less than 7, anion gap of 31  -Admitted with DKA  Hospital Course:  Diabetic ketoacidosis -Trigger unclear, patient admits not missing more than a dose of insulin in the last few days -Treated with insulin drip, IV fluids, Glucomander protocol -Subsequently transition to Lantus and premeal NovoLog -Diabetic coordinator and case management consulted -Discharged home on Lantus and NovoLog, case manager has given the patient a match letter for her medications, and also set up follow-up at the [facility] health wellness clinic  AKI -Prerenal -Resolved with hydration  Nausea and vomiting,  odynophagia -Secondary to DKA, improving -diet advanced and tolerating this at DC  Asthma -Stable, no wheezing  Tobacco use disorder -Counseled  Discharge Exam: Vitals:   10/18/19 2047 10/19/19 0612  BP: 124/82 113/79  Pulse: (!) 106 78  Resp: 18 16  Temp: 98.3 F (36.8 C) 98.7 F (37.1 C)  SpO2: 99% 99%    General: AAOx3 Cardiovascular: S1S2/RRR Respiratory: CTAB  Discharge Instructions   Discharge Instructions    Diet Carb Modified   Complete by: As directed    Increase activity slowly   Complete by: As directed      Allergies as of 10/19/2019   No Known Allergies     Medication List    STOP taking these medications   alum & mag hydroxide-simeth 200-200-20 MG/5ML suspension Commonly known as: MAALOX/MYLANTA   famotidine 20 MG tablet Commonly known as: Pepcid   insulin aspart protamine- aspart (70-30) 100 UNIT/ML injection Commonly known as: NOVOLOG MIX 70/30     TAKE these medications   albuterol 108 (90 Base) MCG/ACT inhaler Commonly known as: VENTOLIN HFA Inhale 2 puffs into the lungs every 6 (six) hours as needed for wheezing or shortness of breath.   insulin aspart 100 UNIT/ML injection Commonly known as: novoLOG Inject 3 Units into the skin 3 (three) times daily with meals. What changed: how much to take   insulin glargine 100 UNIT/ML injection Commonly known as: LANTUS Inject 0.25 mLs (25 Units total) into the skin daily.   nicotine 14 mg/24hr patch Commonly known as: NICODERM CQ - dosed in mg/24 hours Place 1 patch (14 mg total) onto the skin daily.   promethazine 12.5 MG tablet Commonly known as: PHENERGAN Take 1 tablet (12.5 mg total) by mouth every  6 (six) hours as needed for nausea or vomiting.      No Known Allergies Follow-up Information    [facility] Follow up on 11/11/2019.   Why: at 2:30pm Contact information: [address] [place] [address] [phone]            The results of significant diagnostics from this hospitalization (including imaging, microbiology, ancillary and laboratory) are listed below for reference.    Significant Diagnostic Studies: DG Chest Portable 1 View  Result Date: 10/16/2019 CLINICAL DATA:  34 y.o female arrived via [facility] from home. C/O N/V and abdominal pain X2 days. Hx. DM EXAM: PORTABLE CHEST 1 VIEW COMPARISON:  03/17/2019 FINDINGS: Cardiac silhouette normal in size. No mediastinal or hilar masses. No evidence of adenopathy. Clear lungs.  No pleural effusion or pneumothorax. Skeletal structures are grossly intact. IMPRESSION: No active disease. Electronically Signed   By: Amie Portland M.D.   On: 10/16/2019 19:24    Microbiology: Recent Results (from the past 240 hour(s))  Urine culture     Status: Abnormal   Collection Time: 10/16/19  6:53 PM   Specimen: Urine, Clean Catch  Result Value Ref Range Status   Specimen Description URINE, CLEAN CATCH  Final   Special Requests   Final    NONE Performed at [facility], [address], [place], [place] [address]    Culture (A)  Final    60,000 COLONIES/mL LACTOBACILLUS SPECIES Standardized susceptibility testing for this organism is not available.    Report Status 10/18/2019 FINAL  Final  SARS CORONAVIRUS 2 (TAT 6-24 HRS) Nasopharyngeal Nasopharyngeal Swab     Status: None   Collection Time: 10/16/19  6:55 PM   Specimen: Nasopharyngeal Swab  Result Value Ref Range Status   SARS Coronavirus 2 NEGATIVE NEGATIVE Final    Comment: (NOTE) SARS-CoV-2 target nucleic acids are NOT DETECTED. The SARS-CoV-2 RNA is generally detectable in upper and lower respiratory specimens during the acute phase of infection. Negative results do not preclude SARS-CoV-2 infection, do not rule out co-infections with other pathogens, and should not be used as the sole basis for treatment or other patient management decisions. Negative results must be combined with  clinical observations, patient history, and epidemiological information. The expected result is Negative. Fact Sheet for Patients: HairSlick.no Fact Sheet for Healthcare Providers: quierodirigir.com This test is not yet approved or cleared by the [place] FDA and  has been authorized for detection and/or diagnosis of SARS-CoV-2 by FDA under an Emergency Use Authorization (EUA). This EUA will remain  in effect (meaning this test can be used) for the duration of the COVID-19 declaration under Section 56 4(b)(1) of the Act, 21 U.S.C. section 360bbb-3(b)(1), unless the authorization is terminated or revoked sooner. Performed at [facility], [address], [place], [place] [address]   MRSA PCR Screening     Status: None   Collection Time: 10/17/19  6:44 PM   Specimen: Nasal Mucosa; Nasopharyngeal  Result Value Ref Range Status   MRSA by PCR NEGATIVE NEGATIVE Final    Comment:        The GeneXpert MRSA Assay (FDA approved for NASAL specimens only), is one component of a comprehensive MRSA colonization surveillance program. It is not intended to diagnose MRSA infection nor to guide or monitor treatment for MRSA infections. Performed at [facility], [address], [place], [place] [address]      Labs: Basic Metabolic Panel: Recent  Labs  Lab 10/16/19 2200 10/17/19 0001 10/17/19 0500 10/17/19 0818 10/17/19 1126 10/17/19 1550 10/18/19 0147 10/18/19 1032 10/19/19 0350  NA  --    < > 137   < > 136 138 135 133* 133*  K  --    < > 4.3   < > 4.1 3.5 3.7 3.0* 3.7  CL  --    < > 109   < > 106 107 102 99 97*  CO2  --    < > 13*   < > 15* 19* 16* 23 27  GLUCOSE  --    < > 196*   < > 226* 196* 261* 210* 368*  BUN  --    < > 15   < > 14 13 11 11 17   CREATININE  --    < > 0.88   < > 0.68 0.62 0.70 0.63 0.61  CALCIUM  --    < > 8.9   < > 8.9 8.6* 8.8* 8.7* 8.3*  MG 2.4  --  2.0  --   --   --   --    --   --   PHOS  --   --  1.4*  --   --   --   --   --   --    < > = values in this interval not displayed.   Liver Function Tests: Recent Labs  Lab 10/16/19 1945 10/17/19 0500  AST 19 19  ALT 28 24  ALKPHOS 94 78  BILITOT 2.6* 1.9*  PROT 8.6* 7.8  ALBUMIN 4.9 4.3   Recent Labs  Lab 10/16/19 1945  LIPASE 16   No results for input(s): AMMONIA in the last 168 hours. CBC: Recent Labs  Lab 10/16/19 1945 10/17/19 0500 10/18/19 0147 10/19/19 0350  WBC 11.2* 12.8* 7.0 4.9  NEUTROABS 9.5* 8.8*  --   --   HGB 16.0* 14.9 14.6 12.7  HCT 48.8* 43.8 44.3 38.2  MCV 99.6 95.8 96.7 96.5  PLT 295 277 228 168   Cardiac Enzymes: No results for input(s): CKTOTAL, CKMB, CKMBINDEX, TROPONINI in the last 168 hours. BNP: BNP (last 3 results) No results for input(s): BNP in the last 8760 hours.  ProBNP (last 3 results) No results for input(s): PROBNP in the last 8760 hours.  CBG: Recent Labs  Lab 10/18/19 0822 10/18/19 1207 10/18/19 1627 10/18/19 2054 10/19/19 0733  GLUCAP 321* 122* 147* 263* 309*    Signed:  Domenic Polite MD.  [facility] 10/19/2019, 10:50 AM

## 2019-10-19 NOTE — Progress Notes (Signed)
Discharge instructions given to pt and all questions were answered.  

## 2019-11-11 ENCOUNTER — Other Ambulatory Visit: Payer: Self-pay

## 2019-11-11 ENCOUNTER — Ambulatory Visit: Payer: Self-pay | Attending: Nurse Practitioner | Admitting: Nurse Practitioner

## 2019-11-19 ENCOUNTER — Other Ambulatory Visit: Payer: Self-pay | Admitting: Nurse Practitioner

## 2019-11-19 ENCOUNTER — Ambulatory Visit: Payer: Self-pay | Attending: Nurse Practitioner | Admitting: Nurse Practitioner

## 2019-11-19 ENCOUNTER — Other Ambulatory Visit: Payer: Self-pay

## 2019-11-19 DIAGNOSIS — E1065 Type 1 diabetes mellitus with hyperglycemia: Secondary | ICD-10-CM

## 2019-11-23 ENCOUNTER — Inpatient Hospital Stay (HOSPITAL_COMMUNITY)
Admission: EM | Admit: 2019-11-23 | Discharge: 2019-11-29 | DRG: 637 | Disposition: A | Discharge status: Home / Self Care | Payer: Medicaid Other | Attending: Internal Medicine | Admitting: Internal Medicine

## 2019-11-23 ENCOUNTER — Encounter (HOSPITAL_COMMUNITY): Payer: Self-pay

## 2019-11-23 ENCOUNTER — Inpatient Hospital Stay (HOSPITAL_COMMUNITY): Payer: Medicaid Other

## 2019-11-23 DIAGNOSIS — I083 Combined rheumatic disorders of mitral, aortic and tricuspid valves: Secondary | ICD-10-CM | POA: Diagnosis present

## 2019-11-23 DIAGNOSIS — E785 Hyperlipidemia, unspecified: Secondary | ICD-10-CM | POA: Diagnosis present

## 2019-11-23 DIAGNOSIS — E876 Hypokalemia: Secondary | ICD-10-CM | POA: Diagnosis not present

## 2019-11-23 DIAGNOSIS — R7989 Other specified abnormal findings of blood chemistry: Secondary | ICD-10-CM

## 2019-11-23 DIAGNOSIS — N179 Acute kidney failure, unspecified: Secondary | ICD-10-CM | POA: Diagnosis present

## 2019-11-23 DIAGNOSIS — Z20822 Contact with and (suspected) exposure to covid-19: Secondary | ICD-10-CM | POA: Diagnosis present

## 2019-11-23 DIAGNOSIS — I25111 Atherosclerotic heart disease of native coronary artery with angina pectoris with documented spasm: Secondary | ICD-10-CM | POA: Diagnosis present

## 2019-11-23 DIAGNOSIS — R000 Tachycardia, unspecified: Secondary | ICD-10-CM | POA: Diagnosis present

## 2019-11-23 DIAGNOSIS — Y840 Cardiac catheterization as the cause of abnormal reaction of the patient, or of later complication, without mention of misadventure at the time of the procedure: Secondary | ICD-10-CM | POA: Diagnosis not present

## 2019-11-23 DIAGNOSIS — I97630 Postprocedural hematoma of a circulatory system organ or structure following a cardiac catheterization: Secondary | ICD-10-CM | POA: Diagnosis not present

## 2019-11-23 DIAGNOSIS — I5021 Acute systolic (congestive) heart failure: Secondary | ICD-10-CM | POA: Diagnosis present

## 2019-11-23 DIAGNOSIS — K683 Retroperitoneal hematoma: Secondary | ICD-10-CM

## 2019-11-23 DIAGNOSIS — I2109 ST elevation (STEMI) myocardial infarction involving other coronary artery of anterior wall: Secondary | ICD-10-CM

## 2019-11-23 DIAGNOSIS — D7589 Other specified diseases of blood and blood-forming organs: Secondary | ICD-10-CM

## 2019-11-23 DIAGNOSIS — I959 Hypotension, unspecified: Secondary | ICD-10-CM | POA: Diagnosis not present

## 2019-11-23 DIAGNOSIS — F17210 Nicotine dependence, cigarettes, uncomplicated: Secondary | ICD-10-CM | POA: Diagnosis present

## 2019-11-23 DIAGNOSIS — E861 Hypovolemia: Secondary | ICD-10-CM | POA: Diagnosis present

## 2019-11-23 DIAGNOSIS — E875 Hyperkalemia: Secondary | ICD-10-CM | POA: Diagnosis present

## 2019-11-23 DIAGNOSIS — D72819 Decreased white blood cell count, unspecified: Secondary | ICD-10-CM | POA: Diagnosis not present

## 2019-11-23 DIAGNOSIS — D62 Acute posthemorrhagic anemia: Secondary | ICD-10-CM | POA: Diagnosis not present

## 2019-11-23 DIAGNOSIS — I110 Hypertensive heart disease with heart failure: Secondary | ICD-10-CM | POA: Diagnosis present

## 2019-11-23 DIAGNOSIS — M069 Rheumatoid arthritis, unspecified: Secondary | ICD-10-CM | POA: Diagnosis present

## 2019-11-23 DIAGNOSIS — R0602 Shortness of breath: Secondary | ICD-10-CM

## 2019-11-23 DIAGNOSIS — R410 Disorientation, unspecified: Secondary | ICD-10-CM | POA: Diagnosis present

## 2019-11-23 DIAGNOSIS — Z79899 Other long term (current) drug therapy: Secondary | ICD-10-CM

## 2019-11-23 DIAGNOSIS — R778 Other specified abnormalities of plasma proteins: Secondary | ICD-10-CM | POA: Diagnosis present

## 2019-11-23 DIAGNOSIS — F419 Anxiety disorder, unspecified: Secondary | ICD-10-CM | POA: Diagnosis present

## 2019-11-23 DIAGNOSIS — E1010 Type 1 diabetes mellitus with ketoacidosis without coma: Secondary | ICD-10-CM | POA: Diagnosis present

## 2019-11-23 DIAGNOSIS — E860 Dehydration: Secondary | ICD-10-CM | POA: Diagnosis present

## 2019-11-23 DIAGNOSIS — Y92230 Patient room in hospital as the place of occurrence of the external cause: Secondary | ICD-10-CM | POA: Diagnosis not present

## 2019-11-23 DIAGNOSIS — N170 Acute kidney failure with tubular necrosis: Secondary | ICD-10-CM

## 2019-11-23 DIAGNOSIS — D696 Thrombocytopenia, unspecified: Secondary | ICD-10-CM | POA: Diagnosis present

## 2019-11-23 DIAGNOSIS — Z9119 Patient's noncompliance with other medical treatment and regimen: Secondary | ICD-10-CM

## 2019-11-23 DIAGNOSIS — J45909 Unspecified asthma, uncomplicated: Secondary | ICD-10-CM | POA: Diagnosis present

## 2019-11-23 DIAGNOSIS — E10649 Type 1 diabetes mellitus with hypoglycemia without coma: Secondary | ICD-10-CM | POA: Diagnosis not present

## 2019-11-23 DIAGNOSIS — Z794 Long term (current) use of insulin: Secondary | ICD-10-CM

## 2019-11-23 DIAGNOSIS — R9431 Abnormal electrocardiogram [ECG] [EKG]: Secondary | ICD-10-CM

## 2019-11-23 DIAGNOSIS — K661 Hemoperitoneum: Secondary | ICD-10-CM

## 2019-11-23 DIAGNOSIS — E1110 Type 2 diabetes mellitus with ketoacidosis without coma: Secondary | ICD-10-CM | POA: Diagnosis present

## 2019-11-23 LAB — CBC
HCT: 46.5 % — ABNORMAL HIGH (ref 36.0–46.0)
Hemoglobin: 13.7 g/dL (ref 12.0–15.0)
MCH: 33.4 pg (ref 26.0–34.0)
MCHC: 29.5 g/dL — ABNORMAL LOW (ref 30.0–36.0)
MCV: 113.4 fL — ABNORMAL HIGH (ref 80.0–100.0)
Platelets: 232 10*3/uL (ref 150–400)
RBC: 4.1 MIL/uL (ref 3.87–5.11)
RDW: 13.8 % (ref 11.5–15.5)
WBC: 14.3 10*3/uL — ABNORMAL HIGH (ref 4.0–10.5)
nRBC: 0 % (ref 0.0–0.2)

## 2019-11-23 LAB — COMPREHENSIVE METABOLIC PANEL
ALT: 44 U/L (ref 0–44)
AST: 34 U/L (ref 15–41)
Albumin: 3.4 g/dL — ABNORMAL LOW (ref 3.5–5.0)
Alkaline Phosphatase: 95 U/L (ref 38–126)
Anion gap: 16 — ABNORMAL HIGH (ref 5–15)
BUN: 35 mg/dL — ABNORMAL HIGH (ref 6–20)
CO2: 13 mmol/L — ABNORMAL LOW (ref 22–32)
Calcium: 8.5 mg/dL — ABNORMAL LOW (ref 8.9–10.3)
Chloride: 113 mmol/L — ABNORMAL HIGH (ref 98–111)
Creatinine, Ser: 1.94 mg/dL — ABNORMAL HIGH (ref 0.44–1.00)
GFR calc Af Amer: 39 mL/min — ABNORMAL LOW (ref >60)
GFR calc non Af Amer: 33 mL/min — ABNORMAL LOW (ref >60)
Glucose, Bld: 506 mg/dL (ref 70–99)
Potassium: 3.5 mmol/L (ref 3.5–5.1)
Sodium: 142 mmol/L (ref 135–145)
Total Bilirubin: 1.7 mg/dL — ABNORMAL HIGH (ref 0.3–1.2)
Total Protein: 6.1 g/dL — ABNORMAL LOW (ref 6.5–8.1)

## 2019-11-23 LAB — BLOOD GAS, ARTERIAL
O2 Saturation: 96.9 %
Patient temperature: 98.6
pCO2 arterial: <19 mmHg — CL (ref 32.0–48.0)
pH, Arterial: 7.197 — CL (ref 7.350–7.450)
pO2, Arterial: 117 mmHg — ABNORMAL HIGH (ref 83.0–108.0)

## 2019-11-23 LAB — CBG MONITORING, ED
Glucose-Capillary: >600 mg/dL (ref 70–99)
Glucose-Capillary: >600 mg/dL (ref 70–99)
Glucose-Capillary: >600 mg/dL (ref 70–99)
Glucose-Capillary: >600 mg/dL (ref 70–99)
Glucose-Capillary: >600 mg/dL (ref 70–99)
Glucose-Capillary: >600 mg/dL (ref 70–99)

## 2019-11-23 LAB — URINALYSIS, ROUTINE W REFLEX MICROSCOPIC
Bilirubin Urine: NEGATIVE
Glucose, UA: >=500 mg/dL — AB
Ketones, ur: 80 mg/dL — AB
Leukocytes,Ua: NEGATIVE
Nitrite: NEGATIVE
Protein, ur: 100 mg/dL — AB
Specific Gravity, Urine: 1.014 (ref 1.005–1.030)
pH: 5 (ref 5.0–8.0)

## 2019-11-23 LAB — I-STAT CHEM 8, ED
BUN: 49 mg/dL — ABNORMAL HIGH (ref 6–20)
Calcium, Ion: 1.11 mmol/L — ABNORMAL LOW (ref 1.15–1.40)
Chloride: 98 mmol/L (ref 98–111)
Creatinine, Ser: 2.1 mg/dL — ABNORMAL HIGH (ref 0.44–1.00)
Glucose, Bld: >700 mg/dL (ref 70–99)
HCT: 46 % (ref 36.0–46.0)
Hemoglobin: 15.6 g/dL — ABNORMAL HIGH (ref 12.0–15.0)
Potassium: 6.7 mmol/L (ref 3.5–5.1)
Sodium: 122 mmol/L — ABNORMAL LOW (ref 135–145)
TCO2: 8 mmol/L — ABNORMAL LOW (ref 22–32)

## 2019-11-23 LAB — RESPIRATORY PANEL BY RT PCR (FLU A&B, COVID)
Influenza A by PCR: NEGATIVE
Influenza B by PCR: NEGATIVE
SARS Coronavirus 2 by RT PCR: NEGATIVE

## 2019-11-23 LAB — BASIC METABOLIC PANEL
BUN: 42 mg/dL — ABNORMAL HIGH (ref 6–20)
CO2: <7 mmol/L — ABNORMAL LOW (ref 22–32)
Calcium: 9.3 mg/dL (ref 8.9–10.3)
Chloride: 85 mmol/L — ABNORMAL LOW (ref 98–111)
Creatinine, Ser: 3.38 mg/dL — ABNORMAL HIGH (ref 0.44–1.00)
GFR calc Af Amer: 20 mL/min — ABNORMAL LOW (ref >60)
GFR calc non Af Amer: 17 mL/min — ABNORMAL LOW (ref >60)
Glucose, Bld: 1392 mg/dL (ref 70–99)
Potassium: 6.5 mmol/L (ref 3.5–5.1)
Sodium: 127 mmol/L — ABNORMAL LOW (ref 135–145)

## 2019-11-23 LAB — BLOOD GAS, VENOUS
Acid-base deficit: 24.5 mmol/L — ABNORMAL HIGH (ref 0.0–2.0)
Bicarbonate: 5.3 mmol/L — ABNORMAL LOW (ref 20.0–28.0)
O2 Saturation: 91.5 %
Patient temperature: 98.6
pCO2, Ven: 19.7 mmHg — CL (ref 44.0–60.0)
pH, Ven: 7.062 — CL (ref 7.250–7.430)
pO2, Ven: 91.7 mmHg — ABNORMAL HIGH (ref 32.0–45.0)

## 2019-11-23 LAB — I-STAT BETA HCG BLOOD, ED (MC, WL, AP ONLY): I-stat hCG, quantitative: <5 m[IU]/mL (ref <5)

## 2019-11-23 LAB — MAGNESIUM: Magnesium: 2.3 mg/dL (ref 1.7–2.4)

## 2019-11-23 LAB — PHOSPHORUS: Phosphorus: 2.1 mg/dL — ABNORMAL LOW (ref 2.5–4.6)

## 2019-11-23 LAB — LIPASE, BLOOD: Lipase: 26 U/L (ref 11–51)

## 2019-11-23 LAB — MRSA PCR SCREENING: MRSA by PCR: NEGATIVE

## 2019-11-23 LAB — GLUCOSE, CAPILLARY
Glucose-Capillary: 587 mg/dL (ref 70–99)
Glucose-Capillary: 444 mg/dL — ABNORMAL HIGH (ref 70–99)
Glucose-Capillary: 481 mg/dL — ABNORMAL HIGH (ref 70–99)
Glucose-Capillary: 422 mg/dL — ABNORMAL HIGH (ref 70–99)
Glucose-Capillary: 490 mg/dL — ABNORMAL HIGH (ref 70–99)
Glucose-Capillary: 393 mg/dL — ABNORMAL HIGH (ref 70–99)
Glucose-Capillary: 359 mg/dL — ABNORMAL HIGH (ref 70–99)
Glucose-Capillary: 239 mg/dL — ABNORMAL HIGH (ref 70–99)

## 2019-11-23 LAB — BETA-HYDROXYBUTYRIC ACID: Beta-Hydroxybutyric Acid: 5.92 mmol/L — ABNORMAL HIGH (ref 0.05–0.27)

## 2019-11-23 LAB — AMYLASE: Amylase: 90 U/L (ref 28–100)

## 2019-11-23 MED ORDER — PROMETHAZINE HCL 25 MG/ML IJ SOLN
12.5000 mg | Freq: Four times a day (QID) | INTRAMUSCULAR | Status: AC | PRN
  Administered 2019-11-24 – 2019-11-26 (×8): 12.5 mg via INTRAVENOUS
  Filled 2019-11-23 (×8): qty 1

## 2019-11-23 MED ORDER — ORAL CARE MOUTH RINSE
15.0000 mL | Freq: Two times a day (BID) | OROMUCOSAL | Status: DC
  Administered 2019-11-23 – 2019-11-27 (×6): 15 mL via OROMUCOSAL

## 2019-11-23 MED ORDER — CHLORHEXIDINE GLUCONATE CLOTH 2 % EX PADS
6.0000 | MEDICATED_PAD | Freq: Every day | CUTANEOUS | Status: DC
  Administered 2019-11-23 – 2019-11-26 (×4): 6 via TOPICAL

## 2019-11-23 MED ORDER — SODIUM CHLORIDE 0.9 % IV BOLUS
1000.0000 mL | Freq: Once | INTRAVENOUS | Status: AC
  Administered 2019-11-23: 1000 mL via INTRAVENOUS

## 2019-11-23 MED ORDER — ACETAMINOPHEN 325 MG PO TABS
650.0000 mg | ORAL_TABLET | Freq: Four times a day (QID) | ORAL | Status: DC | PRN
  Administered 2019-11-24 – 2019-11-25 (×3): 650 mg via ORAL
  Filled 2019-11-23 (×4): qty 2

## 2019-11-23 MED ORDER — INSULIN REGULAR(HUMAN) IN NACL 100-0.9 UT/100ML-% IV SOLN
INTRAVENOUS | Status: DC
  Administered 2019-11-23: 21:00:00 9 [IU]/h via INTRAVENOUS
  Administered 2019-11-23: 13:00:00 8 [IU]/h via INTRAVENOUS
  Filled 2019-11-23 (×2): qty 100

## 2019-11-23 MED ORDER — ONDANSETRON HCL 4 MG/2ML IJ SOLN
4.0000 mg | Freq: Four times a day (QID) | INTRAMUSCULAR | Status: DC | PRN
  Administered 2019-11-23 – 2019-11-29 (×15): 4 mg via INTRAVENOUS
  Filled 2019-11-23 (×15): qty 2

## 2019-11-23 MED ORDER — POLYETHYLENE GLYCOL 3350 17 G PO PACK: 17.0000 g | PACK | Freq: Every day | ORAL | Status: DC | PRN

## 2019-11-23 MED ORDER — ONDANSETRON HCL 4 MG/2ML IJ SOLN: 4.0000 mg | Freq: Four times a day (QID) | INTRAMUSCULAR | Status: DC

## 2019-11-23 MED ORDER — SODIUM CHLORIDE 0.9 % IV BOLUS
20.0000 mL/kg | Freq: Once | INTRAVENOUS | Status: AC
  Administered 2019-11-23: 14:00:00 1088 mL via INTRAVENOUS

## 2019-11-23 MED ORDER — ALBUTEROL SULFATE HFA 108 (90 BASE) MCG/ACT IN AERS
2.0000 | INHALATION_SPRAY | RESPIRATORY_TRACT | Status: DC | PRN
  Filled 2019-11-23: qty 6.7

## 2019-11-23 MED ORDER — SODIUM CHLORIDE 0.9 % IV BOLUS
1000.0000 mL | Freq: Once | INTRAVENOUS | Status: AC
  Administered 2019-11-23: 12:00:00 1000 mL via INTRAVENOUS

## 2019-11-23 MED ORDER — SODIUM CHLORIDE 0.9 % IV BOLUS
1000.0000 mL | Freq: Once | INTRAVENOUS | Status: AC
  Administered 2019-11-23: 13:00:00 1000 mL via INTRAVENOUS

## 2019-11-23 MED ORDER — DOCUSATE SODIUM 100 MG PO CAPS: 100.0000 mg | ORAL_CAPSULE | Freq: Two times a day (BID) | ORAL | Status: DC | PRN

## 2019-11-23 MED ORDER — SODIUM CHLORIDE 0.9 % IV SOLN: INTRAVENOUS | Status: DC

## 2019-11-23 MED ORDER — DEXTROSE-NACL 5-0.45 % IV SOLN: INTRAVENOUS | Status: DC

## 2019-11-23 MED ORDER — HEPARIN SODIUM (PORCINE) 5000 UNIT/ML IJ SOLN
5000.0000 [IU] | Freq: Three times a day (TID) | INTRAMUSCULAR | Status: DC
  Administered 2019-11-23 – 2019-11-24 (×2): 5000 [IU] via SUBCUTANEOUS
  Filled 2019-11-23 (×2): qty 1

## 2019-11-23 MED ORDER — DEXTROSE 50 % IV SOLN: 0.0000 mL | INTRAVENOUS | Status: DC | PRN

## 2019-11-23 MED ORDER — ACETAMINOPHEN 650 MG RE SUPP
650.0000 mg | RECTAL | Status: DC | PRN
  Administered 2019-11-23: 650 mg via RECTAL
  Filled 2019-11-23: qty 1

## 2019-11-23 MED ORDER — STERILE WATER FOR INJECTION IV SOLN
INTRAVENOUS | Status: DC
  Filled 2019-11-23: qty 850

## 2019-11-23 MED ORDER — PANTOPRAZOLE SODIUM 40 MG IV SOLR
40.0000 mg | Freq: Every day | INTRAVENOUS | Status: DC
  Administered 2019-11-23 – 2019-11-25 (×3): 40 mg via INTRAVENOUS
  Filled 2019-11-23 (×3): qty 40

## 2019-11-23 MED ORDER — SODIUM CHLORIDE 0.9 % IV SOLN
2.0000 g | INTRAVENOUS | Status: DC
  Administered 2019-11-23 – 2019-11-25 (×3): 2 g via INTRAVENOUS
  Filled 2019-11-23: qty 2
  Filled 2019-11-23: qty 20
  Filled 2019-11-23: qty 2

## 2019-11-23 NOTE — Progress Notes (Signed)
eLink Physician-Brief Progress Note Patient Name: Minna Dumire DOB: Jan 23, 1986 MRN: [id number]   Date of Service  11/23/2019  HPI/Events of Note  Pt with DKA and  nausea not relieved by Zofran  eICU Interventions  Phenergan 12.5 mg iv Q 6 hours PRN nausea not relieved by Zofran ordered.        Thomasene Lot Lyell Clugston 11/23/2019, 11:41 PM

## 2019-11-23 NOTE — ED Notes (Signed)
Date and time results received: 11/23/19 12:34 PM  (use smartphrase ".now" to insert current time)  Test: ph 7.062; PCO2 19.7 Critical Value  Name of Provider Notified: madina RN   Orders Received? Or Actions Taken?:

## 2019-11-23 NOTE — ED Notes (Signed)
Pt given few ice chips

## 2019-11-23 NOTE — ED Notes (Signed)
I gave critical I stat Chem 8 results to MD Rubin Payor

## 2019-11-23 NOTE — Progress Notes (Addendum)
Sugars did start to improve  Continue with Endo tool Discontinue bicarb Labs from this p.m. still pending

## 2019-11-23 NOTE — ED Notes (Signed)
ED TO INPATIENT HANDOFF REPORT  ED Nurse Name and Phone #: [phone]  S Name/Age/Gender Madison Williams 34 y.o. female Room/Bed: WA17/WA17  Code Status   Code Status: Full Code  Home/SNF/Other Home Patient oriented to: self, place and time Is this baseline? Yes   Triage Complete: Triage complete  Chief Complaint DKA (diabetic ketoacidoses) (HCC) [E11.10]  Triage Note Patient presented to the ed with complain of nausea and vomiting. Hyperglycemia and weakness. Blood sugar 480 per ems. Patient was given 500 ml saline bolus and zofran 4 mg.    Allergies No Known Allergies  Level of Care/Admitting Diagnosis ED Disposition    ED Disposition Condition Comment   Admit  [facility]ospital Area: [facility] [[id number]]  Level of Care: ICU [6]  May admit patient to [facility] or [facility] if equivalent level of care is available:: Yes  Covid Evaluation: Asymptomatic Screening Protocol (No Symptoms)  Diagnosis: DKA (diabetic ketoacidoses) [facility]) [[id number]]  Admitting Physician: Tomma Lightning [[id number]]  Attending Physician: Tomma Lightning [[id number]]  Estimated length of stay: 3 - 4 days  Certification:: I certify this patient will need inpatient services for at least 2 midnights       B Medical/Surgery History Past Medical History:  Diagnosis Date  . Asthma   . Diabetes mellitus without complication (HCC)   . Rheumatoid arthritis [facility])    Past Surgical History:  Procedure Laterality Date  . LAPAROSCOPIC OVARIAN       A IV Location/Drains/Wounds Patient Lines/Drains/Airways Status   Active Line/Drains/Airways    Name:   Placement date:   Placement time:   Site:   Days:   Peripheral IV 11/23/19 Anterior;Right Wrist   11/23/19    -    Wrist   less than 1   Peripheral IV 11/23/19 Left Hand   11/23/19    1152    Hand   less than 1          Intake/Output Last 24 hours No intake or output data in the 24 hours ending 11/23/19  1636  Labs/Imaging Results for orders placed or performed during the hospital encounter of 11/23/19 (from the past 48 hour(s))  CBG monitoring, ED     Status: Abnormal   Collection Time: 11/23/19 11:28 AM  Result Value Ref Range   Glucose-Capillary >600 (HH) 70 - 99 mg/dL    Comment: Glucose reference range applies only to samples taken after fasting for at least 8 hours.   Comment 1 Notify RN    Comment 2 Document in Chart   Basic metabolic panel     Status: Abnormal   Collection Time: 11/23/19 11:54 AM  Result Value Ref Range   Sodium 127 (L) 135 - 145 mmol/L   Potassium 6.5 (HH) 3.5 - 5.1 mmol/L    Comment: POST-ULTRACENTRIFUGATION NO VISIBLE HEMOLYSIS CRITICAL RESULT CALLED TO, READ BACK BY AND VERIFIED WITH: RN Judie Bonus AT 1537 11/23/19 BY L BENFIELD    Chloride 85 (L) 98 - 111 mmol/L   CO2 <7 (L) 22 - 32 mmol/L   Glucose, Bld 1,392 (HH) 70 - 99 mg/dL    Comment: RESULTS CONFIRMED BY MANUAL DILUTION CRITICAL RESULT CALLED TO, READ BACK BY AND VERIFIED WITH: RN M Satine Hausner AT 1537 11/23/19 BY L BENFIELD    BUN 42 (H) 6 - 20 mg/dL   Creatinine, Ser 7.37 (H) 0.44 - 1.00 mg/dL   Calcium 9.3 8.9 - 10.6 mg/dL   GFR calc non Af Amer 17 (L) >  60 mL/min   GFR calc Af Amer 20 (L) >60 mL/min   Anion gap NOT CALCULATED 5 - 15    Comment: Performed at [facility] [address] [place], [place] [address]  CBC     Status: Abnormal   Collection Time: 11/23/19 11:54 AM  Result Value Ref Range   WBC 14.3 (H) 4.0 - 10.5 K/uL   RBC 4.10 3.87 - 5.11 MIL/uL   Hemoglobin 13.7 12.0 - 15.0 g/dL   HCT 46.5 (H) 36.0 - 46.0 %   MCV 113.4 (H) 80.0 - 100.0 fL   MCH 33.4 26.0 - 34.0 pg   MCHC 29.5 (L) 30.0 - 36.0 g/dL   RDW 13.8 11.5 - 15.5 %   Platelets 232 150 - 400 K/uL   nRBC 0.0 0.0 - 0.2 %    Comment: Performed at [facility], [place] [address] [place], [place] [address]  Blood gas, venous (at [facility] and [facility], not at [facility])     Status: Abnormal   Collection Time: 11/23/19  11:58 AM  Result Value Ref Range   pH, Ven 7.062 (LL) 7.250 - 7.430    Comment: CRITICAL RESULT CALLED TO, READ BACK BY AND VERIFIED WITH: HALL,C RN @1234  ON 11/23/2019 JACKSON,K    pCO2, Ven 19.7 (LL) 44.0 - 60.0 mmHg    Comment: CRITICAL RESULT CALLED TO, READ BACK BY AND VERIFIED WITH: HALL,C RN @1234  ON 11/23/2019 JACKSON,K    pO2, Ven 91.7 (H) 32.0 - 45.0 mmHg   Bicarbonate 5.3 (L) 20.0 - 28.0 mmol/L   Acid-base deficit 24.5 (H) 0.0 - 2.0 mmol/L   O2 Saturation 91.5 %   Patient temperature 98.6     Comment: Performed at [facility], [place] [address] [place], [place] [address]  I-Stat beta hCG blood, ED     Status: None   Collection Time: 11/23/19 12:04 PM  Result Value Ref Range   I-stat hCG, quantitative <5.0 <5 mIU/mL   Comment 3            Comment:   GEST. AGE      CONC.  (mIU/mL)   <=1 WEEK        5 - 50     2 WEEKS       50 - 500     3 WEEKS       100 - 10,000     4 WEEKS     1,000 - 30,000        FEMALE AND NON-PREGNANT FEMALE:     LESS THAN 5 mIU/mL   I-stat chem 8, ED (not at [facility] or [facility])     Status: Abnormal   Collection Time: 11/23/19 12:06 PM  Result Value Ref Range   Sodium 122 (L) 135 - 145 mmol/L   Potassium 6.7 (HH) 3.5 - 5.1 mmol/L   Chloride 98 98 - 111 mmol/L   BUN 49 (H) 6 - 20 mg/dL   Creatinine, Ser 2.10 (H) 0.44 - 1.00 mg/dL   Glucose, Bld >700 (HH) 70 - 99 mg/dL    Comment: Glucose reference range applies only to samples taken after fasting for at least 8 hours.   Calcium, Ion 1.11 (L) 1.15 - 1.40 mmol/L   TCO2 8 (L) 22 - 32 mmol/L   Hemoglobin 15.6 (H) 12.0 - 15.0 g/dL   HCT 46.0 36.0 - 46.0 %  CBG monitoring, ED     Status: Abnormal   Collection Time: 11/23/19 12:57 PM  Result Value Ref Range   Glucose-Capillary >600 (HH) 70 - 99 mg/dL    Comment: Glucose reference range applies only to samples taken after fasting for at least 8 hours.  CBG monitoring, ED     Status: Abnormal   Collection Time: 11/23/19  2:10 PM  Result  Value Ref Range   Glucose-Capillary >600 (HH) 70 - 99 mg/dL    Comment: Glucose reference range applies only to samples taken after fasting for at least 8 hours.  CBG monitoring, ED     Status: Abnormal   Collection Time: 11/23/19  3:07 PM  Result Value Ref Range   Glucose-Capillary >600 (HH) 70 - 99 mg/dL    Comment: Glucose reference range applies only to samples taken after fasting for at least 8 hours.  CBG monitoring, ED     Status: Abnormal   Collection Time: 11/23/19  3:49 PM  Result Value Ref Range   Glucose-Capillary >600 (HH) 70 - 99 mg/dL    Comment: Glucose reference range applies only to samples taken after fasting for at least 8 hours.   No results found.  Pending Labs Unresulted Labs (From admission, onward)    Start     Ordered   11/23/19 1609  Blood gas, arterial  Once,   R     11/23/19 1608   11/23/19 1600  Comprehensive metabolic panel  Once,   STAT     11/23/19 1402   11/23/19 1600  Amylase  Once,   STAT     11/23/19 1402   11/23/19 1600  Lipase, blood  Once,   STAT     11/23/19 1402   11/23/19 1600  Magnesium  Once,   STAT     11/23/19 1402   11/23/19 1600  Phosphorus  Once,   STAT     11/23/19 1402   11/23/19 1402  Lactic acid, plasma  STAT Now then every 3 hours,   R (with STAT occurrences)     11/23/19 1402   11/23/19 1401  Culture, blood (routine x 2)  BLOOD CULTURE X 2,   R (with STAT occurrences)     11/23/19 1402   11/23/19 1357  CBC  (heparin)  Once,   STAT    Comments: Baseline for heparin therapy IF NOT ALREADY DRAWN.  Notify MD if PLT < 100 K.    11/23/19 1402   11/23/19 1357  Creatinine, serum  (heparin)  Once,   STAT    Comments: Baseline for heparin therapy IF NOT ALREADY DRAWN.    11/23/19 1402   11/23/19 1218  Respiratory Panel by RT PCR (Flu A&B, Covid) - Nasopharyngeal Swab  (Tier 2 Respiratory Panel by RT PCR (Flu A&B, Covid) (TAT 2 hrs))  Once,   STAT    Question Answer Comment  Is this test for diagnosis or screening Screening    Symptomatic for COVID-19 as defined by CDC No   Hospitalized for COVID-19 No   Admitted to ICU for COVID-19 No   Previously tested for COVID-19 Yes   Resident in a congregate (group) care setting No   Employed in healthcare setting No   Pregnant No   Has patient completed COVID vaccination(s) (2 doses of Pfizer/Moderna 1 dose of [employer]) Unknown      11/23/19 1218   11/23/19 1208  Beta-hydroxybutyric acid  (Diabetes Ketoacidosis (DKA))  Now then every 8 hours,   STAT     11/23/19 1220   11/23/19 1152  Urinalysis, Routine w  reflex microscopic  Once,   STAT     11/23/19 1151          Vitals/Pain Today's Vitals   11/23/19 1415 11/23/19 1430 11/23/19 1445 11/23/19 1500  BP: (!) 129/50 125/81 122/90 133/81  Pulse:  (!) 118 (!) 121 (!) 115  Resp: (!) 38 (!) 36 (!) 43 (!) 25  Temp:      TempSrc:      SpO2:  100% 100% 100%  Weight:      Height:        Isolation Precautions No active isolations  Medications Medications  insulin regular, human (MYXREDLIN) 100 units/ 100 mL infusion (14 Units/hr Intravenous Rate/Dose Change 11/23/19 1600)  0.9 %  sodium chloride infusion ( Intravenous Hold 11/23/19 1415)  dextrose 5 %-0.45 % sodium chloride infusion ( Intravenous Hold 11/23/19 1416)  dextrose 50 % solution 0-50 mL (has no administration in time range)  docusate sodium (COLACE) capsule 100 mg (has no administration in time range)  polyethylene glycol (MIRALAX / GLYCOLAX) packet 17 g (has no administration in time range)  heparin injection 5,000 Units (has no administration in time range)  sodium chloride 0.9 % bolus 1,000 mL (has no administration in time range)  sodium chloride 0.9 % bolus 1,000 mL (0 mLs Intravenous Stopped 11/23/19 1416)  sodium chloride 0.9 % bolus 1,000 mL (0 mLs Intravenous Stopped 11/23/19 1334)  sodium chloride 0.9 % bolus 1,000 mL (0 mLs Intravenous Stopped 11/23/19 1334)  sodium chloride 0.9 % bolus 1,088 mL (0 mLs Intravenous Stopped 11/23/19 1604)     Mobility walks Moderate fall risk   Focused Assessments .   R Recommendations: See Admitting Provider Note  Report given to:   Additional Notes: n/a

## 2019-11-23 NOTE — H&P (Addendum)
NAME:  Madison Williams, MRN:  [id number], DOB:  1986-04-11, LOS: 0 ADMISSION DATE:  11/23/2019, CONSULTATION DATE:  11/23/2018 REFERRING MD:  Dr Dahlia Client, CHIEF COMPLAINT:  DKA   Brief History   Presented to the hospital with confusion  History of present illness   Feeling poorly for couple of days Not able to keep anything down Some abdominal discomfort Denies dysuria Denies contact with anybody with a febrile illness Has been admitted recently for DKA  Past Medical History   Past Medical History:  Diagnosis Date  . Asthma   . Diabetes mellitus without complication (HCC)   . Rheumatoid arthritis (HCC)     Significant Hospital Events   Markedly elevated blood sugars  Consults:    Procedures:  None  Significant Diagnostic Tests:  ABG with severe acidosis, hyperkalemia  Micro Data:  Blood cultures 4/25  Antimicrobials:  Rocephin 4/25>>  Interim history/subjective:  Activated EMS secondary to worsening status  Objective   Blood pressure 126/87, pulse 99, temperature 97.7 F (36.5 C), temperature source Rectal, resp. rate (!) 32, height 5\' 7"  (1.702 m), weight 54.4 kg, last menstrual period 03/16/2018, SpO2 100 %.       No intake or output data in the 24 hours ending 11/23/19 1720 Filed Weights   11/23/19 1344  Weight: 54.4 kg    Examination: General: Young lady does not appear to be in distress HENT: Dry oral mucosa Lungs: Decreased air movement bilaterally, no added sounds Cardiovascular: S1-S2 appreciated Abdomen: Bowel sounds appreciated Extremities: No clubbing, no edema Neuro: Awake, responsive, follows commands GU: Decreased output  Chest x-ray with no acute disease Resolved Hospital Problem list     Assessment & Plan:  DKA -Continue with Endo tool -Insulin rate increased -Aggressive fluid resuscitation  -Persistence acidemia despite aggressive fluid resuscitation -Short-term bicarb drip  Acute kidney injury -Fluid resuscitation  -Maintain renal perfusion -Avoid nephrotoxic's  Fever -Follow Covid testing -Follow cultures -Empiric Rocephin  Hyperkalemia -Continue to trend -Should improve with correction of DKA  Asthma -Does not appear to be exacerbated at present -Albuterol as needed  Best practice:  Diet: May have ice chips Pain/Anxiety/Delirium protocol (if indicated): Not applicable VAP protocol (if indicated): Not applicable DVT prophylaxis: Heparin GI prophylaxis: Protonix Glucose control: Endotool Mobility: Bedrest Code Status: Full code Family Communication: Patient informed about ongoing events Disposition: ICU  Labs   CBC: Recent Labs  Lab 11/23/19 1154 11/23/19 1206  WBC 14.3*  --   HGB 13.7 15.6*  HCT 46.5* 46.0  MCV 113.4*  --   PLT 232  --     Basic Metabolic Panel: Recent Labs  Lab 11/23/19 1154 11/23/19 1206  NA 127* 122*  K 6.5* 6.7*  CL 85* 98  CO2 <7*  --   GLUCOSE 1,392* >700*  BUN 42* 49*  CREATININE 3.38* 2.10*  CALCIUM 9.3  --    GFR: Estimated Creatinine Clearance: 33 mL/min (A) (by C-G formula based on SCr of 2.1 mg/dL (H)). Recent Labs  Lab 11/23/19 1154  WBC 14.3*    Liver Function Tests: No results for input(s): AST, ALT, ALKPHOS, BILITOT, PROT, ALBUMIN in the last 168 hours. No results for input(s): LIPASE, AMYLASE in the last 168 hours. No results for input(s): AMMONIA in the last 168 hours.  ABG    Component Value Date/Time   PHART 7.197 (LL) 11/23/2019 1630   PCO2ART <19.0 (LL) 11/23/2019 1630   PO2ART 117 (H) 11/23/2019 1630   HCO3 NOT CALCULATED 11/23/2019 1630  TCO2 8 (L) 11/23/2019 1206   ACIDBASEDEF NOT CALCULATED 11/23/2019 1630   O2SAT 96.9 11/23/2019 1630     Coagulation Profile: No results for input(s): INR, PROTIME in the last 168 hours.  Cardiac Enzymes: No results for input(s): CKTOTAL, CKMB, CKMBINDEX, TROPONINI in the last 168 hours.  HbA1C: Hgb A1c MFr Bld  Date/Time Value Ref Range Status  10/17/2019 05:00  AM 10.8 (H) 4.8 - 5.6 % Final    Comment:    (NOTE) Pre diabetes:          5.7%-6.4% Diabetes:              >6.4% Glycemic control for   <7.0% adults with diabetes   04/02/2019 11:55 PM 11.1 (H) 4.8 - 5.6 % Final    Comment:    (NOTE) Pre diabetes:          5.7%-6.4% Diabetes:              >6.4% Glycemic control for   <7.0% adults with diabetes     CBG: Recent Labs  Lab 11/23/19 1257 11/23/19 1410 11/23/19 1507 11/23/19 1549 11/23/19 1650  GLUCAP >600* >600* >600* >600* >600*    Review of Systems:   Active smoker She does use some illicit over-the-counter' opiates'  Past Medical History  She,  has a past medical history of Asthma, Diabetes mellitus without complication ([place]), and Rheumatoid arthritis ([place]).   Surgical History    Past Surgical History:  Procedure Laterality Date  . LAPAROSCOPIC OVARIAN       Social History   reports that she has been smoking. She has been smoking about 1.00 pack per day. She has never used smokeless tobacco. She reports current alcohol use. She reports that she does not use drugs.   Family History   Her family history is not on file.   Allergies No Known Allergies   Home Medications  Prior to Admission medications   Medication Sig Start Date End Date Taking? Authorizing Provider  albuterol (PROVENTIL HFA;VENTOLIN HFA) 108 (90 Base) MCG/ACT inhaler Inhale 2 puffs into the lungs every 6 (six) hours as needed for wheezing or shortness of breath.    [provider]  insulin aspart (NOVOLOG) 100 UNIT/ML injection Inject 3 Units into the skin 3 (three) times daily with meals. 10/19/19   Domenic Polite, MD  insulin glargine (LANTUS) 100 UNIT/ML injection Inject 0.25 mLs (25 Units total) into the skin daily. 10/19/19   Domenic Polite, MD  nicotine (NICODERM CQ - DOSED IN MG/24 HOURS) 14 mg/24hr patch Place 1 patch (14 mg total) onto the skin daily. Patient not taking: Reported on 01/30/2019 04/01/18   Lavina Hamman, MD   promethazine (PHENERGAN) 12.5 MG tablet Take 1 tablet (12.5 mg total) by mouth every 6 (six) hours as needed for nausea or vomiting. 10/19/19   Domenic Polite, MD    The patient is critically ill with multiple organ systems failure and requires high complexity decision making for assessment and support, frequent evaluation and titration of therapies, application of advanced monitoring technologies and extensive interpretation of multiple databases. Critical Care Time devoted to patient care services described in this note independent of APP/resident time (if applicable)  is 40 minutes.   Sherrilyn Rist MD [facility] Personal pager: [phone] If unanswered, please page CCM On-call: [phone]

## 2019-11-23 NOTE — ED Notes (Signed)
2 L/M of oxygen placed via nasal cannula for support.

## 2019-11-23 NOTE — ED Notes (Signed)
Urine culture sent down to lab with urinalysis. 

## 2019-11-23 NOTE — ED Notes (Signed)
BMP is Lipemia so lab is sent to [facility] to be done there, should be completed by 4pm

## 2019-11-23 NOTE — ED Provider Notes (Signed)
[facility]-EMERGENCY DEPT Provider Note   CSN: [id number] Arrival date & time: 11/23/19  1109     History No chief complaint on file.   Madison Williams is a 34 y.o. female.  Patient presents to the ED with CC of hyperglycemia and confusion.  Level 5 caveat applies secondary to confusion.  She has a history of multiple prior admissions for DKA.  Prior reports of noncompliance.  The history is provided by medical records. No language interpreter was used.       Past Medical History:  Diagnosis Date  . Asthma   . Diabetes mellitus without complication (HCC)   . Rheumatoid arthritis [facility])     Patient Active Problem List   Diagnosis Date Noted  . Nausea & vomiting 10/16/2019  . Abdominal pain 10/16/2019  . Chest tightness   . Mild intermittent asthma without complication   . Acute vomiting 01/31/2019  . Type 1 diabetes mellitus with hyperglycemia (HCC) 01/31/2019  . Uninsured 01/31/2019  . DKA (diabetic ketoacidoses) (HCC) 03/30/2018  . AKI (acute kidney injury) (HCC) 03/30/2018  . Leukocytosis 03/30/2018  . Dehydration 03/30/2018  . Tobacco use disorder 03/30/2018    Past Surgical History:  Procedure Laterality Date  . LAPAROSCOPIC OVARIAN       OB History   No obstetric history on file.     No family history on file.  Social History   Tobacco Use  . Smoking status: Current Every Day Smoker    Packs/day: 1.00  . Smokeless tobacco: Never Used  Substance Use Topics  . Alcohol use: Yes    Comment: rarely  . Drug use: Never    Home Medications Prior to Admission medications   Medication Sig Start Date End Date Taking? Authorizing Provider  albuterol (PROVENTIL HFA;VENTOLIN HFA) 108 (90 Base) MCG/ACT inhaler Inhale 2 puffs into the lungs every 6 (six) hours as needed for wheezing or shortness of breath.    [provider]  insulin aspart (NOVOLOG) 100 UNIT/ML injection Inject 3 Units into the skin 3 (three) times daily  with meals. 10/19/19   Zannie Cove, MD  insulin glargine (LANTUS) 100 UNIT/ML injection Inject 0.25 mLs (25 Units total) into the skin daily. 10/19/19   Zannie Cove, MD  nicotine (NICODERM CQ - DOSED IN MG/24 HOURS) 14 mg/24hr patch Place 1 patch (14 mg total) onto the skin daily. Patient not taking: Reported on 01/30/2019 04/01/18   Rolly Salter, MD  promethazine (PHENERGAN) 12.5 MG tablet Take 1 tablet (12.5 mg total) by mouth every 6 (six) hours as needed for nausea or vomiting. 10/19/19   Zannie Cove, MD    Allergies    Patient has no known allergies.  Review of Systems   Review of Systems  Unable to perform ROS: Mental status change    Physical Exam Updated Vital Signs BP 124/80 (BP Location: Left Arm)   Pulse (!) 128   Temp 97.7 F (36.5 C) (Rectal)   Resp (!) 24   LMP 03/16/2018   SpO2 100%   Physical Exam Vitals and nursing note reviewed.  Constitutional:      General: She is not in acute distress.    Appearance: She is well-developed.  HENT:     Head: Normocephalic and atraumatic.     Mouth/Throat:     Mouth: Mucous membranes are dry.  Eyes:     Conjunctiva/sclera: Conjunctivae normal.  Cardiovascular:     Rate and Rhythm: Regular rhythm. Tachycardia present.  Heart sounds: No murmur.  Pulmonary:     Effort: Respiratory distress present.     Breath sounds: Normal breath sounds.     Comments: tachypneic to the high 30s Lungs are clear Abdominal:     Palpations: Abdomen is soft.     Tenderness: There is no abdominal tenderness.  Musculoskeletal:        General: No deformity or signs of injury.     Cervical back: Neck supple.  Skin:    General: Skin is warm and dry.  Neurological:     Mental Status: She is alert.     Comments: Confused. Alert to person and place, but unable to answer most questions appropriately  Psychiatric:     Comments: Unable to assess     ED Results / Procedures / Treatments   Labs (all labs ordered are listed, but  only abnormal results are displayed) Labs Reviewed  CBC - Abnormal; Notable for the following components:      Result Value   WBC 14.3 (*)    HCT 46.5 (*)    MCV 113.4 (*)    MCHC 29.5 (*)    All other components within normal limits  BLOOD GAS, VENOUS - Abnormal; Notable for the following components:   pH, Ven 7.062 (*)    pCO2, Ven 19.7 (*)    pO2, Ven 91.7 (*)    Bicarbonate 5.3 (*)    Acid-base deficit 24.5 (*)    All other components within normal limits  CBG MONITORING, ED - Abnormal; Notable for the following components:   Glucose-Capillary >600 (*)    All other components within normal limits  CBG MONITORING, ED - Abnormal; Notable for the following components:   Glucose-Capillary >600 (*)    All other components within normal limits  I-STAT CHEM 8, ED - Abnormal; Notable for the following components:   Sodium 122 (*)    Potassium 6.7 (*)    BUN 49 (*)    Creatinine, Ser 2.10 (*)    Glucose, Bld >700 (*)    Calcium, Ion 1.11 (*)    TCO2 8 (*)    Hemoglobin 15.6 (*)    All other components within normal limits  RESPIRATORY PANEL BY RT PCR (FLU A&B, COVID)  BASIC METABOLIC PANEL  URINALYSIS, ROUTINE W REFLEX MICROSCOPIC  BETA-HYDROXYBUTYRIC ACID  BETA-HYDROXYBUTYRIC ACID  I-STAT BETA HCG BLOOD, ED (MC, WL, AP ONLY)  I-STAT VENOUS BLOOD GAS, ED  CBG MONITORING, ED    EKG EKG Interpretation  Date/Time:  Sunday November 23 2019 11:47:27 EDT Ventricular Rate:  130 PR Interval:    QRS Duration: 105 QT Interval:  324 QTC Calculation: 477 R Axis:   80 Text Interpretation: Sinus tachycardia ST elev, probable normal early repol pattern Borderline prolonged QT interval Tachycardia with peaked T waves Confirmed by Davonna Belling [phone]) on 11/23/2019 11:51:15 AM   Radiology No results found.  Procedures .Critical Care Performed by: Montine Circle, PA-C Authorized by: Montine Circle, PA-C   Critical care provider statement:    Critical care time (minutes):   50   Critical care was necessary to treat or prevent imminent or life-threatening deterioration of the following conditions:  Metabolic crisis   Critical care was time spent personally by me on the following activities:  Discussions with consultants, evaluation of patient's response to treatment, examination of patient, ordering and performing treatments and interventions, ordering and review of laboratory studies, ordering and review of radiographic studies, pulse oximetry, re-evaluation of patient's condition, obtaining  history from patient or surrogate and review of old charts     Medications Ordered in ED Medications  sodium chloride 0.9 % bolus 1,000 mL (has no administration in time range)  insulin regular, human (MYXREDLIN) 100 units/ 100 mL infusion (8 Units/hr Intravenous New Bag/Given 11/23/19 1304)  0.9 %  sodium chloride infusion (has no administration in time range)  dextrose 5 %-0.45 % sodium chloride infusion (has no administration in time range)  dextrose 50 % solution 0-50 mL (has no administration in time range)  sodium chloride 0.9 % bolus 20 mL/kg (has no administration in time range)  sodium chloride 0.9 % bolus 1,000 mL (1,000 mLs Intravenous New Bag/Given 11/23/19 1211)  sodium chloride 0.9 % bolus 1,000 mL (1,000 mLs Intravenous New Bag/Given 11/23/19 1211)    ED Course  I have reviewed the triage vital signs and the nursing notes.  Pertinent labs & imaging results that were available during my care of the patient were reviewed by me and considered in my medical decision making (see chart for details).    MDM Rules/Calculators/A&P                      This patient complains of hyperglycemia, this involves an extensive number of treatment options, and is a complaint that carries with it a high risk of complications and morbidity.  The differential diagnosis includes DKA, HHS, uncomplicated hyperglycemia, metabolic crisis, dehydration  I ordered, reviewed, and  interpreted labs, which included CBC, BMP, VBG, CBG, HCG which are notable for WBC of 14.3, electrolytes are notable for significant hyponatremia of 122, moderately severe hyperkalemia of 6.7, Cr of 2.1, glucose >700, venous PH is 7.06. I ordered medication fluids and insulin for rehydration and DKA Previous records obtained and reviewed multiple prior admissions for DKA. I consulted Dr. Wynona Neat, Intensivist and discussed pertinent lab and imaging findings.  Appreciate Dr. Wynona Neat for admitting the patient.  Critical interventions: multiple fluid boluses, insulin infusion, frequent rechecks and ongoing cardiac monitoring.  After the interventions stated above, I reevaluated the patient and found still confused, but with vitals slowly improving.  Feel that ICU admission is most appropriate for patient.  Final Clinical Impression(s) / ED Diagnoses Final diagnoses:  Diabetic ketoacidosis without coma associated with type 1 diabetes mellitus (HCC)  Confusion  Hyperkalemia    Rx / DC Orders ED Discharge Orders    None       Roxy Horseman, PA-C 11/23/19 1456    Benjiman Core, MD 11/24/19 1458

## 2019-11-23 NOTE — ED Triage Notes (Signed)
Patient presented to the ed with complain of nausea and vomiting. Hyperglycemia and weakness. Blood sugar 480 per ems. Patient was given 500 ml saline bolus and zofran 4 mg.

## 2019-11-24 ENCOUNTER — Inpatient Hospital Stay (HOSPITAL_COMMUNITY)
Admission: EM | Disposition: A | Discharge status: Home / Self Care | Payer: Self-pay | Source: Home / Self Care | Attending: Internal Medicine

## 2019-11-24 ENCOUNTER — Inpatient Hospital Stay (HOSPITAL_COMMUNITY): Payer: Medicaid Other

## 2019-11-24 ENCOUNTER — Encounter (HOSPITAL_COMMUNITY)
Admission: EM | Disposition: A | Discharge status: Home / Self Care | Payer: Self-pay | Source: Home / Self Care | Attending: Internal Medicine

## 2019-11-24 ENCOUNTER — Other Ambulatory Visit (HOSPITAL_COMMUNITY): Payer: Self-pay

## 2019-11-24 ENCOUNTER — Other Ambulatory Visit: Payer: Self-pay

## 2019-11-24 DIAGNOSIS — R9431 Abnormal electrocardiogram [ECG] [EKG]: Secondary | ICD-10-CM

## 2019-11-24 DIAGNOSIS — E1110 Type 2 diabetes mellitus with ketoacidosis without coma: Secondary | ICD-10-CM | POA: Diagnosis present

## 2019-11-24 DIAGNOSIS — R079 Chest pain, unspecified: Secondary | ICD-10-CM

## 2019-11-24 DIAGNOSIS — R778 Other specified abnormalities of plasma proteins: Secondary | ICD-10-CM

## 2019-11-24 DIAGNOSIS — I2109 ST elevation (STEMI) myocardial infarction involving other coronary artery of anterior wall: Secondary | ICD-10-CM

## 2019-11-24 HISTORY — PX: LEFT HEART CATH AND CORONARY ANGIOGRAPHY: CATH118249

## 2019-11-24 LAB — BETA-HYDROXYBUTYRIC ACID: Beta-Hydroxybutyric Acid: 1.83 mmol/L — ABNORMAL HIGH (ref 0.05–0.27)

## 2019-11-24 LAB — BASIC METABOLIC PANEL
Anion gap: 10 (ref 5–15)
Anion gap: 14 (ref 5–15)
Anion gap: 13 (ref 5–15)
Anion gap: 11 (ref 5–15)
BUN: 32 mg/dL — ABNORMAL HIGH (ref 6–20)
BUN: 28 mg/dL — ABNORMAL HIGH (ref 6–20)
BUN: 26 mg/dL — ABNORMAL HIGH (ref 6–20)
BUN: 23 mg/dL — ABNORMAL HIGH (ref 6–20)
CO2: 20 mmol/L — ABNORMAL LOW (ref 22–32)
CO2: 15 mmol/L — ABNORMAL LOW (ref 22–32)
CO2: 18 mmol/L — ABNORMAL LOW (ref 22–32)
CO2: 17 mmol/L — ABNORMAL LOW (ref 22–32)
Calcium: 8.5 mg/dL — ABNORMAL LOW (ref 8.9–10.3)
Calcium: 8.2 mg/dL — ABNORMAL LOW (ref 8.9–10.3)
Calcium: 8 mg/dL — ABNORMAL LOW (ref 8.9–10.3)
Calcium: 7.9 mg/dL — ABNORMAL LOW (ref 8.9–10.3)
Chloride: 116 mmol/L — ABNORMAL HIGH (ref 98–111)
Chloride: 115 mmol/L — ABNORMAL HIGH (ref 98–111)
Chloride: 112 mmol/L — ABNORMAL HIGH (ref 98–111)
Chloride: 112 mmol/L — ABNORMAL HIGH (ref 98–111)
Creatinine, Ser: 1.46 mg/dL — ABNORMAL HIGH (ref 0.44–1.00)
Creatinine, Ser: 1.23 mg/dL — ABNORMAL HIGH (ref 0.44–1.00)
Creatinine, Ser: 1.06 mg/dL — ABNORMAL HIGH (ref 0.44–1.00)
Creatinine, Ser: 0.96 mg/dL (ref 0.44–1.00)
GFR calc Af Amer: 55 mL/min — ABNORMAL LOW (ref >60)
GFR calc Af Amer: >60 mL/min (ref >60)
GFR calc Af Amer: >60 mL/min (ref >60)
GFR calc Af Amer: >60 mL/min (ref >60)
GFR calc non Af Amer: 47 mL/min — ABNORMAL LOW (ref >60)
GFR calc non Af Amer: 58 mL/min — ABNORMAL LOW (ref >60)
GFR calc non Af Amer: >60 mL/min (ref >60)
GFR calc non Af Amer: >60 mL/min (ref >60)
Glucose, Bld: 197 mg/dL — ABNORMAL HIGH (ref 70–99)
Glucose, Bld: 169 mg/dL — ABNORMAL HIGH (ref 70–99)
Glucose, Bld: 187 mg/dL — ABNORMAL HIGH (ref 70–99)
Glucose, Bld: 128 mg/dL — ABNORMAL HIGH (ref 70–99)
Potassium: 3.3 mmol/L — ABNORMAL LOW (ref 3.5–5.1)
Potassium: 3.5 mmol/L (ref 3.5–5.1)
Potassium: 3.5 mmol/L (ref 3.5–5.1)
Potassium: 3.3 mmol/L — ABNORMAL LOW (ref 3.5–5.1)
Sodium: 146 mmol/L — ABNORMAL HIGH (ref 135–145)
Sodium: 144 mmol/L (ref 135–145)
Sodium: 143 mmol/L (ref 135–145)
Sodium: 140 mmol/L (ref 135–145)

## 2019-11-24 LAB — CBC WITH DIFFERENTIAL/PLATELET
Abs Immature Granulocytes: 0.04 10*3/uL (ref 0.00–0.07)
Basophils Absolute: 0 10*3/uL (ref 0.0–0.1)
Basophils Relative: 0 %
Eosinophils Absolute: 0 10*3/uL (ref 0.0–0.5)
Eosinophils Relative: 0 %
HCT: 38.2 % (ref 36.0–46.0)
Hemoglobin: 13 g/dL (ref 12.0–15.0)
Immature Granulocytes: 1 %
Lymphocytes Relative: 11 %
Lymphs Abs: 0.9 10*3/uL (ref 0.7–4.0)
MCH: 33.1 pg (ref 26.0–34.0)
MCHC: 34 g/dL (ref 30.0–36.0)
MCV: 97.2 fL (ref 80.0–100.0)
Monocytes Absolute: 1.3 10*3/uL — ABNORMAL HIGH (ref 0.1–1.0)
Monocytes Relative: 15 %
Neutro Abs: 6.5 10*3/uL (ref 1.7–7.7)
Neutrophils Relative %: 73 %
Platelets: 147 10*3/uL — ABNORMAL LOW (ref 150–400)
RBC: 3.93 MIL/uL (ref 3.87–5.11)
RDW: 13.6 % (ref 11.5–15.5)
WBC: 8.8 10*3/uL (ref 4.0–10.5)
nRBC: 0.2 % (ref 0.0–0.2)

## 2019-11-24 LAB — LACTIC ACID, PLASMA
Lactic Acid, Venous: 2.2 mmol/L (ref 0.5–1.9)
Lactic Acid, Venous: 1.4 mmol/L (ref 0.5–1.9)

## 2019-11-24 LAB — GLUCOSE, CAPILLARY
Glucose-Capillary: 124 mg/dL — ABNORMAL HIGH (ref 70–99)
Glucose-Capillary: 127 mg/dL — ABNORMAL HIGH (ref 70–99)
Glucose-Capillary: 133 mg/dL — ABNORMAL HIGH (ref 70–99)
Glucose-Capillary: 136 mg/dL — ABNORMAL HIGH (ref 70–99)
Glucose-Capillary: 137 mg/dL — ABNORMAL HIGH (ref 70–99)
Glucose-Capillary: 153 mg/dL — ABNORMAL HIGH (ref 70–99)
Glucose-Capillary: 156 mg/dL — ABNORMAL HIGH (ref 70–99)
Glucose-Capillary: 161 mg/dL — ABNORMAL HIGH (ref 70–99)
Glucose-Capillary: 162 mg/dL — ABNORMAL HIGH (ref 70–99)
Glucose-Capillary: 165 mg/dL — ABNORMAL HIGH (ref 70–99)
Glucose-Capillary: 180 mg/dL — ABNORMAL HIGH (ref 70–99)
Glucose-Capillary: 183 mg/dL — ABNORMAL HIGH (ref 70–99)
Glucose-Capillary: 188 mg/dL — ABNORMAL HIGH (ref 70–99)
Glucose-Capillary: 189 mg/dL — ABNORMAL HIGH (ref 70–99)
Glucose-Capillary: 192 mg/dL — ABNORMAL HIGH (ref 70–99)
Glucose-Capillary: 196 mg/dL — ABNORMAL HIGH (ref 70–99)
Glucose-Capillary: 201 mg/dL — ABNORMAL HIGH (ref 70–99)
Glucose-Capillary: 207 mg/dL — ABNORMAL HIGH (ref 70–99)
Glucose-Capillary: 216 mg/dL — ABNORMAL HIGH (ref 70–99)
Glucose-Capillary: 219 mg/dL — ABNORMAL HIGH (ref 70–99)
Glucose-Capillary: 226 mg/dL — ABNORMAL HIGH (ref 70–99)

## 2019-11-24 LAB — TROPONIN I (HIGH SENSITIVITY)
Troponin I (High Sensitivity): >27000 ng/L (ref <18)
Troponin I (High Sensitivity): >27000 ng/L (ref <18)

## 2019-11-24 SURGERY — LEFT HEART CATH AND CORONARY ANGIOGRAPHY
Anesthesia: LOCAL

## 2019-11-24 SURGERY — CORONARY/GRAFT ACUTE MI REVASCULARIZATION
Anesthesia: LOCAL

## 2019-11-24 MED ORDER — SODIUM CHLORIDE 0.9 % IV SOLN: INTRAVENOUS | Status: AC

## 2019-11-24 MED ORDER — NITROGLYCERIN 1 MG/10 ML FOR IR/CATH LAB
INTRA_ARTERIAL | Status: AC
  Filled 2019-11-24: qty 10

## 2019-11-24 MED ORDER — NITROGLYCERIN IN D5W 200-5 MCG/ML-% IV SOLN
INTRAVENOUS | Status: AC
  Filled 2019-11-24: qty 250

## 2019-11-24 MED ORDER — INSULIN GLARGINE 100 UNIT/ML ~~LOC~~ SOLN
25.0000 [IU] | Freq: Every day | SUBCUTANEOUS | Status: DC
  Administered 2019-11-24 – 2019-11-25 (×2): 25 [IU] via SUBCUTANEOUS
  Filled 2019-11-24 (×2): qty 0.25

## 2019-11-24 MED ORDER — IOHEXOL 300 MG/ML  SOLN
100.0000 mL | Freq: Once | INTRAMUSCULAR | Status: AC | PRN
  Administered 2019-11-24: 23:00:00 100 mL via INTRAVENOUS

## 2019-11-24 MED ORDER — POTASSIUM CHLORIDE 10 MEQ/100ML IV SOLN
10.0000 meq | INTRAVENOUS | Status: AC
  Filled 2019-11-24 (×4): qty 100

## 2019-11-24 MED ORDER — LABETALOL HCL 5 MG/ML IV SOLN: 10.0000 mg | INTRAVENOUS | Status: AC | PRN

## 2019-11-24 MED ORDER — HEPARIN SODIUM (PORCINE) 5000 UNIT/ML IJ SOLN
5000.0000 [IU] | Freq: Three times a day (TID) | INTRAMUSCULAR | Status: DC
  Administered 2019-11-24: 5000 [IU] via SUBCUTANEOUS
  Filled 2019-11-24: qty 1

## 2019-11-24 MED ORDER — HEPARIN SODIUM (PORCINE) 5000 UNIT/ML IJ SOLN: 3000.0000 [IU] | Freq: Once | INTRAMUSCULAR | Status: DC

## 2019-11-24 MED ORDER — IOHEXOL 350 MG/ML SOLN
INTRAVENOUS | Status: DC | PRN
  Administered 2019-11-24: 08:00:00 130 mL

## 2019-11-24 MED ORDER — ASPIRIN 81 MG PO CHEW
CHEWABLE_TABLET | ORAL | Status: AC
  Filled 2019-11-24: qty 1

## 2019-11-24 MED ORDER — SODIUM CHLORIDE 0.9% FLUSH
3.0000 mL | Freq: Two times a day (BID) | INTRAVENOUS | Status: DC
  Administered 2019-11-24 – 2019-11-28 (×10): 3 mL via INTRAVENOUS

## 2019-11-24 MED ORDER — NITROGLYCERIN IN D5W 200-5 MCG/ML-% IV SOLN
0.0000 ug/min | INTRAVENOUS | Status: DC
  Administered 2019-11-24: 45 ug/min via INTRAVENOUS
  Administered 2019-11-24: 10 ug/min via INTRAVENOUS
  Administered 2019-11-24: 5 ug/min via INTRAVENOUS

## 2019-11-24 MED ORDER — HEPARIN SODIUM (PORCINE) 1000 UNIT/ML IJ SOLN
INTRAMUSCULAR | Status: AC
  Filled 2019-11-24: qty 1

## 2019-11-24 MED ORDER — ALBUTEROL SULFATE (2.5 MG/3ML) 0.083% IN NEBU: 2.5000 mg | INHALATION_SOLUTION | RESPIRATORY_TRACT | Status: DC | PRN

## 2019-11-24 MED ORDER — HEPARIN (PORCINE) 25000 UT/250ML-% IV SOLN
750.0000 [IU]/h | INTRAVENOUS | Status: DC
  Administered 2019-11-24: 07:00:00 750 [IU]/h via INTRAVENOUS
  Filled 2019-11-24: qty 250

## 2019-11-24 MED ORDER — HEPARIN (PORCINE) IN NACL 1000-0.9 UT/500ML-% IV SOLN
INTRAVENOUS | Status: DC | PRN
  Administered 2019-11-24 (×2): 500 mL

## 2019-11-24 MED ORDER — METOPROLOL TARTRATE 5 MG/5ML IV SOLN
2.5000 mg | Freq: Four times a day (QID) | INTRAVENOUS | Status: DC | PRN
  Administered 2019-11-24 – 2019-11-25 (×3): 5 mg via INTRAVENOUS
  Filled 2019-11-24 (×3): qty 5

## 2019-11-24 MED ORDER — VERAPAMIL HCL 2.5 MG/ML IV SOLN
INTRAVENOUS | Status: AC
  Filled 2019-11-24: qty 2

## 2019-11-24 MED ORDER — HEPARIN SODIUM (PORCINE) 5000 UNIT/ML IJ SOLN
2000.0000 [IU] | Freq: Once | INTRAMUSCULAR | Status: AC
  Administered 2019-11-24: 07:00:00 2000 [IU] via INTRAVENOUS
  Filled 2019-11-24: qty 1

## 2019-11-24 MED ORDER — HEPARIN SODIUM (PORCINE) 1000 UNIT/ML IJ SOLN
INTRAMUSCULAR | Status: DC | PRN
  Administered 2019-11-24: 8000 [IU] via INTRAVENOUS

## 2019-11-24 MED ORDER — ASPIRIN 325 MG PO TABS
325.0000 mg | ORAL_TABLET | Freq: Every day | ORAL | Status: DC
  Administered 2019-11-24: 06:00:00 325 mg via ORAL

## 2019-11-24 MED ORDER — HEPARIN (PORCINE) IN NACL 1000-0.9 UT/500ML-% IV SOLN
INTRAVENOUS | Status: AC
  Filled 2019-11-24: qty 500

## 2019-11-24 MED ORDER — HYDRALAZINE HCL 20 MG/ML IJ SOLN: 10.0000 mg | INTRAMUSCULAR | Status: AC | PRN

## 2019-11-24 MED ORDER — POTASSIUM CHLORIDE 10 MEQ/100ML IV SOLN
10.0000 meq | INTRAVENOUS | Status: AC
  Administered 2019-11-24 – 2019-11-25 (×4): 10 meq via INTRAVENOUS

## 2019-11-24 MED ORDER — IOHEXOL 350 MG/ML SOLN
INTRAVENOUS | Status: AC
  Filled 2019-11-24: qty 1

## 2019-11-24 MED ORDER — INSULIN ASPART 100 UNIT/ML ~~LOC~~ SOLN
2.0000 [IU] | SUBCUTANEOUS | Status: DC
  Administered 2019-11-24: 2 [IU] via SUBCUTANEOUS
  Administered 2019-11-25: 21:00:00 4 [IU] via SUBCUTANEOUS
  Administered 2019-11-25: 12:00:00 2 [IU] via SUBCUTANEOUS
  Administered 2019-11-26 (×2): 4 [IU] via SUBCUTANEOUS

## 2019-11-24 MED ORDER — SODIUM CHLORIDE 0.9 % IV SOLN: 250.0000 mL | INTRAVENOUS | Status: DC | PRN

## 2019-11-24 MED ORDER — SODIUM CHLORIDE 0.9% FLUSH: 3.0000 mL | INTRAVENOUS | Status: DC | PRN

## 2019-11-24 MED ORDER — POTASSIUM CHLORIDE 10 MEQ/100ML IV SOLN
10.0000 meq | INTRAVENOUS | Status: AC
  Administered 2019-11-24: 10 meq via INTRAVENOUS
  Filled 2019-11-24 (×2): qty 100

## 2019-11-24 MED ORDER — LIDOCAINE HCL (PF) 1 % IJ SOLN
INTRAMUSCULAR | Status: AC
  Filled 2019-11-24: qty 30

## 2019-11-24 MED ORDER — LIDOCAINE HCL (PF) 1 % IJ SOLN
INTRAMUSCULAR | Status: DC | PRN
  Administered 2019-11-24: 2 mL
  Administered 2019-11-24: 20 mL

## 2019-11-24 SURGICAL SUPPLY — 14 items
CATH INFINITI 5FR MULTPACK ANG (CATHETERS) ×2 IMPLANT
CLOSURE MYNX CONTROL 6F/7F (Vascular Products) ×2 IMPLANT
GLIDESHEATH SLEND SS 6F .021 (SHEATH) ×2 IMPLANT
GUIDEWIRE INQWIRE 1.5J.035X260 (WIRE) ×1 IMPLANT
INQWIRE 1.5J .035X260CM (WIRE) ×2
KIT ENCORE 26 ADVANTAGE (KITS) ×2 IMPLANT
KIT HEART LEFT (KITS) ×2 IMPLANT
PACK CARDIAC CATHETERIZATION (CUSTOM PROCEDURE TRAY) ×2 IMPLANT
SHEATH PINNACLE 6F 10CM (SHEATH) ×2 IMPLANT
SHEATH PROBE COVER 6X72 (BAG) ×2 IMPLANT
SYR MEDRAD MARK 7 150ML (SYRINGE) ×2 IMPLANT
TRANSDUCER W/STOPCOCK (MISCELLANEOUS) ×2 IMPLANT
TUBING CIL FLEX 10 FLL-RA (TUBING) ×2 IMPLANT
WIRE EMERALD 3MM-J .035X150CM (WIRE) ×2 IMPLANT

## 2019-11-24 NOTE — Progress Notes (Signed)
eLink Physician-Brief Progress Note Patient Name: Madison Williams DOB: 07/28/1986 MRN: [id number]   Date of Service  11/24/2019  HPI/Events of Note  Pt transitioning from Insulin infusion to Levemir +  SSI coverage post-DKA.  eICU Interventions  Phase 3 transition Insulin orders entered.        Thomasene Lot Chantae Soo 11/24/2019, 10:53 PM

## 2019-11-24 NOTE — Progress Notes (Signed)
Spoke with Madison Williams in the Cath Lab and she states the patient never needed an IV not sure why the order was placed

## 2019-11-24 NOTE — Progress Notes (Signed)
Patient vape was given to her significant other Fayrene Fearing Daughterly) at 2100 before he left. Fayrene Fearing stated he gave it to her and apologized for her smoking it in the hospital and if it affected her care in any way. Patient significant other educated and asked not to bring it back.

## 2019-11-24 NOTE — Care Management (Signed)
Case Management called and made a Primary Care Physician follow up visit with [facility] on Wednesday, May 5 at 3:30 pm.

## 2019-11-24 NOTE — Progress Notes (Signed)
Patient was found smoking her vape in her room. RN educated the patient on the safety risks involved with smoking in the patient's room. Vape was taken away and will be given to security shortly. Vape was handed to night shift RN.

## 2019-11-24 NOTE — Progress Notes (Signed)
[facility] ADULT ICU REPLACEMENT PROTOCOL FOR AM LAB REPLACEMENT ONLY  The patient does apply for the [facility] Adult ICU Electrolyte Replacment Protocol based on the criteria listed below:   1. Is GFR >/= 40 ml/min? Yes.    Patient's GFR today is 47 2. Is urine output >/= 0.5 ml/kg/hr for the last 6 hours? Yes.   Patient's UOP is 0.9 ml/kg/hr 3. Is BUN < 60 mg/dL? Yes.    Patient's BUN today is 32 4. Abnormal electrolyte(s): K+3.3 5. Ordered repletion with: Protocol Peripheral 6. If a panic level lab has been reported, has the CCM MD in charge been notified? Yes.  .   Physician:  Reyne Dumas 11/24/2019 6:17 AM

## 2019-11-24 NOTE — Progress Notes (Signed)
Inpatient Diabetes Program Recommendations  AACE/ADA: New Consensus Statement on Inpatient Glycemic Control (2015)  Target Ranges:  Prepandial:   less than 140 mg/dL      Peak postprandial:   less than 180 mg/dL (1-2 hours)      Critically ill patients:  140 - 180 mg/dL   Lab Results  Component Value Date   GLUCAP 180 (H) 11/24/2019   HGBA1C 10.8 (H) 10/17/2019    Review of Glycemic Control  Diabetes history: DM type 1 Outpatient Diabetes medications: Lantus 25 units Daily, Novolog 3 units tid Current orders for Inpatient glycemic control:  IV insulin Endotool DKA  Inpatient Diabetes Program Recommendations:    Glucose >1,300 on presentation. BMET clearing DKA at 0500 this am.   Spoke with floor RN about plan of care with pt. RN to ask MD about r/o PE.  Spoke with pt at bedside pt has insulin and DM supplies at home had tried to go to [facility] for follow up but was told the appt was over the phone and the clinic cancelled appt. Told pt she has another appt made. PLEASE make clear if to pt if appt is over the phone or in person. Pt will most likely need insulin refills at that time as her current supply is from one of her previous hospitalizations.  Pt critically ill at this point continue with IV insulin at this point. Switch to ICU management if DKA is cleared until more stable. We can then transition to SQ insulin later.  Thanks,  Christena Deem RN, MSN, BC-ADM Inpatient Diabetes Coordinator Team Pager [phone] (8a-5p)

## 2019-11-24 NOTE — Progress Notes (Signed)
ANTICOAGULATION CONSULT NOTE - Initial Consult  Pharmacy Consult for Heparin Indication: chest pain/ACS  No Known Allergies  Patient Measurements: Height: 5\' 7"  (170.2 cm) Weight: 51.1 kg (112 lb 10.5 oz) IBW/kg (Calculated) : 61.6 Heparin Dosing Weight:   Vital Signs: BP: 138/80 (04/26 0500) Pulse Rate: 88 (04/26 0500)  Labs: Recent Labs    11/23/19 1154 11/23/19 1154 11/23/19 1206 11/23/19 2118 11/24/19 0525  HGB 13.7   < > 15.6*  --  13.0  HCT 46.5*  --  46.0  --  38.2  PLT 232  --   --   --  147*  CREATININE 3.38*   < > 2.10* 1.94* 1.46*   < > = values in this interval not displayed.    Estimated Creatinine Clearance: 44.6 mL/min (A) (by C-G formula based on SCr of 1.46 mg/dL (H)).   Medical History: Past Medical History:  Diagnosis Date  . Asthma   . Diabetes mellitus without complication (HCC)   . Rheumatoid arthritis (HCC)     Medications:  Infusions:  . sodium chloride Stopped (11/23/19 2307)  . cefTRIAXone (ROCEPHIN)  IV 2 g (11/23/19 1824)  . dextrose 5 % and 0.45% NaCl 75 mL/hr at 11/23/19 2306  . heparin    . insulin 1.2 Units/hr (11/24/19 0555)  . nitroGLYCERIN    . nitroGLYCERIN    . potassium chloride      Assessment: Patient with ACS noted from ECG.  MD ordered heparin per pharmacy for ACS.  Heparin 5000 units sq last dose ~0600 4/26, has been d/c'd.  Will give reduced heparin bolus due to prior heparin. Baseline coags ordered.  Goal of Therapy:  Heparin level 0.3-0.7 units/ml Monitor platelets by anticoagulation protocol: Yes   Plan:  Heparin bolus 2000 units iv x1 Heparin drip at 750 units/hr Daily CBC Next heparin level at [address], [place] 11/24/2019,6:11 AM

## 2019-11-24 NOTE — Progress Notes (Signed)
Patient presented STE approximately [phone] with chest pain radiate to back and shoulder., EKG confirmed, Elink called , code stemi started. [facility] on the way to [facility]. Emergency contact [employer] (mom) called

## 2019-11-24 NOTE — Progress Notes (Signed)
Repeat  Lactic ordered with morning labs per MD Warrick Parisian

## 2019-11-24 NOTE — Progress Notes (Signed)
eLink Physician-Brief Progress Note Patient Name: Madison Williams DOB: Feb 08, 1986 MRN: [id number]   Date of Service  11/24/2019  HPI/Events of Note  Right lower quadrant pain with guarding  eICU Interventions  CT abdomen pelvis with contrast ordered.        Migdalia Dk 11/24/2019, 9:31 PM

## 2019-11-24 NOTE — Consult Note (Signed)
Cardiology Consultation:   Patient ID: Madison Williams MRN: [id number]; DOB: April 12, 1986  Admit date: 11/23/2019 Date of Consult: 11/24/2019  Primary Care Provider: Patient, No Pcp Per Primary Cardiologist: No primary care provider on file.  Primary Electrophysiologist:  None    Patient Profile:   Madison Williams is a 34 y.o. female with a hx of RA, tobacco abuse, DM, asthma who is being seen today for the evaluation of chest pain at the request of Dr. Wynona Neat.   History of Present Illness:    [name] is a 34 y.o. female with a hx of DM, asthma and RA who is being seen today for the evaluation of chest pain at the request of Dr. Wynona Neat. She was admitted to [facility] on 11/23/19 with hyperglycemia/DKA and confusion. She was in renal failure with creatinine over 3. She is on an insulin drip. She began to have chest pain this am and her EKG showed anterior and lateral ST elevation. Code STEMI called by the ICU team at [facility]. Pt having chest pain when she arrived at [facility]. Creatinine today is 1.46.    Past Medical History:  Diagnosis Date  . Asthma   . Diabetes mellitus without complication (HCC)   . Rheumatoid arthritis [facility])     Past Surgical History:  Procedure Laterality Date  . LAPAROSCOPIC OVARIAN       Inpatient Medications: Scheduled Meds: . aspirin      . aspirin  325 mg Oral Daily  . Chlorhexidine Gluconate Cloth  6 each Topical Daily  . mouth rinse  15 mL Mouth Rinse BID  . pantoprazole (PROTONIX) IV  40 mg Intravenous QHS   Continuous Infusions: . sodium chloride Stopped (11/23/19 2307)  . cefTRIAXone (ROCEPHIN)  IV 2 g (11/23/19 1824)  . dextrose 5 % and 0.45% NaCl 75 mL/hr at 11/23/19 2306  . heparin 750 Units/hr (11/24/19 0631)  . insulin 1.2 Units/hr (11/24/19 0555)  . nitroGLYCERIN    . nitroGLYCERIN 5 mcg/min (11/24/19 0612)  . potassium chloride     PRN Meds: acetaminophen, acetaminophen, albuterol, dextrose,  docusate sodium, metoprolol tartrate, ondansetron (ZOFRAN) IV, polyethylene glycol, promethazine  Allergies:   No Known Allergies  Social History:   Social History   Socioeconomic History  . Marital status: Single    Spouse name: Not on file  . Number of children: Not on file  . Years of education: Not on file  . Highest education level: Not on file  Occupational History  . Not on file  Tobacco Use  . Smoking status: Current Every Day Smoker    Packs/day: 1.00  . Smokeless tobacco: Never Used  Substance and Sexual Activity  . Alcohol use: Yes    Comment: rarely  . Drug use: Never  . Sexual activity: Not on file  Other Topics Concern  . Not on file  Social History Narrative  . Not on file   Social Determinants of Health   Financial Resource Strain:   . Difficulty of Paying Living Expenses:   Food Insecurity:   . Worried About Programme researcher, broadcasting/film/video in the Last Year:   . Barista in the Last Year:   Transportation Needs:   . Freight forwarder (Medical):   Marland Kitchen Lack of Transportation (Non-Medical):   Physical Activity:   . Days of Exercise per Week:   . Minutes of Exercise per Session:   Stress:   . Feeling of Stress :   Social  Connections:   . Frequency of Communication with Friends and Family:   . Frequency of Social Gatherings with Friends and Family:   . Attends Religious Services:   . Active Member of Clubs or Organizations:   . Attends Banker Meetings:   Marland Kitchen Marital Status:   Intimate Partner Violence:   . Fear of Current or Ex-Partner:   . Emotionally Abused:   Marland Kitchen Physically Abused:   . Sexually Abused:     Family History:   No family history on file.  No CAD  ROS:  Please see the history of present illness.  All other ROS reviewed and negative.     Physical Exam/Data:   Vitals:   11/24/19 0200 11/24/19 0300 11/24/19 0400 11/24/19 0500  BP: 130/90 (!) 136/92 129/82 138/80  Pulse: 93 89 88 88  Resp: (!) 24 (!) 32 20 (!) 32    Temp:      TempSrc:      SpO2: 100% 100% 100% 100%  Weight:    51.1 kg  Height:        Intake/Output Summary (Last 24 hours) at 11/24/2019 0649 Last data filed at 11/24/2019 0415 Gross per 24 hour  Intake 947.93 ml  Output 300 ml  Net 647.93 ml   Last 3 Weights 11/24/2019 11/23/2019 10/19/2019  Weight (lbs) 112 lb 10.5 oz 120 lb 119 lb 0.8 oz  Weight (kg) 51.1 kg 54.432 kg 54 kg     Body mass index is 17.64 kg/m.  General:  Well nourished, well developed, appears uncomfortable HEENT: normal Lymph: no adenopathy Neck: no JVD Endocrine:  No thryomegaly Vascular: No carotid bruits; FA pulses 2+ bilaterally without bruits  Cardiac:  normal S1, S2; RRR; no murmur  Lungs:  clear to auscultation bilaterally, no wheezing, rhonchi or rales  Abd: soft, nontender, no hepatomegaly  Ext: no edema Musculoskeletal:  No deformities, BUE and BLE strength normal and equal Skin: warm and dry  Neuro:  CNs 2-12 intact, no focal abnormalities noted Psych:  Normal affect   EKG:  The EKG was personally reviewed and demonstrates:  Sinus, anterior and lateral ST elevation  Relevant CV Studies:   Laboratory Data:  High Sensitivity Troponin:  No results for input(s): TROPONINIHS in the last 720 hours.   Chemistry Recent Labs  Lab 11/23/19 1154 11/23/19 1154 11/23/19 1206 11/23/19 2118 11/24/19 0525  NA 127*   < > 122* 142 146*  K 6.5*   < > 6.7* 3.5 3.3*  CL 85*   < > 98 113* 116*  CO2 <7*  --   --  13* 20*  GLUCOSE 1,392*   < > >700* 506* 197*  BUN 42*   < > 49* 35* 32*  CREATININE 3.38*   < > 2.10* 1.94* 1.46*  CALCIUM 9.3  --   --  8.5* 8.5*  GFRNONAA 17*  --   --  33* 47*  GFRAA 20*  --   --  39* 55*  ANIONGAP NOT CALCULATED  --   --  16* 10   < > = values in this interval not displayed.    Recent Labs  Lab 11/23/19 2118  PROT 6.1*  ALBUMIN 3.4*  AST 34  ALT 44  ALKPHOS 95  BILITOT 1.7*   Hematology Recent Labs  Lab 11/23/19 1154 11/23/19 1206 11/24/19 0525  WBC  14.3*  --  8.8  RBC 4.10  --  3.93  HGB 13.7 15.6* 13.0  HCT 46.5* 46.0 38.2  MCV 113.4*  --  97.2  MCH 33.4  --  33.1  MCHC 29.5*  --  34.0  RDW 13.8  --  13.6  PLT 232  --  147*   BNPNo results for input(s): BNP, PROBNP in the last 168 hours.  DDimer No results for input(s): DDIMER in the last 168 hours.   Radiology/Studies:  DG CHEST PORT 1 VIEW  Result Date: 11/23/2019 CLINICAL DATA:  Short of breath, fever, diabetic ketoacidosis EXAM: PORTABLE CHEST 1 VIEW COMPARISON:  10/16/2019 FINDINGS: Single frontal view of the chest demonstrates a stable cardiac silhouette. Chronic interstitial scarring throughout the lungs without airspace disease, effusion, or pneumothorax. No acute bony abnormalities. IMPRESSION: 1. No acute intrathoracic process. Electronically Signed   By: Randa Ngo M.D.   On: 11/23/2019 19:22   {  Assessment and Plan:   1. Acute anterolateral STEMI: Will plan emergent cardiac cath.   For questions or updates, please contact [place] Please consult [email or web address] for contact info under   Signed, Lauree Chandler, MD  11/24/2019 6:49 AM

## 2019-11-24 NOTE — Progress Notes (Signed)
CRITICAL VALUE ALERT  Critical Value:  Lactic acid 2.2 collected 11/23/19 1800 resulted at 11/24/19@0031  Date & Time Notied: 11/24/19 0036  Provider Notified: Kathryne Eriksson link RN / Warrick Parisian  Orders Received/Actions taken: na

## 2019-11-24 NOTE — Progress Notes (Signed)
eLink Physician-Brief Progress Note Patient Name: Madison Williams DOB: March 03, 1986 MRN: [id number]   Date of Service  11/24/2019  HPI/Events of Note  Patient complaining of chest pains  and 12 lead EKG shows lateral ST segment elevation.  eICU Interventions  Cardiology paged stat, Aspirin 325  Mg po, Lopressor 5 mg iv x 1, Nitroglycerine infusion ordered, Troponin, CK with MB ordered,.        Thomasene Lot Ogan 11/24/2019, 6:03 AM

## 2019-11-25 ENCOUNTER — Inpatient Hospital Stay (HOSPITAL_COMMUNITY): Payer: Medicaid Other

## 2019-11-25 DIAGNOSIS — R58 Hemorrhage, not elsewhere classified: Secondary | ICD-10-CM

## 2019-11-25 DIAGNOSIS — I201 Angina pectoris with documented spasm: Secondary | ICD-10-CM

## 2019-11-25 DIAGNOSIS — E1065 Type 1 diabetes mellitus with hyperglycemia: Secondary | ICD-10-CM

## 2019-11-25 DIAGNOSIS — K683 Retroperitoneal hematoma: Secondary | ICD-10-CM

## 2019-11-25 DIAGNOSIS — E876 Hypokalemia: Secondary | ICD-10-CM

## 2019-11-25 DIAGNOSIS — R0602 Shortness of breath: Secondary | ICD-10-CM

## 2019-11-25 DIAGNOSIS — I351 Nonrheumatic aortic (valve) insufficiency: Secondary | ICD-10-CM

## 2019-11-25 DIAGNOSIS — R778 Other specified abnormalities of plasma proteins: Secondary | ICD-10-CM

## 2019-11-25 DIAGNOSIS — D62 Acute posthemorrhagic anemia: Secondary | ICD-10-CM

## 2019-11-25 DIAGNOSIS — D7589 Other specified diseases of blood and blood-forming organs: Secondary | ICD-10-CM

## 2019-11-25 DIAGNOSIS — E119 Type 2 diabetes mellitus without complications: Secondary | ICD-10-CM

## 2019-11-25 DIAGNOSIS — I2109 ST elevation (STEMI) myocardial infarction involving other coronary artery of anterior wall: Secondary | ICD-10-CM

## 2019-11-25 DIAGNOSIS — D696 Thrombocytopenia, unspecified: Secondary | ICD-10-CM

## 2019-11-25 DIAGNOSIS — K661 Hemoperitoneum: Secondary | ICD-10-CM

## 2019-11-25 DIAGNOSIS — R1030 Lower abdominal pain, unspecified: Secondary | ICD-10-CM

## 2019-11-25 DIAGNOSIS — R9431 Abnormal electrocardiogram [ECG] [EKG]: Secondary | ICD-10-CM

## 2019-11-25 DIAGNOSIS — D72819 Decreased white blood cell count, unspecified: Secondary | ICD-10-CM

## 2019-11-25 LAB — TYPE AND SCREEN
ABO/RH(D): B POS
Antibody Screen: NEGATIVE

## 2019-11-25 LAB — BASIC METABOLIC PANEL
Anion gap: 11 (ref 5–15)
Anion gap: 11 (ref 5–15)
Anion gap: 11 (ref 5–15)
BUN: 19 mg/dL (ref 6–20)
BUN: 16 mg/dL (ref 6–20)
BUN: 10 mg/dL (ref 6–20)
CO2: 17 mmol/L — ABNORMAL LOW (ref 22–32)
CO2: 18 mmol/L — ABNORMAL LOW (ref 22–32)
CO2: 22 mmol/L (ref 22–32)
Calcium: 8.1 mg/dL — ABNORMAL LOW (ref 8.9–10.3)
Calcium: 8.3 mg/dL — ABNORMAL LOW (ref 8.9–10.3)
Calcium: 8.6 mg/dL — ABNORMAL LOW (ref 8.9–10.3)
Chloride: 111 mmol/L (ref 98–111)
Chloride: 114 mmol/L — ABNORMAL HIGH (ref 98–111)
Chloride: 110 mmol/L (ref 98–111)
Creatinine, Ser: 0.86 mg/dL (ref 0.44–1.00)
Creatinine, Ser: 0.89 mg/dL (ref 0.44–1.00)
Creatinine, Ser: 0.76 mg/dL (ref 0.44–1.00)
GFR calc Af Amer: >60 mL/min (ref >60)
GFR calc Af Amer: >60 mL/min (ref >60)
GFR calc Af Amer: >60 mL/min (ref >60)
GFR calc non Af Amer: >60 mL/min (ref >60)
GFR calc non Af Amer: >60 mL/min (ref >60)
GFR calc non Af Amer: >60 mL/min (ref >60)
Glucose, Bld: 79 mg/dL (ref 70–99)
Glucose, Bld: 87 mg/dL (ref 70–99)
Glucose, Bld: 126 mg/dL — ABNORMAL HIGH (ref 70–99)
Potassium: 3.2 mmol/L — ABNORMAL LOW (ref 3.5–5.1)
Potassium: 3 mmol/L — ABNORMAL LOW (ref 3.5–5.1)
Potassium: 3.3 mmol/L — ABNORMAL LOW (ref 3.5–5.1)
Sodium: 139 mmol/L (ref 135–145)
Sodium: 143 mmol/L (ref 135–145)
Sodium: 143 mmol/L (ref 135–145)

## 2019-11-25 LAB — RETICULOCYTES
Immature Retic Fract: 4.6 % (ref 2.3–15.9)
RBC.: 2.9 MIL/uL — ABNORMAL LOW (ref 3.87–5.11)
Retic Count, Absolute: 22.3 10*3/uL (ref 19.0–186.0)
Retic Ct Pct: 0.8 % (ref 0.4–3.1)

## 2019-11-25 LAB — CBC
HCT: 27.6 % — ABNORMAL LOW (ref 36.0–46.0)
HCT: 30 % — ABNORMAL LOW (ref 36.0–46.0)
HCT: 31.8 % — ABNORMAL LOW (ref 36.0–46.0)
Hemoglobin: 9.3 g/dL — ABNORMAL LOW (ref 12.0–15.0)
Hemoglobin: 10.3 g/dL — ABNORMAL LOW (ref 12.0–15.0)
Hemoglobin: 10.8 g/dL — ABNORMAL LOW (ref 12.0–15.0)
MCH: 32.4 pg (ref 26.0–34.0)
MCH: 32.7 pg (ref 26.0–34.0)
MCH: 32.7 pg (ref 26.0–34.0)
MCHC: 33.7 g/dL (ref 30.0–36.0)
MCHC: 34.3 g/dL (ref 30.0–36.0)
MCHC: 34 g/dL (ref 30.0–36.0)
MCV: 96.2 fL (ref 80.0–100.0)
MCV: 95.2 fL (ref 80.0–100.0)
MCV: 96.4 fL (ref 80.0–100.0)
Platelets: 129 10*3/uL — ABNORMAL LOW (ref 150–400)
Platelets: 122 10*3/uL — ABNORMAL LOW (ref 150–400)
Platelets: 120 10*3/uL — ABNORMAL LOW (ref 150–400)
RBC: 2.87 MIL/uL — ABNORMAL LOW (ref 3.87–5.11)
RBC: 3.15 MIL/uL — ABNORMAL LOW (ref 3.87–5.11)
RBC: 3.3 MIL/uL — ABNORMAL LOW (ref 3.87–5.11)
RDW: 14.4 % (ref 11.5–15.5)
RDW: 14.6 % (ref 11.5–15.5)
RDW: 14.6 % (ref 11.5–15.5)
WBC: 3.6 10*3/uL — ABNORMAL LOW (ref 4.0–10.5)
WBC: 3.1 10*3/uL — ABNORMAL LOW (ref 4.0–10.5)
WBC: 3 10*3/uL — ABNORMAL LOW (ref 4.0–10.5)
nRBC: 0 % (ref 0.0–0.2)
nRBC: 0 % (ref 0.0–0.2)
nRBC: 0 % (ref 0.0–0.2)

## 2019-11-25 LAB — GLUCOSE, CAPILLARY
Glucose-Capillary: 109 mg/dL — ABNORMAL HIGH (ref 70–99)
Glucose-Capillary: 132 mg/dL — ABNORMAL HIGH (ref 70–99)
Glucose-Capillary: 177 mg/dL — ABNORMAL HIGH (ref 70–99)
Glucose-Capillary: 188 mg/dL — ABNORMAL HIGH (ref 70–99)
Glucose-Capillary: 61 mg/dL — ABNORMAL LOW (ref 70–99)
Glucose-Capillary: 75 mg/dL (ref 70–99)
Glucose-Capillary: 97 mg/dL (ref 70–99)

## 2019-11-25 LAB — HEMOGLOBIN AND HEMATOCRIT, BLOOD
HCT: 29.4 % — ABNORMAL LOW (ref 36.0–46.0)
HCT: 28.4 % — ABNORMAL LOW (ref 36.0–46.0)
Hemoglobin: 9.8 g/dL — ABNORMAL LOW (ref 12.0–15.0)
Hemoglobin: 9.6 g/dL — ABNORMAL LOW (ref 12.0–15.0)

## 2019-11-25 LAB — APTT: aPTT: 33 seconds (ref 24–36)

## 2019-11-25 LAB — ABO/RH: ABO/RH(D): B POS

## 2019-11-25 LAB — LIPID PANEL
Cholesterol: 206 mg/dL — ABNORMAL HIGH (ref 0–200)
HDL: 98 mg/dL (ref >40)
LDL Cholesterol: 92 mg/dL (ref 0–99)
Total CHOL/HDL Ratio: 2.1 RATIO
Triglycerides: 79 mg/dL (ref <150)
VLDL: 16 mg/dL (ref 0–40)

## 2019-11-25 LAB — PROTIME-INR
INR: 1.1 (ref 0.8–1.2)
Prothrombin Time: 13.6 seconds (ref 11.4–15.2)

## 2019-11-25 LAB — IRON AND TIBC
Iron: 129 ug/dL (ref 28–170)
Saturation Ratios: 57 % — ABNORMAL HIGH (ref 10.4–31.8)
TIBC: 228 ug/dL — ABNORMAL LOW (ref 250–450)
UIBC: 99 ug/dL

## 2019-11-25 LAB — FERRITIN: Ferritin: 155 ng/mL (ref 11–307)

## 2019-11-25 LAB — MAGNESIUM: Magnesium: 1.7 mg/dL (ref 1.7–2.4)

## 2019-11-25 LAB — ECHOCARDIOGRAM COMPLETE
Height: 67 in
Weight: 1858.92 oz

## 2019-11-25 LAB — VITAMIN B12: Vitamin B-12: 587 pg/mL (ref 180–914)

## 2019-11-25 LAB — FOLATE: Folate: 8.5 ng/mL (ref >5.9)

## 2019-11-25 LAB — TROPONIN I (HIGH SENSITIVITY): Troponin I (High Sensitivity): 10961 ng/L (ref <18)

## 2019-11-25 MED ORDER — METOCLOPRAMIDE HCL 5 MG/5ML PO SOLN
5.0000 mg | Freq: Three times a day (TID) | ORAL | Status: DC
  Administered 2019-11-25 – 2019-11-29 (×10): 5 mg via ORAL
  Filled 2019-11-25 (×13): qty 10

## 2019-11-25 MED ORDER — AMLODIPINE BESYLATE 5 MG PO TABS: 2.5000 mg | ORAL_TABLET | Freq: Every day | ORAL | Status: DC

## 2019-11-25 MED ORDER — ALUM & MAG HYDROXIDE-SIMETH 200-200-20 MG/5ML PO SUSP: 30.0000 mL | Freq: Four times a day (QID) | ORAL | Status: DC | PRN

## 2019-11-25 MED ORDER — PERFLUTREN LIPID MICROSPHERE
1.0000 mL | INTRAVENOUS | Status: AC | PRN
  Administered 2019-11-25: 10:00:00 3 mL via INTRAVENOUS
  Filled 2019-11-25: qty 10

## 2019-11-25 MED ORDER — METOCLOPRAMIDE HCL 5 MG/5ML PO SOLN
5.0000 mg | Freq: Three times a day (TID) | ORAL | Status: DC
  Filled 2019-11-25 (×5): qty 5

## 2019-11-25 MED ORDER — PERFLUTREN LIPID MICROSPHERE
INTRAVENOUS | Status: AC
  Filled 2019-11-25: qty 10

## 2019-11-25 MED ORDER — LIDOCAINE VISCOUS HCL 2 % MT SOLN
15.0000 mL | Freq: Once | OROMUCOSAL | Status: AC
  Administered 2019-11-25: 15 mL via ORAL
  Filled 2019-11-25: qty 15

## 2019-11-25 MED ORDER — POTASSIUM CHLORIDE CRYS ER 20 MEQ PO TBCR
40.0000 meq | EXTENDED_RELEASE_TABLET | Freq: Once | ORAL | Status: DC
  Filled 2019-11-25: qty 2

## 2019-11-25 MED ORDER — MORPHINE SULFATE (PF) 4 MG/ML IV SOLN
4.0000 mg | Freq: Once | INTRAVENOUS | Status: AC
  Administered 2019-11-25: 20:00:00 4 mg via INTRAVENOUS
  Filled 2019-11-25: qty 1

## 2019-11-25 MED ORDER — MAGNESIUM SULFATE 2 GM/50ML IV SOLN
2.0000 g | Freq: Once | INTRAVENOUS | Status: AC
  Administered 2019-11-25: 15:00:00 2 g via INTRAVENOUS
  Filled 2019-11-25: qty 50

## 2019-11-25 MED ORDER — ASPIRIN EC 81 MG PO TBEC
81.0000 mg | DELAYED_RELEASE_TABLET | Freq: Every day | ORAL | Status: DC
  Administered 2019-11-26 – 2019-11-28 (×3): 81 mg via ORAL
  Filled 2019-11-25 (×4): qty 1

## 2019-11-25 MED ORDER — POTASSIUM CHLORIDE 10 MEQ/100ML IV SOLN
10.0000 meq | INTRAVENOUS | Status: AC
  Administered 2019-11-25 (×4): 10 meq via INTRAVENOUS
  Filled 2019-11-25: qty 100

## 2019-11-25 MED ORDER — AMLODIPINE BESYLATE 5 MG PO TABS
5.0000 mg | ORAL_TABLET | Freq: Every day | ORAL | Status: DC
  Administered 2019-11-26 – 2019-11-28 (×3): 5 mg via ORAL
  Filled 2019-11-25 (×3): qty 1

## 2019-11-25 MED ORDER — POTASSIUM CHLORIDE 10 MEQ/100ML IV SOLN
10.0000 meq | INTRAVENOUS | Status: AC
  Administered 2019-11-25 – 2019-11-26 (×4): 10 meq via INTRAVENOUS
  Filled 2019-11-25 (×4): qty 100

## 2019-11-25 MED ORDER — INSULIN GLARGINE 100 UNIT/ML ~~LOC~~ SOLN
22.0000 [IU] | Freq: Every day | SUBCUTANEOUS | Status: DC
  Filled 2019-11-25: qty 0.22

## 2019-11-25 MED ORDER — ACETAMINOPHEN 160 MG/5ML PO SOLN
500.0000 mg | Freq: Four times a day (QID) | ORAL | Status: DC | PRN
  Administered 2019-11-25 – 2019-11-29 (×10): 500 mg via ORAL
  Filled 2019-11-25 (×10): qty 20.3

## 2019-11-25 MED FILL — Nitroglycerin IV Soln 100 MCG/ML in D5W: INTRA_ARTERIAL | Qty: 10 | Status: AC

## 2019-11-25 MED FILL — Verapamil HCl IV Soln 2.5 MG/ML: INTRAVENOUS | Qty: 2 | Status: AC

## 2019-11-25 NOTE — Consult Note (Signed)
Hospital Consult    Reason for Consult:  RP hematoma Referring Physician:  Critical care MRN #:  [id number]  History of Present Illness: This is a 34 y.o. female with history of diabetes and asthma the vascular surgery has been consulted with concern for right retroperitoneal hematoma.  Patient reportedly had some right lower quadrant abdominal pain and critical care ordered a CT scan.  CT showed a right pelvic sidewall hematoma and along the right lower quadrant retroperitoneum.  Initially met at [facility] long with DKA on 11/23/2019.  Ultimately started having chest pain and there was a code STEMI called.  Patient underwent right common femoral artery access yesterday with Dr. Angelena Form.  Ultimately there was no evident evidence of significant coronary artery disease and her EF was 35 to 45%.  She states her right lower quadrant only hurts when she is moving but is pretty comfortable when sitting still.  Past Medical History:  Diagnosis Date  . Asthma   . Diabetes mellitus without complication ([place])   . Rheumatoid arthritis [facility])     Past Surgical History:  Procedure Laterality Date  . LAPAROSCOPIC OVARIAN    . LEFT HEART CATH AND CORONARY ANGIOGRAPHY N/A 11/24/2019   Procedure: LEFT HEART CATH AND CORONARY ANGIOGRAPHY;  Surgeon: Burnell Blanks, MD;  Location: [place] CV LAB;  Service: Cardiovascular;  Laterality: N/A;    No Known Allergies  Prior to Admission medications   Medication Sig Start Date End Date Taking? Authorizing Provider  albuterol (PROVENTIL HFA;VENTOLIN HFA) 108 (90 Base) MCG/ACT inhaler Inhale 2 puffs into the lungs every 6 (six) hours as needed for wheezing or shortness of breath.    [provider]  insulin aspart (NOVOLOG) 100 UNIT/ML injection Inject 3 Units into the skin 3 (three) times daily with meals. 10/19/19   Domenic Polite, MD  insulin glargine (LANTUS) 100 UNIT/ML injection Inject 0.25 mLs (25 Units total) into the skin daily. 10/19/19    Domenic Polite, MD  nicotine (NICODERM CQ - DOSED IN MG/24 HOURS) 14 mg/24hr patch Place 1 patch (14 mg total) onto the skin daily. Patient not taking: Reported on 01/30/2019 04/01/18   Lavina Hamman, MD  promethazine (PHENERGAN) 12.5 MG tablet Take 1 tablet (12.5 mg total) by mouth every 6 (six) hours as needed for nausea or vomiting. 10/19/19   Domenic Polite, MD    Social History   Socioeconomic History  . Marital status: Single    Spouse name: Not on file  . Number of children: Not on file  . Years of education: Not on file  . Highest education level: Not on file  Occupational History  . Not on file  Tobacco Use  . Smoking status: Current Every Day Smoker    Packs/day: 1.00  . Smokeless tobacco: Never Used  Substance and Sexual Activity  . Alcohol use: Yes    Comment: rarely  . Drug use: Never  . Sexual activity: Not on file  Other Topics Concern  . Not on file  Social History Narrative  . Not on file   Social Determinants of Health   Financial Resource Strain:   . Difficulty of Paying Living Expenses:   Food Insecurity:   . Worried About Charity fundraiser in the Last Year:   . Arboriculturist in the Last Year:   Transportation Needs:   . Film/video editor (Medical):   Marland Kitchen Lack of Transportation (Non-Medical):   Physical Activity:   . Days of Exercise per  Week:   . Minutes of Exercise per Session:   Stress:   . Feeling of Stress :   Social Connections:   . Frequency of Communication with Friends and Family:   . Frequency of Social Gatherings with Friends and Family:   . Attends Religious Services:   . Active Member of Clubs or Organizations:   . Attends Archivist Meetings:   Marland Kitchen Marital Status:   Intimate Partner Violence:   . Fear of Current or Ex-Partner:   . Emotionally Abused:   Marland Kitchen Physically Abused:   . Sexually Abused:      No family history on file.  ROS: '[x]'  Positive   '[ ]'  Negative   '[ ]'  All sytems reviewed and are  negative  Cardiovascular: '[]'  chest pain/pressure '[]'  palpitations '[]'  SOB lying flat '[]'  DOE '[]'  pain in legs while walking '[]'  pain in legs at rest '[]'  pain in legs at night '[]'  non-healing ulcers '[]'  hx of DVT '[]'  swelling in legs  Pulmonary: '[]'  productive cough '[]'  asthma/wheezing '[]'  home O2  Neurologic: '[]'  weakness in '[]'  arms '[]'  legs '[]'  numbness in '[]'  arms '[]'  legs '[]'  hx of CVA '[]'  mini stroke '[]' difficulty speaking or slurred speech '[]'  temporary loss of vision in one eye '[]'  dizziness  Hematologic: '[]'  hx of cancer '[]'  bleeding problems '[]'  problems with blood clotting easily  Endocrine:   '[]'  diabetes '[]'  thyroid disease  GI '[]'  vomiting blood '[]'  blood in stool  GU: '[]'  CKD/renal failure '[]'  HD--'[]'  M/W/F or '[]'  T/T/S '[]'  burning with urination '[]'  blood in urine  Psychiatric: '[]'  anxiety '[]'  depression  Musculoskeletal: '[]'  arthritis '[]'  joint pain  Integumentary: '[]'  rashes '[]'  ulcers  Constitutional: '[]'  fever '[]'  chills   Physical Examination  Vitals:   11/24/19 2200 11/25/19 0000  BP: 116/87 113/76  Pulse: (!) 107   Resp: (!) 24 15  Temp:  98.6 F (37 C)  SpO2: 100% 100%   Body mass index is 17.64 kg/m.  General:  NAD Gait: Not observed HENT: WNL, normocephalic Pulmonary: normal non-labored breathing, without Rales, rhonchi,  wheezing Cardiac: regular, without  Murmurs, rubs or gallops Abdomen: soft, right lower quadrant tenderness, no rebound or guarding Vascular Exam/Pulses: She has some tenderness above the right inguinal ligament but no large hematoma appreciable on exam. Right femoral pulse palpable Right dorsalis pedis palpable Right foot warm Extremities: without ischemic changes, without Gangrene , without cellulitis; without open wounds;  Musculoskeletal: no muscle wasting or atrophy  Neurologic: A&O X 3; Appropriate Affect ; SENSATION: normal; MOTOR FUNCTION:  moving all extremities equally. Speech is fluent/normal   CBC    Component Value  Date/Time   WBC 3.6 (L) 11/25/2019 0134   RBC 2.87 (L) 11/25/2019 0134   HGB 9.3 (L) 11/25/2019 0134   HCT 27.6 (L) 11/25/2019 0134   PLT 129 (L) 11/25/2019 0134   MCV 96.2 11/25/2019 0134   MCH 32.4 11/25/2019 0134   MCHC 33.7 11/25/2019 0134   RDW 14.4 11/25/2019 0134   LYMPHSABS 0.9 11/24/2019 0525   MONOABS 1.3 (H) 11/24/2019 0525   EOSABS 0.0 11/24/2019 0525   BASOSABS 0.0 11/24/2019 0525    BMET    Component Value Date/Time   NA 139 11/25/2019 0134   K 3.2 (L) 11/25/2019 0134   CL 111 11/25/2019 0134   CO2 17 (L) 11/25/2019 0134   GLUCOSE 79 11/25/2019 0134   BUN 19 11/25/2019 0134   CREATININE 0.86 11/25/2019 0134   CALCIUM 8.1 (L)  11/25/2019 0134   GFRNONAA >60 11/25/2019 0134   GFRAA >60 11/25/2019 0134    COAGS: Lab Results  Component Value Date   INR 1.1 11/25/2019     Non-Invasive Vascular Imaging:    I reviewed CT abdomen pelvis and there does appear to be a right pelvic sidewall hematoma/RP hematoma.  I do not see any active extravasation off the right femoral or iliac vessels (although not a dedicated CTA).  No evidence of pseudoaneurysm.  No evidence of dissection.   ASSESSMENT/PLAN: This is a 34 y.o. female that vascular surgery has been consulted for right pelvic sidewall/RP hematoma after percutaneous right femoral access for cardiac cath yesterday.  I do not see any active extravasation on the CT scan with no evidence of femoral pseudoaneurysm no dissection etc.  I would keep her on bedrest.  I would trend her hemoglobin every 6 hours.  Holding heparin for now (DVT prophylaxis).  I discussed keeping her n.p.o. but she got very upset and states she would leave AMA.  Discussed with her in detail certainly if she profoundly drops her hemoglobin or became hemodynamically unstable or had ongoing bleeding would potentially require operative intervention in the OR.     Marty Heck, MD [facility] Office:  [place]

## 2019-11-25 NOTE — Progress Notes (Signed)
  Echocardiogram 2D Echocardiogram has been performed.  Tye Savoy 11/25/2019, 10:34 AM

## 2019-11-25 NOTE — Progress Notes (Signed)
eLink Physician-Brief Progress Note Patient Name: Madison Williams DOB: 05-27-1986 MRN: [id number]   Date of Service  11/25/2019  HPI/Events of Note  Pt c/o right lower quadrant abdominal pain with mild guarding on remote exam via camera. CTAP shows large right retro-peritoneal hematoma in the context of suspected injury to the right femoral artery.  eICU Interventions  Vascular surgery consulted, (Dr. Kemper Durie). I also notified Dr. Clifton James via secure chat, Type and Screen, serial H and H,  PT-INR,PTT.        Thomasene Lot Dixie Jafri 11/25/2019, 12:44 AM

## 2019-11-25 NOTE — Plan of Care (Signed)
  Problem: Clinical Measurements: Goal: Diagnostic test results will improve Outcome: Progressing   Problem: Clinical Measurements: Goal: Cardiovascular complication will be avoided Outcome: Progressing   Problem: Nutrition: Goal: Adequate nutrition will be maintained Outcome: Progressing   Problem: Pain Managment: Goal: General experience of comfort will improve Outcome: Progressing

## 2019-11-25 NOTE — Progress Notes (Signed)
Hypoglycemic Event  CBG: 61  Treatment: 4 oz of Orange Juice  Symptoms:Lightheadness  Follow-up CBG: Time: 0511 CBG Result:75  Possible Reasons for Event: Nausea and Vomiting. Inadequate nutritional intake   Madison Williams

## 2019-11-25 NOTE — Progress Notes (Addendum)
Progress Note  Patient Name: Madison Williams Date of Encounter: 11/25/2019  Primary Cardiologist: new; Dr. Sanjuana Kava  Subjective   No chest pain; nausea is present  Inpatient Medications    Scheduled Meds: . aspirin  325 mg Oral Daily  . Chlorhexidine Gluconate Cloth  6 each Topical Daily  . insulin aspart  2-6 Units Subcutaneous Q4H  . insulin glargine  25 Units Subcutaneous Daily  . mouth rinse  15 mL Mouth Rinse BID  . pantoprazole (PROTONIX) IV  40 mg Intravenous QHS  . potassium chloride  40 mEq Oral Once  . sodium chloride flush  3 mL Intravenous Q12H   Continuous Infusions: . sodium chloride Stopped (11/23/19 2307)  . sodium chloride    . cefTRIAXone (ROCEPHIN)  IV 2 g (11/24/19 1951)  . dextrose 5 % and 0.45% NaCl 75 mL/hr at 11/24/19 0524  . nitroGLYCERIN 40 mcg/min (11/24/19 1341)   PRN Meds: sodium chloride, acetaminophen, acetaminophen, albuterol, dextrose, docusate sodium, metoprolol tartrate, ondansetron (ZOFRAN) IV, polyethylene glycol, promethazine, sodium chloride flush   Vital Signs    Vitals:   11/25/19 0424 11/25/19 0500 11/25/19 0600 11/25/19 0700  BP:  125/89 (!) 139/97 (!) 134/95  Pulse:  83 78 86  Resp:  (!) 31 (!) 26 (!) 22  Temp: 98.1 F (36.7 C)     TempSrc: Oral     SpO2:  100% 100% 100%  Weight:  52.7 kg    Height:        Intake/Output Summary (Last 24 hours) at 11/25/2019 0753 Last data filed at 11/25/2019 0700 Gross per 24 hour  Intake 1066.69 ml  Output --  Net 1066.69 ml    I/O since admission:  +1714  Filed Weights   11/23/19 1344 11/24/19 0500 11/25/19 0500  Weight: 54.4 kg 51.1 kg 52.7 kg    Telemetry    Sinus in the 70s- Personally Reviewed  ECG    ECG (independently read by me): NSR at 84; no significant STT changes: QTc 482 msec  11/24/2019 ECG (independently read by me): Normal sinus rhythm at 88 bpm with ST elevation inferiorly, I avL and in leads V3 through V6.  QTc interval 46 ms with T wave  inversion V2-3.  Physical Exam    BP (!) 134/95   Pulse 86   Temp 98.1 F (36.7 C) (Oral)   Resp (!) 22   Ht 5\' 7"  (1.702 m)   Wt 52.7 kg   LMP 03/16/2018   SpO2 100%   BMI 18.20 kg/m  General: Alert, oriented, no distress.  Skin: normal turgor, no rashes, warm and dry; several tattoos HEENT: Normocephalic, atraumatic. Pupils equal round and reactive to light; sclera anicteric; extraocular muscles intact;  Nose without nasal septal hypertrophy Mouth/Parynx benign;  Neck: No JVD, no carotid bruits; normal carotid upstroke Lungs: clear to ausculatation and percussion; no wheezing or rales Chest wall: without tenderness to palpitation Heart: PMI not displaced, RRR, s1 s2 normal, 1/6 systolic murmur, no diastolic murmur, no rubs, gallops, thrills, or heaves Abdomen: R lower guarding; soft, nontender; no hepatosplenomehaly, BS+; abdominal aorta nontender and not dilated by palpation. Back: no CVA tenderness Pulses 2+; no obvious external hematoma Musculoskeletal: full range of motion, normal strength, no joint deformities Extremities: no clubbing cyanosis or edema, Homan's sign negative  Neurologic: grossly nonfocal; Cranial nerves grossly wnl Psychologic: Normal mood and affect   Labs    Chemistry Recent Labs  Lab 11/23/19 2118 11/24/19 11/26/19 11/24/19 2309 11/25/19 0134 11/25/19 0541  NA 142   < > 140 139 143  K 3.5   < > 3.3* 3.2* 3.0*  CL 113*   < > 112* 111 114*  CO2 13*   < > 17* 17* 18*  GLUCOSE 506*   < > 128* 79 87  BUN 35*   < > 23* 19 16  CREATININE 1.94*   < > 0.96 0.86 0.89  CALCIUM 8.5*   < > 7.9* 8.1* 8.3*  PROT 6.1*  --   --   --   --   ALBUMIN 3.4*  --   --   --   --   AST 34  --   --   --   --   ALT 44  --   --   --   --   ALKPHOS 95  --   --   --   --   BILITOT 1.7*  --   --   --   --   GFRNONAA 33*   < > >60 >60 >60  GFRAA 39*   < > >60 >60 >60  ANIONGAP 16*   < > 11 11 11    < > = values in this interval not displayed.      Hematology Recent Labs  Lab 11/23/19 1154 11/23/19 1206 11/24/19 0525 11/25/19 0134 11/25/19 0541  WBC 14.3*  --  8.8 3.6*  --   RBC 4.10  --  3.93 2.87*  --   HGB 13.7   < > 13.0 9.3* 9.8*  HCT 46.5*   < > 38.2 27.6* 29.4*  MCV 113.4*  --  97.2 96.2  --   MCH 33.4  --  33.1 32.4  --   MCHC 29.5*  --  34.0 33.7  --   RDW 13.8  --  13.6 14.4  --   PLT 232  --  147* 129*  --    < > = values in this interval not displayed.    Cardiac EnzymesNo results for input(s): TROPONINI in the last 168 hours. No results for input(s): TROPIPOC in the last 168 hours.   BNPNo results for input(s): BNP, PROBNP in the last 168 hours.   DDimer No results for input(s): DDIMER in the last 168 hours.   Lipid Panel  No results found for: CHOL, TRIG, HDL, CHOLHDL, VLDL, LDLCALC, LDLDIRECT   Radiology    CT ABDOMEN PELVIS W CONTRAST  Result Date: 11/24/2019 CLINICAL DATA:  Right lower quadrant pain. Status post cardiac catheterization. DKA. EXAM: CT ABDOMEN AND PELVIS WITH CONTRAST TECHNIQUE: Multidetector CT imaging of the abdomen and pelvis was performed using the standard protocol following bolus administration of intravenous contrast. CONTRAST:  11/26/2019 OMNIPAQUE IOHEXOL 300 MG/ML  SOLN COMPARISON:  None. FINDINGS: LOWER CHEST: Normal. HEPATOBILIARY: Normal hepatic contours. No intra- or extrahepatic biliary dilatation. The gallbladder is normal. Small amount of fluid along the inferior hepatic margin. PANCREAS: Normal pancreas. No ductal dilatation or peripancreatic fluid collection. SPLEEN: Normal. ADRENALS/URINARY TRACT: The adrenal glands are normal. No hydronephrosis, nephroureterolithiasis or solid renal mass. The urinary bladder is normal for degree of distention STOMACH/BOWEL: There is no hiatal hernia. Normal duodenal course and caliber. No small bowel dilatation or inflammation. No focal colonic abnormality. Normal appendix. VASCULAR/LYMPHATIC: There is a large hematoma of the right pelvic side  wall extending from the right femoral. The hematoma measures approximately 7.5 x 3 cm and displaces the urinary bladder and uterus to the left. This also extends along the right posterior retroperitoneum.  No abdominal or pelvic lymphadenopathy. REPRODUCTIVE: Uterus is normal. No adnexal mass. MUSCULOSKELETAL. No bony spinal canal stenosis or focal osseous abnormality. OTHER: None. IMPRESSION: 1. Large hematoma of the right pelvic side wall extending from the right femoral artery and along the right lower quadrant retroperitoneum. No active extravasation is seen, but this is a non-angiographic study. 2. Small amount of fluid along the inferior hepatic margin and extending inferiorly toward pelvis. Critical Value/emergent results were called by telephone at the time of interpretation on 11/24/2019 at 11:36 pm to provider Juanetta Snow , who verbally acknowledged these results. Electronically Signed   By: Deatra Robinson M.D.   On: 11/24/2019 23:37   CARDIAC CATHETERIZATION  Result Date: 11/24/2019  There is mild to moderate left ventricular systolic dysfunction.  LV end diastolic pressure is normal.  The left ventricular ejection fraction is 35-45% by visual estimate.  There is no mitral valve regurgitation.  1. No angiographic evidence of CAD 2. Moderate segmental LV systolic dysfunction It is not entirely clear what is causing her elevated troponin and chest pain. No evidence of coronary dissection. Excellent flow down all vessels with no atherosclerotic disease. This could represent a stress induced cardiomyopathy but generally we would not see a troponin level over 27,000 in the setting of stress induced cardiomyopathy. Possible coronary spasm or resolved thrombus. Also cannot exclude PE. Admit to the ICU under PCCM service for continued management of DKA.   DG CHEST PORT 1 VIEW  Result Date: 11/23/2019 CLINICAL DATA:  Short of breath, fever, diabetic ketoacidosis EXAM: PORTABLE CHEST 1 VIEW COMPARISON:   10/16/2019 FINDINGS: Single frontal view of the chest demonstrates a stable cardiac silhouette. Chronic interstitial scarring throughout the lungs without airspace disease, effusion, or pneumothorax. No acute bony abnormalities. IMPRESSION: 1. No acute intrathoracic process. Electronically Signed   By: Sharlet Salina M.D.   On: 11/23/2019 19:22    Cardiac Studies    There is mild to moderate left ventricular systolic dysfunction.  LV end diastolic pressure is normal.  The left ventricular ejection fraction is 35-45% by visual estimate.  There is no mitral valve regurgitation.   1. No angiographic evidence of CAD 2. Moderate segmental LV systolic dysfunction  It is not entirely clear what is causing her elevated troponin and chest pain. No evidence of coronary dissection. Excellent flow down all vessels with no atherosclerotic disease. This could represent a stress induced cardiomyopathy but generally we would not see a troponin level over 27,000 in the setting of stress induced cardiomyopathy. Possible coronary spasm or resolved thrombus. Also cannot exclude PE.  Admit to the ICU under PCCM service for continued management of DKA.    Intervention    Patient Profile     Madison Williams is a 34 y.o. female with a hx of RA, tobacco abuse, DM, and asthma who was seen on 11/24/2019 for the evaluation of chest pain in the setting of DKA and troponin >27,000.   Assessment & Plan    1. Day 1 s/p ACS: I have personally from the emergent catheterization done yesterday morning .  The initial image raises concern for mid distal apical LAD vasospasm attributing to her wall motion abnormality probable earlier transient total occlusion and improvement in spasm.  Subsequent pictures show the distal LAD under significantly improved from the initial image.  ECG yesterday was consistent with a lateral STEMI and ECG today shows no ST segment changes with resolution of ST elevation.  I suspect her  mid distal LAD  wall motion abnormality most likely will be stunned myocardium rather than a reversible scar.  A 2D echo Doppler study will be obtained today for further assessment.  Reduce aspirin to 81 mg.  2.  Essential hypertension: Blood pressure today is elevated at upper 130s to 449Q with diastolics in the 75F.  Will add amlodipine particularly with concern for vasospasm.  If beta-blocker is necessary would use carvedilol due to less potential vasospasm association we will not add presently.  Also with diabetes mellitus, if renal function stabilizes and there is residual LV dysfunction she may benefit from initiation of ARB therapy but will not start presently.  3.  DKA: Patient admits to diabetes for 3 to 4 years.  Upon presentation yesterday she was volume depleted BUN 35 creatinine 1.94 and glucose 506.  He was initially treated with IV insulin is now on Lantus and sliding scale.  4.  Right lower abdomen discomfort: CT scan   shows a large hematoma of the right pelvic sidewall extending from the right femoral artery along the right lower quadrant retroperitoneum.  She was seen by vascular surgery.  Hb today is stable.       5.  Evaluate for hyperlipidemia: We will send off fasting lipid panel today.  6.  Hypokalemia: Potassium 3.0 today.  K replete.  Check magnesium level  7.  AKI: Initial creatinine 1.95.  Improved with hydration today at 0.86 earlier.  8.  Initial marked macrocytosis with MCV 113:     Repeat 97.  Consider checking B12 folate levels.  9.  Tobacco abuse: Discussed the absolute importance of smoking cessation.  I discussed the vasoconstrictor effects of nicotine which may have been implicated in her vasospasm particularly in the setting of volume depletion  Time spent: 40 minutes  Signed, Troy Sine, MD, [facility] 11/25/2019, 7:53 AM

## 2019-11-25 NOTE — Progress Notes (Signed)
Inpatient Diabetes Program Recommendations  AACE/ADA: New Consensus Statement on Inpatient Glycemic Control (2015)  Target Ranges:  Prepandial:   less than 140 mg/dL      Peak postprandial:   less than 180 mg/dL (1-2 hours)      Critically ill patients:  140 - 180 mg/dL   Lab Results  Component Value Date   GLUCAP 109 (H) 11/25/2019   HGBA1C 10.8 (H) 10/17/2019    Review of Glycemic Control Results for Madison Williams, Madison Williams (MRN [id number]) as of 11/25/2019 10:54  Ref. Range 11/24/2019 22:37 11/24/2019 23:14 11/25/2019 04:27 11/25/2019 05:11 11/25/2019 07:53  Glucose-Capillary Latest Ref Range: 70 - 99 mg/dL 080 (H) 223 (H) 61 (L) 75 109 (H)   Diabetes history: DM type 1 Outpatient Diabetes medications: Lantus 25 units Daily, Novolog 3 units tid Current orders for Inpatient glycemic control:  Lantus 25 units daily, Novolog 2-4-6 q 4 hours  Inpatient Diabetes Program Recommendations:    Note that patient transitioned off insulin drip on 4/26 and received Lantus 25 units at 1952.  She received Lantus 25 units this AM at 1000 am also.  May need to watch closely for low blood sugars.  Sent message to MD and RN-? Need to add Dextrose to IV fluids.  Will follow.   Thanks Beryl Meager, RN, BC-ADM Inpatient Diabetes Coordinator Pager [phone]

## 2019-11-25 NOTE — Progress Notes (Addendum)
NAME:  Madison Williams, MRN:  [id number], DOB:  March 24, 1986, LOS: 2 ADMISSION DATE:  11/23/2019, CONSULTATION DATE:  11/23/2018 REFERRING MD:  Dr Marlon Pel, CHIEF COMPLAINT:  DKA   Brief History   34 year old female with a PMHx of Type 1 Diabetes, Asthma who presented with evidence of DKA. Complicated by STEMI likely 2/2 coronary vasospasm, further complicated by retroperitoneal bleed  History of present illness   Feeling poorly for couple of days Not able to keep anything down Some abdominal discomfort Denies dysuria Denies contact with anybody with a febrile illness Has been admitted recently for DKA  Past Medical History   Past Medical History:  Diagnosis Date  . Asthma   . Diabetes mellitus without complication ([place])   . Rheumatoid arthritis ([place])     Significant Hospital Events   4/25: Admitted for DKA 4/26: LHC due to ST elevations on EKG  Consults:  Cardiology, Vascular Surgery  Procedures:  4/26 - LHC  Significant Diagnostic Tests:   LHC (4/26)  There is mild to moderate left ventricular systolic dysfunction.  LV end diastolic pressure is normal.  The left ventricular ejection fraction is 35-45% by visual estimate.  There is no mitral valve regurgitation. 1. No angiographic evidence of CAD 2. Moderate segmental LV systolic dysfunction  CT A/P (4/26)  1. Large hematoma of the right pelvic side wall extending from the right femoral artery and along the right lower quadrant retroperitoneum. No active extravasation is seen, but this is a non-angiographic study. 2. Small amount of fluid along the inferior hepatic margin and extending inferiorly toward pelvis.  Micro Data:  Blood cultures 4/25 > NGTD at 2 days   Antimicrobials:  Rocephin 4/25>>  Interim history/subjective:   Overnight, patient was noted to have abdominal pain with a bulge noted at the site of LHC. CT abdomen obtained and show RP bleed 2/2 injury of femoral artery.   This AM,  patient denies any additional chest pain, abdominal pain, SOB. Overall, she feels tired.   She states prior to admission, she was experiencing chronic nausea that is sometimes alleviated by Phenergan. Her daughter also had a GI bug that "she must have picked up too" as she was having abdominal pain and diarrhea.   Objective   Blood pressure (!) 134/95, pulse 86, temperature 98.1 F (36.7 C), temperature source Oral, resp. rate (!) 22, height 5\' 7"  (1.702 m), weight 52.7 kg, last menstrual period 03/16/2018, SpO2 100 %.        Intake/Output Summary (Last 24 hours) at 11/25/2019 0734 Last data filed at 11/25/2019 0700 Gross per 24 hour  Intake 1066.69 ml  Output --  Net 1066.69 ml   Filed Weights   11/23/19 1344 11/24/19 0500 11/25/19 0500  Weight: 54.4 kg 51.1 kg 52.7 kg   Physical Exam Vitals and nursing note reviewed.  Constitutional:      General: She is not in acute distress.    Appearance: She is normal weight.  HENT:     Head: Normocephalic and atraumatic.  Cardiovascular:     Rate and Rhythm: Normal rate and regular rhythm.     Heart sounds: No murmur.  Pulmonary:     Effort: No respiratory distress.     Breath sounds: No wheezing, rhonchi or rales.  Abdominal:     General: There is no distension.     Palpations: Abdomen is soft.     Tenderness: There is no abdominal tenderness.  Skin:    General: Skin is  warm and dry.     Coloration: Skin is pale.     Findings: Bruising (Right groin near LHC site) present.  Neurological:     General: No focal deficit present.     Mental Status: She is alert and oriented to person, place, and time. Mental status is at baseline.  Psychiatric:        Mood and Affect: Mood normal.        Behavior: Behavior normal.    Resolved Hospital Problem list   DKA  Assessment & Plan:   # Diabetic Ketoacidosis (resolved) # Type 1 Diabetes Mellitus  Over insulin gtt and gap has remained closed. Patient has been having some hypoglycemia  due to poor PO intake though.  Plan:  - SSI q4h - Lantus 25 units daily - Consider adding ACEi/ARB in the future  # Acute Anterolateral STEMI 2/2 Coronary Vasospasm # Stress-induced Cardiomyopathy  Cardiology on board. Discussed case this AM with Dr. Tresa Endo who feels ST elevations most likely secondary to coronary vasospasm given how quickly there was recovery in coronary flow on LHC. He is recommending starting Amlodipine for both BP control and to prevent future vasospasms.  Hyperlipidemia noted on lipid panel.  Plan:  - Cardiology following; appreciate their recommendations  - TTE pending  - Amlodipine 5 mg - Aspirin 81 mg  - Consider adding statin - Recommended tobacco use cessation  # Retroperitoneal Bleed # Large pelvic wall hematoma  Originating from the right femoral artery. Hemoglobin stable at this time, no indications for surgery. Will very closely monitor.  Plan:  - Vascular surgery following; appreciate their recommendations   # Acute kidney injury # Hypokalemia  Secondary to hypovolemic due to DKA. Resolving at this time.  Plan:  - Continue to trend creatinine and monitor urine output - Monitor and replenish electrolytes PRN (receiving 40 mEq of KCl today)  # Fever  Resolved. Blood cultures are NGTD. Question need for continued antibiotics at this time.  Plan:  - Will discuss discontinuing antibiotics  Best practice:  Diet: Carb-modified Pain/Anxiety/Delirium protocol (if indicated): Not applicable VAP protocol (if indicated): Not applicable DVT prophylaxis: SCDs (Heparin contraindicated due to RP bleed) GI prophylaxis: Protonix Glucose control: SSI + Lantus  Mobility: Bedrest Code Status: Full code Family Communication: Patient informed about ongoing events Disposition: ICU  Labs   CBC: Recent Labs  Lab 11/23/19 1154 11/23/19 1206 11/24/19 0525 11/25/19 0134 11/25/19 0541  WBC 14.3*  --  8.8 3.6*  --   NEUTROABS  --   --  6.5  --   --   HGB  13.7 15.6* 13.0 9.3* 9.8*  HCT 46.5* 46.0 38.2 27.6* 29.4*  MCV 113.4*  --  97.2 96.2  --   PLT 232  --  147* 129*  --     Basic Metabolic Panel: Recent Labs  Lab 11/23/19 2118 11/23/19 2118 11/24/19 0525 11/24/19 1420 11/24/19 1847 11/24/19 2309 11/25/19 0134  NA 142   < > 146* 144 143 140 139  K 3.5   < > 3.3* 3.5 3.5 3.3* 3.2*  CL 113*   < > 116* 115* 112* 112* 111  CO2 13*   < > 20* 15* 18* 17* 17*  GLUCOSE 506*   < > 197* 169* 187* 128* 79  BUN 35*   < > 32* 28* 26* 23* 19  CREATININE 1.94*   < > 1.46* 1.23* 1.06* 0.96 0.86  CALCIUM 8.5*   < > 8.5* 8.2* 8.0* 7.9* 8.1*  MG 2.3  --   --   --   --   --   --   PHOS 2.1*  --   --   --   --   --   --    < > = values in this interval not displayed.   GFR: Estimated Creatinine Clearance: 78.1 mL/min (by C-G formula based on SCr of 0.86 mg/dL). Recent Labs  Lab 11/23/19 1154 11/23/19 1702 11/24/19 0525 11/25/19 0134  WBC 14.3*  --  8.8 3.6*  LATICACIDVEN  --  2.2* 1.4  --     Liver Function Tests: Recent Labs  Lab 11/23/19 2118  AST 34  ALT 44  ALKPHOS 95  BILITOT 1.7*  PROT 6.1*  ALBUMIN 3.4*   Recent Labs  Lab 11/23/19 2118  LIPASE 26  AMYLASE 90   No results for input(s): AMMONIA in the last 168 hours.  ABG    Component Value Date/Time   PHART 7.197 (LL) 11/23/2019 1630   PCO2ART <19.0 (LL) 11/23/2019 1630   PO2ART 117 (H) 11/23/2019 1630   HCO3 NOT CALCULATED 11/23/2019 1630   TCO2 8 (L) 11/23/2019 1206   ACIDBASEDEF NOT CALCULATED 11/23/2019 1630   O2SAT 96.9 11/23/2019 1630     Coagulation Profile: Recent Labs  Lab 11/25/19 0134  INR 1.1    Cardiac Enzymes: No results for input(s): CKTOTAL, CKMB, CKMBINDEX, TROPONINI in the last 168 hours.  HbA1C: Hgb A1c MFr Bld  Date/Time Value Ref Range Status  10/17/2019 05:00 AM 10.8 (H) 4.8 - 5.6 % Final    Comment:    (NOTE) Pre diabetes:          5.7%-6.4% Diabetes:              >6.4% Glycemic control for   <7.0% adults with  diabetes   04/02/2019 11:55 PM 11.1 (H) 4.8 - 5.6 % Final    Comment:    (NOTE) Pre diabetes:          5.7%-6.4% Diabetes:              >6.4% Glycemic control for   <7.0% adults with diabetes     CBG: Recent Labs  Lab 11/24/19 2110 11/24/19 2237 11/24/19 2314 11/25/19 0427 11/25/19 0511  GLUCAP 153* 162* 124* 61* 75    Review of Systems:   Active smoker She does use some illicit over-the-counter' opiates'  Past Medical History  She,  has a past medical history of Asthma, Diabetes mellitus without complication ([facility]), and Rheumatoid arthritis ([facility]).   Surgical History    Past Surgical History:  Procedure Laterality Date  . LAPAROSCOPIC OVARIAN    . LEFT HEART CATH AND CORONARY ANGIOGRAPHY N/A 11/24/2019   Procedure: LEFT HEART CATH AND CORONARY ANGIOGRAPHY;  Surgeon: Kathleene Hazel, MD;  Location: [facility];  Service: Cardiovascular;  Laterality: N/A;     Social History   reports that she has been smoking. She has been smoking about 1.00 pack per day. She has never used smokeless tobacco. She reports current alcohol use. She reports that she does not use drugs.   Family History   Her family history is not on file.   Allergies No Known Allergies   Home Medications  Prior to Admission medications   Medication Sig Start Date End Date Taking? Authorizing Provider  albuterol (PROVENTIL HFA;VENTOLIN HFA) 108 (90 Base) MCG/ACT inhaler Inhale 2 puffs into the lungs every 6 (six) hours as needed for wheezing or shortness of breath.  [provider]  insulin aspart (NOVOLOG) 100 UNIT/ML injection Inject 3 Units into the skin 3 (three) times daily with meals. 10/19/19   Zannie Cove, MD  insulin glargine (LANTUS) 100 UNIT/ML injection Inject 0.25 mLs (25 Units total) into the skin daily. 10/19/19   Zannie Cove, MD  nicotine (NICODERM CQ - DOSED IN MG/24 HOURS) 14 mg/24hr patch Place 1 patch (14 mg total) onto the skin daily. Patient not  taking: Reported on 01/30/2019 04/01/18   Rolly Salter, MD  promethazine (PHENERGAN) 12.5 MG tablet Take 1 tablet (12.5 mg total) by mouth every 6 (six) hours as needed for nausea or vomiting. 10/19/19   Zannie Cove, MD     Dr. Verdene Lennert Internal Medicine PGY-1  Pager: [phone] 11/25/2019, 7:34 AM

## 2019-11-25 NOTE — Progress Notes (Signed)
Nutrition Brief Note  Patient identified on the Malnutrition Screening Tool (MST) Report  Wt Readings from Last 15 Encounters:  11/25/19 52.7 kg  10/19/19 54 kg  04/05/19 52.8 kg  03/17/19 56.9 kg  01/30/19 53.1 kg  03/30/18 59 kg    Body mass index is 18.2 kg/m.  Pt denies weight loss  Current diet order is Carb Modified. Pt currently nauseous but able to take some po. Pt reports good appetite prior to admission.    Labs and medications reviewed.   Pt reports she as plenty of insulin at home. Pt reports she does not have any questions about diabetic diet. Pt reports chronic nausea, takes phenergan at home but recently ran out  No nutrition interventions warranted at this time. If nutrition issues arise, please consult RD.   Romelle Starcher MS, RDN, LDN, CNSC RD Pager Number and RD On-Call Pager Number Located in [place]

## 2019-11-25 NOTE — Progress Notes (Signed)
[facility] [place]  Subjective  -states her right groin remains unchanged and no increased abdominal pain.   Objective (!) 134/95 86 98.1 F (36.7 C) (Oral) (!) 22 100%  Intake/Output Summary (Last 24 hours) at 11/25/2019 0754 Last data filed at 11/25/2019 0700 Gross per 24 hour  Intake 1066.69 ml  Output --  Net 1066.69 ml    Palpable right femoral and DP pulse Tenderness just above right inguinal ligament  Laboratory Lab Results: Recent Labs    11/24/19 0525 11/24/19 0525 11/25/19 0134 11/25/19 0541  WBC 8.8  --  3.6*  --   HGB 13.0   < > 9.3* 9.8*  HCT 38.2   < > 27.6* 29.4*  PLT 147*  --  129*  --    < > = values in this interval not displayed.   BMET Recent Labs    11/25/19 0134 11/25/19 0541  NA 139 143  K 3.2* 3.0*  CL 111 114*  CO2 17* 18*  GLUCOSE 79 87  BUN 19 16  CREATININE 0.86 0.89  CALCIUM 8.1* 8.3*    COAG Lab Results  Component Value Date   INR 1.1 11/25/2019   No results found for: PTT  Assessment/Planning:  34 year old female status post right femoral access for cardiac cath on Sunday.  We were called last night to evaluate right pelvic sidewall hematoma and small RP bleed.  Would continue bedrest.  Continue to trend hemoglobin.  Hemoglobin stable 9.3 to 9.8 this morning.  Hemodynamics look okay.  No change in groin exam.  I would be hopeful that she can avoid any surgical intervention with conservative management.  Cephus Shelling 11/25/2019 7:54 AM --

## 2019-11-25 NOTE — Progress Notes (Signed)
During shift assessment this RN noticed and palpated a bulge in Patient's RLQ.  Patient responded to pain and was guarding when site was palpated or touched. Dr. Warrick Parisian called and notified. CT ordered. Awaiting results.

## 2019-11-26 DIAGNOSIS — Z720 Tobacco use: Secondary | ICD-10-CM

## 2019-11-26 DIAGNOSIS — K661 Hemoperitoneum: Secondary | ICD-10-CM

## 2019-11-26 LAB — CBC
HCT: 30.1 % — ABNORMAL LOW (ref 36.0–46.0)
HCT: 31.7 % — ABNORMAL LOW (ref 36.0–46.0)
HCT: 34 % — ABNORMAL LOW (ref 36.0–46.0)
Hemoglobin: 10.2 g/dL — ABNORMAL LOW (ref 12.0–15.0)
Hemoglobin: 10.2 g/dL — ABNORMAL LOW (ref 12.0–15.0)
Hemoglobin: 11.4 g/dL — ABNORMAL LOW (ref 12.0–15.0)
MCH: 32.8 pg (ref 26.0–34.0)
MCH: 31.5 pg (ref 26.0–34.0)
MCH: 32.9 pg (ref 26.0–34.0)
MCHC: 33.9 g/dL (ref 30.0–36.0)
MCHC: 32.2 g/dL (ref 30.0–36.0)
MCHC: 33.5 g/dL (ref 30.0–36.0)
MCV: 96.8 fL (ref 80.0–100.0)
MCV: 97.8 fL (ref 80.0–100.0)
MCV: 98 fL (ref 80.0–100.0)
Platelets: 111 10*3/uL — ABNORMAL LOW (ref 150–400)
Platelets: 92 10*3/uL — ABNORMAL LOW (ref 150–400)
Platelets: 105 10*3/uL — ABNORMAL LOW (ref 150–400)
RBC: 3.11 MIL/uL — ABNORMAL LOW (ref 3.87–5.11)
RBC: 3.24 MIL/uL — ABNORMAL LOW (ref 3.87–5.11)
RBC: 3.47 MIL/uL — ABNORMAL LOW (ref 3.87–5.11)
RDW: 14.4 % (ref 11.5–15.5)
RDW: 14.5 % (ref 11.5–15.5)
RDW: 14.4 % (ref 11.5–15.5)
WBC: 2.5 10*3/uL — ABNORMAL LOW (ref 4.0–10.5)
WBC: 2.7 10*3/uL — ABNORMAL LOW (ref 4.0–10.5)
WBC: 2.7 10*3/uL — ABNORMAL LOW (ref 4.0–10.5)
nRBC: 0 % (ref 0.0–0.2)
nRBC: 0 % (ref 0.0–0.2)
nRBC: 0 % (ref 0.0–0.2)

## 2019-11-26 LAB — BASIC METABOLIC PANEL
Anion gap: 11 (ref 5–15)
BUN: 7 mg/dL (ref 6–20)
CO2: 21 mmol/L — ABNORMAL LOW (ref 22–32)
Calcium: 8.3 mg/dL — ABNORMAL LOW (ref 8.9–10.3)
Chloride: 104 mmol/L (ref 98–111)
Creatinine, Ser: 0.82 mg/dL (ref 0.44–1.00)
GFR calc Af Amer: >60 mL/min (ref >60)
GFR calc non Af Amer: >60 mL/min (ref >60)
Glucose, Bld: 340 mg/dL — ABNORMAL HIGH (ref 70–99)
Potassium: 3.7 mmol/L (ref 3.5–5.1)
Sodium: 136 mmol/L (ref 135–145)

## 2019-11-26 LAB — GLUCOSE, CAPILLARY
Glucose-Capillary: 194 mg/dL — ABNORMAL HIGH (ref 70–99)
Glucose-Capillary: 303 mg/dL — ABNORMAL HIGH (ref 70–99)
Glucose-Capillary: 291 mg/dL — ABNORMAL HIGH (ref 70–99)
Glucose-Capillary: 134 mg/dL — ABNORMAL HIGH (ref 70–99)
Glucose-Capillary: 217 mg/dL — ABNORMAL HIGH (ref 70–99)
Glucose-Capillary: 182 mg/dL — ABNORMAL HIGH (ref 70–99)

## 2019-11-26 LAB — MAGNESIUM: Magnesium: 2.1 mg/dL (ref 1.7–2.4)

## 2019-11-26 MED ORDER — OXYCODONE HCL 5 MG/5ML PO SOLN
5.0000 mg | Freq: Four times a day (QID) | ORAL | Status: DC | PRN
  Administered 2019-11-26 – 2019-11-29 (×11): 5 mg via ORAL
  Filled 2019-11-26 (×13): qty 5

## 2019-11-26 MED ORDER — INSULIN GLARGINE 100 UNIT/ML ~~LOC~~ SOLN
25.0000 [IU] | Freq: Every day | SUBCUTANEOUS | Status: DC
  Administered 2019-11-26: 10:00:00 25 [IU] via SUBCUTANEOUS
  Filled 2019-11-26: qty 0.25

## 2019-11-26 MED ORDER — INSULIN ASPART 100 UNIT/ML ~~LOC~~ SOLN
2.0000 [IU] | Freq: Three times a day (TID) | SUBCUTANEOUS | Status: DC
  Administered 2019-11-26 (×2): 6 [IU] via SUBCUTANEOUS
  Administered 2019-11-26: 22:00:00 4 [IU] via SUBCUTANEOUS
  Administered 2019-11-26 – 2019-11-27 (×3): 6 [IU] via SUBCUTANEOUS

## 2019-11-26 MED ORDER — INSULIN GLARGINE 100 UNIT/ML ~~LOC~~ SOLN
30.0000 [IU] | Freq: Every day | SUBCUTANEOUS | Status: DC
  Administered 2019-11-27 – 2019-11-28 (×2): 30 [IU] via SUBCUTANEOUS
  Filled 2019-11-26 (×2): qty 0.3

## 2019-11-26 MED ORDER — PANTOPRAZOLE SODIUM 40 MG PO TBEC
40.0000 mg | DELAYED_RELEASE_TABLET | Freq: Every day | ORAL | Status: DC
  Administered 2019-11-26 – 2019-11-28 (×3): 40 mg via ORAL
  Filled 2019-11-26 (×3): qty 1

## 2019-11-26 MED ORDER — CARVEDILOL 3.125 MG PO TABS
3.1250 mg | ORAL_TABLET | Freq: Two times a day (BID) | ORAL | Status: DC
  Administered 2019-11-26 – 2019-11-29 (×6): 3.125 mg via ORAL
  Filled 2019-11-26 (×7): qty 1

## 2019-11-26 MED ORDER — PROMETHAZINE HCL 25 MG/ML IJ SOLN
12.5000 mg | Freq: Four times a day (QID) | INTRAMUSCULAR | Status: AC | PRN
  Administered 2019-11-26 – 2019-11-28 (×4): 12.5 mg via INTRAVENOUS
  Filled 2019-11-26 (×4): qty 1

## 2019-11-26 MED ORDER — LIDOCAINE 5 % EX PTCH
1.0000 | MEDICATED_PATCH | CUTANEOUS | Status: DC
  Filled 2019-11-26: qty 1

## 2019-11-26 MED ORDER — POTASSIUM CHLORIDE 20 MEQ PO PACK: 40.0000 meq | PACK | Freq: Once | ORAL | Status: DC

## 2019-11-26 MED ORDER — MORPHINE SULFATE (PF) 2 MG/ML IV SOLN
2.0000 mg | Freq: Once | INTRAVENOUS | Status: AC
  Administered 2019-11-26: 03:00:00 2 mg via INTRAVENOUS
  Filled 2019-11-26: qty 1

## 2019-11-26 MED ORDER — INSULIN ASPART 100 UNIT/ML ~~LOC~~ SOLN
3.0000 [IU] | Freq: Three times a day (TID) | SUBCUTANEOUS | Status: DC
  Administered 2019-11-26: 12:00:00 3 [IU] via SUBCUTANEOUS

## 2019-11-26 MED ORDER — METHADONE HCL 10 MG PO TABS: 40.0000 mg | ORAL_TABLET | Freq: Two times a day (BID) | ORAL | Status: DC

## 2019-11-26 MED ORDER — INSULIN ASPART 100 UNIT/ML ~~LOC~~ SOLN
4.0000 [IU] | Freq: Three times a day (TID) | SUBCUTANEOUS | Status: DC
  Administered 2019-11-26 – 2019-11-27 (×3): 4 [IU] via SUBCUTANEOUS

## 2019-11-26 MED ORDER — POTASSIUM CHLORIDE 20 MEQ PO PACK
40.0000 meq | PACK | Freq: Once | ORAL | Status: AC
  Administered 2019-11-26: 10:00:00 40 meq via ORAL
  Filled 2019-11-26: qty 2

## 2019-11-26 MED ORDER — NICOTINE 14 MG/24HR TD PT24
14.0000 mg | MEDICATED_PATCH | Freq: Every day | TRANSDERMAL | Status: DC
  Administered 2019-11-26 – 2019-11-28 (×3): 14 mg via TRANSDERMAL
  Filled 2019-11-26 (×4): qty 1

## 2019-11-26 NOTE — Progress Notes (Signed)
Report called to 4E nurse. Pt's belongings packed up. SWOT RN called to transfer pt up to 4E20 via wheelchair on tele monitoring.

## 2019-11-26 NOTE — Progress Notes (Signed)
eLink Physician-Brief Progress Note Patient Name: Madison Williams DOB: 05-03-1986 MRN: [id number]   Date of Service  11/26/2019  HPI/Events of Note  Pt with abdominal pain related to a pelvic wall / retroperitoneal hematoma.  eICU Interventions  Morphine 2 mg iv x 1        Brunetta Newingham U Hoyt Leanos 11/26/2019, 2:44 AM

## 2019-11-26 NOTE — Progress Notes (Signed)
Inpatient Diabetes Program Recommendations  AACE/ADA: New Consensus Statement on Inpatient Glycemic Control (2015)  Target Ranges:  Prepandial:   less than 140 mg/dL      Peak postprandial:   less than 180 mg/dL (1-2 hours)      Critically ill patients:  140 - 180 mg/dL   Lab Results  Component Value Date   GLUCAP 303 (H) 11/26/2019   HGBA1C 10.8 (H) 10/17/2019   Results for NEILANI, DUFFEE (MRN [id number]) as of 11/26/2019 10:32  Ref. Range 11/25/2019 15:45 11/25/2019 20:08 11/25/2019 23:30 11/26/2019 04:08 11/26/2019 07:33  Glucose-Capillary Latest Ref Range: 70 - 99 mg/dL 97 505 (H) 183 (H) 358 (H) 303 (H)   Review of Glycemic Control Diabetes history:DM type 1 Outpatient Diabetes medications:Lantus 25 units Daily, Novolog 3 units tid Current orders for Inpatient glycemic control: Lantus 25 units daily, Novolog 2-4-6 units tid with meals and HS, Novolog 3 units tid with meals Inpatient Diabetes Program Recommendations:    Please reduce Novolog correction to sensitive tid with meals and HS.  Also consider increasing Lantus to 30 units daily.   Thanks  Beryl Meager, RN, BC-ADM Inpatient Diabetes Coordinator Pager [phone] (8a-5p)

## 2019-11-26 NOTE — Progress Notes (Signed)
Discussed patient with Dr. Thedore Mins with [facility]. [facility] will resume care of patient on 11/27/2019 at 7:00 AM.

## 2019-11-26 NOTE — Progress Notes (Addendum)
  Progress Note    11/26/2019 7:33 AM 2 Days Post-Op  Subjective:  Wants to get out of bed.  Still having pain in R groin and R lower quadrant   Vitals:   11/26/19 0600 11/26/19 0700  BP: (!) 130/97 (!) 133/100  Pulse: 83 94  Resp: (!) 26 17  Temp:    SpO2: 99% 100%   Physical Exam: Lungs:  Non labored Incisions:  R groin cath site without firm hematoma Extremities:  Palpable R DP pulse Abdomen:  Soft, some fullness RLQ Neurologic: a&O  CBC    Component Value Date/Time   WBC 2.5 (L) 11/26/2019 0024   RBC 3.11 (L) 11/26/2019 0024   HGB 10.2 (L) 11/26/2019 0024   HCT 30.1 (L) 11/26/2019 0024   PLT 111 (L) 11/26/2019 0024   MCV 96.8 11/26/2019 0024   MCH 32.8 11/26/2019 0024   MCHC 33.9 11/26/2019 0024   RDW 14.4 11/26/2019 0024   LYMPHSABS 0.9 11/24/2019 0525   MONOABS 1.3 (H) 11/24/2019 0525   EOSABS 0.0 11/24/2019 0525   BASOSABS 0.0 11/24/2019 0525    BMET    Component Value Date/Time   NA 143 11/25/2019 1805   K 3.3 (L) 11/25/2019 1805   CL 110 11/25/2019 1805   CO2 22 11/25/2019 1805   GLUCOSE 126 (H) 11/25/2019 1805   BUN 10 11/25/2019 1805   CREATININE 0.76 11/25/2019 1805   CALCIUM 8.6 (L) 11/25/2019 1805   GFRNONAA >60 11/25/2019 1805   GFRAA >60 11/25/2019 1805    INR    Component Value Date/Time   INR 1.1 11/25/2019 0134     Intake/Output Summary (Last 24 hours) at 11/26/2019 0733 Last data filed at 11/26/2019 0400 Gross per 24 hour  Intake 849.81 ml  Output 650 ml  Net 199.81 ml     Assessment/Plan:  34 y.o. female is s/p R femoral access for cardiac cath 2 Days Post-Op   RLE well perfused with palpable DP pulse R groin cath site is unremarkable and without hematoma Still having RLQ abd pain; continue pain control Hgb stable; continue to monitor Bedrest order discontinued; ok to increase mobility   Madison Rutter, PA-C [facility] [phone] 11/26/2019 7:33 AM  I have seen and evaluated the patient. I  agree with the PA note as documented above. Right pelvic sidewall hematoma and small RP hematoma after femoral accecss for cardiac cath.  Hgb stable over past 24 hours.  Hemodynamics ok.  No active extravasation seen on CT.  OK for OOB and mobilize.  Plan to manage non-operatively unless something changes.    Cephus Shelling, MD [facility] [place] Office: [phone]

## 2019-11-26 NOTE — Progress Notes (Addendum)
NAME:  Madison Williams, MRN:  [id number], DOB:  Jan 02, 1986, LOS: 3 ADMISSION DATE:  11/23/2019, CONSULTATION DATE:  11/23/2018 REFERRING MD:  Dr Marlon Pel, CHIEF COMPLAINT:  DKA   Brief History   34 year old female with a PMHx of Type 1 Diabetes, Asthma who presented with evidence of DKA. Complicated by STEMI likely 2/2 coronary vasospasm, further complicated by retroperitoneal bleed  History of present illness   Feeling poorly for couple of days Not able to keep anything down Some abdominal discomfort Denies dysuria Denies contact with anybody with a febrile illness Has been admitted recently for DKA  Past Medical History   Past Medical History:  Diagnosis Date  . Asthma   . Diabetes mellitus without complication ([place])   . Rheumatoid arthritis ([place])     Significant Hospital Events   4/25: Admitted for DKA 4/26: LHC due to ST elevations on EKG  Consults:  Cardiology, Vascular Surgery  Procedures:  4/26 - LHC  Significant Diagnostic Tests:   LHC (4/26)  There is mild to moderate left ventricular systolic dysfunction.  LV end diastolic pressure is normal.  The left ventricular ejection fraction is 35-45% by visual estimate.  There is no mitral valve regurgitation. 1. No angiographic evidence of CAD 2. Moderate segmental LV systolic dysfunction  CT A/P (4/26)  1. Large hematoma of the right pelvic side wall extending from the right femoral artery and along the right lower quadrant retroperitoneum. No active extravasation is seen, but this is a non-angiographic study. 2. Small amount of fluid along the inferior hepatic margin and extending inferiorly toward pelvis.  Micro Data:  Blood cultures 4/25 > NGTD  Antimicrobials:  Rocephin 4/25>>  Interim history/subjective:   Ms. Hollenkamp states she continues to have abdominal pain, especially in the RLQ where the hematoma is. She also continues to have chronic nausea but feels Reglan has been helping her food  move better. She denies any chest pain or SOB.   Objective   Blood pressure (!) 133/100, pulse 94, temperature 99.3 F (37.4 C), temperature source Oral, resp. rate 17, height 5\' 7"  (1.702 m), weight 52.7 kg, last menstrual period 03/16/2018, SpO2 100 %.        Intake/Output Summary (Last 24 hours) at 11/26/2019 0726 Last data filed at 11/26/2019 0400 Gross per 24 hour  Intake 849.81 ml  Output 650 ml  Net 199.81 ml   Filed Weights   11/23/19 1344 11/24/19 0500 11/25/19 0500  Weight: 54.4 kg 51.1 kg 52.7 kg   Physical Exam Vitals and nursing note reviewed.  Constitutional:      General: She is not in acute distress.    Appearance: She is normal weight.  HENT:     Head: Normocephalic and atraumatic.  Cardiovascular:     Rate and Rhythm: Normal rate and regular rhythm.     Heart sounds: No murmur.  Pulmonary:     Effort: No respiratory distress.     Breath sounds: No wheezing, rhonchi or rales.  Abdominal:     General: There is no distension.     Palpations: Abdomen is soft.     Tenderness: There is no abdominal tenderness.  Skin:    General: Skin is warm and dry.     Coloration: Skin is pale.     Findings: Bruising (Right groin near LHC site, slightly larger than yesterday, soft to palpation) present.  Neurological:     General: No focal deficit present.     Mental Status: She is  alert and oriented to person, place, and time. Mental status is at baseline.  Psychiatric:        Mood and Affect: Mood normal.        Behavior: Behavior normal.    Resolved Hospital Problem list   DKA Acute kidney injury Hyperkalemia   Assessment & Plan:   # Diabetic Ketoacidosis (resolved) # Type 1 Diabetes Mellitus  Hyperglycemic today, so will adjust regimen. Likely due to improved PO intake.  Plan:  - SSI q4h - Lantus 30 units daily - Novolog 4 units TID with meals  - Consider adding ACEi/ARB in the future  # Acute Anterolateral STEMI 2/2 Coronary Vasospasm # Stress-induced  Cardiomyopathy  Continues to be pain free today.  Hyperlipidemia noted on lipid panel.  Plan:  - Cardiology following; appreciate their recommendations  - Amlodipine 5 mg - Aspirin 81 mg  - Coreg 3.125 BID to start today  - Replenish electrolytes to maintain K > 4.0 and Mg > 2.0  - Consider adding statin - Recommended tobacco use cessation. Will supplement with Nicotine patch at this time. - Transferring out of ICU today, keep on telemetry due to new HFrEF  # Retroperitoneal Bleed # Large pelvic wall hematoma  Originating from the right femoral artery. Hemoglobin improving today, no indications for surgery or transfusion at this time. Will very closely monitor.  Plan:  - Vascular surgery following; appreciate their recommendations - Trend hemoglobin - Oxycodone PRN pain, short course only  # Fever  Resolved. Blood cultures are NGTD. Question need for continued antibiotics at this time.  Plan:  - Ceftriaxone discontinued.   # Chronic Nausea Concerning for gastroparesis. Started Reglan trial on 4/27, doing well with it.   P:  - Reglan 5 mg q8h  Best practice:  Diet: Carb-modified Pain/Anxiety/Delirium protocol (if indicated): Not applicable VAP protocol (if indicated): Not applicable DVT prophylaxis: SCDs (Heparin contraindicated due to RP bleed) GI prophylaxis: Protonix Glucose control: SSI + Lantus  Mobility: OOB as tolerated  Code Status: Full code Family Communication: Patient informed about ongoing events Disposition: Transfer to floor.  Labs   CBC: Recent Labs  Lab 11/24/19 0525 11/24/19 0525 11/25/19 0134 11/25/19 0134 11/25/19 0541 11/25/19 0920 11/25/19 1416 11/25/19 1843 11/26/19 0024  WBC 8.8  --  3.6*  --   --   --  3.1* 3.0* 2.5*  NEUTROABS 6.5  --   --   --   --   --   --   --   --   HGB 13.0   < > 9.3*   < > 9.8* 9.6* 10.3* 10.8* 10.2*  HCT 38.2   < > 27.6*   < > 29.4* 28.4* 30.0* 31.8* 30.1*  MCV 97.2  --  96.2  --   --   --  95.2 96.4 96.8    PLT 147*  --  129*  --   --   --  122* 120* 111*   < > = values in this interval not displayed.    Basic Metabolic Panel: Recent Labs  Lab 11/23/19 2118 11/24/19 0525 11/24/19 1847 11/24/19 2309 11/25/19 0134 11/25/19 0541 11/25/19 1416 11/25/19 1805  NA 142   < > 143 140 139 143  --  143  K 3.5   < > 3.5 3.3* 3.2* 3.0*  --  3.3*  CL 113*   < > 112* 112* 111 114*  --  110  CO2 13*   < > 18* 17* 17* 18*  --  22  GLUCOSE 506*   < > 187* 128* 79 87  --  126*  BUN 35*   < > 26* 23* 19 16  --  10  CREATININE 1.94*   < > 1.06* 0.96 0.86 0.89  --  0.76  CALCIUM 8.5*   < > 8.0* 7.9* 8.1* 8.3*  --  8.6*  MG 2.3  --   --   --   --   --  1.7  --   PHOS 2.1*  --   --   --   --   --   --   --    < > = values in this interval not displayed.   GFR: Estimated Creatinine Clearance: 84 mL/min (by C-G formula based on SCr of 0.76 mg/dL). Recent Labs  Lab 11/23/19 1154 11/23/19 1702 11/24/19 0525 11/24/19 0525 11/25/19 0134 11/25/19 1416 11/25/19 1843 11/26/19 0024  WBC   < >  --  8.8   < > 3.6* 3.1* 3.0* 2.5*  LATICACIDVEN  --  2.2* 1.4  --   --   --   --   --    < > = values in this interval not displayed.    Liver Function Tests: Recent Labs  Lab 11/23/19 2118  AST 34  ALT 44  ALKPHOS 95  BILITOT 1.7*  PROT 6.1*  ALBUMIN 3.4*   Recent Labs  Lab 11/23/19 2118  LIPASE 26  AMYLASE 90   No results for input(s): AMMONIA in the last 168 hours.  ABG    Component Value Date/Time   PHART 7.197 (LL) 11/23/2019 1630   PCO2ART <19.0 (LL) 11/23/2019 1630   PO2ART 117 (H) 11/23/2019 1630   HCO3 NOT CALCULATED 11/23/2019 1630   TCO2 8 (L) 11/23/2019 1206   ACIDBASEDEF NOT CALCULATED 11/23/2019 1630   O2SAT 96.9 11/23/2019 1630     Coagulation Profile: Recent Labs  Lab 11/25/19 0134  INR 1.1    Cardiac Enzymes: No results for input(s): CKTOTAL, CKMB, CKMBINDEX, TROPONINI in the last 168 hours.  HbA1C: Hgb A1c MFr Bld  Date/Time Value Ref Range Status   10/17/2019 05:00 AM 10.8 (H) 4.8 - 5.6 % Final    Comment:    (NOTE) Pre diabetes:          5.7%-6.4% Diabetes:              >6.4% Glycemic control for   <7.0% adults with diabetes   04/02/2019 11:55 PM 11.1 (H) 4.8 - 5.6 % Final    Comment:    (NOTE) Pre diabetes:          5.7%-6.4% Diabetes:              >6.4% Glycemic control for   <7.0% adults with diabetes     CBG: Recent Labs  Lab 11/25/19 1125 11/25/19 1545 11/25/19 2008 11/25/19 2330 11/26/19 0408  GLUCAP 132* 97 177* 188* 194*    Review of Systems:   Active smoker She does use some illicit over-the-counter' opiates'  Past Medical History  She,  has a past medical history of Asthma, Diabetes mellitus without complication ([facility]), and Rheumatoid arthritis ([facility]).   Surgical History    Past Surgical History:  Procedure Laterality Date  . LAPAROSCOPIC OVARIAN    . LEFT HEART CATH AND CORONARY ANGIOGRAPHY N/A 11/24/2019   Procedure: LEFT HEART CATH AND CORONARY ANGIOGRAPHY;  Surgeon: Kathleene Hazel, MD;  Location: [facility];  Service: Cardiovascular;  Laterality: N/A;  Social History   reports that she has been smoking. She has been smoking about 1.00 pack per day. She has never used smokeless tobacco. She reports current alcohol use. She reports that she does not use drugs.   Family History   Her family history is not on file.   Allergies No Known Allergies   Home Medications  Prior to Admission medications   Medication Sig Start Date End Date Taking? Authorizing Provider  albuterol (PROVENTIL HFA;VENTOLIN HFA) 108 (90 Base) MCG/ACT inhaler Inhale 2 puffs into the lungs every 6 (six) hours as needed for wheezing or shortness of breath.    [provider]  insulin aspart (NOVOLOG) 100 UNIT/ML injection Inject 3 Units into the skin 3 (three) times daily with meals. 10/19/19   Zannie Cove, MD  insulin glargine (LANTUS) 100 UNIT/ML injection Inject 0.25 mLs (25 Units total)  into the skin daily. 10/19/19   Zannie Cove, MD  nicotine (NICODERM CQ - DOSED IN MG/24 HOURS) 14 mg/24hr patch Place 1 patch (14 mg total) onto the skin daily. Patient not taking: Reported on 01/30/2019 04/01/18   Rolly Salter, MD  promethazine (PHENERGAN) 12.5 MG tablet Take 1 tablet (12.5 mg total) by mouth every 6 (six) hours as needed for nausea or vomiting. 10/19/19   Zannie Cove, MD     Dr. Verdene Lennert Internal Medicine PGY-1  Pager: [phone] 11/26/2019, 7:26 AM

## 2019-11-26 NOTE — Progress Notes (Signed)
Progress Note  Patient Name: Madison Williams Date of Encounter: 11/26/2019  Primary Cardiologist: new; Dr. Sanjuana Kava  Subjective   No chest pain; mild nausea  Inpatient Medications    Scheduled Meds: . amLODipine  5 mg Oral Daily  . aspirin EC  81 mg Oral Daily  . carvedilol  3.125 mg Oral BID WC  . Chlorhexidine Gluconate Cloth  6 each Topical Daily  . insulin aspart  2-6 Units Subcutaneous TID PC & HS  . insulin aspart  3 Units Subcutaneous TID WC  . insulin glargine  25 Units Subcutaneous Daily  . mouth rinse  15 mL Mouth Rinse BID  . metoCLOPramide  5 mg Oral TID AC  . nicotine  14 mg Transdermal Daily  . pantoprazole (PROTONIX) IV  40 mg Intravenous QHS  . sodium chloride flush  3 mL Intravenous Q12H   Continuous Infusions: . sodium chloride     PRN Meds: sodium chloride, acetaminophen (TYLENOL) oral liquid 160 mg/5 mL, albuterol, alum & mag hydroxide-simeth **AND** [COMPLETED] lidocaine, dextrose, docusate sodium, metoprolol tartrate, ondansetron (ZOFRAN) IV, oxyCODONE, polyethylene glycol, sodium chloride flush   Vital Signs    Vitals:   11/26/19 0600 11/26/19 0700 11/26/19 0800 11/26/19 0900  BP: (!) 130/97 (!) 133/100 108/77 119/89  Pulse: 83 94 97 100  Resp: (!) Temp:  98.5 F (36.9 C)    TempSrc:  Oral    SpO2: 99% 100% 100% 100%  Weight:      Height:        Intake/Output Summary (Last 24 hours) at 11/26/2019 1045 Last data filed at 11/26/2019 0800 Gross per 24 hour  Intake 1089.81 ml  Output 650 ml  Net 439.81 ml    I/O since admission:  +1714  Filed Weights   11/23/19 1344 11/24/19 0500 11/25/19 0500  Weight: 54.4 kg 51.1 kg 52.7 kg    Telemetry    Sinus 90-100s- Personally Reviewed  ECG    11/26/2019 ECG (independently read by me): Normal sinus rhythm at 79 bpm.  T wave inversion in leads I aVL, V4 to V6.  QTc interval 467 ms.  11/25/2019 ECG (independently read by me): NSR at 84; no significant STT changes: QTc  482 msec  11/24/2019 ECG (independently read by me): Normal sinus rhythm at 88 bpm with ST elevation inferiorly, I avL and in leads V3 through V6.  QTc interval 46 ms with T wave inversion V2-3.  Physical Exam   BP 119/89   Pulse 100   Temp 98.5 F (36.9 C) (Oral)   Resp 14   Ht  (1.702 m)   Wt 52.7 kg   LMP 03/16/2018   SpO2 100%   BMI 18.20 kg/m  General: Alert, oriented, no distress.  Skin: normal turgor, no rashes, warm and dry; several tattoos HEENT: Normocephalic, atraumatic. Pupils equal round and reactive to light; sclera anicteric; extraocular muscles intact;  Nose without nasal septal hypertrophy Mouth/Parynx benign;  Neck: No JVD, no carotid bruits; normal carotid upstroke Lungs: clear to ausculatation and percussion; no wheezing or rales Chest wall: without tenderness to palpitation Heart: PMI not displaced, RRR, s1 s2 normal, 1/6 systolic murmur, no diastolic murmur, no rubs, gallops, thrills, or heaves Abdomen: soft, nontender; no hepatosplenomehaly, BS+; abdominal aorta nontender and not dilated by palpation. Back: no CVA tenderness Pulses 2+ Musculoskeletal: full range of motion, normal strength, no joint deformities Extremities: no clubbing cyanosis or edema, Homan's sign negative  Neurologic: grossly nonfocal; Cranial  nerves grossly wnl Psychologic: Normal mood and affect   Labs    Chemistry Recent Labs  Lab 11/23/19 2118 11/24/19 0525 11/25/19 0541 11/25/19 1805 11/26/19 0749  NA 142   < > 143 143 136  K 3.5   < > 3.0* 3.3* 3.7  CL 113*   < > 114* 110 104  CO2 13*   < > 18* 22 21*  GLUCOSE 506*   < > 87 126* 340*  BUN 35*   < > CREATININE 1.94*   < > 0.89 0.76 0.82  CALCIUM 8.5*   < > 8.3* 8.6* 8.3*  PROT 6.1*  --   --   --   --   ALBUMIN 3.4*  --   --   --   --   AST 34  --   --   --   --   ALT 44  --   --   --   --   ALKPHOS 95  --   --   --   --   BILITOT 1.7*  --   --   --   --   GFRNONAA 33*   < > >60 >60 >60  GFRAA 39*    < > >60 >60 >60  ANIONGAP 16*   < > < > = values in this interval not displayed.     Hematology Recent Labs  Lab 11/25/19 1843 11/26/19 0024 11/26/19 0749  WBC 3.0* 2.5* 2.7*  RBC 3.30* 3.11* 3.24*  HGB 10.8* 10.2* 10.2*  HCT 31.8* 30.1* 31.7*  MCV 96.4 96.8 97.8  MCH 32.7 32.8 31.5  MCHC 34.0 33.9 32.2  RDW 14.6 14.4 14.5  PLT 120* 111* 92*    Cardiac EnzymesNo results for input(s): TROPONINI in the last 168 hours. No results for input(s): TROPIPOC in the last 168 hours.   BNPNo results for input(s): BNP, PROBNP in the last 168 hours.   DDimer No results for input(s): DDIMER in the last 168 hours.   Lipid Panel     Component Value Date/Time   CHOL 206 (H) 11/25/2019 0920   TRIG 79 11/25/2019 0920   HDL 98 11/25/2019 0920   CHOLHDL 2.1 11/25/2019 0920   VLDL 16 11/25/2019 0920   LDLCALC 92 11/25/2019 0920     Radiology    CT ABDOMEN PELVIS W CONTRAST  Result Date: 11/24/2019 CLINICAL DATA:  Right lower quadrant pain. Status post cardiac catheterization. DKA. EXAM: CT ABDOMEN AND PELVIS WITH CONTRAST TECHNIQUE: Multidetector CT imaging of the abdomen and pelvis was performed using the standard protocol following bolus administration of intravenous contrast. CONTRAST:  OMNIPAQUE IOHEXOL 300 MG/ML  SOLN COMPARISON:  None. FINDINGS: LOWER CHEST: Normal. HEPATOBILIARY: Normal hepatic contours. No intra- or extrahepatic biliary dilatation. The gallbladder is normal. Small amount of fluid along the inferior hepatic margin. PANCREAS: Normal pancreas. No ductal dilatation or peripancreatic fluid collection. SPLEEN: Normal. ADRENALS/URINARY TRACT: The adrenal glands are normal. No hydronephrosis, nephroureterolithiasis or solid renal mass. The urinary bladder is normal for degree of distention STOMACH/BOWEL: There is no hiatal hernia. Normal duodenal course and caliber. No small bowel dilatation or inflammation. No focal colonic abnormality. Normal appendix.  VASCULAR/LYMPHATIC: There is a large hematoma of the right pelvic side wall extending from the right femoral. The hematoma measures approximately 7.5 x 3 cm and displaces the urinary bladder and uterus to the left. This also extends along the right posterior retroperitoneum. No abdominal or  pelvic lymphadenopathy. REPRODUCTIVE: Uterus is normal. No adnexal mass. MUSCULOSKELETAL. No bony spinal canal stenosis or focal osseous abnormality. OTHER: None. IMPRESSION: 1. Large hematoma of the right pelvic side wall extending from the right femoral artery and along the right lower quadrant retroperitoneum. No active extravasation is seen, but this is a non-angiographic study. 2. Small amount of fluid along the inferior hepatic margin and extending inferiorly toward pelvis. Critical Value/emergent results were called by telephone at the time of interpretation on 11/24/2019 at 11:36 pm to provider Juanetta Snow , who verbally acknowledged these results. Electronically Signed   By: Deatra Robinson M.D.   On: 11/24/2019 23:37   ECHOCARDIOGRAM COMPLETE  Result Date: 11/25/2019    ECHOCARDIOGRAM REPORT   Patient Name:   ALEXANDRA LIPPS Wurster Date of Exam: 11/25/2019 Medical Rec #:  [id number]             Height:       67.0 in Accession #:    [id number]            Weight:       116.2 lb Date of Birth:  April 22, 1986              BSA:          1.605 m Patient Age:    33 years              BP:           116/76 mmHg Patient Gender: F                     HR:           95 bpm. Exam Location:  Inpatient Procedure: 2D Echo Indications:    CAD Native Vessel I25.10  History:        Patient has prior history of Echocardiogram examinations, most                 recent 01/31/2019. Risk Factors:Diabetes.  Sonographer:    Thurman Coyer RDCS (AE) Referring Phys: [id number] CHRISTOPHER D MCALHANY IMPRESSIONS  1. Definity used; inferolateral and apical hypokinesis with overall mild LV dysfunction; grade 1 diastolic dysfunction; moderate AI.  2. Left  ventricular ejection fraction, by estimation, is 45 to 50%. The left ventricle has mildly decreased function. The left ventricle demonstrates regional wall motion abnormalities (see scoring diagram/findings for description). Left ventricular diastolic parameters are consistent with Grade I diastolic dysfunction (impaired relaxation).  3. Right ventricular systolic function is normal. The right ventricular size is normal.  4. The mitral valve is normal in structure. Trivial mitral valve regurgitation. No evidence of mitral stenosis.  5. The aortic valve is tricuspid. Aortic valve regurgitation is moderate. No aortic stenosis is present.  6. The inferior vena cava is dilated in size with >50% respiratory variability, suggesting right atrial pressure of 8 mmHg. FINDINGS  Left Ventricle: Left ventricular ejection fraction, by estimation, is 45 to 50%. The left ventricle has mildly decreased function. The left ventricle demonstrates regional wall motion abnormalities. Definity contrast agent was given IV to delineate the left ventricular endocardial borders. The left ventricular internal cavity size was normal in size. There is no left ventricular hypertrophy. Left ventricular diastolic parameters are consistent with Grade I diastolic dysfunction (impaired relaxation). Right Ventricle: The right ventricular size is normal.Right ventricular systolic function is normal. Left Atrium: Left atrial size was normal in size. Right Atrium: Right atrial size was normal in size. Pericardium: There is no evidence of pericardial  effusion. Mitral Valve: The mitral valve is normal in structure. Normal mobility of the mitral valve leaflets. Trivial mitral valve regurgitation. No evidence of mitral valve stenosis. Tricuspid Valve: The tricuspid valve is normal in structure. Tricuspid valve regurgitation is trivial. No evidence of tricuspid stenosis. Aortic Valve: The aortic valve is tricuspid. Aortic valve regurgitation is moderate.  Aortic regurgitation PHT measures 402 msec. No aortic stenosis is present. Pulmonic Valve: The pulmonic valve was normal in structure. Pulmonic valve regurgitation is not visualized. No evidence of pulmonic stenosis. Aorta: The aortic root is normal in size and structure. Venous: The inferior vena cava is dilated in size with greater than 50% respiratory variability, suggesting right atrial pressure of 8 mmHg. IAS/Shunts: No atrial level shunt detected by color flow Doppler. Additional Comments: Definity used; inferolateral and apical hypokinesis with overall mild LV dysfunction; grade 1 diastolic dysfunction; moderate AI.  LEFT VENTRICLE PLAX 2D LVIDd:         4.30 cm  Diastology LVIDs:         3.40 cm  LV e' lateral:   8.59 cm/s LV PW:         0.80 cm  LV E/e' lateral: 5.6 LV IVS:        0.70 cm  LV e' medial:    8.16 cm/s LVOT diam:     2.00 cm  LV E/e' medial:  5.9 LV SV:         52 LV SV Index:   33 LVOT Area:     3.14 cm  RIGHT VENTRICLE RV S prime:     9.57 cm/s TAPSE (M-mode): 1.6 cm LEFT ATRIUM             Index       RIGHT ATRIUM          Index LA diam:        3.00 cm 1.87 cm/m  RA Area:     9.96 cm LA Vol (A2C):   31.3 ml 19.50 ml/m RA Volume:   21.40 ml 13.33 ml/m LA Vol (A4C):   28.2 ml 17.57 ml/m LA Biplane Vol: 31.0 ml 19.31 ml/m  AORTIC VALVE LVOT Vmax:   84.20 cm/s LVOT Vmean:  55.600 cm/s LVOT VTI:    0.167 m AI PHT:      402 msec  AORTA Ao Root diam: 3.40 cm MITRAL VALVE MV Area (PHT): 3.72 cm    SHUNTS MV Decel Time: 204 msec    Systemic VTI:  0.17 m MV E velocity: 48.00 cm/s  Systemic Diam: 2.00 cm MV A velocity: 47.60 cm/s MV E/A ratio:  1.01 Kirk Ruths MD Electronically signed by Kirk Ruths MD Signature Date/Time: 11/25/2019/12:55:45 PM    Final     Cardiac Studies    There is mild to moderate left ventricular systolic dysfunction.  LV end diastolic pressure is normal.  The left ventricular ejection fraction is 35-45% by visual estimate.  There is no mitral valve  regurgitation.   1. No angiographic evidence of CAD 2. Moderate segmental LV systolic dysfunction  It is not entirely clear what is causing her elevated troponin and chest pain. No evidence of coronary dissection. Excellent flow down all vessels with no atherosclerotic disease. This could represent a stress induced cardiomyopathy but generally we would not see a troponin level over 27,000 in the setting of stress induced cardiomyopathy. Possible coronary spasm or resolved thrombus. Also cannot exclude PE.  Admit to the ICU under PCCM service for continued management of  DKA.       ECHO 11/25/2019 IMPRESSIONS   1. Definity used; inferolateral and apical hypokinesis with overall mild  LV dysfunction; grade 1 diastolic dysfunction; moderate AI.  2. Left ventricular ejection fraction, by estimation, is 45 to 50%. The  left ventricle has mildly decreased function. The left ventricle  demonstrates regional wall motion abnormalities (see scoring  diagram/findings for description). Left ventricular  diastolic parameters are consistent with Grade I diastolic dysfunction  (impaired relaxation).  3. Right ventricular systolic function is normal. The right ventricular  size is normal.  4. The mitral valve is normal in structure. Trivial mitral valve  regurgitation. No evidence of mitral stenosis.  5. The aortic valve is tricuspid. Aortic valve regurgitation is moderate.  No aortic stenosis is present.  6. The inferior vena cava is dilated in size with >50% respiratory  variability, suggesting right atrial pressure of 8 mmHg.   Patient Profile     Madison Williams is a 34 y.o. female with a hx of RA, tobacco abuse, DM, and asthma who was seen on 11/24/2019 for the evaluation of chest pain in the setting of DKA and troponin >27,000.   Assessment & Plan    1. Day 2 s/p ACS: I have personally from the emergent catheterization done yesterday morning .  The initial image raises concern  for mid distal apical LAD vasospasm attributing to her wall motion abnormality probable earlier transient total occlusion and improvement in spasm.  Subsequent pictures show the distal LAD perfusionsignificantly improved from the initial image.  Initial ECG was consistent with a lateral STEMI and ECG 11/25/19 shows no ST segment changes with resolution of ST elevation.  I suspect her mid distal LAD wall motion abnormality most likely will be stunned myocardium rather than a reversible scar.  ECG today shows sinus rhythm with T wave inversion in leads I, aVL, V4 through V6.  QTc interval minimally increased at 467.  Echo yesterday shows EF at 45 to 50% with inferolateral and apical hypokinesis, grade 1 diastolic dysfunction, and moderate aortic insufficiency.   2.  Essential hypertension: Blood pressure today is elevated at upper 130s to 140s with diastolics in the 90s.  Amlodipine added yesterday particularly with concern for vasospasm.  Carvedilol was initiated at 3.125 mg twice a day.  She remains in sinus rhythm with periods of sinus tachycardia.  Depending upon blood pressure response, may also benefit from low-dose ARB therapy particularly with her underlying diabetes mellitus.  3.  DKA: Patient admits to diabetes for 3 to 4 years.  Upon presentation yesterday she was volume depleted BUN 35 creatinine 1.94 and glucose 506.  He was initially treated with IV insulin is now on Lantus and sliding scale.  4.  Right lower abdomen discomfort: CT scan   shows a large hematoma of the right pelvic sidewall extending from the right femoral artery along the right lower quadrant retroperitoneum.  She was seen by vascular surgery.  Hb today is stable.       5.  Evaluate for hyperlipidemia: LDL 92; HDL 98.  6.  Hypokalemia: Improved today to 3.7; resume 2.1.  7.  AKI: Initial creatinine 1.95.  Improved with hydration today at 182.  8.  Initial marked macrocytosis with MCV 113:     Repeat 97.  B12 level 587.   Folate 8.5.  9.  Tobacco abuse: Discussed the absolute importance of smoking cessation.  I discussed the vasoconstrictor effects of nicotine which may have been implicated in her  vasospasm particularly in the setting of volume depletion   Okay from cardiac standpoint to transfer out of CCU today  Signed, Lennette Bihari, MD, [facility] 11/26/2019, 10:45 AM

## 2019-11-26 NOTE — Progress Notes (Signed)
Pt received from 2H to 4E20. Oriented to room and call bell. Boyfriend at bedside. CHG wipes applied. Tele box applied and CCMD called. VSS. Call bell in reach. Will continue to monitor.  Hazle Nordmann, RN

## 2019-11-27 DIAGNOSIS — E785 Hyperlipidemia, unspecified: Secondary | ICD-10-CM

## 2019-11-27 DIAGNOSIS — R000 Tachycardia, unspecified: Secondary | ICD-10-CM

## 2019-11-27 LAB — CBC WITH DIFFERENTIAL/PLATELET
Abs Immature Granulocytes: 0.01 10*3/uL (ref 0.00–0.07)
Basophils Absolute: 0 10*3/uL (ref 0.0–0.1)
Basophils Relative: 0 %
Eosinophils Absolute: 0 10*3/uL (ref 0.0–0.5)
Eosinophils Relative: 1 %
HCT: 33.9 % — ABNORMAL LOW (ref 36.0–46.0)
Hemoglobin: 11.4 g/dL — ABNORMAL LOW (ref 12.0–15.0)
Immature Granulocytes: 0 %
Lymphocytes Relative: 40 %
Lymphs Abs: 1.3 10*3/uL (ref 0.7–4.0)
MCH: 32.7 pg (ref 26.0–34.0)
MCHC: 33.6 g/dL (ref 30.0–36.0)
MCV: 97.1 fL (ref 80.0–100.0)
Monocytes Absolute: 0.5 10*3/uL (ref 0.1–1.0)
Monocytes Relative: 17 %
Neutro Abs: 1.3 10*3/uL — ABNORMAL LOW (ref 1.7–7.7)
Neutrophils Relative %: 42 %
Platelets: 106 10*3/uL — ABNORMAL LOW (ref 150–400)
RBC: 3.49 MIL/uL — ABNORMAL LOW (ref 3.87–5.11)
RDW: 14.1 % (ref 11.5–15.5)
WBC: 3.2 10*3/uL — ABNORMAL LOW (ref 4.0–10.5)
nRBC: 0 % (ref 0.0–0.2)

## 2019-11-27 LAB — BASIC METABOLIC PANEL
Anion gap: 12 (ref 5–15)
BUN: 7 mg/dL (ref 6–20)
CO2: 21 mmol/L — ABNORMAL LOW (ref 22–32)
Calcium: 8.6 mg/dL — ABNORMAL LOW (ref 8.9–10.3)
Chloride: 104 mmol/L (ref 98–111)
Creatinine, Ser: 0.72 mg/dL (ref 0.44–1.00)
GFR calc Af Amer: >60 mL/min (ref >60)
GFR calc non Af Amer: >60 mL/min (ref >60)
Glucose, Bld: 265 mg/dL — ABNORMAL HIGH (ref 70–99)
Potassium: 4.2 mmol/L (ref 3.5–5.1)
Sodium: 137 mmol/L (ref 135–145)

## 2019-11-27 LAB — GLUCOSE, CAPILLARY
Glucose-Capillary: 285 mg/dL — ABNORMAL HIGH (ref 70–99)
Glucose-Capillary: 308 mg/dL — ABNORMAL HIGH (ref 70–99)
Glucose-Capillary: 265 mg/dL — ABNORMAL HIGH (ref 70–99)
Glucose-Capillary: 262 mg/dL — ABNORMAL HIGH (ref 70–99)
Glucose-Capillary: 327 mg/dL — ABNORMAL HIGH (ref 70–99)

## 2019-11-27 LAB — ETHANOL: Alcohol, Ethyl (B): <10 mg/dL (ref <10)

## 2019-11-27 LAB — MAGNESIUM: Magnesium: 2.1 mg/dL (ref 1.7–2.4)

## 2019-11-27 MED ORDER — INSULIN ASPART 100 UNIT/ML ~~LOC~~ SOLN
0.0000 [IU] | Freq: Three times a day (TID) | SUBCUTANEOUS | Status: DC
  Administered 2019-11-27: 17:00:00 5 [IU] via SUBCUTANEOUS
  Administered 2019-11-28: 12:00:00 3 [IU] via SUBCUTANEOUS
  Administered 2019-11-28 (×2): 2 [IU] via SUBCUTANEOUS
  Administered 2019-11-29: 3 [IU] via SUBCUTANEOUS

## 2019-11-27 MED ORDER — INSULIN ASPART 100 UNIT/ML ~~LOC~~ SOLN
4.0000 [IU] | Freq: Three times a day (TID) | SUBCUTANEOUS | Status: DC
  Administered 2019-11-27 – 2019-11-29 (×5): 4 [IU] via SUBCUTANEOUS

## 2019-11-27 MED ORDER — LORAZEPAM 2 MG/ML IJ SOLN
0.5000 mg | Freq: Once | INTRAMUSCULAR | Status: AC
  Administered 2019-11-27: 22:00:00 0.5 mg via INTRAVENOUS
  Filled 2019-11-27: qty 1

## 2019-11-27 MED ORDER — LORAZEPAM 2 MG/ML IJ SOLN
0.2500 mg | Freq: Once | INTRAMUSCULAR | Status: AC
  Administered 2019-11-27: 0.25 mg via INTRAVENOUS
  Filled 2019-11-27: qty 1

## 2019-11-27 MED ORDER — INSULIN ASPART 100 UNIT/ML ~~LOC~~ SOLN
0.0000 [IU] | Freq: Every day | SUBCUTANEOUS | Status: DC
  Administered 2019-11-27: 22:00:00 4 [IU] via SUBCUTANEOUS
  Administered 2019-11-28: 22:00:00 3 [IU] via SUBCUTANEOUS

## 2019-11-27 NOTE — Progress Notes (Signed)
Pt visitor found in room with a Truly and admitted that he has been drinking. Educated visitor that this was not allowed and he verbalized understanding. MD paged, blood alcohol level ordered. Will continue to monitor.  Hazle Nordmann, RN

## 2019-11-27 NOTE — Progress Notes (Addendum)
  Progress Note    11/27/2019 7:43 AM 3 Days Post-Op  Subjective:  More soreness in R groin today   Vitals:   11/27/19 0127 11/27/19 0439  BP:  121/89  Pulse: 95 89  Resp: 18 18  Temp: 99.5 F (37.5 C) 99.3 F (37.4 C)  SpO2:     Physical Exam: Lungs:  Non labored Incisions:  R groin cath site soft without hematoma; some fullness RLQ but abd soft Extremities:  Palpable DP pulses Abdomen:  Soft, NT, ND Neurologic: A&O  CBC    Component Value Date/Time   WBC 3.2 (L) 11/27/2019 0222   RBC 3.49 (L) 11/27/2019 0222   HGB 11.4 (L) 11/27/2019 0222   HCT 33.9 (L) 11/27/2019 0222   PLT 106 (L) 11/27/2019 0222   MCV 97.1 11/27/2019 0222   MCH 32.7 11/27/2019 0222   MCHC 33.6 11/27/2019 0222   RDW 14.1 11/27/2019 0222   LYMPHSABS 1.3 11/27/2019 0222   MONOABS 0.5 11/27/2019 0222   EOSABS 0.0 11/27/2019 0222   BASOSABS 0.0 11/27/2019 0222    BMET    Component Value Date/Time   NA 137 11/27/2019 0222   K 4.2 11/27/2019 0222   CL 104 11/27/2019 0222   CO2 21 (L) 11/27/2019 0222   GLUCOSE 265 (H) 11/27/2019 0222   BUN 7 11/27/2019 0222   CREATININE 0.72 11/27/2019 0222   CALCIUM 8.6 (L) 11/27/2019 0222   GFRNONAA >60 11/27/2019 0222   GFRAA >60 11/27/2019 0222    INR    Component Value Date/Time   INR 1.1 11/25/2019 0134     Intake/Output Summary (Last 24 hours) at 11/27/2019 0743 Last data filed at 11/26/2019 0800 Gross per 24 hour  Intake 240 ml  Output --  Net 240 ml     Assessment/Plan:  34 y.o. female is s/p RP hematoma after femoral access for cardiac cath 3 Days Post-Op   RLE well perfused R groin soft despite increase in pain Hgb continues to improve Encouraged her to increase mobility   Emilie Rutter, PA-C [facility] [phone] 11/27/2019 7:43 AM  I have seen and evaluated the patient. I agree with the PA note as documented above.  Soreness in right groin.  Hemoglobin has been stable and is 11 today.  Anticipate  nonoperative management as previously discussed.  Vascular surgery will sign off.  Give [place] a call if there is a change in her exam or other clinical concern.  Anticiptate her groin pain will slowly resolve over the next few weeks.  Cephus Shelling, MD [facility] [place] Office: [phone]

## 2019-11-27 NOTE — Progress Notes (Signed)
Progress Note  Patient Name: Madison Williams Date of Encounter: 11/27/2019  Primary Cardiologist: new; Dr. Sanjuana Kava  Subjective   Now on 4E; somewhat tearful today.  No chest pain.  No shortness of breath.  Inpatient Medications    Scheduled Meds: . amLODipine  5 mg Oral Daily  . aspirin EC  81 mg Oral Daily  . carvedilol  3.125 mg Oral BID WC  . Chlorhexidine Gluconate Cloth  6 each Topical Daily  . insulin aspart  2-6 Units Subcutaneous TID PC & HS  . insulin aspart  4 Units Subcutaneous TID WC  . insulin glargine  30 Units Subcutaneous Daily  . mouth rinse  15 mL Mouth Rinse BID  . metoCLOPramide  5 mg Oral TID AC  . nicotine  14 mg Transdermal Daily  . pantoprazole  40 mg Oral QHS  . sodium chloride flush  3 mL Intravenous Q12H   Continuous Infusions: . sodium chloride     PRN Meds: sodium chloride, acetaminophen (TYLENOL) oral liquid 160 mg/5 mL, albuterol, alum & mag hydroxide-simeth **AND** [COMPLETED] lidocaine, dextrose, docusate sodium, ondansetron (ZOFRAN) IV, oxyCODONE, polyethylene glycol, promethazine, sodium chloride flush   Vital Signs    Vitals:   11/27/19 0127 11/27/19 0439 11/27/19 0807 11/27/19 1106  BP:  121/89 116/87 111/82  Pulse: 95 89 89 (!) 105  Resp: 18 18 19 18   Temp: 99.5 F (37.5 C) 99.3 F (37.4 C) 99.3 F (37.4 C) 98.8 F (37.1 C)  TempSrc:  Oral Oral Oral  SpO2:   100% 100%  Weight:      Height:        Intake/Output Summary (Last 24 hours) at 11/27/2019 1233 Last data filed at 11/27/2019 1000 Gross per 24 hour  Intake 240 ml  Output --  Net 240 ml    I/O since admission:  +[phone]  Filed Weights   11/23/19 1344 11/24/19 0500 11/25/19 0500  Weight: 54.4 kg 51.1 kg 52.7 kg    Telemetry    Sinus tachycardia persists with heart rates 120-1 25.  She states that she is more anxious today.  Personally Reviewed  ECG    11/26/2019 ECG (independently read by me): Normal sinus rhythm at 79 bpm.  T wave inversion in  leads I aVL, V4 to V6.  QTc interval 467 ms.  11/25/2019 ECG (independently read by me): NSR at 84; no significant STT changes: QTc 482 msec  11/24/2019 ECG (independently read by me): Normal sinus rhythm at 88 bpm with ST elevation inferiorly, I avL and in leads V3 through V6.  QTc interval 46 ms with T wave inversion V2-3.  Physical Exam   BP 111/82 (BP Location: Right Arm)   Pulse (!) 105   Temp 98.8 F (37.1 C) (Oral)   Resp 18   Ht 5\' 7"  (1.702 m)   Wt 52.7 kg   LMP 03/16/2018   SpO2 100%   BMI 18.20 kg/m  General: Alert, oriented, no distress.  Skin: normal turgor, no rashes, warm and dry; tatoos HEENT: Normocephalic, atraumatic. Pupils equal round and reactive to light; sclera anicteric; extraocular muscles intact;  Nose without nasal septal hypertrophy Mouth/Parynx benign; Mallinpatti scale Neck: No JVD, no carotid bruits; normal carotid upstroke Lungs: clear to ausculatation and percussion; no wheezing or rales Chest wall: without tenderness to palpitation Heart: PMI not displaced, tachycardic at 120, s1 s2 normal, 1/6 systolic murmur, no diastolic murmur, no rubs, gallops, thrills, or heaves Abdomen: Continued tenderness right lower quadrant secondary to  documented retroperitoneal hematoma.  Soft, nontender; no hepatosplenomehaly, BS+; abdominal aorta nontender and not dilated by palpation. Back: no CVA tenderness Pulses 2+ Musculoskeletal: full range of motion, normal strength, no joint deformities Extremities: no clubbing cyanosis or edema, Homan's sign negative  Neurologic: grossly nonfocal; Cranial nerves grossly wnl Psychologic: Normal mood and affect   Labs    Chemistry Recent Labs  Lab 11/23/19 2118 11/24/19 0525 11/25/19 1805 11/26/19 0749 11/27/19 0222  NA 142   < > 143 136 137  K 3.5   < > 3.3* 3.7 4.2  CL 113*   < > 110 104 104  CO2 13*   < > 22 21* 21*  GLUCOSE 506*   < > 126* 340* 265*  BUN 35*   < > 10 7 7   CREATININE 1.94*   < > 0.76 0.82  0.72  CALCIUM 8.5*   < > 8.6* 8.3* 8.6*  PROT 6.1*  --   --   --   --   ALBUMIN 3.4*  --   --   --   --   AST 34  --   --   --   --   ALT 44  --   --   --   --   ALKPHOS 95  --   --   --   --   BILITOT 1.7*  --   --   --   --   GFRNONAA 33*   < > >60 >60 >60  GFRAA 39*   < > >60 >60 >60  ANIONGAP 16*   < > 11 11 12    < > = values in this interval not displayed.     Hematology Recent Labs  Lab 11/26/19 0749 11/26/19 1228 11/27/19 0222  WBC 2.7* 2.7* 3.2*  RBC 3.24* 3.47* 3.49*  HGB 10.2* 11.4* 11.4*  HCT 31.7* 34.0* 33.9*  MCV 97.8 98.0 97.1  MCH 31.5 32.9 32.7  MCHC 32.2 33.5 33.6  RDW 14.5 14.4 14.1  PLT 92* 105* 106*    Cardiac EnzymesNo results for input(s): TROPONINI in the last 168 hours. No results for input(s): TROPIPOC in the last 168 hours.   BNPNo results for input(s): BNP, PROBNP in the last 168 hours.   DDimer No results for input(s): DDIMER in the last 168 hours.   Lipid Panel     Component Value Date/Time   CHOL 206 (H) 11/25/2019 0920   TRIG 79 11/25/2019 0920   HDL 98 11/25/2019 0920   CHOLHDL 2.1 11/25/2019 0920   VLDL 16 11/25/2019 0920   LDLCALC 92 11/25/2019 0920     Radiology    No results found.  Cardiac Studies    There is mild to moderate left ventricular systolic dysfunction.  LV end diastolic pressure is normal.  The left ventricular ejection fraction is 35-45% by visual estimate.  There is no mitral valve regurgitation.   1. No angiographic evidence of CAD 2. Moderate segmental LV systolic dysfunction  It is not entirely clear what is causing her elevated troponin and chest pain. No evidence of coronary dissection. Excellent flow down all vessels with no atherosclerotic disease. This could represent a stress induced cardiomyopathy but generally we would not see a troponin level over 27,000 in the setting of stress induced cardiomyopathy. Possible coronary spasm or resolved thrombus. Also cannot exclude PE.  Admit to the  ICU under PCCM service for continued management of DKA.       ECHO 11/25/2019 IMPRESSIONS  1. Definity used; inferolateral and apical hypokinesis with overall mild  LV dysfunction; grade 1 diastolic dysfunction; moderate AI.  2. Left ventricular ejection fraction, by estimation, is 45 to 50%. The  left ventricle has mildly decreased function. The left ventricle  demonstrates regional wall motion abnormalities (see scoring  diagram/findings for description). Left ventricular  diastolic parameters are consistent with Grade I diastolic dysfunction  (impaired relaxation).  3. Right ventricular systolic function is normal. The right ventricular  size is normal.  4. The mitral valve is normal in structure. Trivial mitral valve  regurgitation. No evidence of mitral stenosis.  5. The aortic valve is tricuspid. Aortic valve regurgitation is moderate.  No aortic stenosis is present.  6. The inferior vena cava is dilated in size with >50% respiratory  variability, suggesting right atrial pressure of 8 mmHg.   Patient Profile     Madison Williams is a 34 y.o. female with a hx of RA, tobacco abuse, DM, and asthma who was seen on 11/24/2019 for the evaluation of chest pain in the setting of DKA and troponin >27,000.   Assessment & Plan    1. Day 3 s/p ACS: I have personally from the emergent catheterization. The initial image raises concern for mid distal apical LAD vasospasm attributing to her wall motion abnormality probable earlier transient total occlusion and improvement in spasm.  Subsequent pictures show the distal LAD perfusionsignificantly improved from the initial image.  Initial ECG was consistent with a lateral STEMI and ECG 11/25/19 shows no ST segment changes with resolution of ST elevation.  I suspect her mid distal LAD wall motion abnormality most likely will be stunned myocardium rather than a reversible scar.  F/U ECG shows sinus rhythm with T wave inversion in leads I,  aVL, V4 through V6.  QTc interval minimally increased at 467.  Echo shows EF at 45 to 50% with inferolateral and apical hypokinesis, grade 1 diastolic dysfunction, and moderate aortic insufficiency.  Will need subsequent outpatient echo which hopefully will show improvement in LV function.  2.  Essential hypertension: Blood pressure today is elevated at upper 130s to 140s with diastolics in the 90s.  Amlodipine added  particularly with concern for vasospasm.  Carvedilol was initiated at 3.125 mg twice a day.  Monitor today shows sinus tachycardia with rates 1 20-1 30.  Will increase carvedilol to 6.25 mg twice a day.  In future, depending upon blood pressure response, consider ARB therapy particularly with lying diabetes mellitus.   3.  DKA: Patient admits to diabetes for 3 to 4 years.  At presentation  she was volume depleted BUN 35 creatinine 1.94 and glucose 506;  initially treated with IV insulin is now on Lantus and sliding scale.  4.  Right lower abdomen discomfort: CT scan  shows a large hematoma of the right pelvic sidewall extending from the right femoral artery along the right lower quadrant retroperitoneum.  She was seen by vascular surgery.  Stable hemoglobin at 11.4/hematocrit 33.9.       5.  Thrombocytopenia: Platelet count 106 K today.  Recommend close follow-up.  5.  Hyperlipidemia: LDL 92; HDL 98. With  Diabetes mellitus, target LDL less than 70.  6.  Hypokalemia: Improved, 4.2 today.  7.  AKI: Initial creatinine 1.95.  Improved with hydration; today 0.72  8.  Initial marked macrocytosis with MCV 113:     Repeat 97.  B12 level 587.  Folate 8.5.  9.  Tobacco abuse: Discussed the absolute importance of smoking  cessation.  I previously discussed the vasoconstrictor effects of nicotine which may have been implicated in her vasospasm particularly in the setting of volume depletion     Signed, Troy Sine, MD, [facility] 11/27/2019, 12:33 PM

## 2019-11-27 NOTE — Progress Notes (Signed)
PROGRESS NOTE    Madison Williams  FYB:[id number] DOB: [birth year] DOA: 11/23/2019 PCP: Patient, No Pcp Per   Brief Narrative:  HPI On 11/23/2019 by Dr. Virl Diamond  Feeling poorly for couple of days Not able to keep anything down Some abdominal discomfort Denies dysuria Denies contact with anybody with a febrile illness Has been admitted recently for DKA  Interim history Patient was admitted with DKA which is now resolved.  Patient noted to have ST EMI due to coronary vasospasm and cardiomyopathy, status post catheterization.  Cardiology following.  Was noted to have a hematoma post left heart cath, vascular surgery was consulted and has now signed off.  Assessment & Plan   DKA/diabetes mellitus type 1 with hyperglycemia -Patient did require ICU admission -She was on insulin drip however transition to sliding scale as well as Lantus and mealtime coverage -Hemoglobin A1c 10.8 on 10/17/2019  STEMI/cardiomyopathy -Cardiology consulted and appreciated -Echocardiogram showed an EF of 45 to 50%, grade 1 diastolic dysfunction -Status post heart catheterization: Mild to moderate LV systolic dysfunction.  EF 35 to 40% by visual estimate.  No angiographic evidence of CAD. -Likely secondary to coronary vasospasm -Patient will need echocardiogram and outpatient -Continue Coreg, aspirin -Would likely benefit from ACE inhibitor or ARB given underlying diabetes  Acute blood loss anemia secondary to retroperitoneal hematoma -Occurred after left heart catheterization -Hemoglobin is currently stable, 11.4 -Vascular surgery consulted and appreciated, and has now signed off  -Continue monitor CBC  Acute kidney injury -Likely secondary to DKA and dehydration -Creatinine was up to 1.95 -Was placed on IV fluids, creatinine down to 0.72 -Continue to monitor BMP  Essential hypertension -BP currently stable -Continue amlodipine, Coreg -As above, would likely benefit from an ACE  inhibitor or ARB given underlying diabetes  Thrombocytopenia -Platelets do appear to be improving, continue to monitor CBC  Sinus tachycardia -Secondary to episode with boyfriend this afternoon -Heart rate is improving -Likely secondary to anxiety, given low-dose Ativan  History of vaping/previous cigarette smoking -Smoking cessation discussed -Continue nicotine patch  DVT Prophylaxis  SCDs  Code Status: Full  Family Communication:  None at bedside  Disposition Plan:  Status is: Inpatient  Remains inpatient appropriate because:Hemodynamically unstable.  Heart rate up into the 130s to 140s.  Cardiology still titrating medications.   Dispo: The patient is from: Home              Anticipated d/c is to: Home              Anticipated d/c date is: 2 days              Patient currently is not medically stable to d/c.  Consultants Cardiology Vascular surgery PCCM  Procedures  Echocardiogram Left heart catheterization  Antibiotics   Anti-infectives (From admission, onward)   Start     Dose/Rate Route Frequency Ordered Stop   11/23/19 1800  cefTRIAXone (ROCEPHIN) 2 g in sodium chloride 0.9 % 100 mL IVPB  Status:  Discontinued     2 g 200 mL/hr over 30 Minutes Intravenous Every 24 hours 11/23/19 1725 11/25/19 1725      Subjective:   Madison Williams seen and examined today.  Patient feels sore and tired today.  No complaints of chest pain or shortness of breath.  Denies abdominal pain, nausea or vomiting, dizziness or headache.  Objective:   Vitals:   11/27/19 0127 11/27/19 0439 11/27/19 0807 11/27/19 1106  BP:  121/89 116/87 111/82  Pulse: 95 89 89 (!)  105  Resp: 18 18 19 18   Temp: 99.5 F (37.5 C) 99.3 F (37.4 C) 99.3 F (37.4 C) 98.8 F (37.1 C)  TempSrc:  Oral Oral Oral  SpO2:   100% 100%  Weight:      Height:        Intake/Output Summary (Last 24 hours) at 11/27/2019 1408 Last data filed at 11/27/2019 1000 Gross per 24 hour  Intake 240 ml  Output --   Net 240 ml   Filed Weights   11/23/19 1344 11/24/19 0500 11/25/19 0500  Weight: 54.4 kg 51.1 kg 52.7 kg    Exam  General: Well developed, well nourished, NAD, appears stated age  57: NCAT, mucous membranes moist.   Cardiovascular: S1 S2 auscultated, soft SEM, RRR   Respiratory: Clear to auscultation bilaterally with equal chest rise  Abdomen: Soft, nontender, nondistended, + bowel sounds  Extremities: warm dry without cyanosis clubbing or edema  Neuro: AAOx3, nonfocal  Skin: Without rashes exudates or nodules, multiple tattoos  Psych: Normal affect and demeanor with intact judgement and insight   Data Reviewed: I have personally reviewed following labs and imaging studies  CBC: Recent Labs  Lab 11/24/19 0525 11/25/19 0134 11/25/19 1843 11/26/19 0024 11/26/19 0749 11/26/19 1228 11/27/19 0222  WBC 8.8   < > 3.0* 2.5* 2.7* 2.7* 3.2*  NEUTROABS 6.5  --   --   --   --   --  1.3*  HGB 13.0   < > 10.8* 10.2* 10.2* 11.4* 11.4*  HCT 38.2   < > 31.8* 30.1* 31.7* 34.0* 33.9*  MCV 97.2   < > 96.4 96.8 97.8 98.0 97.1  PLT 147*   < > 120* 111* 92* 105* 106*   < > = values in this interval not displayed.   Basic Metabolic Panel: Recent Labs  Lab 11/23/19 2118 11/24/19 0525 11/25/19 0134 11/25/19 0541 11/25/19 1416 11/25/19 1805 11/26/19 0749 11/27/19 0222  NA 142   < > 139 143  --  143 136 137  K 3.5   < > 3.2* 3.0*  --  3.3* 3.7 4.2  CL 113*   < > 111 114*  --  110 104 104  CO2 13*   < > 17* 18*  --  22 21* 21*  GLUCOSE 506*   < > 79 87  --  126* 340* 265*  BUN 35*   < > 19 16  --  10 7 7   CREATININE 1.94*   < > 0.86 0.89  --  0.76 0.82 0.72  CALCIUM 8.5*   < > 8.1* 8.3*  --  8.6* 8.3* 8.6*  MG 2.3  --   --   --  1.7  --  2.1 2.1  PHOS 2.1*  --   --   --   --   --   --   --    < > = values in this interval not displayed.   GFR: Estimated Creatinine Clearance: 84 mL/min (by C-G formula based on SCr of 0.72 mg/dL). Liver Function Tests: Recent Labs  Lab  11/23/19 2118  AST 34  ALT 44  ALKPHOS 95  BILITOT 1.7*  PROT 6.1*  ALBUMIN 3.4*   Recent Labs  Lab 11/23/19 2118  LIPASE 26  AMYLASE 90   No results for input(s): AMMONIA in the last 168 hours. Coagulation Profile: Recent Labs  Lab 11/25/19 0134  INR 1.1   Cardiac Enzymes: No results for input(s): CKTOTAL, CKMB, CKMBINDEX, TROPONINI in the  last 168 hours. BNP (last 3 results) No results for input(s): PROBNP in the last 8760 hours. HbA1C: No results for input(s): HGBA1C in the last 72 hours. CBG: Recent Labs  Lab 11/26/19 1637 11/26/19 2111 11/27/19 0616 11/27/19 0743 11/27/19 1111  GLUCAP 217* 182* 285* 308* 265*   Lipid Profile: Recent Labs    11/25/19 0920  CHOL 206*  HDL 98  LDLCALC 92  TRIG 79  CHOLHDL 2.1   Thyroid Function Tests: No results for input(s): TSH, T4TOTAL, FREET4, T3FREE, THYROIDAB in the last 72 hours. Anemia Panel: Recent Labs    11/25/19 0920  VITAMINB12 587  FOLATE 8.5  FERRITIN 155  TIBC 228*  IRON 129  RETICCTPCT 0.8   Urine analysis:    Component Value Date/Time   COLORURINE STRAW (A) 11/23/2019 1152   APPEARANCEUR HAZY (A) 11/23/2019 1152   LABSPEC 1.014 11/23/2019 1152   PHURINE 5.0 11/23/2019 1152   GLUCOSEU >=500 (A) 11/23/2019 1152   HGBUR MODERATE (A) 11/23/2019 1152   BILIRUBINUR NEGATIVE 11/23/2019 1152   KETONESUR 80 (A) 11/23/2019 1152   PROTEINUR 100 (A) 11/23/2019 1152   NITRITE NEGATIVE 11/23/2019 1152   LEUKOCYTESUR NEGATIVE 11/23/2019 1152   Sepsis Labs: @LABRCNTIP (procalcitonin:4,lacticidven:4)  ) Recent Results (from the past 240 hour(s))  Respiratory Panel by RT PCR (Flu A&B, Covid) - Nasopharyngeal Swab     Status: None   Collection Time: 11/23/19 12:18 PM   Specimen: Nasopharyngeal Swab  Result Value Ref Range Status   SARS Coronavirus 2 by RT PCR NEGATIVE NEGATIVE Final    Comment: (NOTE) SARS-CoV-2 target nucleic acids are NOT DETECTED. The SARS-CoV-2 RNA is generally detectable in  upper respiratoy specimens during the acute phase of infection. The lowest concentration of SARS-CoV-2 viral copies this assay can detect is 131 copies/mL. A negative result does not preclude SARS-Cov-2 infection and should not be used as the sole basis for treatment or other patient management decisions. A negative result may occur with  improper specimen collection/handling, submission of specimen other than nasopharyngeal swab, presence of viral mutation(s) within the areas targeted by this assay, and inadequate number of viral copies (<131 copies/mL). A negative result must be combined with clinical observations, patient history, and epidemiological information. The expected result is Negative. Fact Sheet for Patients:  11/25/19 Fact Sheet for Healthcare Providers:  https://www.moore.com/ This test is not yet ap proved or cleared by the https://www.young.biz/ FDA and  has been authorized for detection and/or diagnosis of SARS-CoV-2 by FDA under an Emergency Use Authorization (EUA). This EUA will remain  in effect (meaning this test can be used) for the duration of the COVID-19 declaration under Section 564(b)(1) of the Act, 21 U.S.C. section 360bbb-3(b)(1), unless the authorization is terminated or revoked sooner.    Influenza A by PCR NEGATIVE NEGATIVE Final   Influenza B by PCR NEGATIVE NEGATIVE Final    Comment: (NOTE) The Xpert Xpress SARS-CoV-2/FLU/RSV assay is intended as an aid in  the diagnosis of influenza from Nasopharyngeal swab specimens and  should not be used as a sole basis for treatment. Nasal washings and  aspirates are unacceptable for Xpert Xpress SARS-CoV-2/FLU/RSV  testing. Fact Sheet for Patients: [place] Fact Sheet for Healthcare Providers: https://www.moore.com/ This test is not yet approved or cleared by the https://www.young.biz/ FDA and  has been authorized for  detection and/or diagnosis of SARS-CoV-2 by  FDA under an Emergency Use Authorization (EUA). This EUA will remain  in effect (meaning this test can be used) for the  duration of the  Covid-19 declaration under Section 564(b)(1) of the Act, 21  U.S.C. section 360bbb-3(b)(1), unless the authorization is  terminated or revoked. Performed at [facility], [address], [place], [place] [address]   Culture, blood (routine x 2)     Status: None (Preliminary result)   Collection Time: 11/23/19  5:56 PM   Specimen: BLOOD LEFT HAND  Result Value Ref Range Status   Specimen Description   Final    BLOOD LEFT HAND Performed at [facility], [address] [place], [place] [address]    Special Requests   Final    BOTTLES DRAWN AEROBIC ONLY Blood Culture adequate volume Performed at [facility], [address] [place], [place] [address]    Culture   Final    NO GROWTH 4 DAYS Performed at [facility], [address] [place], [place] [address]    Report Status PENDING  Incomplete  Culture, blood (routine x 2)     Status: None (Preliminary result)   Collection Time: 11/23/19  5:56 PM   Specimen: BLOOD LEFT HAND  Result Value Ref Range Status   Specimen Description   Final    BLOOD LEFT HAND Performed at [facility], [address], [place], [place] [address]    Special Requests   Final    BOTTLES DRAWN AEROBIC ONLY Blood Culture results may not be optimal due to an inadequate volume of blood received in culture bottles Performed at [facility], [address], [place], [place] [address]    Culture   Final    NO GROWTH 4 DAYS Performed at [facility], [address] [place], [place] [address]    Report Status PENDING  Incomplete  MRSA PCR Screening     Status: None   Collection Time: 11/23/19  7:50 PM   Specimen: Nasal Mucosa; Nasopharyngeal  Result Value Ref Range Status     MRSA by PCR NEGATIVE NEGATIVE Final    Comment:        The GeneXpert MRSA Assay (FDA approved for NASAL specimens only), is one component of a comprehensive MRSA colonization surveillance program. It is not intended to diagnose MRSA infection nor to guide or monitor treatment for MRSA infections. Performed at [facility], [address] [place], [place] [address]       Radiology Studies: No results found.   Scheduled Meds: . amLODipine  5 mg Oral Daily  . aspirin EC  81 mg Oral Daily  . carvedilol  3.125 mg Oral BID WC  . Chlorhexidine Gluconate Cloth  6 each Topical Daily  . insulin aspart  2-6 Units Subcutaneous TID PC & HS  . insulin aspart  4 Units Subcutaneous TID WC  . insulin glargine  30 Units Subcutaneous Daily  . mouth rinse  15 mL Mouth Rinse BID  . metoCLOPramide  5 mg Oral TID AC  . nicotine  14 mg Transdermal Daily  . pantoprazole  40 mg Oral QHS  . sodium chloride flush  3 mL Intravenous Q12H   Continuous Infusions: . sodium chloride       LOS: 4 days   Time Spent in minutes   45 minutes  Ajai Terhaar D.O. on 11/27/2019 at 2:08 PM  Between 7am to 7pm - Please see pager noted on [email or web address]  After 7pm go to [email or web address]  And look for the night coverage person covering for me after hours  [place]  [phone]

## 2019-11-27 NOTE — Progress Notes (Signed)
Rechecked patient's temperature as previous temp was 100.5.  Oral temperature now 99.5 after having been given PRN tylenol.

## 2019-11-27 NOTE — Progress Notes (Signed)
Inpatient Diabetes Program Recommendations  AACE/ADA: New Consensus Statement on Inpatient Glycemic Control (2015)  Target Ranges:  Prepandial:   less than 140 mg/dL      Peak postprandial:   less than 180 mg/dL (1-2 hours)      Critically ill patients:  140 - 180 mg/dL   Lab Results  Component Value Date   GLUCAP 265 (H) 11/27/2019   HGBA1C 10.8 (H) 10/17/2019    Review of Glycemic Control Results for [name], [name] (MRN [id number]) as of 11/27/2019 13:54  Ref. Range 11/26/2019 21:11 11/27/2019 06:16 11/27/2019 07:43 11/27/2019 11:11  Glucose-Capillary Latest Ref Range: 70 - 99 mg/dL 520 (H) 761 (H) 915 (H) 265 (H)   Review of Glycemic Control Diabetes history:DM type 1 Outpatient Diabetes medications:Lantus 25 units Daily, Novolog 3 units tid Current orders for Inpatient glycemic control: Lantus 30 units daily, Novolog 2-4-6 units tid with meals and HS, Novolog 4 units tid with meals  Inpatient Diabetes Program Recommendations:    Consider changing correction to Novolog 0-6 units TID & HS and increasing Lantus to 34 units QD.   Thanks, Lujean Rave, MSN, RNC-OB Diabetes Coordinator [phone] (8a-5p)

## 2019-11-27 NOTE — Progress Notes (Signed)
Per pt request, Orpah Clinton Daughtery (significant other) not allowed to come back to the hospital and visit while patient is admitted.   Hazle Nordmann, RN

## 2019-11-28 ENCOUNTER — Telehealth: Payer: Self-pay

## 2019-11-28 DIAGNOSIS — F419 Anxiety disorder, unspecified: Secondary | ICD-10-CM

## 2019-11-28 DIAGNOSIS — T148XXA Other injury of unspecified body region, initial encounter: Secondary | ICD-10-CM

## 2019-11-28 LAB — GLUCOSE, CAPILLARY
Glucose-Capillary: 199 mg/dL — ABNORMAL HIGH (ref 70–99)
Glucose-Capillary: 218 mg/dL — ABNORMAL HIGH (ref 70–99)
Glucose-Capillary: 170 mg/dL — ABNORMAL HIGH (ref 70–99)
Glucose-Capillary: 257 mg/dL — ABNORMAL HIGH (ref 70–99)

## 2019-11-28 LAB — CBC
HCT: 30.4 % — ABNORMAL LOW (ref 36.0–46.0)
Hemoglobin: 10.3 g/dL — ABNORMAL LOW (ref 12.0–15.0)
MCH: 32.9 pg (ref 26.0–34.0)
MCHC: 33.9 g/dL (ref 30.0–36.0)
MCV: 97.1 fL (ref 80.0–100.0)
Platelets: 132 10*3/uL — ABNORMAL LOW (ref 150–400)
RBC: 3.13 MIL/uL — ABNORMAL LOW (ref 3.87–5.11)
RDW: 13.8 % (ref 11.5–15.5)
WBC: 3.6 10*3/uL — ABNORMAL LOW (ref 4.0–10.5)
nRBC: 0 % (ref 0.0–0.2)

## 2019-11-28 LAB — BASIC METABOLIC PANEL
Anion gap: 8 (ref 5–15)
BUN: 11 mg/dL (ref 6–20)
CO2: 25 mmol/L (ref 22–32)
Calcium: 8.7 mg/dL — ABNORMAL LOW (ref 8.9–10.3)
Chloride: 102 mmol/L (ref 98–111)
Creatinine, Ser: 0.6 mg/dL (ref 0.44–1.00)
GFR calc Af Amer: >60 mL/min (ref >60)
GFR calc non Af Amer: >60 mL/min (ref >60)
Glucose, Bld: 222 mg/dL — ABNORMAL HIGH (ref 70–99)
Potassium: 3.8 mmol/L (ref 3.5–5.1)
Sodium: 135 mmol/L (ref 135–145)

## 2019-11-28 LAB — CULTURE, BLOOD (ROUTINE X 2)
Culture: NO GROWTH
Culture: NO GROWTH
Special Requests: ADEQUATE

## 2019-11-28 MED ORDER — INSULIN GLARGINE 100 UNIT/ML ~~LOC~~ SOLN
34.0000 [IU] | Freq: Every day | SUBCUTANEOUS | Status: DC
  Filled 2019-11-28: qty 0.34

## 2019-11-28 MED ORDER — AMLODIPINE BESYLATE 5 MG PO TABS
2.5000 mg | ORAL_TABLET | Freq: Every day | ORAL | Status: DC
  Filled 2019-11-28: qty 1

## 2019-11-28 MED ORDER — INSULIN GLARGINE 100 UNIT/ML ~~LOC~~ SOLN
4.0000 [IU] | Freq: Once | SUBCUTANEOUS | Status: AC
  Administered 2019-11-28: 11:00:00 4 [IU] via SUBCUTANEOUS
  Filled 2019-11-28: qty 0.04

## 2019-11-28 MED ORDER — LORAZEPAM 2 MG/ML IJ SOLN
0.5000 mg | Freq: Once | INTRAMUSCULAR | Status: AC
  Administered 2019-11-28: 14:00:00 0.5 mg via INTRAVENOUS
  Filled 2019-11-28: qty 1

## 2019-11-28 MED ORDER — PROMETHAZINE HCL 25 MG/ML IJ SOLN
12.5000 mg | Freq: Four times a day (QID) | INTRAMUSCULAR | Status: DC | PRN
  Administered 2019-11-28: 17:00:00 12.5 mg via INTRAVENOUS
  Filled 2019-11-28: qty 1

## 2019-11-28 MED ORDER — ZOLPIDEM TARTRATE 5 MG PO TABS
5.0000 mg | ORAL_TABLET | Freq: Once | ORAL | Status: AC
  Administered 2019-11-28: 5 mg via ORAL
  Filled 2019-11-28: qty 1

## 2019-11-28 NOTE — Progress Notes (Signed)
Spoke to Dr Catha Gosselin regarding TOC medications if D/c tomorrow. Match approved.  Awaiting cardiology to finalize medications. Plan for discharge in AM Can give match letter for patient to pick up medications at pharmacy if too late for [facility] pharmacy

## 2019-11-28 NOTE — TOC Initial Note (Signed)
Transition of Care [facility]) - Initial/Assessment Note    Patient Details  Name: Madison Williams MRN: [id number] Date of Birth: [birth year]  Transition of [facility]) CM/SW Contact:    Lockie Pares, RN Phone Number: 11/28/2019, 1:47 PM  Clinical Narrative:                 Patient admitted with DKA now on 4E still with tachycardia. Upset when care manager visited as her mother had decided to take her daughter on vacation tomorrow. She is tearful that she has not seen daughter in a week. Patient lives with minor daughter and elderly grandmother. Feels stressed and is a "ball of anxiety" but "keeps it together" most days. Gave contacts for counseling in [place] area.. Informed patient that she has follow up appointment with [facility]. She agrees to time and date. She states she has no needs for equipment at home or [facility]. Does need help with medications. Will look into MATCH.    Expected Discharge Plan: Home/Self Care Barriers to Discharge: Continued Medical Work up   Patient Goals and CMS Choice        Expected Discharge Plan and Services Expected Discharge Plan: Home/Self Care In-house Referral: PCP / [facility] Discharge Planning Services: [facility]   Living arrangements for the past 2 months: Apartment                                      Prior Living Arrangements/Services Living arrangements for the past 2 months: Apartment Lives with:: Minor Children, Relatives Patient language and need for interpreter reviewed:: Yes Do you feel safe going back to the place where you live?: Yes      Need for Family Participation in Patient Care: Yes (Comment) Care giver support system in place?: Yes (comment)   Criminal Activity/Legal Involvement Pertinent to Current Situation/Hospitalization: No - Comment as needed  Activities of Daily Living Home Assistive Devices/Equipment: Eyeglasses ADL Screening (condition at time of  admission) Patient's cognitive ability adequate to safely complete daily activities?: Yes Is the patient deaf or have difficulty hearing?: No Does the patient have difficulty seeing, even when wearing glasses/contacts?: No Does the patient have difficulty concentrating, remembering, or making decisions?: No Patient able to express need for assistance with ADLs?: Yes Does the patient have difficulty dressing or bathing?: No Independently performs ADLs?: Yes (appropriate for developmental age) Does the patient have difficulty walking or climbing stairs?: No Weakness of Legs: Both Weakness of Arms/Hands: Both  Permission Sought/Granted                  Emotional Assessment Appearance:: Appears stated age Attitude/Demeanor/Rapport: Crying Affect (typically observed): Anxious Orientation: : Oriented to Self, Oriented to Place, Oriented to  Time, Oriented to Situation Alcohol / Substance Use: Tobacco Use Psych Involvement: No (comment)  Admission diagnosis:  Hyperkalemia [E87.5] Confusion [R41.0] DKA (diabetic ketoacidoses) (HCC) [E11.10] Diabetic ketoacidosis without coma associated with type 1 diabetes mellitus (HCC) [E10.10] DKA (diabetic ketoacidosis) (HCC) [E11.10] Patient Active Problem List   Diagnosis Date Noted  . SOB (shortness of breath)   . Hypokalemia   . Macrocytosis without anemia   . Retroperitoneal hematoma   . DKA (diabetic ketoacidosis) (HCC) 11/24/2019  . Acute ST elevation myocardial infarction (STEMI) of anterolateral wall (HCC)   . Abnormal EKG   . Elevated troponin   . Nausea & vomiting 10/16/2019  .  Abdominal pain 10/16/2019  . Chest tightness   . Mild intermittent asthma without complication   . Acute vomiting 01/31/2019  . Type 1 diabetes mellitus with hyperglycemia ([place]) 01/31/2019  . Uninsured 01/31/2019  . DKA (diabetic ketoacidoses) ([place]) 03/30/2018  . AKI (acute kidney injury) ([place]) 03/30/2018  . Leukocytosis 03/30/2018  . Dehydration  03/30/2018  . Tobacco use disorder 03/30/2018   PCP:  Patient, No Pcp Per Pharmacy:   [facility] [place] [place] [address] [place] [address] Phone: [phone] Fax: [phone]     Social Determinants of Health ([place]) Interventions    Readmission Risk Interventions No flowsheet data found.

## 2019-11-28 NOTE — Progress Notes (Signed)
Progress Note  Patient Name: Madison Williams Date of Encounter: 11/28/2019  Primary Cardiologist: new; Dr. Julianne Handler  Subjective   No chest pain; mild nausea  Inpatient Medications    Scheduled Meds: . amLODipine  5 mg Oral Daily  . aspirin EC  81 mg Oral Daily  . carvedilol  3.125 mg Oral BID WC  . Chlorhexidine Gluconate Cloth  6 each Topical Daily  . insulin aspart  0-5 Units Subcutaneous QHS  . insulin aspart  0-9 Units Subcutaneous TID WC  . insulin aspart  4 Units Subcutaneous TID WC  . [START ON 11/29/2019] insulin glargine  34 Units Subcutaneous Daily  . mouth rinse  15 mL Mouth Rinse BID  . metoCLOPramide  5 mg Oral TID AC  . nicotine  14 mg Transdermal Daily  . pantoprazole  40 mg Oral QHS  . sodium chloride flush  3 mL Intravenous Q12H   Continuous Infusions: . sodium chloride     PRN Meds: sodium chloride, acetaminophen (TYLENOL) oral liquid 160 mg/5 mL, albuterol, alum & mag hydroxide-simeth **AND** [COMPLETED] lidocaine, dextrose, docusate sodium, ondansetron (ZOFRAN) IV, oxyCODONE, polyethylene glycol, promethazine, sodium chloride flush   Vital Signs    Vitals:   11/28/19 0805 11/28/19 1212 11/28/19 1649 11/28/19 1650  BP: 104/72 94/69 (!) 89/62 91/63  Pulse: 89 100 98   Resp: 20 18 (!) 23 14  Temp: 98.7 F (37.1 C) 99.5 F (37.5 C) 98.6 F (37 C)   TempSrc: Oral Oral Oral   SpO2: 100% 99% 100%   Weight:      Height:        Intake/Output Summary (Last 24 hours) at 11/28/2019 1723 Last data filed at 11/28/2019 1500 Gross per 24 hour  Intake 480 ml  Output --  Net 480 ml    I/O since admission:  +2874  Filed Weights   11/24/19 0500 11/25/19 0500 11/28/19 0649  Weight: 51.1 kg 52.7 kg 50.8 kg    Telemetry    Sinus tachycardia persists with heart rates 120-1 25.  She states that she is more anxious today.  Personally Reviewed  ECG    11/26/2019 ECG (independently read by me): Normal sinus rhythm at 79 bpm.  T wave inversion in  leads I aVL, V4 to V6.  QTc interval 467 ms.  11/25/2019 ECG (independently read by me): NSR at 84; no significant STT changes: QTc 482 msec  11/24/2019 ECG (independently read by me): Normal sinus rhythm at 88 bpm with ST elevation inferiorly, I avL and in leads V3 through V6.  QTc interval 46 ms with T wave inversion V2-3.  Physical Exam   BP 93/65 (BP Location: Right Arm)   Pulse 98   Temp 98.6 F (37 C) (Oral)   Resp 18   Ht 5\' 7"  (1.702 m)   Wt 50.8 kg   LMP 03/16/2018   SpO2 100%   BMI 17.54 kg/m  General: Alert, oriented, no distress.  Skin: normal turgor, no rashes, warm and dry HEENT: Normocephalic, atraumatic. Pupils equal round and reactive to light; sclera anicteric; extraocular muscles intact;  Nose without nasal septal hypertrophy Mouth/Parynx benign; z Neck: No JVD, no carotid bruits; normal carotid upstroke Lungs: clear to ausculatation and percussion; no wheezing or rales Chest wall: without tenderness to palpitation Heart: PMI not displaced, RRR, s1 s2 normal, 1/6 systolic murmur, no diastolic murmur, no rubs, gallops, thrills, or heaves Abdomen: soft, nontender; no hepatosplenomehaly, BS+; abdominal aorta nontender and not dilated by palpation. Back:  no CVA tenderness Pulses 2+ Musculoskeletal: full range of motion, normal strength, no joint deformities Extremities: no clubbing cyanosis or edema, Homan's sign negative  Neurologic: grossly nonfocal; Cranial nerves grossly wnl Psychologic: Normal mood and affect    Labs    Chemistry Recent Labs  Lab 11/23/19 2118 11/24/19 0525 11/26/19 0749 11/27/19 0222 11/28/19 0251  NA 142   < > 136 137 135  K 3.5   < > 3.7 4.2 3.8  CL 113*   < > 104 104 102  CO2 13*   < > 21* 21* 25  GLUCOSE 506*   < > 340* 265* 222*  BUN 35*   < > 7 7 11   CREATININE 1.94*   < > 0.82 0.72 0.60  CALCIUM 8.5*   < > 8.3* 8.6* 8.7*  PROT 6.1*  --   --   --   --   ALBUMIN 3.4*  --   --   --   --   AST 34  --   --   --   --     ALT 44  --   --   --   --   ALKPHOS 95  --   --   --   --   BILITOT 1.7*  --   --   --   --   GFRNONAA 33*   < > >60 >60 >60  GFRAA 39*   < > >60 >60 >60  ANIONGAP 16*   < > 11 12 8    < > = values in this interval not displayed.     Hematology Recent Labs  Lab 11/26/19 1228 11/27/19 0222 11/28/19 0251  WBC 2.7* 3.2* 3.6*  RBC 3.47* 3.49* 3.13*  HGB 11.4* 11.4* 10.3*  HCT 34.0* 33.9* 30.4*  MCV 98.0 97.1 97.1  MCH 32.9 32.7 32.9  MCHC 33.5 33.6 33.9  RDW 14.4 14.1 13.8  PLT 105* 106* 132*    Cardiac EnzymesNo results for input(s): TROPONINI in the last 168 hours. No results for input(s): TROPIPOC in the last 168 hours.   BNPNo results for input(s): BNP, PROBNP in the last 168 hours.   DDimer No results for input(s): DDIMER in the last 168 hours.   Lipid Panel     Component Value Date/Time   CHOL 206 (H) 11/25/2019 0920   TRIG 79 11/25/2019 0920   HDL 98 11/25/2019 0920   CHOLHDL 2.1 11/25/2019 0920   VLDL 16 11/25/2019 0920   LDLCALC 92 11/25/2019 0920     Radiology    No results found.  Cardiac Studies    There is mild to moderate left ventricular systolic dysfunction.  LV end diastolic pressure is normal.  The left ventricular ejection fraction is 35-45% by visual estimate.  There is no mitral valve regurgitation.   1. No angiographic evidence of CAD 2. Moderate segmental LV systolic dysfunction  It is not entirely clear what is causing her elevated troponin and chest pain. No evidence of coronary dissection. Excellent flow down all vessels with no atherosclerotic disease. This could represent a stress induced cardiomyopathy but generally we would not see a troponin level over 27,000 in the setting of stress induced cardiomyopathy. Possible coronary spasm or resolved thrombus. Also cannot exclude PE.  Admit to the ICU under PCCM service for continued management of DKA.       ECHO 11/25/2019 IMPRESSIONS   1. Definity used; inferolateral and  apical hypokinesis with overall mild  LV dysfunction; grade 1 diastolic  dysfunction; moderate AI.  2. Left ventricular ejection fraction, by estimation, is 45 to 50%. The  left ventricle has mildly decreased function. The left ventricle  demonstrates regional wall motion abnormalities (see scoring  diagram/findings for description). Left ventricular  diastolic parameters are consistent with Grade I diastolic dysfunction  (impaired relaxation).  3. Right ventricular systolic function is normal. The right ventricular  size is normal.  4. The mitral valve is normal in structure. Trivial mitral valve  regurgitation. No evidence of mitral stenosis.  5. The aortic valve is tricuspid. Aortic valve regurgitation is moderate.  No aortic stenosis is present.  6. The inferior vena cava is dilated in size with >50% respiratory  variability, suggesting right atrial pressure of 8 mmHg.   Patient Profile     Ashaunte Standley is a 34 y.o. female with a hx of RA, tobacco abuse, DM, and asthma who was seen on 11/24/2019 for the evaluation of chest pain in the setting of DKA and troponin >27,000.   Assessment & Plan    1. Day 3 s/p ACS: I have personally from the emergent catheterization. The initial image raises concern for mid distal apical LAD vasospasm attributing to her wall motion abnormality probable earlier transient total occlusion and improvement in spasm.  Subsequent pictures show the distal LAD perfusionsignificantly improved from the initial image.  Initial ECG was consistent with a lateral STEMI and ECG 11/25/19 shows no ST segment changes with resolution of ST elevation.  I suspect her mid distal LAD wall motion abnormality most likely will be stunned myocardium rather than a reversible scar.  F/U ECG shows sinus rhythm with T wave inversion in leads I, aVL, V4 through V6.  QTc interval minimally increased at 467.  Echo shows EF at 45 to 50% with inferolateral and apical hypokinesis, grade  1 diastolic dysfunction, and moderate aortic insufficiency.   F/U echo as outpatient to re-assess LV fxn in 6 - 8 weeks,  2.  Essential hypertension: BP today is low in the mid 90s;  She continues to have sinus tachycardia ; coreg at 3.125 bid, not increased with low BP.  Will decrease amlodipine to 2.5 mg daily; started for potential vasospasm in etiolgy of NSTEMI with mid-distal LAD wall motian abnormality.   In future, depending upon blood pressure response, consider ARB therapy particularly with  diabetes mellitus.   3.  DKA: Patient admits to diabetes for 3 to 4 years.  At presentation  she was volume depleted BUN 35 creatinine 1.94 and glucose 506;  initially treated with IV insulin is now on Lantus and sliding scale.  4.  Right lower abdomen discomfort: CT scan  showed a large hematoma of the right pelvic sidewall extending from the right femoral artery along the right lower quadrant retroperitoneum.  She was seen by vascular surgery.  Stable hemoglobin at 11.4/hematocrit 33.9.       5.  Thrombocytopenia: Platelet count 106 -> 132 today.  5.  Hyperlipidemia: LDL 92; HDL 98. With  Diabetes mellitus, target LDL less than 70.  6.  Hypokalemia: Improved,   7.  AKI: Initial creatinine 1.95.  Improved with hydration; today 0.60  8.  Initial marked macrocytosis with MCV 113:     Repeat 97.  B12 level 587.  Folate 8.5.  9.  Tobacco abuse: Discussed the absolute importance of smoking cessation.  I previously discussed the vasoconstrictor effects of nicotine which may have been implicated in her vasospasm particularly in the setting of volume depletion  Probably ok to dc in am.  Signed, Lennette Bihari, MD, [facility] 11/28/2019, 5:23 PM

## 2019-11-28 NOTE — Progress Notes (Signed)
PROGRESS NOTE    Madison Williams  TFT:[id number] DOB: Aug 06, 1985 DOA: 11/23/2019 PCP: Patient, No Pcp Per   Brief Narrative:  HPI On 11/23/2019 by Dr. Virl Diamond  Feeling poorly for couple of days Not able to keep anything down Some abdominal discomfort Denies dysuria Denies contact with anybody with a febrile illness Has been admitted recently for DKA  Interim history Patient was admitted with DKA which is now resolved.  Patient noted to have ST EMI due to coronary vasospasm and cardiomyopathy, status post catheterization.  Cardiology following.  Was noted to have a hematoma post left heart cath, vascular surgery was consulted and has now signed off.  Assessment & Plan   DKA/diabetes mellitus type 1 with hyperglycemia -Patient did require ICU admission -She was on insulin drip however transition to sliding scale as well as Lantus and mealtime coverage -Increased sliding scale and lantus today -Hemoglobin A1c 10.8 on 10/17/2019  STEMI/cardiomyopathy -Cardiology consulted and appreciated -Echocardiogram showed an EF of 45 to 50%, grade 1 diastolic dysfunction -Status post heart catheterization: Mild to moderate LV systolic dysfunction.  EF 35 to 40% by visual estimate.  No angiographic evidence of CAD. -Likely secondary to coronary vasospasm -Patient will need echocardiogram and outpatient -Continue Coreg, aspirin -Would likely benefit from ACE inhibitor or ARB given underlying diabetes  Acute blood loss anemia secondary to retroperitoneal hematoma -Occurred after left heart catheterization -Hemoglobin is currently stable, 10.3 -Vascular surgery consulted and appreciated, and has now signed off  -Continue monitor CBC  Acute kidney injury -Likely secondary to DKA and dehydration -Creatinine was up to 1.95 -Was placed on IV fluids, creatinine down to 0.60 -Continue to monitor BMP  Essential hypertension -BP currently stable -Continue amlodipine, Coreg -As  above, would likely benefit from an ACE inhibitor or ARB given underlying diabetes  Thrombocytopenia -Platelets do appear to be improving, continue to monitor CBC  Sinus tachycardia -Secondary to episode with boyfriend this afternoon -Heart rate is improving -Likely secondary to anxiety, given low-dose Ativan  Anxiety -patient state she had a history of anxiety and was placed on Klonopin many years ago -Advised patient to seek a mental health counselor or psychiatrist on discharge. She may benefit from a SSRI  History of vaping/previous cigarette smoking -Smoking cessation discussed -Continue nicotine patch   DVT Prophylaxis  SCDs  Code Status: Full  Family Communication:  None at bedside  Disposition Plan:  Status is: Inpatient  Remains inpatient appropriate because:Hemodynamically unstable.  Titrating DM meds. Cardiology still titrating medications. TOC consulted for PCP and med assistance.   Dispo: The patient is from: Home              Anticipated d/c is to: Home              Anticipated d/c date is: 2 days              Patient currently is not medically stable to d/c.  Consultants Cardiology Vascular surgery PCCM  Procedures  Echocardiogram Left heart catheterization  Antibiotics   Anti-infectives (From admission, onward)   Start     Dose/Rate Route Frequency Ordered Stop   11/23/19 1800  cefTRIAXone (ROCEPHIN) 2 g in sodium chloride 0.9 % 100 mL IVPB  Status:  Discontinued     2 g 200 mL/hr over 30 Minutes Intravenous Every 24 hours 11/23/19 1725 11/25/19 1725      Subjective:   [name] seen and examined today.  Patient states she is feeling somewhat tired today.  Continues to have some soreness but feels better than previous days.  Denies chest pain or shortness of breath.  Denies abdominal pain, nausea or vomiting, dizziness or headache.  States she misses her child as well as her family.  Feels extremely anxious.    Objective:   Vitals:    11/28/19 0003 11/28/19 0417 11/28/19 0649 11/28/19 0805  BP: 104/70 104/66  104/72  Pulse:    89  Resp: (!) 21 20 (!) 21 20  Temp: 99.9 F (37.7 C) 98.3 F (36.8 C)  98.7 F (37.1 C)  TempSrc: Oral Oral  Oral  SpO2: 100% 100%  100%  Weight:   50.8 kg   Height:        Intake/Output Summary (Last 24 hours) at 11/28/2019 4098 Last data filed at 11/27/2019 1000 Gross per 24 hour  Intake 240 ml  Output --  Net 240 ml   Filed Weights   11/24/19 0500 11/25/19 0500 11/28/19 0649  Weight: 51.1 kg 52.7 kg 50.8 kg   Exam  General: Well developed, well nourished, NAD, appears stated age  HEENT: NCAT, mucous membranes moist.   Cardiovascular: S1 S2 auscultated, RRR, soft SEM  Respiratory: Clear to auscultation bilaterally   Abdomen: Soft, nontender, nondistended, + bowel sounds  Extremities: warm dry without cyanosis clubbing or edema  Neuro: AAOx3, nonfocal  Skin: Without rashes or nodules.  Dry and intact.  Multiple tattoos.  Psych: Pleasant, appropriate mood and affect  Data Reviewed: I have personally reviewed following labs and imaging studies  CBC: Recent Labs  Lab 11/24/19 0525 11/25/19 0134 11/26/19 0024 11/26/19 0749 11/26/19 1228 11/27/19 0222 11/28/19 0251  WBC 8.8   < > 2.5* 2.7* 2.7* 3.2* 3.6*  NEUTROABS 6.5  --   --   --   --  1.3*  --   HGB 13.0   < > 10.2* 10.2* 11.4* 11.4* 10.3*  HCT 38.2   < > 30.1* 31.7* 34.0* 33.9* 30.4*  MCV 97.2   < > 96.8 97.8 98.0 97.1 97.1  PLT 147*   < > 111* 92* 105* 106* 132*   < > = values in this interval not displayed.   Basic Metabolic Panel: Recent Labs  Lab 11/23/19 2118 11/24/19 0525 11/25/19 0541 11/25/19 1416 11/25/19 1805 11/26/19 0749 11/27/19 0222 11/28/19 0251  NA 142   < > 143  --  143 136 137 135  K 3.5   < > 3.0*  --  3.3* 3.7 4.2 3.8  CL 113*   < > 114*  --  110 104 104 102  CO2 13*   < > 18*  --  22 21* 21* 25  GLUCOSE 506*   < > 87  --  126* 340* 265* 222*  BUN 35*   < > 16  --  CREATININE 1.94*   < > 0.89  --  0.76 0.82 0.72 0.60  CALCIUM 8.5*   < > 8.3*  --  8.6* 8.3* 8.6* 8.7*  MG 2.3  --   --  1.7  --  2.1 2.1  --   PHOS 2.1*  --   --   --   --   --   --   --    < > = values in this interval not displayed.   GFR: Estimated Creatinine Clearance: 81 mL/min (by C-G formula based on SCr of 0.6 mg/dL). Liver Function Tests: Recent Labs  Lab 11/23/19 2118  AST 34  ALT 44  ALKPHOS 95  BILITOT 1.7*  PROT 6.1*  ALBUMIN 3.4*   Recent Labs  Lab 11/23/19 2118  LIPASE 26  AMYLASE 90   No results for input(s): AMMONIA in the last 168 hours. Coagulation Profile: Recent Labs  Lab 11/25/19 0134  INR 1.1   Cardiac Enzymes: No results for input(s): CKTOTAL, CKMB, CKMBINDEX, TROPONINI in the last 168 hours. BNP (last 3 results) No results for input(s): PROBNP in the last 8760 hours. HbA1C: No results for input(s): HGBA1C in the last 72 hours. CBG: Recent Labs  Lab 11/27/19 0743 11/27/19 1111 11/27/19 1603 11/27/19 2149 11/28/19 0647  GLUCAP 308* 265* 262* 327* 199*   Lipid Profile: No results for input(s): CHOL, HDL, LDLCALC, TRIG, CHOLHDL, LDLDIRECT in the last 72 hours. Thyroid Function Tests: No results for input(s): TSH, T4TOTAL, FREET4, T3FREE, THYROIDAB in the last 72 hours. Anemia Panel: No results for input(s): VITAMINB12, FOLATE, FERRITIN, TIBC, IRON, RETICCTPCT in the last 72 hours. Urine analysis:    Component Value Date/Time   COLORURINE STRAW (A) 11/23/2019 1152   APPEARANCEUR HAZY (A) 11/23/2019 1152   LABSPEC 1.014 11/23/2019 1152   PHURINE 5.0 11/23/2019 1152   GLUCOSEU >=500 (A) 11/23/2019 1152   HGBUR MODERATE (A) 11/23/2019 1152   BILIRUBINUR NEGATIVE 11/23/2019 1152   KETONESUR 80 (A) 11/23/2019 1152   PROTEINUR 100 (A) 11/23/2019 1152   NITRITE NEGATIVE 11/23/2019 1152   LEUKOCYTESUR NEGATIVE 11/23/2019 1152   Sepsis Labs: @LABRCNTIP (procalcitonin:4,lacticidven:4)  ) Recent Results (from the past 240 hour(s))   Respiratory Panel by RT PCR (Flu A&B, Covid) - Nasopharyngeal Swab     Status: None   Collection Time: 11/23/19 12:18 PM   Specimen: Nasopharyngeal Swab  Result Value Ref Range Status   SARS Coronavirus 2 by RT PCR NEGATIVE NEGATIVE Final    Comment: (NOTE) SARS-CoV-2 target nucleic acids are NOT DETECTED. The SARS-CoV-2 RNA is generally detectable in upper respiratoy specimens during the acute phase of infection. The lowest concentration of SARS-CoV-2 viral copies this assay can detect is 131 copies/mL. A negative result does not preclude SARS-Cov-2 infection and should not be used as the sole basis for treatment or other patient management decisions. A negative result may occur with  improper specimen collection/handling, submission of specimen other than nasopharyngeal swab, presence of viral mutation(s) within the areas targeted by this assay, and inadequate number of viral copies (<131 copies/mL). A negative result must be combined with clinical observations, patient history, and epidemiological information. The expected result is Negative. Fact Sheet for Patients:  11/25/19 Fact Sheet for Healthcare Providers:  https://www.moore.com/ This test is not yet ap proved or cleared by the https://www.young.biz/ FDA and  has been authorized for detection and/or diagnosis of SARS-CoV-2 by FDA under an Emergency Use Authorization (EUA). This EUA will remain  in effect (meaning this test can be used) for the duration of the COVID-19 declaration under Section 564(b)(1) of the Act, 21 U.S.C. section 360bbb-3(b)(1), unless the authorization is terminated or revoked sooner.    Influenza A by PCR NEGATIVE NEGATIVE Final   Influenza B by PCR NEGATIVE NEGATIVE Final    Comment: (NOTE) The Xpert Xpress SARS-CoV-2/FLU/RSV assay is intended as an aid in  the diagnosis of influenza from Nasopharyngeal swab specimens and  should not be used as a sole  basis for treatment. Nasal washings and  aspirates are unacceptable for Xpert Xpress SARS-CoV-2/FLU/RSV  testing. Fact Sheet for Patients: [place] Fact Sheet for Healthcare Providers: https://www.moore.com/ This test is not  yet approved or cleared by the [place] and  has been authorized for detection and/or diagnosis of SARS-CoV-2 by  FDA under an Emergency Use Authorization (EUA). This EUA will remain  in effect (meaning this test can be used) for the duration of the  Covid-19 declaration under Section 564(b)(1) of the Act, 21  U.S.C. section 360bbb-3(b)(1), unless the authorization is  terminated or revoked. Performed at [facility], [address] [place], [place] [address]   Culture, blood (routine x 2)     Status: None (Preliminary result)   Collection Time: 11/23/19  5:56 PM   Specimen: BLOOD LEFT HAND  Result Value Ref Range Status   Specimen Description   Final    BLOOD LEFT HAND Performed at [facility], [address], [place], [place] [address]    Special Requests   Final    BOTTLES DRAWN AEROBIC ONLY Blood Culture adequate volume Performed at [facility], [address] [place], [place] [address]    Culture   Final    NO GROWTH 4 DAYS Performed at [facility], [address], [place], [place] [address]    Report Status PENDING  Incomplete  Culture, blood (routine x 2)     Status: None (Preliminary result)   Collection Time: 11/23/19  5:56 PM   Specimen: BLOOD LEFT HAND  Result Value Ref Range Status   Specimen Description   Final    BLOOD LEFT HAND Performed at [facility], [address], [place], [place] [address]    Special Requests   Final    BOTTLES DRAWN AEROBIC ONLY Blood Culture results may not be optimal due to an inadequate volume of blood received in culture bottles Performed at [facility], [address] [place], [place] [address]    Culture   Final    NO GROWTH 4 DAYS Performed at [facility], [address], [place], [place] [address]    Report Status PENDING  Incomplete  MRSA PCR Screening     Status: None   Collection Time: 11/23/19  7:50 PM   Specimen: Nasal Mucosa; Nasopharyngeal  Result Value Ref Range Status   MRSA by PCR NEGATIVE NEGATIVE Final    Comment:        The GeneXpert MRSA Assay (FDA approved for NASAL specimens only), is one component of a comprehensive MRSA colonization surveillance program. It is not intended to diagnose MRSA infection nor to guide or monitor treatment for MRSA infections. Performed at [facility], [address], [place], [place] [address]       Radiology Studies: No results found.   Scheduled Meds: . amLODipine  5 mg Oral Daily  . aspirin EC  81 mg Oral Daily  . carvedilol  3.125 mg Oral BID WC  . Chlorhexidine Gluconate Cloth  6 each Topical Daily  . insulin aspart  0-5 Units Subcutaneous QHS  . insulin aspart  0-9 Units Subcutaneous TID WC  . insulin aspart  4 Units Subcutaneous TID WC  . insulin glargine  30 Units Subcutaneous Daily  . mouth rinse  15 mL Mouth Rinse BID  . metoCLOPramide  5 mg Oral TID AC  . nicotine  14 mg Transdermal Daily  . pantoprazole  40 mg Oral QHS  . sodium chloride flush  3 mL Intravenous Q12H   Continuous Infusions: . sodium chloride       LOS: 5 days  Time Spent in minutes   30 minutes  Nyheem Binette D.O. on 11/28/2019 at 9:21 AM  Between 7am to 7pm - Please see pager noted on [email or web address]  After 7pm go to [email or web address]  And look for the night coverage person covering for me after hours  [facility] Office  [phone]

## 2019-11-29 ENCOUNTER — Other Ambulatory Visit: Payer: Self-pay | Admitting: Physician Assistant

## 2019-11-29 DIAGNOSIS — E782 Mixed hyperlipidemia: Secondary | ICD-10-CM

## 2019-11-29 DIAGNOSIS — I420 Dilated cardiomyopathy: Secondary | ICD-10-CM

## 2019-11-29 LAB — BASIC METABOLIC PANEL
Anion gap: 9 (ref 5–15)
BUN: 9 mg/dL (ref 6–20)
CO2: 25 mmol/L (ref 22–32)
Calcium: 8.5 mg/dL — ABNORMAL LOW (ref 8.9–10.3)
Chloride: 102 mmol/L (ref 98–111)
Creatinine, Ser: 0.66 mg/dL (ref 0.44–1.00)
GFR calc Af Amer: >60 mL/min (ref >60)
GFR calc non Af Amer: >60 mL/min (ref >60)
Glucose, Bld: 243 mg/dL — ABNORMAL HIGH (ref 70–99)
Potassium: 4.1 mmol/L (ref 3.5–5.1)
Sodium: 136 mmol/L (ref 135–145)

## 2019-11-29 LAB — CBC
HCT: 28.9 % — ABNORMAL LOW (ref 36.0–46.0)
Hemoglobin: 9.5 g/dL — ABNORMAL LOW (ref 12.0–15.0)
MCH: 32.4 pg (ref 26.0–34.0)
MCHC: 32.9 g/dL (ref 30.0–36.0)
MCV: 98.6 fL (ref 80.0–100.0)
Platelets: 217 10*3/uL (ref 150–400)
RBC: 2.93 MIL/uL — ABNORMAL LOW (ref 3.87–5.11)
RDW: 13.7 % (ref 11.5–15.5)
WBC: 4.7 10*3/uL (ref 4.0–10.5)
nRBC: 0 % (ref 0.0–0.2)

## 2019-11-29 LAB — GLUCOSE, CAPILLARY: Glucose-Capillary: 235 mg/dL — ABNORMAL HIGH (ref 70–99)

## 2019-11-29 MED ORDER — POLYETHYLENE GLYCOL 3350 17 G PO PACK: 17.0000 g | PACK | Freq: Every day | ORAL | 0 refills | Status: DC | PRN

## 2019-11-29 MED ORDER — ASPIRIN 81 MG PO TBEC: 81.0000 mg | DELAYED_RELEASE_TABLET | Freq: Every day | ORAL | 0 refills | Status: DC

## 2019-11-29 MED ORDER — AMLODIPINE BESYLATE 2.5 MG PO TABS: 2.5000 mg | ORAL_TABLET | Freq: Every day | ORAL | 0 refills | Status: DC

## 2019-11-29 MED ORDER — PANTOPRAZOLE SODIUM 40 MG PO TBEC: 40.0000 mg | DELAYED_RELEASE_TABLET | Freq: Every day | ORAL | 0 refills | Status: DC

## 2019-11-29 MED ORDER — PROMETHAZINE HCL 12.5 MG PO TABS
12.5000 mg | ORAL_TABLET | Freq: Four times a day (QID) | ORAL | 0 refills | Status: DC | PRN
Start: 2019-11-29 — End: 2020-01-18

## 2019-11-29 MED ORDER — CARVEDILOL 3.125 MG PO TABS: 3.1250 mg | ORAL_TABLET | Freq: Two times a day (BID) | ORAL | 0 refills | Status: DC

## 2019-11-29 MED ORDER — OXYCODONE HCL 5 MG PO TABS: 5.0000 mg | ORAL_TABLET | Freq: Four times a day (QID) | ORAL | 0 refills | Status: AC | PRN

## 2019-11-29 MED ORDER — NICOTINE 14 MG/24HR TD PT24: 14.0000 mg | MEDICATED_PATCH | Freq: Every day | TRANSDERMAL | 0 refills | Status: DC

## 2019-11-29 MED ORDER — INSULIN ASPART 100 UNIT/ML ~~LOC~~ SOLN: 4.0000 [IU] | Freq: Three times a day (TID) | SUBCUTANEOUS | 0 refills | Status: DC

## 2019-11-29 MED ORDER — INSULIN GLARGINE 100 UNIT/ML ~~LOC~~ SOLN: 34.0000 [IU] | Freq: Every day | SUBCUTANEOUS | 1 refills | Status: DC

## 2019-11-29 NOTE — Progress Notes (Signed)
Patient took herself off of the cardiac monitor and pulled her own IV out.  She refused all of her medications and demanded to be discharged right now.  Previous conservation with patient was to discharge between 11-12 and she was to call her ride when ready.

## 2019-11-29 NOTE — Progress Notes (Signed)
During bedside report this morning, the patient told this nurse that she was leaving today whether she was discharged or whether she leaving AMA.  Updated physician on patients remarks.

## 2019-11-29 NOTE — Progress Notes (Addendum)
Discharge instructions given to Madison Williams.  Pt did not want to listen to discharge, just wanted to leave.  Patient was standing at the doorway , explained to patient that I was going to get her paper work and a wheel chair.  The patient  followed this nurse down the hall to the nurses station, stating in a loud voice that "now she was having to walk with an open wound".  Reminded patient that I had asked her to wait for a wheelchair.  Patient is a loud voice stated that she had been waiting for a hour for her pain medications and nicotine patch, however she refused them when I brought them to her.  Patient was very sarcastic to this nurse making hateful remarks to this nurse when she was taken to the car and the door was opened for her.

## 2019-11-29 NOTE — Plan of Care (Signed)

## 2019-11-29 NOTE — Discharge Instructions (Signed)
Diabetic Ketoacidosis °Diabetic ketoacidosis is a serious complication of diabetes. This condition develops when there is not enough insulin in the body. Insulin is an hormone that regulates blood sugar levels in the body. Normally, insulin allows glucose to enter the cells in the body. The cells break down glucose for energy. Without enough insulin, the body cannot break down glucose, so it breaks down fats instead. This leads to high blood glucose levels in the body and the production of acids that are called ketones. Ketones are poisonous at high levels. °If diabetic ketoacidosis is not treated, it can cause severe dehydration and can lead to a coma or death. °What are the causes? °This condition develops when a lack of insulin causes the body to break down fats instead of glucose. This may be triggered by: °· Stress on the body. This stress is brought on by an illness. °· Infection. °· Medicines that raise blood glucose levels. °· Not taking diabetes medicine. °· New onset of type 1 diabetes mellitus. °What are the signs or symptoms? °Symptoms of this condition include: °· Fatigue. °· Weight loss. °· Excessive thirst. °· Light-headedness. °· Fruity or sweet-smelling breath. °· Excessive urination. °· Vision changes. °· Confusion or irritability. °· Nausea. °· Vomiting. °· Rapid breathing. °· Abdominal pain. °· Feeling flushed. °How is this diagnosed? °This condition is diagnosed based on your medical history, a physical exam, and blood tests. You may also have a urine test to check for ketones. °How is this treated? °This condition may be treated with: °· Fluid replacement. This may be done to correct dehydration. °· Insulin injections. These may be given through the skin or through an IV tube. °· Electrolyte replacement. Electrolytes are minerals in your blood. Electrolytes such as potassium and sodium may be given in pill form or through an IV tube. °· Antibiotic medicines. These may be prescribed if your  condition was caused by an infection. °Diabetic ketoacidosis is a serious medical condition. You may need emergency treatment in the hospital to monitor your condition. °Follow these instructions at home: °Eating and drinking °· Drink enough fluids to keep your urine clear or pale yellow. °· If you are not able to eat, drink clear fluids in small amounts as you are able. Clear fluids include water, ice chips, fruit juice with water added (diluted), and low-calorie sports drinks. You may also have sugar-free jello or popsicles. °· If you are able to eat, follow your usual diet and drink sugar-free liquids, such as water. °Medicines °· Take over-the-counter and prescription medicines only as told by your health care provider. °· Continue to take insulin and other diabetes medicines as told by your health care provider. °· If you were prescribed an antibiotic, take it as told by your health care provider. Do not stop taking the antibiotic even if you start to feel better. °General instructions ° °· Check your urine for ketones when you are ill and as told by your health care provider. °? If your blood glucose is 240 mg/dL (13.3 mmol/L) or higher, check your urine ketones every 4-6 hours. °· Check your blood glucose every day, as often as told by your health care provider. °? If your blood glucose is high, drink plenty of fluids. This helps to flush out ketones. °? If your blood glucose is above your target for 2 tests in a row, contact your health care provider. °· Carry a medical alert card or wear medical alert jewelry that says that you have diabetes. °· Rest   and exercise only as told by your health care provider. Do not exercise when your blood glucose is high and you have ketones in your urine. °· If you get sick, call your health care provider and begin treatment quickly. Your body often needs extra insulin to fight an illness. Check your blood glucose every 4-6 hours when you are sick. °· Keep all follow-up  visits as told by your health care provider. This is important. °Contact a health care provider if: °· Your blood glucose level is higher than 240 mg/dL (13.3 mmol/L) for 2 days in a row. °· You have moderate or large ketones in your urine. °· You have a fever. °· You cannot eat or drink without vomiting. °· You have been vomiting for more than 2 hours. °· You continue to have symptoms of diabetic ketoacidosis. °· You develop new symptoms. °Get help right away if: °· Your blood glucose monitor reads “high” even when you are taking insulin. °· You faint. °· You have chest pain. °· You have trouble breathing. °· You have sudden trouble speaking or swallowing. °· You have vomiting or diarrhea that gets worse after 3 hours. °· You are unable to stay awake. °· You have trouble thinking. °· You are severely dehydrated. Symptoms of severe dehydration include: °? Extreme thirst. °? Dry mouth. °? Rapid breathing. °These symptoms may represent a serious problem that is an emergency. Do not wait to see if the symptoms will go away. Get medical help right away. Call your local emergency services (911 in the [place] Do not drive yourself to the hospital. °Summary °· Diabetic ketoacidosis is a serious complication of diabetes. This condition develops when there is not enough insulin in the body. °· This condition is diagnosed based on your medical history, a physical exam, and blood tests. You may also have a urine test to check for ketones. °· Diabetic ketoacidosis is a serious medical condition. You may need emergency treatment in the hospital to monitor your condition. °· Contact your health care provider if your blood glucose is higher than 240 mg/dl for 2 days in a row or if you have moderate or large ketones in your urine. °This information is not intended to replace advice given to you by your health care provider. Make sure you discuss any questions you have with your health care provider. °Document Revised: 09/01/2016  Document Reviewed: 08/21/2016 °Elsevier Patient Education © 2020 [employer] ° °

## 2019-11-29 NOTE — Care Management (Signed)
Discussed MATCH letter with patient. Patient is familiar as this will be her 3rd MATCH in the last year.  Generally, the rules are no more than one/year.  Patient has no current plan to fill prescriptions and states she has either used MATCH or bought [facility] insulin OTC to fill scripts in the last year. Patient states the OTC insulin "made her sick" and  caused her to be readmitted.    Patient states she has attempted to establish with [facility] and missed 2 appointments because of miscommunication regarding virtual/face-to-face, etc.  Patient seems willing to f/u and is interested in pursuing other clinics if there is a problem with upcoming appointment at [facility].  Patient provided information for [facility] and [facility].    Patient agreeable to insulin vials and grateful for assistance.

## 2019-11-29 NOTE — Progress Notes (Signed)
Progress Note  Patient Name: Madison Williams Date of Encounter: 11/29/2019  Primary Cardiologist: new; Dr. Julianne Handler  Subjective   She feels anxious, but denies any further chest pain or SOB. She is insisting on going home.   Inpatient Medications    Scheduled Meds: . amLODipine  2.5 mg Oral Daily  . aspirin EC  81 mg Oral Daily  . carvedilol  3.125 mg Oral BID WC  . Chlorhexidine Gluconate Cloth  6 each Topical Daily  . insulin aspart  0-5 Units Subcutaneous QHS  . insulin aspart  0-9 Units Subcutaneous TID WC  . insulin aspart  4 Units Subcutaneous TID WC  . insulin glargine  34 Units Subcutaneous Daily  . mouth rinse  15 mL Mouth Rinse BID  . metoCLOPramide  5 mg Oral TID AC  . nicotine  14 mg Transdermal Daily  . pantoprazole  40 mg Oral QHS  . sodium chloride flush  3 mL Intravenous Q12H   Continuous Infusions: . sodium chloride     PRN Meds: sodium chloride, acetaminophen (TYLENOL) oral liquid 160 mg/5 mL, albuterol, alum & mag hydroxide-simeth **AND** [COMPLETED] lidocaine, dextrose, docusate sodium, ondansetron (ZOFRAN) IV, oxyCODONE, polyethylene glycol, promethazine, sodium chloride flush   Vital Signs    Vitals:   11/28/19 2021 11/28/19 2350 11/29/19 0448 11/29/19 0454  BP: 96/71 93/65 91/63    Pulse:      Resp: 19 20 (!) 27 20  Temp:  99.3 F (37.4 C) 98.7 F (37.1 C)   TempSrc:  Oral Oral   SpO2:  100% 97%   Weight:   51.3 kg   Height:        Intake/Output Summary (Last 24 hours) at 11/29/2019 0828 Last data filed at 11/28/2019 1500 Gross per 24 hour  Intake 480 ml  Output -  Net 480 ml    I/O since admission:  +[phone]  [facility] Weights   11/25/19 0500 11/28/19 0649 11/29/19 0448  Weight: 52.7 kg 50.8 kg 51.3 kg    Telemetry    Sinus tachycardia persists with heart rates 120-1 25.  She states that she is more anxious today.  Personally Reviewed  ECG    11/26/2019 ECG (independently read by me): Normal sinus rhythm at 79 bpm.  T wave  inversion in leads I aVL, V4 to V6.  QTc interval 467 ms.  11/25/2019 ECG (independently read by me): NSR at 84; no significant STT changes: QTc 482 msec  11/24/2019 ECG (independently read by me): Normal sinus rhythm at 88 bpm with ST elevation inferiorly, I avL and in leads V3 through V6.  QTc interval 46 ms with T wave inversion V2-3.  Physical Exam   BP 91/63 (BP Location: Right Arm)   Pulse (!) 111   Temp 98.7 F (37.1 C) (Oral)   Resp 20   Ht 5\' 7"  (1.702 m)   Wt 51.3 kg   LMP 03/16/2018   SpO2 97%   BMI 17.71 kg/m  General: Alert, oriented, no distress.  Skin: normal turgor, no rashes, warm and dry HEENT: Normocephalic, atraumatic. Pupils equal round and reactive to light; sclera anicteric; extraocular muscles intact;  Nose without nasal septal hypertrophy Mouth/Parynx benign; z Neck: No JVD, no carotid bruits; normal carotid upstroke Lungs: clear to ausculatation and percussion; no wheezing or rales Chest wall: without tenderness to palpitation Heart: PMI not displaced, RRR, s1 s2 normal, 1/6 systolic murmur, no diastolic murmur, no rubs, gallops, thrills, or heaves Abdomen: soft, nontender; no hepatosplenomehaly, BS+; abdominal  aorta nontender and not dilated by palpation. Back: no CVA tenderness Pulses 2+ Musculoskeletal: full range of motion, normal strength, no joint deformities Extremities: no clubbing cyanosis or edema, Homan's sign negative  Neurologic: grossly nonfocal; Cranial nerves grossly wnl Psychologic: Normal mood and affect    Labs    Chemistry Recent Labs  Lab 11/23/19 2118 11/24/19 0525 11/26/19 0749 11/27/19 0222 11/28/19 0251  NA 142   < > 136 137 135  K 3.5   < > 3.7 4.2 3.8  CL 113*   < > 104 104 102  CO2 13*   < > 21* 21* 25  GLUCOSE 506*   < > 340* 265* 222*  BUN 35*   < > 7 7 11   CREATININE 1.94*   < > 0.82 0.72 0.60  CALCIUM 8.5*   < > 8.3* 8.6* 8.7*  PROT 6.1*  --   --   --   --   ALBUMIN 3.4*  --   --   --   --   AST 34  --    --   --   --   ALT 44  --   --   --   --   ALKPHOS 95  --   --   --   --   BILITOT 1.7*  --   --   --   --   GFRNONAA 33*   < > >60 >60 >60  GFRAA 39*   < > >60 >60 >60  ANIONGAP 16*   < > 11 12 8    < > = values in this interval not displayed.     Hematology Recent Labs  Lab 11/27/19 0222 11/28/19 0251 11/29/19 0712  WBC 3.2* 3.6* 4.7  RBC 3.49* 3.13* 2.93*  HGB 11.4* 10.3* 9.5*  HCT 33.9* 30.4* 28.9*  MCV 97.1 97.1 98.6  MCH 32.7 32.9 32.4  MCHC 33.6 33.9 32.9  RDW 14.1 13.8 13.7  PLT 106* 132* 217    Cardiac EnzymesNo results for input(s): TROPONINI in the last 168 hours. No results for input(s): TROPIPOC in the last 168 hours.   BNPNo results for input(s): BNP, PROBNP in the last 168 hours.   DDimer No results for input(s): DDIMER in the last 168 hours.   Lipid Panel     Component Value Date/Time   CHOL 206 (H) 11/25/2019 0920   TRIG 79 11/25/2019 0920   HDL 98 11/25/2019 0920   CHOLHDL 2.1 11/25/2019 0920   VLDL 16 11/25/2019 0920   LDLCALC 92 11/25/2019 0920     Radiology    No results found.  Cardiac Studies    There is mild to moderate left ventricular systolic dysfunction.  LV end diastolic pressure is normal.  The left ventricular ejection fraction is 35-45% by visual estimate.  There is no mitral valve regurgitation.   1. No angiographic evidence of CAD 2. Moderate segmental LV systolic dysfunction  It is not entirely clear what is causing her elevated troponin and chest pain. No evidence of coronary dissection. Excellent flow down all vessels with no atherosclerotic disease. This could represent a stress induced cardiomyopathy but generally we would not see a troponin level over 27,000 in the setting of stress induced cardiomyopathy. Possible coronary spasm or resolved thrombus. Also cannot exclude PE.  Admit to the ICU under PCCM service for continued management of DKA.       ECHO 11/25/2019 IMPRESSIONS   1. Definity used;  inferolateral and apical hypokinesis with overall  mild  LV dysfunction; grade 1 diastolic dysfunction; moderate AI.  2. Left ventricular ejection fraction, by estimation, is 45 to 50%. The  left ventricle has mildly decreased function. The left ventricle  demonstrates regional wall motion abnormalities (see scoring  diagram/findings for description). Left ventricular  diastolic parameters are consistent with Grade I diastolic dysfunction  (impaired relaxation).  3. Right ventricular systolic function is normal. The right ventricular  size is normal.  4. The mitral valve is normal in structure. Trivial mitral valve  regurgitation. No evidence of mitral stenosis.  5. The aortic valve is tricuspid. Aortic valve regurgitation is moderate.  No aortic stenosis is present.  6. The inferior vena cava is dilated in size with >50% respiratory  variability, suggesting right atrial pressure of 8 mmHg.   Patient Profile     Madison Williams is a 34 y.o. female with a hx of RA, tobacco abuse, DM, and asthma who was seen on 11/24/2019 for the evaluation of chest pain in the setting of DKA and troponin >27,000.   Assessment & Plan    1. Day 3 s/p ACS:  The initial image raises concern for mid distal apical LAD vasospasm attributing to her wall motion abnormality probable earlier transient total occlusion and improvement in spasm.  Subsequent pictures show the distal LAD perfusionsignificantly improved from the initial image.  Initial ECG was consistent with a lateral STEMI and ECG 11/25/19 shows no ST segment changes with resolution of ST elevation.  I suspect her mid distal LAD wall motion abnormality most likely will be stunned myocardium rather than a reversible scar.  F/U ECG shows sinus rhythm with T wave inversion in leads I, aVL, V4 through V6.  QTc interval minimally increased at 467.  Echo shows EF at 45 to 50% with inferolateral and apical hypokinesis, grade 1 diastolic dysfunction, and  moderate aortic insufficiency.   F/U echo as outpatient to re-assess LV fxn in 6 - 8 weeks, we will arrange  - she remains borderline tachycardic, I am unable to increase carvedilol given hypotension, we will try at the next outpatient visit - strongly advised against restarting smoking   2.  Essential hypertension: BP today is low in the mid 90s;  Continue amlodipine to 2.5 mg daily; started for potential vasospasm in etiolgy of NSTEMI with mid-distal LAD wall motian abnormality.   In future, depending upon blood pressure response, consider ARB therapy particularly with  diabetes mellitus.   3.  DKA: Patient admits to diabetes for 3 to 4 years.  At presentation  she was volume depleted BUN 35 creatinine 1.94 and glucose 506;  initially treated with IV insulin is now on Lantus and sliding scale.  4.  Right lower abdomen discomfort: CT scan  showed a large hematoma of the right pelvic sidewall extending from the right femoral artery along the right lower quadrant retroperitoneum.  She was seen by vascular surgery.  Stable hemoglobin at 11.4/hematocrit 33.9.       5.  Thrombocytopenia: Platelet count 106 -> 132 today.  5.  Hyperlipidemia: LDL 92; HDL 98. With  Diabetes mellitus, target LDL less than 70.  6.  Hypokalemia: Improved,   7.  AKI: Initial creatinine 1.95.  Improved with hydration; today 0.60  8.  Initial marked macrocytosis with MCV 113:     Repeat 97.  B12 level 587.  Folate 8.5.  9.  Tobacco abuse: Discussed the absolute importance of smoking cessation.  I previously discussed the vasoconstrictor effects of nicotine which  may have been implicated in her vasospasm particularly in the setting of volume depletion  [facility] will sign off.   Medication Recommendations:  As above Other recommendations (labs, testing, etc):  We will arrange for an outpatient follow up echo in 6-8 weeks.  Follow up as an outpatient:  We will arrange for an early follow up in our clinic within 7  days.   Signed, Lennette Bihari, MD, [facility] 11/29/2019, 8:28 AM

## 2019-11-29 NOTE — Discharge Summary (Signed)
Physician Discharge Summary  [facility] RJJ:[id number] DOB: Jan 13, 1986 DOA: 11/23/2019  PCP: Madison Williams, No Pcp Per  Admit date: 11/23/2019 Discharge date: 11/29/2019  Time spent: 45 minutes  Recommendations for Outpatient Follow-up:  Madison Williams will be discharged to home.  Madison Williams will need to follow up with primary care provider within one week of discharge, repeat CBC.  Follow-up with cardiology.  Madison Williams should continue medications as prescribed.  Madison Williams should follow a heart healthy/carb modified diet.   Discharge Diagnoses:  DKA/diabetes mellitus type 1 with hyperglycemia STEMI/cardiomyopathy Acute blood loss anemia secondary to retroperitoneal hematoma Acute kidney injury Essential hypertension Thrombocytopenia Sinus tachycardia Anxiety History of vaping/previous cigarette smoking  Discharge Condition: Stable  Diet recommendation: heart healthy/carb modified  Filed Weights   11/25/19 0500 11/28/19 0649 11/29/19 0448  Weight: 52.7 kg 50.8 kg 51.3 kg    History of present illness:  On 11/23/2019 by Dr. Virl Diamond  Feeling poorly for couple of days Not able to keep anything down Some abdominal discomfort Denies dysuria Denies contact with anybody with a febrile illness Has been admitted recently for DKA  Hospital Course:  DKA/diabetes mellitus type 1 with hyperglycemia -Madison Williams did require ICU admission -She was on insulin drip however transition to sliding scale as well as Lantus and mealtime coverage -Continue lantus and meal time coverage -Hemoglobin A1c 10.8 on 10/17/2019  STEMI/cardiomyopathy -Cardiology consulted and appreciated -Echocardiogram showed an EF of 45 to 50%, grade 1 diastolic dysfunction -Status post heart catheterization: Mild to moderate LV systolic dysfunction.  EF 35 to 40% by visual estimate.  No angiographic evidence of CAD. -Likely secondary to coronary vasospasm -Madison Williams will need echocardiogram and outpatient -Continue Coreg,  aspirin -Would likely benefit from ACE inhibitor or ARB given underlying diabetes- however Madison Williams with soft BP  Acute blood loss anemia secondary to retroperitoneal hematoma -Occurred after left heart catheterization -Hemoglobin is currently stable, 9.5 -Vascular surgery consulted and appreciated, and has now signed off  -repeat CBC in one week  Acute kidney injury -Likely secondary to DKA and dehydration -Creatinine was up to 1.95 -Was placed on IV fluids, creatinine down to 0.66  Essential hypertension -BP currently stable -Continue amlodipine, Coreg -As above, would likely benefit from an ACE inhibitor or ARB given underlying diabetes  Thrombocytopenia -Resolved  Sinus tachycardia -Secondary to episode with boyfriend this afternoon -Heart rate is improving -Likely secondary to anxiety, given low-dose Ativan  Anxiety -Madison Williams state she had a history of anxiety and was placed on Klonopin many years ago -Advised Madison Williams to seek a mental health counselor or psychiatrist on discharge. She may benefit from a SSRI  History of vaping/previous cigarette smoking -Smoking cessation discussed -Continue nicotine patch   Consultants Cardiology Vascular surgery PCCM  Procedures  Echocardiogram Left heart catheterization  Discharge Exam: Vitals:   11/29/19 0828 11/29/19 0919  BP: 97/63 93/61  Pulse: (!) 115 (!) 102  Resp: 16 14  Temp: 98 F (36.7 C) 98.2 F (36.8 C)  SpO2: 99% 97%    Exam  General: Well developed, well nourished, NAD, appears stated age  HEENT: NCAT, mucous membranes moist.   Cardiovascular: S1 S2 auscultated, RRR, soft SEM  Respiratory: Clear to auscultation bilaterally   Abdomen: Soft, nontender, nondistended, + bowel sounds  Extremities: warm dry without cyanosis clubbing or edema  Neuro: AAOx3, nonfocal  Skin: Without rashes or nodules.  Dry and intact. Multiple tattoos.  Psych: Pleasant, appropriate mood and  affect   Discharge Instructions Discharge Instructions    Discharge instructions  Complete by: As directed    Madison Williams will be discharged to home.  Madison Williams will need to follow up with primary care provider within one week of discharge.  Madison Williams should continue medications as prescribed, repeat CBC.  Follow up with cardiology. Madison Williams should follow a heart healthy/carb modified diet.     Allergies as of 11/29/2019   No Known Allergies     Medication List    TAKE these medications   albuterol 108 (90 Base) MCG/ACT inhaler Commonly known as: VENTOLIN HFA Inhale 2 puffs into the lungs every 6 (six) hours as needed for wheezing or shortness of breath.   amLODipine 2.5 MG tablet Commonly known as: NORVASC Take 1 tablet (2.5 mg total) by mouth daily.   aspirin 81 MG EC tablet Take 1 tablet (81 mg total) by mouth daily.   carvedilol 3.125 MG tablet Commonly known as: COREG Take 1 tablet (3.125 mg total) by mouth 2 (two) times daily with a meal.   insulin aspart 100 UNIT/ML injection Commonly known as: novoLOG Inject 4 Units into the skin 3 (three) times daily with meals. What changed: how much to take   insulin glargine 100 UNIT/ML injection Commonly known as: LANTUS Inject 0.34 mLs (34 Units total) into the skin daily. What changed: how much to take   nicotine 14 mg/24hr patch Commonly known as: NICODERM CQ - dosed in mg/24 hours Place 1 patch (14 mg total) onto the skin daily.   oxyCODONE 5 MG immediate release tablet Commonly known as: Roxicodone Take 1 tablet (5 mg total) by mouth every 6 (six) hours as needed for up to 5 days.   pantoprazole 40 MG tablet Commonly known as: PROTONIX Take 1 tablet (40 mg total) by mouth at bedtime.   polyethylene glycol 17 g packet Commonly known as: MIRALAX / GLYCOLAX Take 17 g by mouth daily as needed for moderate constipation.   promethazine 12.5 MG tablet Commonly known as: PHENERGAN Take 1 tablet (12.5 mg total) by mouth  every 6 (six) hours as needed for nausea or vomiting.      No Known Allergies Follow-up Information    [facility]. Go on 12/03/2019.   Why: You have a followup appointment with [facility] for primary care visit on Wednesday, May 5 at 3:30 pm. Contact information: [address] [place] [address] [phone]       Kathleene Hazel, MD Follow up in 1 week(s).   Specialty: Cardiology Why: The office will call to arrange a follow up with Dr. Clifton James or one of the PAs or NPs on his team. Contact information: [address] [place] [address] [phone]        [facility] Follow up in 8 week(s).   Why: You will be set up for an echocardiogram in 6-8 weeks at Dr. Gibson Ramp office. Contact information: [address] [place] [address] [phone]           The results of significant diagnostics from this hospitalization (including imaging, microbiology, ancillary and laboratory) are listed below for reference.    Significant Diagnostic Studies: CT ABDOMEN PELVIS W CONTRAST  Result Date: 11/24/2019 CLINICAL DATA:  Right lower quadrant pain. Status post cardiac catheterization. DKA. EXAM: CT ABDOMEN AND PELVIS WITH CONTRAST TECHNIQUE: Multidetector CT imaging of the abdomen and pelvis was performed using the standard protocol following bolus administration of intravenous contrast. CONTRAST:  OMNIPAQUE  IOHEXOL 300 MG/ML  SOLN COMPARISON:  None. FINDINGS: LOWER CHEST: Normal. HEPATOBILIARY: Normal hepatic contours. No intra- or extrahepatic biliary dilatation. The gallbladder is normal. Small amount of fluid along the inferior hepatic margin. PANCREAS: Normal pancreas. No ductal dilatation or peripancreatic fluid collection. SPLEEN: Normal. ADRENALS/URINARY TRACT: The adrenal glands are normal. No  hydronephrosis, nephroureterolithiasis or solid renal mass. The urinary bladder is normal for degree of distention STOMACH/BOWEL: There is no hiatal hernia. Normal duodenal course and caliber. No small bowel dilatation or inflammation. No focal colonic abnormality. Normal appendix. VASCULAR/LYMPHATIC: There is a large hematoma of the right pelvic side wall extending from the right femoral. The hematoma measures approximately 7.5 x 3 cm and displaces the urinary bladder and uterus to the left. This also extends along the right posterior retroperitoneum. No abdominal or pelvic lymphadenopathy. REPRODUCTIVE: Uterus is normal. No adnexal mass. MUSCULOSKELETAL. No bony spinal canal stenosis or focal osseous abnormality. OTHER: None. IMPRESSION: 1. Large hematoma of the right pelvic side wall extending from the right femoral artery and along the right lower quadrant retroperitoneum. No active extravasation is seen, but this is a non-angiographic study. 2. Small amount of fluid along the inferior hepatic margin and extending inferiorly toward pelvis. Critical Value/emergent results were called by telephone at the time of interpretation on 11/24/2019 at 11:36 pm to provider Juanetta Snow , who verbally acknowledged these results. Electronically Signed   By: Deatra Robinson M.D.   On: 11/24/2019 23:37   CARDIAC CATHETERIZATION  Result Date: 11/24/2019  There is mild to moderate left ventricular systolic dysfunction.  LV end diastolic pressure is normal.  The left ventricular ejection fraction is 35-45% by visual estimate.  There is no mitral valve regurgitation.  1. No angiographic evidence of CAD 2. Moderate segmental LV systolic dysfunction It is not entirely clear what is causing her elevated troponin and chest pain. No evidence of coronary dissection. Excellent flow down all vessels with no atherosclerotic disease. This could represent a stress induced cardiomyopathy but generally we would not see a troponin level  over 27,000 in the setting of stress induced cardiomyopathy. Possible coronary spasm or resolved thrombus. Also cannot exclude PE. Admit to the ICU under PCCM service for continued management of DKA.   DG CHEST PORT 1 VIEW  Result Date: 11/23/2019 CLINICAL DATA:  Short of breath, fever, diabetic ketoacidosis EXAM: PORTABLE CHEST 1 VIEW COMPARISON:  10/16/2019 FINDINGS: Single frontal view of the chest demonstrates a stable cardiac silhouette. Chronic interstitial scarring throughout the lungs without airspace disease, effusion, or pneumothorax. No acute bony abnormalities. IMPRESSION: 1. No acute intrathoracic process. Electronically Signed   By: Sharlet Salina M.D.   On: 11/23/2019 19:22   ECHOCARDIOGRAM COMPLETE  Result Date: 11/25/2019    ECHOCARDIOGRAM REPORT   Madison Williams Name:   Madison Williams Date of Exam: 11/25/2019 Medical Rec #:  [id number]             Height:       67.0 in Accession #:    [id number]            Weight:       116.2 lb Date of Birth:  Mar 18, 1986              BSA:          1.605 m Madison Williams Age:    33 years              BP:           116/76 mmHg  Madison Williams Gender: F                     HR:           95 bpm. Exam Location:  Inpatient Procedure: 2D Echo Indications:    CAD Native Vessel I25.10  History:        Madison Williams has prior history of Echocardiogram examinations, most                 recent 01/31/2019. Risk Factors:Diabetes.  Sonographer:    Thurman Coyer RDCS (AE) Referring Phys: [id number] CHRISTOPHER D MCALHANY IMPRESSIONS  1. Definity used; inferolateral and apical hypokinesis with overall mild LV dysfunction; grade 1 diastolic dysfunction; moderate AI.  2. Left ventricular ejection fraction, by estimation, is 45 to 50%. The left ventricle has mildly decreased function. The left ventricle demonstrates regional wall motion abnormalities (see scoring diagram/findings for description). Left ventricular diastolic parameters are consistent with Grade I diastolic dysfunction (impaired  relaxation).  3. Right ventricular systolic function is normal. The right ventricular size is normal.  4. The mitral valve is normal in structure. Trivial mitral valve regurgitation. No evidence of mitral stenosis.  5. The aortic valve is tricuspid. Aortic valve regurgitation is moderate. No aortic stenosis is present.  6. The inferior vena cava is dilated in size with >50% respiratory variability, suggesting right atrial pressure of 8 mmHg. FINDINGS  Left Ventricle: Left ventricular ejection fraction, by estimation, is 45 to 50%. The left ventricle has mildly decreased function. The left ventricle demonstrates regional wall motion abnormalities. Definity contrast agent was given IV to delineate the left ventricular endocardial borders. The left ventricular internal cavity size was normal in size. There is no left ventricular hypertrophy. Left ventricular diastolic parameters are consistent with Grade I diastolic dysfunction (impaired relaxation). Right Ventricle: The right ventricular size is normal.Right ventricular systolic function is normal. Left Atrium: Left atrial size was normal in size. Right Atrium: Right atrial size was normal in size. Pericardium: There is no evidence of pericardial effusion. Mitral Valve: The mitral valve is normal in structure. Normal mobility of the mitral valve leaflets. Trivial mitral valve regurgitation. No evidence of mitral valve stenosis. Tricuspid Valve: The tricuspid valve is normal in structure. Tricuspid valve regurgitation is trivial. No evidence of tricuspid stenosis. Aortic Valve: The aortic valve is tricuspid. Aortic valve regurgitation is moderate. Aortic regurgitation PHT measures 402 msec. No aortic stenosis is present. Pulmonic Valve: The pulmonic valve was normal in structure. Pulmonic valve regurgitation is not visualized. No evidence of pulmonic stenosis. Aorta: The aortic root is normal in size and structure. Venous: The inferior vena cava is dilated in size with  greater than 50% respiratory variability, suggesting right atrial pressure of 8 mmHg. IAS/Shunts: No atrial level shunt detected by color flow Doppler. Additional Comments: Definity used; inferolateral and apical hypokinesis with overall mild LV dysfunction; grade 1 diastolic dysfunction; moderate AI.  LEFT VENTRICLE PLAX 2D LVIDd:         4.30 cm  Diastology LVIDs:         3.40 cm  LV e' lateral:   8.59 cm/s LV PW:         0.80 cm  LV E/e' lateral: 5.6 LV IVS:        0.70 cm  LV e' medial:    8.16 cm/s LVOT diam:     2.00 cm  LV E/e' medial:  5.9 LV SV:         52 LV SV  Index:   33 LVOT Area:     3.14 cm  RIGHT VENTRICLE RV S prime:     9.57 cm/s TAPSE (M-mode): 1.6 cm LEFT ATRIUM             Index       RIGHT ATRIUM          Index LA diam:        3.00 cm 1.87 cm/m  RA Area:     9.96 cm LA Vol (A2C):   31.3 ml 19.50 ml/m RA Volume:   21.40 ml 13.33 ml/m LA Vol (A4C):   28.2 ml 17.57 ml/m LA Biplane Vol: 31.0 ml 19.31 ml/m  AORTIC VALVE LVOT Vmax:   84.20 cm/s LVOT Vmean:  55.600 cm/s LVOT VTI:    0.167 m AI PHT:      402 msec  AORTA Ao Root diam: 3.40 cm MITRAL VALVE MV Area (PHT): 3.72 cm    SHUNTS MV Decel Time: 204 msec    Systemic VTI:  0.17 m MV E velocity: 48.00 cm/s  Systemic Diam: 2.00 cm MV A velocity: 47.60 cm/s MV E/A ratio:  1.01 Olga Millers MD Electronically signed by Olga Millers MD Signature Date/Time: 11/25/2019/12:55:45 PM    Final     Microbiology: Recent Results (from the past 240 hour(s))  Respiratory Panel by RT PCR (Flu A&B, Covid) - Nasopharyngeal Swab     Status: None   Collection Time: 11/23/19 12:18 PM   Specimen: Nasopharyngeal Swab  Result Value Ref Range Status   SARS Coronavirus 2 by RT PCR NEGATIVE NEGATIVE Final    Comment: (NOTE) SARS-CoV-2 target nucleic acids are NOT DETECTED. The SARS-CoV-2 RNA is generally detectable in upper respiratoy specimens during the acute phase of infection. The lowest concentration of SARS-CoV-2 viral copies this assay can  detect is 131 copies/mL. A negative result does not preclude SARS-Cov-2 infection and should not be used as the sole basis for treatment or other Madison Williams management decisions. A negative result may occur with  improper specimen collection/handling, submission of specimen other than nasopharyngeal swab, presence of viral mutation(s) within the areas targeted by this assay, and inadequate number of viral copies (<131 copies/mL). A negative result must be combined with clinical observations, Madison Williams history, and epidemiological information. The expected result is Negative. Fact Sheet for Patients:  https://www.moore.com/ Fact Sheet for Healthcare Providers:  https://www.young.biz/ This test is not yet ap proved or cleared by the [place] FDA and  has been authorized for detection and/or diagnosis of SARS-CoV-2 by FDA under an Emergency Use Authorization (EUA). This EUA will remain  in effect (meaning this test can be used) for the duration of the COVID-19 declaration under Section 564(b)(1) of the Act, 21 U.S.C. section 360bbb-3(b)(1), unless the authorization is terminated or revoked sooner.    Influenza A by PCR NEGATIVE NEGATIVE Final   Influenza B by PCR NEGATIVE NEGATIVE Final    Comment: (NOTE) The Xpert Xpress SARS-CoV-2/FLU/RSV assay is intended as an aid in  the diagnosis of influenza from Nasopharyngeal swab specimens and  should not be used as a sole basis for treatment. Nasal washings and  aspirates are unacceptable for Xpert Xpress SARS-CoV-2/FLU/RSV  testing. Fact Sheet for Patients: https://www.moore.com/ Fact Sheet for Healthcare Providers: https://www.young.biz/ This test is not yet approved or cleared by the [place] FDA and  has been authorized for detection and/or diagnosis of SARS-CoV-2 by  FDA under an Emergency Use Authorization (EUA). This EUA will remain  in effect (meaning  this test can be used) for the duration of the  Covid-19 declaration under Section 564(b)(1) of the Act, 21  U.S.C. section 360bbb-3(b)(1), unless the authorization is  terminated or revoked. Performed at [facility], [address], [place], [place] [address]   Culture, blood (routine x 2)     Status: None   Collection Time: 11/23/19  5:56 PM   Specimen: BLOOD LEFT HAND  Result Value Ref Range Status   Specimen Description   Final    BLOOD LEFT HAND Performed at [facility], [address], [place], [place] [address]    Special Requests   Final    BOTTLES DRAWN AEROBIC ONLY Blood Culture adequate volume Performed at [facility], [address], [place], [place] [address]    Culture   Final    NO GROWTH 5 DAYS Performed at [facility], [address], [place], [place] [address]    Report Status 11/28/2019 FINAL  Final  Culture, blood (routine x 2)     Status: None   Collection Time: 11/23/19  5:56 PM   Specimen: BLOOD LEFT HAND  Result Value Ref Range Status   Specimen Description   Final    BLOOD LEFT HAND Performed at [facility], [address], [place], [place] [address]    Special Requests   Final    BOTTLES DRAWN AEROBIC ONLY Blood Culture results may not be optimal due to an inadequate volume of blood received in culture bottles Performed at [facility], [address] [place], [place] [address]    Culture   Final    NO GROWTH 5 DAYS Performed at [facility], [address] [place], [place] [address]    Report Status 11/28/2019 FINAL  Final  MRSA PCR Screening     Status: None   Collection Time: 11/23/19  7:50 PM   Specimen: Nasal Mucosa; Nasopharyngeal  Result Value Ref Range Status   MRSA by PCR NEGATIVE NEGATIVE Final    Comment:        The GeneXpert MRSA Assay (FDA approved for NASAL specimens only), is one component of a comprehensive MRSA  colonization surveillance program. It is not intended to diagnose MRSA infection nor to guide or monitor treatment for MRSA infections. Performed at [facility], [address], [place], [place] [address]      Labs: Basic Metabolic Panel: Recent Labs  Lab 11/23/19 2118 11/24/19 0525 11/25/19 0541 11/25/19 1416 11/25/19 1805 11/26/19 0749 11/27/19 0222 11/28/19 0251 11/29/19 0712  NA 142   < >   < >  --  143 136 137 135 136  K 3.5   < >   < >  --  3.3* 3.7 4.2 3.8 4.1  CL 113*   < >   < >  --  110 104 104 102 102  CO2 13*   < >   < >  --  22 21* 21* 25 25  GLUCOSE 506*   < >   < >  --  126* 340* 265* 222* 243*  BUN 35*   < >   < >  --  CREATININE 1.94*   < >   < >  --  0.76 0.82 0.72 0.60 0.66  CALCIUM 8.5*   < >   < >  --  8.6* 8.3* 8.6* 8.7* 8.5*  MG 2.3  --   --  1.7  --  2.1 2.1  --   --   PHOS 2.1*  --   --   --   --   --   --   --   --    < > = values in this interval not displayed.   Liver Function Tests: Recent Labs  Lab 11/23/19 2118  AST 34  ALT 44  ALKPHOS 95  BILITOT 1.7*  PROT 6.1*  ALBUMIN 3.4*   Recent Labs  Lab 11/23/19 2118  LIPASE 26  AMYLASE 90   No results for input(s): AMMONIA in the last 168 hours. CBC: Recent Labs  Lab 11/24/19 0525 11/25/19 0134 11/26/19 0749 11/26/19 1228 11/27/19 0222 11/28/19 0251 11/29/19 0712  WBC 8.8   < > 2.7* 2.7* 3.2* 3.6* 4.7  NEUTROABS 6.5  --   --   --  1.3*  --   --   HGB 13.0   < > 10.2* 11.4* 11.4* 10.3* 9.5*  HCT 38.2   < > 31.7* 34.0* 33.9* 30.4* 28.9*  MCV 97.2   < > 97.8 98.0 97.1 97.1 98.6  PLT 147*   < > 92* 105* 106* 132* 217   < > = values in this interval not displayed.   Cardiac Enzymes: No results for input(s): CKTOTAL, CKMB, CKMBINDEX, TROPONINI in the last 168 hours. BNP: BNP (last 3 results) No results for input(s): BNP in the last 8760 hours.  ProBNP (last 3 results) No results for input(s): PROBNP in the last 8760 hours.  CBG: Recent  Labs  Lab 11/28/19 0647 11/28/19 1115 11/28/19 1648 11/28/19 2212 11/29/19 0645  GLUCAP 199* 218* 170* 257* 235*       Signed:  Osama Coleson  [facility] 11/29/2019, 9:22 AM

## 2019-12-01 NOTE — Telephone Encounter (Signed)
See note

## 2019-12-03 ENCOUNTER — Ambulatory Visit: Payer: Self-pay

## 2020-01-14 ENCOUNTER — Encounter (HOSPITAL_COMMUNITY): Payer: Self-pay

## 2020-01-14 ENCOUNTER — Emergency Department (HOSPITAL_COMMUNITY): Payer: Medicaid Other

## 2020-01-14 ENCOUNTER — Inpatient Hospital Stay (HOSPITAL_COMMUNITY)
Admission: EM | Admit: 2020-01-14 | Discharge: 2020-01-18 | DRG: 638 | Disposition: A | Discharge status: Home / Self Care | Payer: Medicaid Other | Attending: Internal Medicine | Admitting: Internal Medicine

## 2020-01-14 DIAGNOSIS — E1010 Type 1 diabetes mellitus with ketoacidosis without coma: Principal | ICD-10-CM

## 2020-01-14 DIAGNOSIS — E876 Hypokalemia: Secondary | ICD-10-CM | POA: Diagnosis present

## 2020-01-14 DIAGNOSIS — R112 Nausea with vomiting, unspecified: Secondary | ICD-10-CM | POA: Diagnosis present

## 2020-01-14 DIAGNOSIS — I110 Hypertensive heart disease with heart failure: Secondary | ICD-10-CM | POA: Diagnosis present

## 2020-01-14 DIAGNOSIS — F419 Anxiety disorder, unspecified: Secondary | ICD-10-CM | POA: Diagnosis present

## 2020-01-14 DIAGNOSIS — E1110 Type 2 diabetes mellitus with ketoacidosis without coma: Secondary | ICD-10-CM | POA: Diagnosis present

## 2020-01-14 DIAGNOSIS — Z7982 Long term (current) use of aspirin: Secondary | ICD-10-CM | POA: Diagnosis not present

## 2020-01-14 DIAGNOSIS — I2510 Atherosclerotic heart disease of native coronary artery without angina pectoris: Secondary | ICD-10-CM | POA: Diagnosis present

## 2020-01-14 DIAGNOSIS — R319 Hematuria, unspecified: Secondary | ICD-10-CM | POA: Diagnosis present

## 2020-01-14 DIAGNOSIS — F17200 Nicotine dependence, unspecified, uncomplicated: Secondary | ICD-10-CM | POA: Diagnosis present

## 2020-01-14 DIAGNOSIS — R7989 Other specified abnormal findings of blood chemistry: Secondary | ICD-10-CM | POA: Diagnosis present

## 2020-01-14 DIAGNOSIS — R300 Dysuria: Secondary | ICD-10-CM | POA: Diagnosis present

## 2020-01-14 DIAGNOSIS — N179 Acute kidney failure, unspecified: Secondary | ICD-10-CM | POA: Diagnosis present

## 2020-01-14 DIAGNOSIS — F19239 Other psychoactive substance dependence with withdrawal, unspecified: Secondary | ICD-10-CM | POA: Diagnosis present

## 2020-01-14 DIAGNOSIS — Z20822 Contact with and (suspected) exposure to covid-19: Secondary | ICD-10-CM | POA: Diagnosis present

## 2020-01-14 DIAGNOSIS — Z87442 Personal history of urinary calculi: Secondary | ICD-10-CM

## 2020-01-14 DIAGNOSIS — Z9114 Patient's other noncompliance with medication regimen: Secondary | ICD-10-CM | POA: Diagnosis not present

## 2020-01-14 DIAGNOSIS — E860 Dehydration: Secondary | ICD-10-CM | POA: Diagnosis present

## 2020-01-14 DIAGNOSIS — F19230 Other psychoactive substance dependence with withdrawal, uncomplicated: Secondary | ICD-10-CM

## 2020-01-14 DIAGNOSIS — Z9119 Patient's noncompliance with other medical treatment and regimen: Secondary | ICD-10-CM

## 2020-01-14 DIAGNOSIS — J4520 Mild intermittent asthma, uncomplicated: Secondary | ICD-10-CM | POA: Diagnosis present

## 2020-01-14 DIAGNOSIS — I5022 Chronic systolic (congestive) heart failure: Secondary | ICD-10-CM | POA: Diagnosis present

## 2020-01-14 DIAGNOSIS — Z79899 Other long term (current) drug therapy: Secondary | ICD-10-CM

## 2020-01-14 DIAGNOSIS — F19939 Other psychoactive substance use, unspecified with withdrawal, unspecified: Secondary | ICD-10-CM | POA: Diagnosis present

## 2020-01-14 DIAGNOSIS — M069 Rheumatoid arthritis, unspecified: Secondary | ICD-10-CM | POA: Diagnosis present

## 2020-01-14 DIAGNOSIS — K219 Gastro-esophageal reflux disease without esophagitis: Secondary | ICD-10-CM | POA: Diagnosis present

## 2020-01-14 DIAGNOSIS — Z794 Long term (current) use of insulin: Secondary | ICD-10-CM | POA: Diagnosis not present

## 2020-01-14 DIAGNOSIS — R1084 Generalized abdominal pain: Secondary | ICD-10-CM

## 2020-01-14 LAB — URINALYSIS, ROUTINE W REFLEX MICROSCOPIC
Bacteria, UA: NONE SEEN
Bilirubin Urine: NEGATIVE
Glucose, UA: >=500 mg/dL — AB
Ketones, ur: 80 mg/dL — AB
Leukocytes,Ua: NEGATIVE
Nitrite: NEGATIVE
Protein, ur: 100 mg/dL — AB
Specific Gravity, Urine: 1.023 (ref 1.005–1.030)
pH: 5 (ref 5.0–8.0)

## 2020-01-14 LAB — BASIC METABOLIC PANEL
Anion gap: 25 — ABNORMAL HIGH (ref 5–15)
Anion gap: 23 — ABNORMAL HIGH (ref 5–15)
Anion gap: 16 — ABNORMAL HIGH (ref 5–15)
Anion gap: 11 (ref 5–15)
Anion gap: 14 (ref 5–15)
BUN: 21 mg/dL — ABNORMAL HIGH (ref 6–20)
BUN: 18 mg/dL (ref 6–20)
BUN: 16 mg/dL (ref 6–20)
BUN: 17 mg/dL (ref 6–20)
BUN: 16 mg/dL (ref 6–20)
CO2: 9 mmol/L — ABNORMAL LOW (ref 22–32)
CO2: 7 mmol/L — ABNORMAL LOW (ref 22–32)
CO2: 13 mmol/L — ABNORMAL LOW (ref 22–32)
CO2: 17 mmol/L — ABNORMAL LOW (ref 22–32)
CO2: 16 mmol/L — ABNORMAL LOW (ref 22–32)
Calcium: 8.6 mg/dL — ABNORMAL LOW (ref 8.9–10.3)
Calcium: 8 mg/dL — ABNORMAL LOW (ref 8.9–10.3)
Calcium: 8.6 mg/dL — ABNORMAL LOW (ref 8.9–10.3)
Calcium: 8.8 mg/dL — ABNORMAL LOW (ref 8.9–10.3)
Calcium: 9 mg/dL (ref 8.9–10.3)
Chloride: 103 mmol/L (ref 98–111)
Chloride: 108 mmol/L (ref 98–111)
Chloride: 111 mmol/L (ref 98–111)
Chloride: 111 mmol/L (ref 98–111)
Chloride: 110 mmol/L (ref 98–111)
Creatinine, Ser: 1.24 mg/dL — ABNORMAL HIGH (ref 0.44–1.00)
Creatinine, Ser: 1.08 mg/dL — ABNORMAL HIGH (ref 0.44–1.00)
Creatinine, Ser: 0.92 mg/dL (ref 0.44–1.00)
Creatinine, Ser: 0.75 mg/dL (ref 0.44–1.00)
Creatinine, Ser: 0.7 mg/dL (ref 0.44–1.00)
GFR calc Af Amer: >60 mL/min (ref >60)
GFR calc Af Amer: >60 mL/min (ref >60)
GFR calc Af Amer: >60 mL/min (ref >60)
GFR calc Af Amer: >60 mL/min (ref >60)
GFR calc Af Amer: >60 mL/min (ref >60)
GFR calc non Af Amer: 57 mL/min — ABNORMAL LOW (ref >60)
GFR calc non Af Amer: >60 mL/min (ref >60)
GFR calc non Af Amer: >60 mL/min (ref >60)
GFR calc non Af Amer: >60 mL/min (ref >60)
GFR calc non Af Amer: >60 mL/min (ref >60)
Glucose, Bld: 405 mg/dL — ABNORMAL HIGH (ref 70–99)
Glucose, Bld: 337 mg/dL — ABNORMAL HIGH (ref 70–99)
Glucose, Bld: 242 mg/dL — ABNORMAL HIGH (ref 70–99)
Glucose, Bld: 156 mg/dL — ABNORMAL HIGH (ref 70–99)
Glucose, Bld: 133 mg/dL — ABNORMAL HIGH (ref 70–99)
Potassium: 5 mmol/L (ref 3.5–5.1)
Potassium: 4.5 mmol/L (ref 3.5–5.1)
Potassium: 3.4 mmol/L — ABNORMAL LOW (ref 3.5–5.1)
Potassium: 3.5 mmol/L (ref 3.5–5.1)
Potassium: 3.6 mmol/L (ref 3.5–5.1)
Sodium: 137 mmol/L (ref 135–145)
Sodium: 138 mmol/L (ref 135–145)
Sodium: 140 mmol/L (ref 135–145)
Sodium: 139 mmol/L (ref 135–145)
Sodium: 140 mmol/L (ref 135–145)

## 2020-01-14 LAB — HEPATIC FUNCTION PANEL
ALT: 34 U/L (ref 0–44)
AST: 21 U/L (ref 15–41)
Albumin: 4.9 g/dL (ref 3.5–5.0)
Alkaline Phosphatase: 114 U/L (ref 38–126)
Bilirubin, Direct: 0.1 mg/dL (ref 0.0–0.2)
Indirect Bilirubin: 1.9 mg/dL — ABNORMAL HIGH (ref 0.3–0.9)
Total Bilirubin: 2 mg/dL — ABNORMAL HIGH (ref 0.3–1.2)
Total Protein: 9.1 g/dL — ABNORMAL HIGH (ref 6.5–8.1)

## 2020-01-14 LAB — CBC
HCT: 48.4 % — ABNORMAL HIGH (ref 36.0–46.0)
Hemoglobin: 15.4 g/dL — ABNORMAL HIGH (ref 12.0–15.0)
MCH: 32.9 pg (ref 26.0–34.0)
MCHC: 31.8 g/dL (ref 30.0–36.0)
MCV: 103.4 fL — ABNORMAL HIGH (ref 80.0–100.0)
Platelets: 281 10*3/uL (ref 150–400)
RBC: 4.68 MIL/uL (ref 3.87–5.11)
RDW: 14.3 % (ref 11.5–15.5)
WBC: 13.6 10*3/uL — ABNORMAL HIGH (ref 4.0–10.5)
nRBC: 0 % (ref 0.0–0.2)

## 2020-01-14 LAB — GLUCOSE, CAPILLARY
Glucose-Capillary: 271 mg/dL — ABNORMAL HIGH (ref 70–99)
Glucose-Capillary: 246 mg/dL — ABNORMAL HIGH (ref 70–99)
Glucose-Capillary: 216 mg/dL — ABNORMAL HIGH (ref 70–99)
Glucose-Capillary: 163 mg/dL — ABNORMAL HIGH (ref 70–99)
Glucose-Capillary: 180 mg/dL — ABNORMAL HIGH (ref 70–99)
Glucose-Capillary: 158 mg/dL — ABNORMAL HIGH (ref 70–99)
Glucose-Capillary: 147 mg/dL — ABNORMAL HIGH (ref 70–99)
Glucose-Capillary: 117 mg/dL — ABNORMAL HIGH (ref 70–99)
Glucose-Capillary: 132 mg/dL — ABNORMAL HIGH (ref 70–99)
Glucose-Capillary: 140 mg/dL — ABNORMAL HIGH (ref 70–99)

## 2020-01-14 LAB — BLOOD GAS, VENOUS
Acid-base deficit: 21.1 mmol/L — ABNORMAL HIGH (ref 0.0–2.0)
Bicarbonate: 7.7 mmol/L — ABNORMAL LOW (ref 20.0–28.0)
FIO2: 21
O2 Saturation: 52.1 %
Patient temperature: 98.6
pCO2, Ven: 25 mmHg — ABNORMAL LOW (ref 44.0–60.0)
pH, Ven: 7.117 — CL (ref 7.250–7.430)
pO2, Ven: 37.1 mmHg (ref 32.0–45.0)

## 2020-01-14 LAB — BETA-HYDROXYBUTYRIC ACID: Beta-Hydroxybutyric Acid: >8 mmol/L — ABNORMAL HIGH (ref 0.05–0.27)

## 2020-01-14 LAB — MRSA PCR SCREENING: MRSA by PCR: NEGATIVE

## 2020-01-14 LAB — CBG MONITORING, ED
Glucose-Capillary: 409 mg/dL — ABNORMAL HIGH (ref 70–99)
Glucose-Capillary: 215 mg/dL — ABNORMAL HIGH (ref 70–99)

## 2020-01-14 LAB — SARS CORONAVIRUS 2 BY RT PCR (HOSPITAL ORDER, PERFORMED IN ~~LOC~~ HOSPITAL LAB): SARS Coronavirus 2: NEGATIVE

## 2020-01-14 LAB — LIPASE, BLOOD: Lipase: 19 U/L (ref 11–51)

## 2020-01-14 LAB — PREGNANCY, URINE: Preg Test, Ur: NEGATIVE

## 2020-01-14 LAB — MAGNESIUM: Magnesium: 2.2 mg/dL (ref 1.7–2.4)

## 2020-01-14 MED ORDER — PANTOPRAZOLE SODIUM 40 MG PO TBEC
40.0000 mg | DELAYED_RELEASE_TABLET | Freq: Every day | ORAL | Status: DC
  Administered 2020-01-14 – 2020-01-15 (×2): 40 mg via ORAL
  Filled 2020-01-14 (×2): qty 1

## 2020-01-14 MED ORDER — DEXTROSE 50 % IV SOLN: 0.0000 mL | INTRAVENOUS | Status: DC | PRN

## 2020-01-14 MED ORDER — POTASSIUM CHLORIDE 10 MEQ/100ML IV SOLN
10.0000 meq | INTRAVENOUS | Status: AC
  Administered 2020-01-14: 10 meq via INTRAVENOUS
  Filled 2020-01-14: qty 100

## 2020-01-14 MED ORDER — NICOTINE 14 MG/24HR TD PT24
14.0000 mg | MEDICATED_PATCH | Freq: Every day | TRANSDERMAL | Status: DC
  Administered 2020-01-14 – 2020-01-17 (×4): 14 mg via TRANSDERMAL
  Filled 2020-01-14 (×5): qty 1

## 2020-01-14 MED ORDER — ALBUTEROL SULFATE (2.5 MG/3ML) 0.083% IN NEBU: 2.5000 mg | INHALATION_SOLUTION | Freq: Four times a day (QID) | RESPIRATORY_TRACT | Status: DC | PRN

## 2020-01-14 MED ORDER — CHLORHEXIDINE GLUCONATE CLOTH 2 % EX PADS
6.0000 | MEDICATED_PAD | Freq: Every day | CUTANEOUS | Status: DC
  Administered 2020-01-14 – 2020-01-18 (×4): 6 via TOPICAL

## 2020-01-14 MED ORDER — SODIUM CHLORIDE 0.9 % IV BOLUS
1000.0000 mL | Freq: Once | INTRAVENOUS | Status: AC
  Administered 2020-01-14: 1000 mL via INTRAVENOUS

## 2020-01-14 MED ORDER — SODIUM CHLORIDE 0.9% FLUSH
10.0000 mL | Freq: Two times a day (BID) | INTRAVENOUS | Status: DC
  Administered 2020-01-14 – 2020-01-16 (×5): 10 mL

## 2020-01-14 MED ORDER — PROMETHAZINE HCL 25 MG/ML IJ SOLN
12.5000 mg | Freq: Once | INTRAMUSCULAR | Status: AC
  Administered 2020-01-14: 12.5 mg via INTRAVENOUS
  Filled 2020-01-14: qty 1

## 2020-01-14 MED ORDER — AMLODIPINE BESYLATE 5 MG PO TABS
2.5000 mg | ORAL_TABLET | Freq: Every day | ORAL | Status: DC
  Administered 2020-01-15 – 2020-01-18 (×4): 2.5 mg via ORAL
  Filled 2020-01-14 (×4): qty 1

## 2020-01-14 MED ORDER — INSULIN REGULAR(HUMAN) IN NACL 100-0.9 UT/100ML-% IV SOLN
INTRAVENOUS | Status: DC
  Filled 2020-01-14: qty 100

## 2020-01-14 MED ORDER — SODIUM CHLORIDE 0.9% FLUSH: 10.0000 mL | INTRAVENOUS | Status: DC | PRN

## 2020-01-14 MED ORDER — ONDANSETRON HCL 4 MG/2ML IJ SOLN
4.0000 mg | Freq: Four times a day (QID) | INTRAMUSCULAR | Status: DC | PRN
  Administered 2020-01-14 (×2): 4 mg via INTRAVENOUS
  Filled 2020-01-14: qty 2

## 2020-01-14 MED ORDER — TRAMADOL HCL 50 MG PO TABS
50.0000 mg | ORAL_TABLET | Freq: Four times a day (QID) | ORAL | Status: DC | PRN
  Administered 2020-01-14 – 2020-01-16 (×8): 50 mg via ORAL
  Filled 2020-01-14 (×8): qty 1

## 2020-01-14 MED ORDER — POLYETHYLENE GLYCOL 3350 17 G PO PACK: 17.0000 g | PACK | Freq: Every day | ORAL | Status: DC | PRN

## 2020-01-14 MED ORDER — ORAL CARE MOUTH RINSE
15.0000 mL | Freq: Two times a day (BID) | OROMUCOSAL | Status: DC
  Administered 2020-01-14 – 2020-01-16 (×5): 15 mL via OROMUCOSAL

## 2020-01-14 MED ORDER — PROMETHAZINE HCL 25 MG/ML IJ SOLN
12.5000 mg | Freq: Four times a day (QID) | INTRAMUSCULAR | Status: DC | PRN
  Administered 2020-01-14 – 2020-01-16 (×6): 12.5 mg via INTRAVENOUS
  Filled 2020-01-14 (×5): qty 1

## 2020-01-14 MED ORDER — HYDROCODONE-ACETAMINOPHEN 5-325 MG PO TABS
1.0000 | ORAL_TABLET | Freq: Four times a day (QID) | ORAL | Status: DC | PRN
  Administered 2020-01-14 – 2020-01-15 (×2): 1 via ORAL
  Administered 2020-01-15: 2 via ORAL
  Administered 2020-01-15 (×3): 1 via ORAL
  Administered 2020-01-16 – 2020-01-18 (×9): 2 via ORAL
  Filled 2020-01-14 (×2): qty 2
  Filled 2020-01-14 (×2): qty 1
  Filled 2020-01-14: qty 2
  Filled 2020-01-14: qty 1
  Filled 2020-01-14 (×2): qty 2
  Filled 2020-01-14: qty 1
  Filled 2020-01-14 (×5): qty 2
  Filled 2020-01-14: qty 1

## 2020-01-14 MED ORDER — POTASSIUM CHLORIDE CRYS ER 20 MEQ PO TBCR
20.0000 meq | EXTENDED_RELEASE_TABLET | Freq: Once | ORAL | Status: AC
  Administered 2020-01-14: 20 meq via ORAL
  Filled 2020-01-14: qty 1

## 2020-01-14 MED ORDER — DEXTROSE-NACL 5-0.45 % IV SOLN: INTRAVENOUS | Status: DC

## 2020-01-14 MED ORDER — BISMUTH SUBSALICYLATE 262 MG/15ML PO SUSP
30.0000 mL | Freq: Four times a day (QID) | ORAL | Status: DC | PRN
  Administered 2020-01-15 – 2020-01-16 (×3): 30 mL via ORAL
  Filled 2020-01-14 (×2): qty 236

## 2020-01-14 MED ORDER — SODIUM CHLORIDE 0.9 % IV SOLN: INTRAVENOUS | Status: DC

## 2020-01-14 MED ORDER — INSULIN REGULAR(HUMAN) IN NACL 100-0.9 UT/100ML-% IV SOLN
INTRAVENOUS | Status: DC
  Administered 2020-01-14: 3.2 [IU]/h via INTRAVENOUS

## 2020-01-14 MED ORDER — LABETALOL HCL 5 MG/ML IV SOLN
20.0000 mg | INTRAVENOUS | Status: DC | PRN
  Administered 2020-01-14 – 2020-01-15 (×2): 20 mg via INTRAVENOUS
  Filled 2020-01-14 (×3): qty 4

## 2020-01-14 MED ORDER — ASPIRIN EC 81 MG PO TBEC
81.0000 mg | DELAYED_RELEASE_TABLET | Freq: Every day | ORAL | Status: DC
  Administered 2020-01-15 – 2020-01-18 (×4): 81 mg via ORAL
  Filled 2020-01-14 (×4): qty 1

## 2020-01-14 MED ORDER — ENOXAPARIN SODIUM 40 MG/0.4ML ~~LOC~~ SOLN
40.0000 mg | SUBCUTANEOUS | Status: DC
  Administered 2020-01-14 – 2020-01-18 (×5): 40 mg via SUBCUTANEOUS
  Filled 2020-01-14 (×5): qty 0.4

## 2020-01-14 MED ORDER — ACETAMINOPHEN 325 MG PO TABS: 650.0000 mg | ORAL_TABLET | Freq: Four times a day (QID) | ORAL | Status: DC | PRN

## 2020-01-14 MED ORDER — CARVEDILOL 3.125 MG PO TABS
3.1250 mg | ORAL_TABLET | Freq: Two times a day (BID) | ORAL | Status: DC
  Administered 2020-01-14 – 2020-01-18 (×8): 3.125 mg via ORAL
  Filled 2020-01-14 (×8): qty 1

## 2020-01-14 MED ORDER — LIDOCAINE VISCOUS HCL 2 % MT SOLN
15.0000 mL | Freq: Once | OROMUCOSAL | Status: AC
  Administered 2020-01-14: 15 mL via ORAL
  Filled 2020-01-14: qty 15

## 2020-01-14 MED ORDER — LIP MEDEX EX OINT
TOPICAL_OINTMENT | CUTANEOUS | Status: DC | PRN
  Filled 2020-01-14: qty 7

## 2020-01-14 MED ORDER — ALUM & MAG HYDROXIDE-SIMETH 200-200-20 MG/5ML PO SUSP
30.0000 mL | Freq: Once | ORAL | Status: AC
  Administered 2020-01-14: 30 mL via ORAL
  Filled 2020-01-14: qty 30

## 2020-01-14 NOTE — H&P (Addendum)
History and Physical    Madison Williams VPX:[id number] DOB: 01-12-1986 DOA: 01/14/2020  PCP: Patient, No Pcp Per  Patient coming from: home  I have personally briefly reviewed patient's old medical records in [place]  Chief Complaint: right kidney pain  HPI: Madison Williams is a 34 y.o. female with medical history significant of poorly controlled type 1 diabetes with recurrent episodes of DKA, mild intermittent asthma, chronic tobacco abuse, hypertension presented to [facility] ED today complaining of right kidney pain, nausea and vomiting and difficulty controlling her blood sugars despite taking extra insulin.  Patient reports nausea and several episodes of nonbloody nonbilious vomiting over the past 18 hours.  She denies any known trigger but says she has stomach problems and occasional bouts of vomiting similar to this.  Does not think she ate or drank anything to cause it.  She reports associated dysuria and hematuria and subjective chills but no known fevers.  No known sick contacts, no travel.  Due to vomiting, patient has been unable to take right arm which she uses daily for her pain and anxiety.  She states that she is going through withdrawal with chills, feeling shaky, nasal congestion and sore all over.  Of note, patient does not follow with a PCP.  States her insulin is prescribed here, she has difficulty affording it.  ED Course: Initial vitals temp 98.1, heart rate 105, respirations 28, BP 130/81, O2 sat 100% on room air.  VBG showed pH of 7.117.  CMP notable for glucose of 405, bicarb of 9, anion gap 25, elevated total and indirect bilirubin with normal transaminases.  CBC was notable for leukocytosis 13.6, and hemoconcentration with Hbg15.4 and Hct 48.4.  Urinalysis showed ketones and protein, but negative for infection.  Urine was sent for culture given her symptoms.  CT abdomen pelvis renal stone protocol was negative for any acute intra-abdominal findings  including kidney stone.  She was treated with IV fluid resuscitation and started on insulin drip in the ED.  Admitted to stepdown on hospitalist service for further management of DKA.  Review of Systems: As per HPI otherwise 10 point review of systems negative.    Past Medical History:  Diagnosis Date  . Asthma   . Diabetes mellitus without complication (Madison Williams)   . Rheumatoid arthritis [facility])     Past Surgical History:  Procedure Laterality Date  . LAPAROSCOPIC OVARIAN    . LEFT HEART CATH AND CORONARY ANGIOGRAPHY N/A 11/24/2019   Procedure: LEFT HEART CATH AND CORONARY ANGIOGRAPHY;  Surgeon: Burnell Blanks, MD;  Location: [facility];  Service: Cardiovascular;  Laterality: N/A;     reports that she has been smoking. She has been smoking about 1.00 pack per day. She has never used smokeless tobacco. She reports current alcohol use. She reports that she does not use drugs.  No Known Allergies  Family history reviewed.  Patient denies any pertinent family history.    Prior to Admission medications   Medication Sig Start Date End Date Taking? Authorizing Provider  albuterol (PROVENTIL HFA;VENTOLIN HFA) 108 (90 Base) MCG/ACT inhaler Inhale 2 puffs into the lungs every 6 (six) hours as needed for wheezing or shortness of breath.   Yes [provider]  amLODipine (NORVASC) 2.5 MG tablet Take 1 tablet (2.5 mg total) by mouth daily. 11/29/19  Yes Mikhail, Velta Addison, DO  aspirin EC 81 MG EC tablet Take 1 tablet (81 mg total) by mouth daily. 11/29/19  Yes Mikhail, Velta Addison, DO  bismuth subsalicylate (PEPTO BISMOL) 262 MG/15ML suspension Take 30 mLs by mouth every 6 (six) hours as needed for indigestion or diarrhea or loose stools.   Yes [provider]  carvedilol (COREG) 3.125 MG tablet Take 1 tablet (3.125 mg total) by mouth 2 (two) times daily with a meal. 11/29/19  Yes Mikhail, Maryann, DO  insulin aspart (NOVOLOG) 100 UNIT/ML injection Inject 4 Units into the skin 3  (three) times daily with meals. 11/29/19  Yes Mikhail, Maryann, DO  insulin glargine (LANTUS) 100 UNIT/ML injection Inject 0.34 mLs (34 Units total) into the skin daily. 11/29/19  Yes Mikhail, Pensions consultant, DO  OVER THE COUNTER MEDICATION Take 10 g by mouth daily. Kratom (Used for withdrawal/cease cravings)   Yes [provider]  pantoprazole (PROTONIX) 40 MG tablet Take 1 tablet (40 mg total) by mouth at bedtime. 11/29/19  Yes Mikhail, Nita Sells, DO  polyethylene glycol (MIRALAX / GLYCOLAX) 17 g packet Take 17 g by mouth daily as needed for moderate constipation. 11/29/19  Yes Mikhail, Nita Sells, DO  promethazine (PHENERGAN) 12.5 MG tablet Take 1 tablet (12.5 mg total) by mouth every 6 (six) hours as needed for nausea or vomiting. 11/29/19  Yes Mikhail, Maryann, DO  nicotine (NICODERM CQ - DOSED IN MG/24 HOURS) 14 mg/24hr patch Place 1 patch (14 mg total) onto the skin daily. Patient not taking: Reported on 01/14/2020 11/29/19   Edsel Petrin, DO    Physical Exam: Vitals:   01/14/20 0848 01/14/20 0918 01/14/20 0922 01/14/20 1000  BP: 140/87  (!) 143/78 131/89  Pulse: (!) 106 (!) 108 (!) 108 (!) 101  Resp: (!) 31 (!) 33 (!) 25 (!) 33  Temp:      TempSrc:      SpO2: 100% 100% 100% 100%  Weight:      Height:         Constitutional: NAD, calm, ill-appearing Eyes: EOMI, lids and conjunctivae normal ENMT: Mucous membranes are dry.  Very dry cracking lips.  Hearing grossly normal.  Poor dentition. Respiratory: CTAB, no wheezing, no crackles. Normal respiratory effort. No accessory muscle use.  Cardiovascular: RRR, no murmurs / rubs / gallops. No extremity edema. 2+ pedal pulses. Abdomen: soft, nondistended, diffusely tender on light palpation, no masses or HSM palpated. +Bowel sounds.  Musculoskeletal: no clubbing / cyanosis. No joint deformity upper and lower extremities. Normal muscle tone.  Skin: dry, intact, normal color, normal temperature Neurologic: CN 2-12 grossly intact. Normal speech.   Grossly non-focal exam. Psychiatric: Alert and oriented x 3. Normal mood. Congruent affect.     Labs on Admission: I have personally reviewed following labs and imaging studies  CBC: Recent Labs  Lab 01/14/20 0738  WBC 13.6*  HGB 15.4*  HCT 48.4*  MCV 103.4*  PLT 281   Basic Metabolic Panel: Recent Labs  Lab 01/14/20 0731  NA 137  K 5.0  CL 103  CO2 9*  GLUCOSE 405*  BUN 21*  CREATININE 1.24*  CALCIUM 8.6*  MG 2.2   GFR: Estimated Creatinine Clearance: 53.2 mL/min (A) (by C-G formula based on SCr of 1.24 mg/dL (H)). Liver Function Tests: Recent Labs  Lab 01/14/20 0731  AST 21  ALT 34  ALKPHOS 114  BILITOT 2.0*  PROT 9.1*  ALBUMIN 4.9   Recent Labs  Lab 01/14/20 0731  LIPASE 19   No results for input(s): AMMONIA in the last 168 hours. Coagulation Profile: No results for input(s): INR, PROTIME in the last 168 hours. Cardiac Enzymes: No results for input(s): CKTOTAL,  CKMB, CKMBINDEX, TROPONINI in the last 168 hours. BNP (last 3 results) No results for input(s): PROBNP in the last 8760 hours. HbA1C: No results for input(s): HGBA1C in the last 72 hours. CBG: Recent Labs  Lab 01/14/20 0759  GLUCAP 409*   Lipid Profile: No results for input(s): CHOL, HDL, LDLCALC, TRIG, CHOLHDL, LDLDIRECT in the last 72 hours. Thyroid Function Tests: No results for input(s): TSH, T4TOTAL, FREET4, T3FREE, THYROIDAB in the last 72 hours. Anemia Panel: No results for input(s): VITAMINB12, FOLATE, FERRITIN, TIBC, IRON, RETICCTPCT in the last 72 hours. Urine analysis:    Component Value Date/Time   COLORURINE STRAW (A) 01/14/2020 0738   APPEARANCEUR CLEAR 01/14/2020 0738   LABSPEC 1.023 01/14/2020 0738   PHURINE 5.0 01/14/2020 0738   GLUCOSEU >=500 (A) 01/14/2020 0738   HGBUR SMALL (A) 01/14/2020 0738   BILIRUBINUR NEGATIVE 01/14/2020 0738   KETONESUR 80 (A) 01/14/2020 0738   PROTEINUR 100 (A) 01/14/2020 0738   NITRITE NEGATIVE 01/14/2020 0738   LEUKOCYTESUR  NEGATIVE 01/14/2020 0738    Radiological Exams on Admission: CT Renal Stone Study  Result Date: 01/14/2020 CLINICAL DATA:  Right flank pain for the past 2 days. History of kidney stones. EXAM: CT ABDOMEN AND PELVIS WITHOUT CONTRAST TECHNIQUE: Multidetector CT imaging of the abdomen and pelvis was performed following the standard protocol without IV contrast. COMPARISON:  CT abdomen pelvis dated November 24, 2019. FINDINGS: Lower chest: No acute abnormality. Hepatobiliary: 7 mm low-density lesion in the anterior left hepatic lobe is too small to characterize. Unchanged diffuse hepatic steatosis with more focal along the falciform ligament. The gallbladder is unremarkable. No biliary dilatation. Pancreas: Unremarkable. No pancreatic ductal dilatation or surrounding inflammatory changes. Spleen: Normal in size without focal abnormality. Adrenals/Urinary Tract: Adrenal glands are unremarkable. Kidneys are normal, without renal calculi, focal lesion, or hydronephrosis. Bladder is unremarkable. Stomach/Bowel: Stomach is within normal limits. Appendix appears normal. No evidence of bowel wall thickening, distention, or inflammatory changes. Vascular/Lymphatic: No significant vascular findings are present. No enlarged abdominal or pelvic lymph nodes. Reproductive: Uterus and bilateral adnexa are unremarkable. Other: No abdominal wall hernia or abnormality. No abdominopelvic ascites. Resolved pelvic hematoma. No pneumoperitoneum. Musculoskeletal: No acute or significant osseous findings. IMPRESSION: 1. No acute intra-abdominal process. No urolithiasis. 2. Unchanged hepatic steatosis. 3. Resolved pelvic hematoma. Electronically Signed   By: Obie Dredge M.D.   On: 01/14/2020 09:15    EKG: Independently reviewed.  Sinus tachycardia, 100 bpm, normal axis, no ST elevation.  Assessment/Plan Active Problems:   DKA (diabetic ketoacidoses) (HCC)   Diabetic ketoacidosis- present on admission with glucose 405, anion gap  25, pH 7.117.  Recurrent. --admit to stepdown --hyperglycemic crisis order set was utilized --insulin gtt per Endo-Tool --initial K 5.0, per protocol, 10 mEq x2  --continue IV hydration --NPO --monitor electrolytes, replace K to keep > 4.0 --A1c pending --TOC consulted for medication assistance --Diabetes coordinator consulted  Substance withdrawal - patient uses Kratom daily for pain and anxiety, has not been able to take due to vomiting.  Reports shakes, chills, congestion and diffuse aches due to withdrawal. --Tylenol or Tramadol PRN  Acute kidney injury -present on admission with creatinine 1.24.  Likely prerenal azotemia due to dehydration in the setting of DKA.  Expect improvement with IV hydration. --Monitor BMP  Hx of CAD - stable, no chest pain.   --Continue ASA and Coreg  GERD / Indigestion - continue home Protonix, PeptoBismol   DVT prophylaxis: Lovenox Code Status: Full Family Communication: None at bedside during  encounter, will attempt to call Disposition Plan: Expect discharge home in 24 to 48 hours Consults called: None Admission status: obs   Status is: Observation  The patient remains OBS appropriate and will d/c before 2 midnights.  Dispo: The patient is from: Home              Anticipated d/c is to: Home              Anticipated d/c date is: 1 day              Patient currently is not medically stable to d/c.      Pennie Banter, DO [facility]  01/14/2020, 10:37 AM    If 7PM-7AM, please contact night-coverage. How to contact the [facility] Attending or Consulting provider 7A - 7P or covering provider during after hours 7P -7A, for this patient?    1. Check the care team in [facility] and look for a) attending/consulting TRH provider listed and b) the [facility] team listed 2. Log into [email or web address] and use [place]'s universal password to access. If you do not have the password, please contact the hospital operator. 3. Locate the [facility] provider you are  looking for under [facility] and page to a number that you can be directly reached. 4. If you still have difficulty reaching the provider, please page the [facility] (Director on Call) for the Hospitalists listed on amion for assistance.

## 2020-01-14 NOTE — ED Notes (Signed)
Patient reports right sided flank pain x24 hour. Patient reports last time this happened she had an infected kidney stone and that is what caused her DKA

## 2020-01-14 NOTE — Progress Notes (Signed)
Inpatient Diabetes Program Recommendations  AACE/ADA: New Consensus Statement on Inpatient Glycemic Control (2015)  Target Ranges:  Prepandial:   less than 140 mg/dL      Peak postprandial:   less than 180 mg/dL (1-2 hours)      Critically ill patients:  140 - 180 mg/dL   Lab Results  Component Value Date   GLUCAP 215 (H) 01/14/2020   HGBA1C 10.8 (H) 10/17/2019    Review of Glycemic Control  Diabetes history: DM1 Outpatient Diabetes medications: Lantus 34 units QD, Novolog 4 units tidwc Current orders for Inpatient glycemic control: IV insulin per EndoTool  Last Lantus dose - 1 week ago, had taken 50 units of Novolog in past 12H since admission  Inpatient Diabetes Program Recommendations:     Need updated HgbA1C.  Pt has met with several Diabetes Coordinators in the past few months.  Will speak with pt about her diabetes control when appropriate. Has no insurance and will likely need affordable meds. Consult to TOC.  Continue to follow.  Thank you. Lorenda Peck, RD, LDN, CDE Inpatient Diabetes Coordinator [phone]

## 2020-01-14 NOTE — ED Triage Notes (Signed)
Per EMS: Patient is coming from home for DKA. Patient's CBG was 350 with EMS. Patient reports she has been doubling up on her insulin for the past 48 hours and when she was previously in DKA her sugars were in the 400's/ Patient has type one 1 diabetic.

## 2020-01-14 NOTE — ED Provider Notes (Signed)
Emergency Department Provider Note   I have reviewed the triage vital signs and the nursing notes.   HISTORY  Chief Complaint Hyperglycemia   HPI Madison Williams is a 34 y.o. female with past medical history reviewed below including insulin-dependent diabetes presents with concern for DKA by EMS.  Patient states she is been feeling badly for several days.  She is developed some lower back and right flank pain which remind her of urine infection and kidney stones in the past although her flank pain is not quite as severe as her typical kidney stone pain.  She does not have a thermometer at home and so is not sure about fever.  Her blood sugars have been running very high and despite doubling her insulin she has been unable to bring it down.  She is feeling increasingly short of breath and grew concerned that she was going into DKA so called 911.  EMS started a small IV fluid bolus and gave Zofran.  Patient describes severe nausea and notes that Phenergan typically works better for her.  She denies any chest pain with her shortness of breath.  She has diffuse abdominal discomfort which is moderate anteriorly but no focal area of pain. No radiation of symptoms or modifying factors.   Past Medical History:  Diagnosis Date  . Asthma   . Diabetes mellitus without complication ([place])   . Rheumatoid arthritis [facility])     Patient Active Problem List   Diagnosis Date Noted  . SOB (shortness of breath)   . Hypokalemia   . Macrocytosis without anemia   . Retroperitoneal hematoma   . DKA (diabetic ketoacidosis) ([place]) 11/24/2019  . Acute ST elevation myocardial infarction (STEMI) of anterolateral wall ([place])   . Abnormal EKG   . Elevated troponin   . Nausea & vomiting 10/16/2019  . Abdominal pain 10/16/2019  . Chest tightness   . Mild intermittent asthma without complication   . Acute vomiting 01/31/2019  . Type 1 diabetes mellitus with hyperglycemia ([place]) 01/31/2019  . Uninsured  01/31/2019  . DKA (diabetic ketoacidoses) ([place]) 03/30/2018  . AKI (acute kidney injury) ([place]) 03/30/2018  . Leukocytosis 03/30/2018  . Dehydration 03/30/2018  . Tobacco use disorder 03/30/2018    Past Surgical History:  Procedure Laterality Date  . LAPAROSCOPIC OVARIAN    . LEFT HEART CATH AND CORONARY ANGIOGRAPHY N/A 11/24/2019   Procedure: LEFT HEART CATH AND CORONARY ANGIOGRAPHY;  Surgeon: Burnell Blanks, MD;  Location: [facility];  Service: Cardiovascular;  Laterality: N/A;    Allergies Patient has no known allergies.  No family history on file.  Social History Social History   Tobacco Use  . Smoking status: Current Every Day Smoker    Packs/day: 1.00  . Smokeless tobacco: Never Used  Substance Use Topics  . Alcohol use: Yes    Comment: rarely  . Drug use: Never    Review of Systems  Constitutional: No fever/chills. Positive elevated CBG at home.  Eyes: No visual changes. ENT: No sore throat. Cardiovascular: Denies chest pain. Respiratory: Positive shortness of breath. Gastrointestinal: Positive right flank and abdominal pain. Positive nausea and vomiting.  No diarrhea.  No constipation Genitourinary: Mild dysuria and lower back pain.  Musculoskeletal: Negative for back pain. Skin: Negative for rash. Neurological: Negative for headaches, focal weakness or numbness.  10-point ROS otherwise negative.  ____________________________________________   PHYSICAL EXAM:  VITAL SIGNS: ED Triage Vitals  Enc Vitals Group     BP 01/14/20 0715 130/81  Pulse Rate 01/14/20 0715 (!) 105     Resp 01/14/20 0715 (!) 28     Temp 01/14/20 0715 98.1 F (36.7 C)     Temp Source 01/14/20 0715 Oral     SpO2 01/14/20 0708 100 %     Weight 01/14/20 0716 115 lb (52.2 kg)     Height 01/14/20 0716 5\' 7"  (1.702 m)   Constitutional: Alert and oriented. Well appearing and in no acute distress. Eyes: Conjunctivae are normal.  Head: Atraumatic. Nose: No  congestion/rhinnorhea. Mouth/Throat: Mucous membranes are dry.  Neck: No stridor.  Cardiovascular: Tachycardia. Good peripheral circulation. Grossly normal heart sounds.   Respiratory: Normal respiratory effort.  No retractions. Lungs CTAB. Gastrointestinal: Soft and nontender. No distention.  Musculoskeletal: No gross deformities of extremities. Neurologic:  Normal speech and language.  Skin:  Skin is warm, dry and intact. No rash noted.   ____________________________________________   LABS (all labs ordered are listed, but only abnormal results are displayed)  Labs Reviewed  CBC - Abnormal; Notable for the following components:      Result Value   WBC 13.6 (*)    Hemoglobin 15.4 (*)    HCT 48.4 (*)    MCV 103.4 (*)    All other components within normal limits  URINALYSIS, ROUTINE W REFLEX MICROSCOPIC - Abnormal; Notable for the following components:   Color, Urine STRAW (*)    Glucose, UA >=500 (*)    Hgb urine dipstick SMALL (*)    Ketones, ur 80 (*)    Protein, ur 100 (*)    All other components within normal limits  BLOOD GAS, VENOUS - Abnormal; Notable for the following components:   pH, Ven 7.117 (*)    pCO2, Ven 25.0 (*)    Bicarbonate 7.7 (*)    Acid-base deficit 21.1 (*)    All other components within normal limits  BETA-HYDROXYBUTYRIC ACID - Abnormal; Notable for the following components:   Beta-Hydroxybutyric Acid >8.00 (*)    All other components within normal limits  BASIC METABOLIC PANEL - Abnormal; Notable for the following components:   CO2 9 (*)    Glucose, Bld 405 (*)    BUN 21 (*)    Creatinine, Ser 1.24 (*)    Calcium 8.6 (*)    GFR calc non Af Amer 57 (*)    Anion gap 25 (*)    All other components within normal limits  HEPATIC FUNCTION PANEL - Abnormal; Notable for the following components:   Total Protein 9.1 (*)    Total Bilirubin 2.0 (*)    Indirect Bilirubin 1.9 (*)    All other components within normal limits  CBG MONITORING, ED -  Abnormal; Notable for the following components:   Glucose-Capillary 409 (*)    All other components within normal limits  SARS CORONAVIRUS 2 BY RT PCR (HOSPITAL ORDER, PERFORMED IN [facility])  URINE CULTURE  PREGNANCY, URINE  LIPASE, BLOOD  MAGNESIUM  CBG MONITORING, ED   ____________________________________________  EKG   EKG Interpretation  Date/Time:  Wednesday January 14 2020 07:39:48 EDT Ventricular Rate:  100 PR Interval:    QRS Duration: 85 QT Interval:  342 QTC Calculation: 442 R Axis:   82 Text Interpretation: Sinus tachycardia Probable left atrial enlargement No STEMI Confirmed by [id number] [phone]) on 01/14/2020 7:44:22 AM       ____________________________________________  RADIOLOGY  CT Renal Stone Study  Result Date: 01/14/2020 CLINICAL DATA:  Right flank pain for the past 2  days. History of kidney stones. EXAM: CT ABDOMEN AND PELVIS WITHOUT CONTRAST TECHNIQUE: Multidetector CT imaging of the abdomen and pelvis was performed following the standard protocol without IV contrast. COMPARISON:  CT abdomen pelvis dated November 24, 2019. FINDINGS: Lower chest: No acute abnormality. Hepatobiliary: 7 mm low-density lesion in the anterior left hepatic lobe is too small to characterize. Unchanged diffuse hepatic steatosis with more focal along the falciform ligament. The gallbladder is unremarkable. No biliary dilatation. Pancreas: Unremarkable. No pancreatic ductal dilatation or surrounding inflammatory changes. Spleen: Normal in size without focal abnormality. Adrenals/Urinary Tract: Adrenal glands are unremarkable. Kidneys are normal, without renal calculi, focal lesion, or hydronephrosis. Bladder is unremarkable. Stomach/Bowel: Stomach is within normal limits. Appendix appears normal. No evidence of bowel wall thickening, distention, or inflammatory changes. Vascular/Lymphatic: No significant vascular findings are present. No enlarged abdominal or pelvic lymph  nodes. Reproductive: Uterus and bilateral adnexa are unremarkable. Other: No abdominal wall hernia or abnormality. No abdominopelvic ascites. Resolved pelvic hematoma. No pneumoperitoneum. Musculoskeletal: No acute or significant osseous findings. IMPRESSION: 1. No acute intra-abdominal process. No urolithiasis. 2. Unchanged hepatic steatosis. 3. Resolved pelvic hematoma. Electronically Signed   By: Obie Dredge M.D.   On: 01/14/2020 09:15    ____________________________________________   PROCEDURES  Procedure(s) performed:   .Critical Care Performed by: Maia Plan, MD Authorized by: Maia Plan, MD   Critical care provider statement:    Critical care time (minutes):  35   Critical care time was exclusive of:  Separately billable procedures and treating other patients and teaching time   Critical care was necessary to treat or prevent imminent or life-threatening deterioration of the following conditions:  Endocrine crisis   Critical care was time spent personally by me on the following activities:  Blood draw for specimens, development of treatment plan with patient or surrogate, evaluation of patient's response to treatment, examination of patient, obtaining history from patient or surrogate, ordering and review of laboratory studies, ordering and performing treatments and interventions, ordering and review of radiographic studies, pulse oximetry, re-evaluation of patient's condition and review of old charts   I assumed direction of critical care for this patient from another provider in my specialty: no       ____________________________________________   INITIAL IMPRESSION / ASSESSMENT AND PLAN / ED COURSE  Pertinent labs & imaging results that were available during my care of the patient were reviewed by me and considered in my medical decision making (see chart for details).   Patient presents to the emergency department for evaluation of elevated blood sugar, shortness  of breath, concern for DKA.  She is having some mild to moderate UTI symptoms and has pain similar to kidney stones in the past which could be precipitating event to DKA.  Will start IV fluid bolus pending lab work.  I have added on VBG, beta hydroxybutyric acid, magnesium, and hepatic function panel.  Will scan for nephrolithiasis with CT renal pending a pregnancy test.   10:13 AM   initial blood work clotted and labs had to be resent.  Results showing DKA with potassium of 5.  No EKG changes. COVID negative. VBG with acidosis. IVF fluid given x 2 L. Will start insulin infusion and admit. CT renal without stone or other acute process.   10:28 AM  Discussed patient's case with [facility], Dr. Denton Lank to request admission. Patient and family (if present) updated with plan. Care transferred to [facility] service.  I reviewed all nursing notes, vitals, pertinent old records,  EKGs, labs, imaging (as available).  ____________________________________________  FINAL CLINICAL IMPRESSION(S) / ED DIAGNOSES  Final diagnoses:  Diabetic ketoacidosis without coma associated with type 1 diabetes mellitus (HCC)  Generalized abdominal pain     MEDICATIONS GIVEN DURING THIS VISIT:  Medications  insulin regular, human (MYXREDLIN) 100 units/ 100 mL infusion (has no administration in time range)  0.9 %  sodium chloride infusion (has no administration in time range)  dextrose 5 %-0.45 % sodium chloride infusion (has no administration in time range)  dextrose 50 % solution 0-50 mL (has no administration in time range)  sodium chloride 0.9 % bolus 1,000 mL (0 mLs Intravenous Stopped 01/14/20 0855)  promethazine (PHENERGAN) injection 12.5 mg (12.5 mg Intravenous Given 01/14/20 0742)  sodium chloride 0.9 % bolus 1,000 mL (0 mLs Intravenous Stopped 01/14/20 1019)  alum & mag hydroxide-simeth (MAALOX/MYLANTA) 200-200-20 MG/5ML suspension 30 mL (30 mLs Oral Given 01/14/20 0922)    And  lidocaine (XYLOCAINE) 2 % viscous mouth  solution 15 mL (15 mLs Oral Given 01/14/20 6144)    Note:  This document was prepared using Dragon voice recognition software and may include unintentional dictation errors.  Alona Bene, MD, [facility] Emergency Medicine    Jovonta Levit, Arlyss Repress, MD 01/14/20 1029

## 2020-01-14 NOTE — ED Notes (Signed)
RN in ICU states bed is not yet clean that they will call once bed is clean

## 2020-01-14 NOTE — ED Notes (Signed)
Date and time results received: 01/14/20 8:08 AM  (use smartphrase ".now" to insert current time)  Test: Venous ph Critical Value: 7.117  Name of Provider Notified: Dr. Jacqulyn Bath  Orders Received? Or Actions Taken?:

## 2020-01-15 ENCOUNTER — Other Ambulatory Visit: Payer: Self-pay

## 2020-01-15 DIAGNOSIS — I5022 Chronic systolic (congestive) heart failure: Secondary | ICD-10-CM

## 2020-01-15 LAB — BASIC METABOLIC PANEL
Anion gap: 11 (ref 5–15)
BUN: 16 mg/dL (ref 6–20)
CO2: 18 mmol/L — ABNORMAL LOW (ref 22–32)
Calcium: 8.9 mg/dL (ref 8.9–10.3)
Chloride: 108 mmol/L (ref 98–111)
Creatinine, Ser: 0.72 mg/dL (ref 0.44–1.00)
GFR calc Af Amer: >60 mL/min (ref >60)
GFR calc non Af Amer: >60 mL/min (ref >60)
Glucose, Bld: 161 mg/dL — ABNORMAL HIGH (ref 70–99)
Potassium: 3.4 mmol/L — ABNORMAL LOW (ref 3.5–5.1)
Sodium: 137 mmol/L (ref 135–145)

## 2020-01-15 LAB — RAPID URINE DRUG SCREEN, HOSP PERFORMED
Amphetamines: NOT DETECTED
Barbiturates: NOT DETECTED
Benzodiazepines: NOT DETECTED
Cocaine: NOT DETECTED
Opiates: POSITIVE — AB
Tetrahydrocannabinol: NOT DETECTED

## 2020-01-15 LAB — GLUCOSE, CAPILLARY
Glucose-Capillary: 134 mg/dL — ABNORMAL HIGH (ref 70–99)
Glucose-Capillary: 141 mg/dL — ABNORMAL HIGH (ref 70–99)
Glucose-Capillary: 142 mg/dL — ABNORMAL HIGH (ref 70–99)
Glucose-Capillary: 159 mg/dL — ABNORMAL HIGH (ref 70–99)
Glucose-Capillary: 161 mg/dL — ABNORMAL HIGH (ref 70–99)
Glucose-Capillary: 172 mg/dL — ABNORMAL HIGH (ref 70–99)
Glucose-Capillary: 173 mg/dL — ABNORMAL HIGH (ref 70–99)
Glucose-Capillary: 177 mg/dL — ABNORMAL HIGH (ref 70–99)
Glucose-Capillary: 183 mg/dL — ABNORMAL HIGH (ref 70–99)
Glucose-Capillary: 187 mg/dL — ABNORMAL HIGH (ref 70–99)
Glucose-Capillary: 306 mg/dL — ABNORMAL HIGH (ref 70–99)

## 2020-01-15 LAB — HEMOGLOBIN A1C
Hgb A1c MFr Bld: 8.5 % — ABNORMAL HIGH (ref 4.8–5.6)
Mean Plasma Glucose: 197 mg/dL

## 2020-01-15 LAB — URINE CULTURE: Culture: NO GROWTH

## 2020-01-15 MED ORDER — ONDANSETRON HCL 4 MG/2ML IJ SOLN
4.0000 mg | Freq: Once | INTRAMUSCULAR | Status: AC
  Administered 2020-01-15: 4 mg via INTRAVENOUS
  Filled 2020-01-15: qty 2

## 2020-01-15 MED ORDER — INSULIN GLARGINE 100 UNIT/ML ~~LOC~~ SOLN
25.0000 [IU] | Freq: Every day | SUBCUTANEOUS | Status: DC
  Administered 2020-01-15 – 2020-01-16 (×2): 25 [IU] via SUBCUTANEOUS
  Filled 2020-01-15 (×2): qty 0.25

## 2020-01-15 MED ORDER — POTASSIUM CHLORIDE CRYS ER 20 MEQ PO TBCR
40.0000 meq | EXTENDED_RELEASE_TABLET | Freq: Once | ORAL | Status: AC
  Administered 2020-01-15: 40 meq via ORAL
  Filled 2020-01-15: qty 2

## 2020-01-15 MED ORDER — INSULIN ASPART 100 UNIT/ML ~~LOC~~ SOLN
0.0000 [IU] | Freq: Every day | SUBCUTANEOUS | Status: DC
  Administered 2020-01-15: 4 [IU] via SUBCUTANEOUS

## 2020-01-15 MED ORDER — INSULIN ASPART 100 UNIT/ML ~~LOC~~ SOLN
0.0000 [IU] | Freq: Three times a day (TID) | SUBCUTANEOUS | Status: DC
  Administered 2020-01-15: 1 [IU] via SUBCUTANEOUS
  Administered 2020-01-15: 2 [IU] via SUBCUTANEOUS
  Administered 2020-01-16: 3 [IU] via SUBCUTANEOUS
  Administered 2020-01-16 – 2020-01-17 (×2): 5 [IU] via SUBCUTANEOUS
  Administered 2020-01-17: 3 [IU] via SUBCUTANEOUS
  Administered 2020-01-18: 5 [IU] via SUBCUTANEOUS
  Administered 2020-01-18: 7 [IU] via SUBCUTANEOUS

## 2020-01-15 NOTE — Progress Notes (Signed)
Inpatient Diabetes Program Recommendations  AACE/ADA: New Consensus Statement on Inpatient Glycemic Control (2015)  Target Ranges:  Prepandial:   less than 140 mg/dL      Peak postprandial:   less than 180 mg/dL (1-2 hours)      Critically ill patients:  140 - 180 mg/dL   Lab Results  Component Value Date   GLUCAP 187 (H) 01/15/2020   HGBA1C 8.5 (H) 01/14/2020    Review of Glycemic Control  Spoke with pt at bedside on 6/16. Pt states she ran out of Lantus and tried to cover with only Novolog, which did not keep blood sugars controlled. Does not have PCP. States she gets her insulin from the hospital. Has previously gone to [facility], although missed some appts. Needs to get back in with [facility] for MD to manage her diabetes and will be able to get her insulin there at the clinic.  Blood sugars 142, 183, 187 mg/dL this am. AG - 11, CO2 - 18. Transitioning off drip this am.   Inpatient Diabetes Program Recommendations:    Lantus 25 units QD Novolog 0-9 units tidwc and hs  Will need meal coverage insulin when eating > 50% meal.  TOC to assist with PCP and medication needs.  Thank you. Ailene Ards, RD, LDN, CDE Inpatient Diabetes Coordinator [phone]

## 2020-01-15 NOTE — Progress Notes (Signed)
PROGRESS NOTE    Madison Williams  NGE:[id number] DOB: 1986/06/11 DOA: 01/14/2020 PCP: Patient, No Pcp Per   Brief Narrative:  34 year old female with history of DM 1, asthma, tobacco use, HTN, medical noncompliance admitted for nausea vomiting.  Diagnosed with DKA.  She admitted of noncompliance with her insulin regimen.  Upon admission she was initially started on routine DKA protocol, transition to subcutaneous insulin.   Assessment & Plan:   Principal Problem:   DKA (diabetic ketoacidoses) ([place]) Active Problems:   AKI (acute kidney injury) ([place])   Substance withdrawal ([place])  Diabetic ketoacidosis, resolved Diabetes mellitus type 1, insulin-dependent-poorly controlled Nausea and vomiting, slowly improving -Currently on Endo tool, will transition patient off to subcutaneous Lantus.  Sliding scale. -Advance diet as tolerated. -Supportive care. A1c 8.5 -Seen by diabetic coordinator. -UA-negative; chck UDS -CT renal study-negative for any acute intra-abdominal process.    Acute kidney injury -Admission creatinine 1.24, resolved.  Currently at baseline of 0.7.  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 -Currently denies any chest pain.  Supportive care. -Continue home medications.  Essential hypertension -On Norvasc and Coreg  GERD -PPI    DVT prophylaxis: enoxaparin (LOVENOX) injection 40 mg Start: 01/14/20 1100   Code Status: Full code Family Communication: None  Status is: Inpatient  Remains inpatient appropriate because:Inpatient level of care appropriate due to severity of illness   Dispo: The patient is from: Home              Anticipated d/c is to: Home              Anticipated d/c date is: 1 day              Patient currently is not medically stable to d/c.  Will be transitioning patient from insulin drip to subcutaneous insulin.  Still not tolerating orals, slowly advance diet as tolerated.    Subjective: Patient had mild  nausea when I saw her this morning.  Tells me she has not been following with anyone outpatient, she had appointment with [facility] outpatient but there was some confusion regarding her video visit versus in person visit.  She ran out of her insulin quite some time ago and has been only managing her diabetes with short acting insulin.  Review of Systems Otherwise negative except as per HPI, including: General: Denies fever, chills, night sweats or unintended weight loss. Resp: Denies cough, wheezing, shortness of breath. Cardiac: Denies chest pain, palpitations, orthopnea, paroxysmal nocturnal dyspnea. GI: Denies vomiting, diarrhea or constipation GU: Denies dysuria, frequency, hesitancy or incontinence MS: Denies muscle aches, joint pain or swelling Neuro: Denies headache, neurologic deficits (focal weakness, numbness, tingling), abnormal gait Psych: Denies anxiety, depression, SI/HI/AVH Skin: Denies new rashes or lesions ID: Denies sick contacts, exotic exposures, travel  Examination:  General exam: Appears calm and comfortable  Respiratory system: Clear to auscultation. Respiratory effort normal. Cardiovascular system: S1 & S2 heard, RRR. No JVD, murmurs, rubs, gallops or clicks. No pedal edema. Gastrointestinal system: Abdomen is nondistended, soft and nontender. No organomegaly or masses felt. Normal bowel sounds heard. Central nervous system: Alert and oriented. No focal neurological deficits. Extremities: Symmetric 5 x 5 power. Skin: No rashes, lesions or ulcers Psychiatry: Judgement and insight appear normal. Mood & affect appropriate.     Objective: Vitals:   01/15/20 0300 01/15/20 0400 01/15/20 0600 01/15/20 0800  BP: (!) 159/75 (!) 170/78 (!) 157/77   Pulse: 84 74 84   Resp: (!) 26 (!) 21 (!)  27   Temp:  99.1 F (37.3 C)  98.7 F (37.1 C)  TempSrc:  Oral  Oral  SpO2: 100% 99% 100%   Weight:      Height:        Intake/Output Summary (Last 24 hours)  at 01/15/2020 1042 Last data filed at 01/15/2020 0600 Gross per 24 hour  Intake 1473.47 ml  Output --  Net 1473.47 ml   Filed Weights   01/14/20 0716  Weight: 52.2 kg     Data Reviewed:   CBC: Recent Labs  Lab 01/14/20 0738  WBC 13.6*  HGB 15.4*  HCT 48.4*  MCV 103.4*  PLT 281   Basic Metabolic Panel: Recent Labs  Lab 01/14/20 0731 01/14/20 0731 01/14/20 1055 01/14/20 1358 01/14/20 1836 01/14/20 2158 01/15/20 0229  NA 137   < > 138 140 139 140 137  K 5.0   < > 4.5 3.4* 3.5 3.6 3.4*  CL 103   < > 108 111 111 110 108  CO2 9*   < > 7* 13* 17* 16* 18*  GLUCOSE 405*   < > 337* 242* 156* 133* 161*  BUN 21*   < > 18 16 17 16 16   CREATININE 1.24*   < > 1.08* 0.92 0.75 0.70 0.72  CALCIUM 8.6*   < > 8.0* 8.6* 8.8* 9.0 8.9  MG 2.2  --   --   --   --   --   --    < > = values in this interval not displayed.   GFR: Estimated Creatinine Clearance: 82.4 mL/min (by C-G formula based on SCr of 0.72 mg/dL). Liver Function Tests: Recent Labs  Lab 01/14/20 0731  AST 21  ALT 34  ALKPHOS 114  BILITOT 2.0*  PROT 9.1*  ALBUMIN 4.9   Recent Labs  Lab 01/14/20 0731  LIPASE 19   No results for input(s): AMMONIA in the last 168 hours. Coagulation Profile: No results for input(s): INR, PROTIME in the last 168 hours. Cardiac Enzymes: No results for input(s): CKTOTAL, CKMB, CKMBINDEX, TROPONINI in the last 168 hours. BNP (last 3 results) No results for input(s): PROBNP in the last 8760 hours. HbA1C: Recent Labs    01/14/20 1055  HGBA1C 8.5*   CBG: Recent Labs  Lab 01/15/20 0216 01/15/20 0421 01/15/20 0626 01/15/20 0632 01/15/20 0822  GLUCAP 159* 142* 177* 183* 187*   Lipid Profile: No results for input(s): CHOL, HDL, LDLCALC, TRIG, CHOLHDL, LDLDIRECT in the last 72 hours. Thyroid Function Tests: No results for input(s): TSH, T4TOTAL, FREET4, T3FREE, THYROIDAB in the last 72 hours. Anemia Panel: No results for input(s): VITAMINB12, FOLATE, FERRITIN, TIBC,  IRON, RETICCTPCT in the last 72 hours. Sepsis Labs: No results for input(s): PROCALCITON, LATICACIDVEN in the last 168 hours.  Recent Results (from the past 240 hour(s))  Urine culture     Status: None   Collection Time: 01/14/20  7:38 AM   Specimen: Urine, Clean Catch  Result Value Ref Range Status   Specimen Description   Final    URINE, CLEAN CATCH Performed at [facility], [address] [place], [place]    Special Requests   Final    NONE Performed at [facility], [address], [place], [place]    Culture   Final    NO GROWTH Performed at [facility], [address], [place], [place]    Report Status 01/15/2020 FINAL  Final  SARS Coronavirus  2 by RT PCR (hospital order, performed in [facility] hospital lab) Nasopharyngeal Nasopharyngeal Swab     Status: None   Collection Time: 01/14/20  7:38 AM   Specimen: Nasopharyngeal Swab  Result Value Ref Range Status   SARS Coronavirus 2 NEGATIVE NEGATIVE Final    Comment: (NOTE) SARS-CoV-2 target nucleic acids are NOT DETECTED.  The SARS-CoV-2 RNA is generally detectable in upper and lower respiratory specimens during the acute phase of infection. The lowest concentration of SARS-CoV-2 viral copies this assay can detect is 250 copies / mL. A negative result does not preclude SARS-CoV-2 infection and should not be used as the sole basis for treatment or other patient management decisions.  A negative result may occur with improper specimen collection / handling, submission of specimen other than nasopharyngeal swab, presence of viral mutation(s) within the areas targeted by this assay, and inadequate number of viral copies (<250 copies / mL). A negative result must be combined with clinical observations, patient history, and epidemiological information.  Fact Sheet for Patients:   BoilerBrush.com.cy  Fact Sheet for Healthcare  Providers: https://pope.com/  This test is not yet approved or  cleared by the [place] FDA and has been authorized for detection and/or diagnosis of SARS-CoV-2 by FDA under an Emergency Use Authorization (EUA).  This EUA will remain in effect (meaning this test can be used) for the duration of the COVID-19 declaration under Section 564(b)(1) of the Act, 21 U.S.C. section 360bbb-3(b)(1), unless the authorization is terminated or revoked sooner.  Performed at [facility], [address] [place], [place] [address]   MRSA PCR Screening     Status: None   Collection Time: 01/14/20 11:46 AM   Specimen: Nasal Mucosa; Nasopharyngeal  Result Value Ref Range Status   MRSA by PCR NEGATIVE NEGATIVE Final    Comment:        The GeneXpert MRSA Assay (FDA approved for NASAL specimens only), is one component of a comprehensive MRSA colonization surveillance program. It is not intended to diagnose MRSA infection nor to guide or monitor treatment for MRSA infections. Performed at [facility], [address], [place], [place] [address]          Radiology Studies: CT Renal Stone Study  Result Date: 01/14/2020 CLINICAL DATA:  Right flank pain for the past 2 days. History of kidney stones. EXAM: CT ABDOMEN AND PELVIS WITHOUT CONTRAST TECHNIQUE: Multidetector CT imaging of the abdomen and pelvis was performed following the standard protocol without IV contrast. COMPARISON:  CT abdomen pelvis dated November 24, 2019. FINDINGS: Lower chest: No acute abnormality. Hepatobiliary: 7 mm low-density lesion in the anterior left hepatic lobe is too small to characterize. Unchanged diffuse hepatic steatosis with more focal along the falciform ligament. The gallbladder is unremarkable. No biliary dilatation. Pancreas: Unremarkable. No pancreatic ductal dilatation or surrounding inflammatory changes. Spleen: Normal in size without focal  abnormality. Adrenals/Urinary Tract: Adrenal glands are unremarkable. Kidneys are normal, without renal calculi, focal lesion, or hydronephrosis. Bladder is unremarkable. Stomach/Bowel: Stomach is within normal limits. Appendix appears normal. No evidence of bowel wall thickening, distention, or inflammatory changes. Vascular/Lymphatic: No significant vascular findings are present. No enlarged abdominal or pelvic lymph nodes. Reproductive: Uterus and bilateral adnexa are unremarkable. Other: No abdominal wall hernia or abnormality. No abdominopelvic ascites. Resolved pelvic hematoma. No pneumoperitoneum. Musculoskeletal: No acute or significant osseous findings. IMPRESSION: 1. No acute intra-abdominal process. No urolithiasis. 2. Unchanged hepatic steatosis. 3. Resolved pelvic hematoma. Electronically Signed  By: Obie Dredge M.D.   On: 01/14/2020 09:15        Scheduled Meds: . amLODipine  2.5 mg Oral Daily  . aspirin EC  81 mg Oral Daily  . carvedilol  3.125 mg Oral BID WC  . Chlorhexidine Gluconate Cloth  6 each Topical Daily  . enoxaparin (LOVENOX) injection  40 mg Subcutaneous Q24H  . insulin aspart  0-5 Units Subcutaneous QHS  . insulin aspart  0-9 Units Subcutaneous TID WC  . insulin glargine  25 Units Subcutaneous Daily  . mouth rinse  15 mL Mouth Rinse BID  . nicotine  14 mg Transdermal Daily  . pantoprazole  40 mg Oral QHS  . sodium chloride flush  10-40 mL Intracatheter Q12H   Continuous Infusions: . sodium chloride    . sodium chloride Stopped (01/14/20 1222)  . dextrose 5 % and 0.45% NaCl    . dextrose 5 % and 0.45% NaCl 75 mL/hr at 01/15/20 0600  . insulin 1 mL/hr at 01/15/20 0600     LOS: 1 day   Time spent= 35 mins    Coleston Dirosa Joline Maxcy, MD [facility]  If 7PM-7AM, please contact night-coverage  01/15/2020, 10:42 AM

## 2020-01-15 NOTE — TOC Initial Note (Signed)
Transition of Care [facility]) - Initial/Assessment Note    Patient Details  Name: Madison Williams MRN: [id number] Date of Birth: [birth year]  [facility] CM/SW Contact:    Golda Acre, RN Phone Number: 01/15/2020, 8:06 AM  Clinical Narrative:                 From home has pcp plan is to return to home.  Expected Discharge Plan: Home/Self Care Barriers to Discharge: Continued Medical Work up   Patient Goals and CMS Choice Patient states their goals for this hospitalization and ongoing recovery are:: to go home   Choice offered to / list presented to : Patient  Expected Discharge Plan and Services Expected Discharge Plan: Home/Self Care   Discharge Planning Services: CM Consult   Living arrangements for the past 2 months: Apartment                                      Prior Living Arrangements/Services Living arrangements for the past 2 months: Apartment Lives with:: Self Patient language and need for interpreter reviewed:: No Do you feel safe going back to the place where you live?: Yes      Need for Family Participation in Patient Care: No (Comment) (lives alone) Care giver support system in place?: No (comment) (lives alone)   Criminal Activity/Legal Involvement Pertinent to Current Situation/Hospitalization: No - Comment as needed  Activities of Daily Living Home Assistive Devices/Equipment: Eyeglasses, CBG Meter ADL Screening (condition at time of admission) Patient's cognitive ability adequate to safely complete daily activities?: Yes Is the patient deaf or have difficulty hearing?: No Does the patient have difficulty seeing, even when wearing glasses/contacts?: No Does the patient have difficulty concentrating, remembering, or making decisions?: No Patient able to express need for assistance with ADLs?: Yes Does the patient have difficulty dressing or bathing?: No Independently performs ADLs?: Yes (appropriate for developmental  age) Does the patient have difficulty walking or climbing stairs?: No Weakness of Legs: None Weakness of Arms/Hands: None  Permission Sought/Granted                  Emotional Assessment Appearance:: Appears stated age     Orientation: : Oriented to Self, Oriented to Place, Oriented to  Time, Oriented to Situation Alcohol / Substance Use: Not Applicable Psych Involvement: No (comment)  Admission diagnosis:  DKA (diabetic ketoacidoses) (H[facility]) [E11.10] Generalized abdominal pain [R10.84] Diabetic ketoacidosis without coma associated with type 1 diabetes mellitus (HCC) [E10.10] Patient Active Problem List   Diagnosis Date Noted  . Substance withdrawal ([facility]) 01/14/2020  . SOB (shortness of breath)   . Hypokalemia   . Macrocytosis without anemia   . Retroperitoneal hematoma   . DKA (diabetic ketoacidosis) (HCC) 11/24/2019  . Acute ST elevation myocardial infarction (STEMI) of anterolateral wall (HCC)   . Abnormal EKG   . Elevated troponin   . Nausea & vomiting 10/16/2019  . Abdominal pain 10/16/2019  . Chest tightness   . Mild intermittent asthma without complication   . Acute vomiting 01/31/2019  . Type 1 diabetes mellitus with hyperglycemia (HCC) 01/31/2019  . Uninsured 01/31/2019  . DKA (diabetic ketoacidoses) (HCC) 03/30/2018  . AKI (acute kidney injury) (HCC) 03/30/2018  . Leukocytosis 03/30/2018  . Dehydration 03/30/2018  . Tobacco use disorder 03/30/2018   PCP:  Patient, No Pcp Per Pharmacy:   [facility] [place], [place] -  [address] [place] [address] Phone: [phone] Fax: [phone]  [id number] - [place], [place] AT [address] [place] [address] Phone: [phone] Fax: [phone]  [id number] - [place], [place]. AT [address] [place]  [address] Phone: [phone] Fax: [phone]     Social Determinants of Health (SDOH) Interventions    Readmission Risk Interventions No flowsheet data found.

## 2020-01-16 ENCOUNTER — Encounter (HOSPITAL_COMMUNITY): Payer: Self-pay | Admitting: Internal Medicine

## 2020-01-16 LAB — CBC
HCT: 38.3 % (ref 36.0–46.0)
Hemoglobin: 12.5 g/dL (ref 12.0–15.0)
MCH: 32.3 pg (ref 26.0–34.0)
MCHC: 32.6 g/dL (ref 30.0–36.0)
MCV: 99 fL (ref 80.0–100.0)
Platelets: 144 10*3/uL — ABNORMAL LOW (ref 150–400)
RBC: 3.87 MIL/uL (ref 3.87–5.11)
RDW: 13.1 % (ref 11.5–15.5)
WBC: 5 10*3/uL (ref 4.0–10.5)
nRBC: 0 % (ref 0.0–0.2)

## 2020-01-16 LAB — BASIC METABOLIC PANEL
Anion gap: 12 (ref 5–15)
BUN: 12 mg/dL (ref 6–20)
CO2: 22 mmol/L (ref 22–32)
Calcium: 8.4 mg/dL — ABNORMAL LOW (ref 8.9–10.3)
Chloride: 98 mmol/L (ref 98–111)
Creatinine, Ser: 0.61 mg/dL (ref 0.44–1.00)
GFR calc Af Amer: >60 mL/min (ref >60)
GFR calc non Af Amer: >60 mL/min (ref >60)
Glucose, Bld: 301 mg/dL — ABNORMAL HIGH (ref 70–99)
Potassium: 3.3 mmol/L — ABNORMAL LOW (ref 3.5–5.1)
Sodium: 132 mmol/L — ABNORMAL LOW (ref 135–145)

## 2020-01-16 LAB — GLUCOSE, CAPILLARY
Glucose-Capillary: 265 mg/dL — ABNORMAL HIGH (ref 70–99)
Glucose-Capillary: 201 mg/dL — ABNORMAL HIGH (ref 70–99)
Glucose-Capillary: 113 mg/dL — ABNORMAL HIGH (ref 70–99)
Glucose-Capillary: 184 mg/dL — ABNORMAL HIGH (ref 70–99)

## 2020-01-16 LAB — MAGNESIUM: Magnesium: 2.1 mg/dL (ref 1.7–2.4)

## 2020-01-16 MED ORDER — SODIUM CHLORIDE 0.9 % IV SOLN: INTRAVENOUS | Status: AC

## 2020-01-16 MED ORDER — ONDANSETRON HCL 4 MG/2ML IJ SOLN
4.0000 mg | Freq: Three times a day (TID) | INTRAMUSCULAR | Status: DC | PRN
  Administered 2020-01-16 – 2020-01-18 (×3): 4 mg via INTRAVENOUS
  Filled 2020-01-16 (×3): qty 2

## 2020-01-16 MED ORDER — POTASSIUM CHLORIDE CRYS ER 20 MEQ PO TBCR: 40.0000 meq | EXTENDED_RELEASE_TABLET | Freq: Once | ORAL | Status: DC

## 2020-01-16 MED ORDER — SUCRALFATE 1 GM/10ML PO SUSP
1.0000 g | Freq: Three times a day (TID) | ORAL | Status: DC
  Administered 2020-01-16 – 2020-01-18 (×9): 1 g via ORAL
  Filled 2020-01-16 (×9): qty 10

## 2020-01-16 MED ORDER — PROMETHAZINE HCL 25 MG/ML IJ SOLN
25.0000 mg | Freq: Four times a day (QID) | INTRAMUSCULAR | Status: DC | PRN
  Administered 2020-01-16 – 2020-01-18 (×7): 25 mg via INTRAVENOUS
  Filled 2020-01-16 (×7): qty 1

## 2020-01-16 MED ORDER — INSULIN GLARGINE 100 UNIT/ML ~~LOC~~ SOLN
30.0000 [IU] | Freq: Every day | SUBCUTANEOUS | Status: DC
  Administered 2020-01-17 – 2020-01-18 (×2): 30 [IU] via SUBCUTANEOUS
  Filled 2020-01-16 (×2): qty 0.3

## 2020-01-16 MED ORDER — POTASSIUM CHLORIDE CRYS ER 20 MEQ PO TBCR
40.0000 meq | EXTENDED_RELEASE_TABLET | Freq: Once | ORAL | Status: AC
  Administered 2020-01-16: 40 meq via ORAL
  Filled 2020-01-16: qty 2

## 2020-01-16 MED ORDER — PANTOPRAZOLE SODIUM 40 MG PO TBEC
40.0000 mg | DELAYED_RELEASE_TABLET | Freq: Two times a day (BID) | ORAL | Status: DC
  Administered 2020-01-16 – 2020-01-18 (×4): 40 mg via ORAL
  Filled 2020-01-16 (×4): qty 1

## 2020-01-16 NOTE — Progress Notes (Signed)
Inpatient Diabetes Program Recommendations  AACE/ADA: New Consensus Statement on Inpatient Glycemic Control (2015)  Target Ranges:  Prepandial:   less than 140 mg/dL      Peak postprandial:   less than 180 mg/dL (1-2 hours)      Critically ill patients:  140 - 180 mg/dL   Lab Results  Component Value Date   GLUCAP 265 (H) 01/16/2020   HGBA1C 8.5 (H) 01/14/2020    Review of Glycemic Control  Blood sugars 301, 265 mg/dL this am. Needs insulin titration.  Inpatient Diabetes Program Recommendations:    Increase Lantus to 30 units QD Increase Novolog to 6 units tidwc for meal coverage insulin if eating > 50% meal  Working with TOC for PCP, meds at d/c. Continue to follow.  Thank you. Ailene Ards, RD, LDN, CDE Inpatient Diabetes Coordinator [phone]

## 2020-01-16 NOTE — Progress Notes (Signed)
PROGRESS NOTE    Reynalda Canny  PPJ:[id number] DOB: [birth year] DOA: 01/14/2020 PCP: Patient, No Pcp Per   Brief Narrative:  34 year old female with history of DM 1, asthma, tobacco use, HTN, medical noncompliance admitted for nausea vomiting.  Diagnosed with DKA.  She admitted of noncompliance with her insulin regimen.  Upon admission she was initially started on routine DKA protocol, transition to subcutaneous insulin.   Assessment & Plan:   Principal Problem:   DKA (diabetic ketoacidoses) (HCC) Active Problems:   AKI (acute kidney injury) (HCC)   Substance withdrawal (HCC)  Diabetic ketoacidosis, resolved Diabetes mellitus type 1, insulin-dependent-poorly controlled Nausea and vomiting, slowly improving -DKA is resolved. Currently on Lantus 25 units daily, will increase it to 30 units daily with sliding scale. -Supportive care. A1c 8.5 -Seen by diabetic coordinator. Appreciate input -UA-negative; UDS test positive for opioids -CT renal study-negative for any acute intra-abdominal process.    Nausea and vomiting -Poor tolerance. Suspect gastritis/esophagitis?. Will change PPI to twice daily, add Carafate. Antiemetics as needed. Diet as tolerated. Continue IV fluids. If does not improve then will need GI consultation  Acute kidney injury, resolved -Admission creatinine 1.24, resolved.  Currently at baseline of 0.7.  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 -Currently denies any chest pain.  Supportive care. -Continue home medications.  Essential hypertension -On Norvasc and Coreg  GERD -PPI  Patient is taking Kratom at home-herbal supplement-stimulant/opioid. Unsure if she is undergoing withdrawal from this. I explained her to stop using this. Continue to monitor.  DVT prophylaxis: enoxaparin (LOVENOX) injection 40 mg Start: 01/14/20 1100   Code Status: Full code Family Communication: None  Status is: Inpatient  Remains inpatient  appropriate because:Inpatient level of care appropriate due to severity of illness   Dispo: The patient is from: Home              Anticipated d/c is to: Home              Anticipated d/c date is: 1 day              Patient currently is not medically stable to d/c. Patient still having nausea and quite a bit of heartburn. Having hard time tolerating p.o.  Subjective: Was not able to tolerate p.o. diet overnight and this morning. Having some nausea and minimal oral intake. Tells me she is having some heartburn.  Review of Systems Otherwise negative except as per HPI, including: General: Denies fever, chills, night sweats or unintended weight loss. Resp: Denies cough, wheezing, shortness of breath. Cardiac: Denies chest pain, palpitations, orthopnea, paroxysmal nocturnal dyspnea. GI: Denies , diarrhea or constipation GU: Denies dysuria, frequency, hesitancy or incontinence MS: Denies muscle aches, joint pain or swelling Neuro: Denies headache, neurologic deficits (focal weakness, numbness, tingling), abnormal gait Psych: Denies anxiety, depression, SI/HI/AVH Skin: Denies new rashes or lesions ID: Denies sick contacts, exotic exposures, travel  Examination:  Constitutional: Not in acute distress, dry mouth Respiratory: Clear to auscultation bilaterally Cardiovascular: Normal sinus rhythm, no rubs Abdomen: Nontender nondistended good bowel sounds Musculoskeletal: No edema noted Skin: No rashes seen Neurologic: CN 2-12 grossly intact.  And nonfocal Psychiatric: Normal judgment and insight. Alert and oriented x 3. Normal mood.  Objective: Vitals:   01/16/20 0900 01/16/20 1000 01/16/20 1100 01/16/20 1115  BP: (!) 161/109 (!) 148/103 130/73 130/73  Pulse: 61 69 64   Resp: 16 13 18    Temp:      TempSrc:      SpO2: 100%  98% 98%   Weight:      Height:        Intake/Output Summary (Last 24 hours) at 01/16/2020 1142 Last data filed at 01/16/2020 0100 Gross per 24 hour  Intake 200  ml  Output 900 ml  Net -700 ml   Filed Weights   01/14/20 0716  Weight: 52.2 kg     Data Reviewed:   CBC: Recent Labs  Lab 01/14/20 0738 01/16/20 0424  WBC 13.6* 5.0  HGB 15.4* 12.5  HCT 48.4* 38.3  MCV 103.4* 99.0  PLT 281 867*   Basic Metabolic Panel: Recent Labs  Lab 01/14/20 0731 01/14/20 1055 01/14/20 1358 01/14/20 1836 01/14/20 2158 01/15/20 0229 01/16/20 0424  NA 137   < > 140 139 140 137 132*  K 5.0   < > 3.4* 3.5 3.6 3.4* 3.3*  CL 103   < > 111 111 110 108 98  CO2 9*   < > 13* 17* 16* 18* 22  GLUCOSE 405*   < > 242* 156* 133* 161* 301*  BUN 21*   < > 16 17 16 16 12   CREATININE 1.24*   < > 0.92 0.75 0.70 0.72 0.61  CALCIUM 8.6*   < > 8.6* 8.8* 9.0 8.9 8.4*  MG 2.2  --   --   --   --   --  2.1   < > = values in this interval not displayed.   GFR: Estimated Creatinine Clearance: 82.4 mL/min (by C-G formula based on SCr of 0.61 mg/dL). Liver Function Tests: Recent Labs  Lab 01/14/20 0731  AST 21  ALT 34  ALKPHOS 114  BILITOT 2.0*  PROT 9.1*  ALBUMIN 4.9   Recent Labs  Lab 01/14/20 0731  LIPASE 19   No results for input(s): AMMONIA in the last 168 hours. Coagulation Profile: No results for input(s): INR, PROTIME in the last 168 hours. Cardiac Enzymes: No results for input(s): CKTOTAL, CKMB, CKMBINDEX, TROPONINI in the last 168 hours. BNP (last 3 results) No results for input(s): PROBNP in the last 8760 hours. HbA1C: Recent Labs    01/14/20 1055  HGBA1C 8.5*   CBG: Recent Labs  Lab 01/15/20 0939 01/15/20 1205 01/15/20 1739 01/15/20 2138 01/16/20 0809  GLUCAP 172* 134* 173* 306* 265*   Lipid Profile: No results for input(s): CHOL, HDL, LDLCALC, TRIG, CHOLHDL, LDLDIRECT in the last 72 hours. Thyroid Function Tests: No results for input(s): TSH, T4TOTAL, FREET4, T3FREE, THYROIDAB in the last 72 hours. Anemia Panel: No results for input(s): VITAMINB12, FOLATE, FERRITIN, TIBC, IRON, RETICCTPCT in the last 72 hours. Sepsis  Labs: No results for input(s): PROCALCITON, LATICACIDVEN in the last 168 hours.  Recent Results (from the past 240 hour(s))  Urine culture     Status: None   Collection Time: 01/14/20  7:38 AM   Specimen: Urine, Clean Catch  Result Value Ref Range Status   Specimen Description   Final    URINE, CLEAN CATCH Performed at [facility], [place] [address] [place], [place] [address]    Special Requests   Final    NONE Performed at [facility], [place] [address] [place], [place] [address]    Culture   Final    NO GROWTH Performed at [facility], [place] [address] [place], [place] [address]    Report Status 01/15/2020 FINAL  Final  SARS Coronavirus 2 by RT PCR (hospital order, performed in [facility] hospital lab) Nasopharyngeal Nasopharyngeal Swab  Status: None   Collection Time: 01/14/20  7:38 AM   Specimen: Nasopharyngeal Swab  Result Value Ref Range Status   SARS Coronavirus 2 NEGATIVE NEGATIVE Final    Comment: (NOTE) SARS-CoV-2 target nucleic acids are NOT DETECTED.  The SARS-CoV-2 RNA is generally detectable in upper and lower respiratory specimens during the acute phase of infection. The lowest concentration of SARS-CoV-2 viral copies this assay can detect is 250 copies / mL. A negative result does not preclude SARS-CoV-2 infection and should not be used as the sole basis for treatment or other patient management decisions.  A negative result may occur with improper specimen collection / handling, submission of specimen other than nasopharyngeal swab, presence of viral mutation(s) within the areas targeted by this assay, and inadequate number of viral copies (<250 copies / mL). A negative result must be combined with clinical observations, patient history, and epidemiological information.  Fact Sheet for Patients:   BoilerBrush.com.cy  Fact Sheet for Healthcare  Providers: https://pope.com/  This test is not yet approved or  cleared by the [place] FDA and has been authorized for detection and/or diagnosis of SARS-CoV-2 by FDA under an Emergency Use Authorization (EUA).  This EUA will remain in effect (meaning this test can be used) for the duration of the COVID-19 declaration under Section 564(b)(1) of the Act, 21 U.S.C. section 360bbb-3(b)(1), unless the authorization is terminated or revoked sooner.  Performed at [facility], [address], [place], [place] [address]   MRSA PCR Screening     Status: None   Collection Time: 01/14/20 11:46 AM   Specimen: Nasal Mucosa; Nasopharyngeal  Result Value Ref Range Status   MRSA by PCR NEGATIVE NEGATIVE Final    Comment:        The GeneXpert MRSA Assay (FDA approved for NASAL specimens only), is one component of a comprehensive MRSA colonization surveillance program. It is not intended to diagnose MRSA infection nor to guide or monitor treatment for MRSA infections. Performed at [facility], [address] [place], [place] [address]          Radiology Studies: No results found.      Scheduled Meds: . amLODipine  2.5 mg Oral Daily  . aspirin EC  81 mg Oral Daily  . carvedilol  3.125 mg Oral BID WC  . Chlorhexidine Gluconate Cloth  6 each Topical Daily  . enoxaparin (LOVENOX) injection  40 mg Subcutaneous Q24H  . insulin aspart  0-5 Units Subcutaneous QHS  . insulin aspart  0-9 Units Subcutaneous TID WC  . insulin glargine  25 Units Subcutaneous Daily  . mouth rinse  15 mL Mouth Rinse BID  . nicotine  14 mg Transdermal Daily  . pantoprazole  40 mg Oral BID AC  . sodium chloride flush  10-40 mL Intracatheter Q12H  . sucralfate  1 g Oral TID WC & HS   Continuous Infusions:    LOS: 2 days   Time spent= 35 mins    Hoyt Leanos Joline Maxcy, MD [facility]  If 7PM-7AM, please contact  night-coverage  01/16/2020, 11:42 AM

## 2020-01-16 NOTE — TOC Progression Note (Signed)
Transition of Care [facility]) - Progression Note    Patient Details  Name: Madison Williams MRN: [id number] Date of Birth: May 16, 1986  Transition of Care [facility]) CM/SW Contact  Golda Acre, RN Phone Number: 01/16/2020, 2:53 PM  Clinical Narrative:    Follow up hospital appointment with the [facility] is for [phone] at 1110.  instructions given to patient at time of dc.   Expected Discharge Plan: Home/Self Care Barriers to Discharge: Continued Medical Work up  Expected Discharge Plan and Services Expected Discharge Plan: Home/Self Care   Discharge Planning Services: Indigent Health Clinic, Follow-up appt scheduled, Medication Assistance   Living arrangements for the past 2 months: Apartment                                       Social Determinants of Health (SDOH) Interventions    Readmission Risk Interventions No flowsheet data found.

## 2020-01-17 DIAGNOSIS — E876 Hypokalemia: Secondary | ICD-10-CM

## 2020-01-17 LAB — CBC
HCT: 37.1 % (ref 36.0–46.0)
Hemoglobin: 12.1 g/dL (ref 12.0–15.0)
MCH: 31.7 pg (ref 26.0–34.0)
MCHC: 32.6 g/dL (ref 30.0–36.0)
MCV: 97.1 fL (ref 80.0–100.0)
Platelets: 152 10*3/uL (ref 150–400)
RBC: 3.82 MIL/uL — ABNORMAL LOW (ref 3.87–5.11)
RDW: 13.2 % (ref 11.5–15.5)
WBC: 4.3 10*3/uL (ref 4.0–10.5)
nRBC: 0 % (ref 0.0–0.2)

## 2020-01-17 LAB — BASIC METABOLIC PANEL
Anion gap: 8 (ref 5–15)
BUN: 9 mg/dL (ref 6–20)
CO2: 26 mmol/L (ref 22–32)
Calcium: 8 mg/dL — ABNORMAL LOW (ref 8.9–10.3)
Chloride: 98 mmol/L (ref 98–111)
Creatinine, Ser: 0.57 mg/dL (ref 0.44–1.00)
GFR calc Af Amer: >60 mL/min (ref >60)
GFR calc non Af Amer: >60 mL/min (ref >60)
Glucose, Bld: 198 mg/dL — ABNORMAL HIGH (ref 70–99)
Potassium: 2.6 mmol/L — CL (ref 3.5–5.1)
Sodium: 132 mmol/L — ABNORMAL LOW (ref 135–145)

## 2020-01-17 LAB — MAGNESIUM: Magnesium: 2.1 mg/dL (ref 1.7–2.4)

## 2020-01-17 LAB — GLUCOSE, CAPILLARY
Glucose-Capillary: 204 mg/dL — ABNORMAL HIGH (ref 70–99)
Glucose-Capillary: 284 mg/dL — ABNORMAL HIGH (ref 70–99)
Glucose-Capillary: 113 mg/dL — ABNORMAL HIGH (ref 70–99)
Glucose-Capillary: 179 mg/dL — ABNORMAL HIGH (ref 70–99)

## 2020-01-17 MED ORDER — POTASSIUM CHLORIDE 20 MEQ PO PACK
40.0000 meq | PACK | Freq: Two times a day (BID) | ORAL | Status: AC
  Administered 2020-01-17 (×2): 40 meq via ORAL
  Filled 2020-01-17 (×2): qty 2

## 2020-01-17 MED ORDER — POTASSIUM CHLORIDE CRYS ER 20 MEQ PO TBCR: 40.0000 meq | EXTENDED_RELEASE_TABLET | ORAL | Status: DC

## 2020-01-17 MED ORDER — POTASSIUM CHLORIDE 10 MEQ/100ML IV SOLN
10.0000 meq | INTRAVENOUS | Status: AC
  Administered 2020-01-17 (×4): 10 meq via INTRAVENOUS
  Filled 2020-01-17 (×4): qty 100

## 2020-01-17 NOTE — Progress Notes (Signed)
PROGRESS NOTE    Madison Williams   IRS:[id number]  DOB: 02-13-1986  PCP: Patient, No Pcp Per    DOA: 01/14/2020 LOS: 3   Brief Narrative   34 year old female with history of DM 1, asthma, tobacco use, HTN, medical noncompliance admitted for nausea vomiting.  Diagnosed with DKA.  She admitted of noncompliance with her insulin regimen.  Upon admission she was initially started on routine DKA protocol, transition to subcutaneous insulin.   Assessment & Plan   Principal Problem:   DKA (diabetic ketoacidoses) (HCC) Active Problems:   AKI (acute kidney injury) (HCC)   Substance withdrawal (HCC)   Hypokalemia  Hypokalemia -potassium this morning 2.6, likely due to patient poor p.o. intake and nausea vomiting..  Repleting by oral anxiety today.  Follow BMP.  Diabetic ketoacidosis, resolved Diabetes mellitus type 1, insulin-dependent-poorly controlled Nausea and vomiting, slowly improving -DKA is resolved. Currently on Lantus 30 units daily, with sliding scale Novolog. -Supportive care. A1c 8.5% -Seen by diabetic coordinator. Appreciate input -UA-negative; UDS test positive for opioids -CT renal study-negative for any acute intra-abdominal process.    Nausea and vomiting - improved Suspect gastritis/esophagitis?.   PO intake improving. Continue PPI twice daily, and Carafate. Antiemetics as needed. Diet as tolerated.  Continue IV fluids.  Consider GI consultation vs outpatient follow up if persistent.  Acute kidney injury, resolved -Admission creatinine 1.24, resolved.  Currently at baseline of 0.7.  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 -Currently denies any chest pain.  Supportive care. -Continue home medications.  Essential hypertension -On Norvasc and Coreg  GERD -PPI  Patient BMI: Body mass index is 18.01 kg/m.   DVT prophylaxis: enoxaparin (LOVENOX) injection 40 mg Start: 01/14/20 1100   Diet:  Diet Orders (From admission,  onward)    Start     Ordered   01/17/20 0755  Diet Carb Modified Fluid consistency: Thin; Room service appropriate? Yes  Diet effective now       Question Answer Comment  Diet-HS Snack? Nothing   Calorie Level Medium 1600-2000   Fluid consistency: Thin   Room service appropriate? Yes      01/17/20 0754            Code Status: Full Code    Subjective 01/17/20    Patient reports feeling somewhat better this morning.  Nausea vomiting has improved and she is beginning to tolerate oral intake.  States she cannot swallow large potassium pills.  She denies fevers or chills.  We discussed potassium repletion today and recheck tomorrow and she is agreeable.   Disposition Plan & Communication   Status is: Inpatient  Remains inpatient appropriate because:Persistent severe electrolyte disturbances   Dispo: The patient is from: Home              Anticipated d/c is to: Home              Anticipated d/c date is: 1 day              Patient currently is not medically stable to d/c.        Family Communication: none at bedside, will attempt to call    Objective   Vitals:   01/16/20 1334 01/16/20 2143 01/17/20 0214 01/17/20 0545  BP: 134/84 120/80 110/79 120/78  Pulse: (!) 58 96 80 78  Resp: 18 18 18 18   Temp: 97.7 F (36.5 C) 98.4 F (36.9 C) 98.5 F (36.9 C) 98.3 F (36.8 C)  TempSrc: Oral Oral Oral Oral  SpO2: 100% 100% 100% 100%  Weight:      Height:        Intake/Output Summary (Last 24 hours) at 01/17/2020 0837 Last data filed at 01/17/2020 0200 Gross per 24 hour  Intake 1226.33 ml  Output 0 ml  Net 1226.33 ml   Filed Weights   01/14/20 0716  Weight: 52.2 kg    Physical Exam:  General exam: awake, alert, no acute distress HEENT: moist mucus membranes, hearing grossly normal  Respiratory system: CTAB, no wheezes, rales or rhonchi, normal respiratory effort. Cardiovascular system: normal S1/S2, RRR, no JVD, murmurs, rubs, gallops, no pedal edema.     Gastrointestinal system: soft, NT, ND, no HSM felt, +bowel sounds. Central nervous system: A&O x4. no gross focal neurologic deficits, normal speech Psychiatry: normal mood, congruent affect, judgement and insight appear normal  Labs   Data Reviewed: I have personally reviewed following labs and imaging studies  CBC: Recent Labs  Lab 01/14/20 0738 01/16/20 0424 01/17/20 0315  WBC 13.6* 5.0 4.3  HGB 15.4* 12.5 12.1  HCT 48.4* 38.3 37.1  MCV 103.4* 99.0 97.1  PLT 281 144* 502   Basic Metabolic Panel: Recent Labs  Lab 01/14/20 0731 01/14/20 1055 01/14/20 1836 01/14/20 2158 01/15/20 0229 01/16/20 0424 01/17/20 0315  NA 137   < > 139 140 137 132* 132*  K 5.0   < > 3.5 3.6 3.4* 3.3* 2.6*  CL 103   < > 111 110 108 98 98  CO2 9*   < > 17* 16* 18* 22 26  GLUCOSE 405*   < > 156* 133* 161* 301* 198*  BUN 21*   < > 17 16 16 12 9   CREATININE 1.24*   < > 0.75 0.70 0.72 0.61 0.57  CALCIUM 8.6*   < > 8.8* 9.0 8.9 8.4* 8.0*  MG 2.2  --   --   --   --  2.1 2.1   < > = values in this interval not displayed.   GFR: Estimated Creatinine Clearance: 82.4 mL/min (by C-G formula based on SCr of 0.57 mg/dL). Liver Function Tests: Recent Labs  Lab 01/14/20 0731  AST 21  ALT 34  ALKPHOS 114  BILITOT 2.0*  PROT 9.1*  ALBUMIN 4.9   Recent Labs  Lab 01/14/20 0731  LIPASE 19   No results for input(s): AMMONIA in the last 168 hours. Coagulation Profile: No results for input(s): INR, PROTIME in the last 168 hours. Cardiac Enzymes: No results for input(s): CKTOTAL, CKMB, CKMBINDEX, TROPONINI in the last 168 hours. BNP (last 3 results) No results for input(s): PROBNP in the last 8760 hours. HbA1C: Recent Labs    01/14/20 1055  HGBA1C 8.5*   CBG: Recent Labs  Lab 01/16/20 0809 01/16/20 1145 01/16/20 1617 01/16/20 2057 01/17/20 0818  GLUCAP 265* 201* 113* 184* 204*   Lipid Profile: No results for input(s): CHOL, HDL, LDLCALC, TRIG, CHOLHDL, LDLDIRECT in the last 72  hours. Thyroid Function Tests: No results for input(s): TSH, T4TOTAL, FREET4, T3FREE, THYROIDAB in the last 72 hours. Anemia Panel: No results for input(s): VITAMINB12, FOLATE, FERRITIN, TIBC, IRON, RETICCTPCT in the last 72 hours. Sepsis Labs: No results for input(s): PROCALCITON, LATICACIDVEN in the last 168 hours.  Recent Results (from the past 240 hour(s))  Urine culture     Status: None   Collection Time: 01/14/20  7:38 AM   Specimen: Urine, Clean Catch  Result Value Ref Range Status   Specimen Description   Final  URINE, CLEAN CATCH Performed at [facility], [address], [place], [place] [address]    Special Requests   Final    NONE Performed at [facility], [address], [place], [place] [address]    Culture   Final    NO GROWTH Performed at [facility], [address], [place], [place] [address]    Report Status 01/15/2020 FINAL  Final  SARS Coronavirus 2 by RT PCR (hospital order, performed in [facility] hospital lab) Nasopharyngeal Nasopharyngeal Swab     Status: None   Collection Time: 01/14/20  7:38 AM   Specimen: Nasopharyngeal Swab  Result Value Ref Range Status   SARS Coronavirus 2 NEGATIVE NEGATIVE Final    Comment: (NOTE) SARS-CoV-2 target nucleic acids are NOT DETECTED.  The SARS-CoV-2 RNA is generally detectable in upper and lower respiratory specimens during the acute phase of infection. The lowest concentration of SARS-CoV-2 viral copies this assay can detect is 250 copies / mL. A negative result does not preclude SARS-CoV-2 infection and should not be used as the sole basis for treatment or other patient management decisions.  A negative result may occur with improper specimen collection / handling, submission of specimen other than nasopharyngeal swab, presence of viral mutation(s) within the areas targeted by this assay, and inadequate number of viral copies (<250 copies / mL). A negative result  must be combined with clinical observations, patient history, and epidemiological information.  Fact Sheet for Patients:   BoilerBrush.com.cy  Fact Sheet for Healthcare Providers: https://pope.com/  This test is not yet approved or  cleared by the [place] FDA and has been authorized for detection and/or diagnosis of SARS-CoV-2 by FDA under an Emergency Use Authorization (EUA).  This EUA will remain in effect (meaning this test can be used) for the duration of the COVID-19 declaration under Section 564(b)(1) of the Act, 21 U.S.C. section 360bbb-3(b)(1), unless the authorization is terminated or revoked sooner.  Performed at [facility], [address] [place], [place] [address]   MRSA PCR Screening     Status: None   Collection Time: 01/14/20 11:46 AM   Specimen: Nasal Mucosa; Nasopharyngeal  Result Value Ref Range Status   MRSA by PCR NEGATIVE NEGATIVE Final    Comment:        The GeneXpert MRSA Assay (FDA approved for NASAL specimens only), is one component of a comprehensive MRSA colonization surveillance program. It is not intended to diagnose MRSA infection nor to guide or monitor treatment for MRSA infections. Performed at [facility], [address] [place], [place] [address]       Imaging Studies   No results found.   Medications   Scheduled Meds: . amLODipine  2.5 mg Oral Daily  . aspirin EC  81 mg Oral Daily  . carvedilol  3.125 mg Oral BID WC  . Chlorhexidine Gluconate Cloth  6 each Topical Daily  . enoxaparin (LOVENOX) injection  40 mg Subcutaneous Q24H  . insulin aspart  0-5 Units Subcutaneous QHS  . insulin aspart  0-9 Units Subcutaneous TID WC  . insulin glargine  30 Units Subcutaneous Daily  . mouth rinse  15 mL Mouth Rinse BID  . nicotine  14 mg Transdermal Daily  . pantoprazole  40 mg Oral BID AC  . potassium chloride  40 mEq Oral BID WC  . sodium  chloride flush  10-40 mL Intracatheter Q12H  . sucralfate  1 g Oral TID  WC & HS   Continuous Infusions: . sodium chloride 75 mL/hr at 01/17/20 0200  . potassium chloride         LOS: 3 days    Time spent: 25 minutes    Pennie Banter, DO [facility]  01/17/2020, 8:37 AM    If 7PM-7AM, please contact night-coverage. How to contact the [facility] Attending or Consulting provider 7A - 7P or covering provider during after hours 7P -7A, for this patient?    1. Check the care team in [facility] and look for a) attending/consulting [facility] provider listed and b) the [facility] team listed 2. Log into [email or web address] and use [place]'s universal password to access. If you do not have the password, please contact the hospital operator. 3. Locate the [facility] provider you are looking for under [facility] and page to a number that you can be directly reached. 4. If you still have difficulty reaching the provider, please page the [facility] (Director on Call) for the Hospitalists listed on amion for assistance.

## 2020-01-17 NOTE — Progress Notes (Signed)
CRITICAL VALUE ALERT  Critical Value:  Potassium 2.6  Date & Time Notied:  01/17/2020 at 0612  Provider Notified: will text page through [facility]  Orders Received/Actions taken: awaiting orders

## 2020-01-17 NOTE — Plan of Care (Signed)
  Problem: Fluid Volume: Goal: Ability to maintain a balanced intake and output will improve Outcome: Progressing   Problem: Metabolic: Goal: Ability to maintain appropriate glucose levels will improve Outcome: Progressing   Problem: Nutritional: Goal: Maintenance of adequate nutrition will improve Outcome: Progressing Goal: Progress toward achieving an optimal weight will improve Outcome: Progressing   Problem: Tissue Perfusion: Goal: Adequacy of tissue perfusion will improve Outcome: Progressing

## 2020-01-18 ENCOUNTER — Encounter (HOSPITAL_COMMUNITY): Payer: Self-pay | Admitting: *Deleted

## 2020-01-18 LAB — BASIC METABOLIC PANEL
Anion gap: 11 (ref 5–15)
BUN: 8 mg/dL (ref 6–20)
CO2: 26 mmol/L (ref 22–32)
Calcium: 8.3 mg/dL — ABNORMAL LOW (ref 8.9–10.3)
Chloride: 98 mmol/L (ref 98–111)
Creatinine, Ser: 0.47 mg/dL (ref 0.44–1.00)
GFR calc Af Amer: >60 mL/min (ref >60)
GFR calc non Af Amer: >60 mL/min (ref >60)
Glucose, Bld: 281 mg/dL — ABNORMAL HIGH (ref 70–99)
Potassium: 3.4 mmol/L — ABNORMAL LOW (ref 3.5–5.1)
Sodium: 135 mmol/L (ref 135–145)

## 2020-01-18 LAB — GLUCOSE, CAPILLARY
Glucose-Capillary: 286 mg/dL — ABNORMAL HIGH (ref 70–99)
Glucose-Capillary: 335 mg/dL — ABNORMAL HIGH (ref 70–99)

## 2020-01-18 LAB — MAGNESIUM: Magnesium: 2.2 mg/dL (ref 1.7–2.4)

## 2020-01-18 MED ORDER — POTASSIUM CHLORIDE 20 MEQ/15ML (10%) PO SOLN: 40.0000 meq | Freq: Every day | ORAL | 1 refills | Status: DC

## 2020-01-18 MED ORDER — PANTOPRAZOLE SODIUM 40 MG PO TBEC: 40.0000 mg | DELAYED_RELEASE_TABLET | Freq: Two times a day (BID) | ORAL | 1 refills | Status: DC

## 2020-01-18 MED ORDER — POTASSIUM CHLORIDE 20 MEQ PO PACK
40.0000 meq | PACK | Freq: Once | ORAL | Status: AC
  Administered 2020-01-18: 40 meq via ORAL
  Filled 2020-01-18: qty 2

## 2020-01-18 MED ORDER — INSULIN GLARGINE 100 UNIT/ML ~~LOC~~ SOLN: 34.0000 [IU] | Freq: Every day | SUBCUTANEOUS | 2 refills | Status: DC

## 2020-01-18 MED ORDER — INSULIN ASPART 100 UNIT/ML ~~LOC~~ SOLN: 0.0000 [IU] | Freq: Three times a day (TID) | SUBCUTANEOUS | 11 refills | Status: DC

## 2020-01-18 MED ORDER — PROMETHAZINE HCL 25 MG PO TABS
25.0000 mg | ORAL_TABLET | Freq: Four times a day (QID) | ORAL | 1 refills | Status: DC | PRN
Start: 2020-01-18 — End: 2020-02-05

## 2020-01-18 NOTE — Progress Notes (Signed)
Nurse reviewed discharge instructions with pt.  Pt verbalized understanding of discharge instructions, follow up appointments and new medications.  Pt given work note prior to discharge.  No concerns at time of discharge.

## 2020-01-18 NOTE — Discharge Summary (Addendum)
Physician Discharge Summary  [facility] SNK:[id number] DOB: [birth year] DOA: 01/14/2020  PCP: Patient, No Pcp Per  Admit date: 01/14/2020 Discharge date: 01/18/2020  Admitted From: home Disposition:  home  Recommendations for Outpatient Follow-up:  1. Follow up with PCP in 1-2 weeks 2. Please obtain BMP/CBC in one week 3. Please follow patient's potassium levels.  She had persistent hypokalemia, likely due to poor PO intake from nausea/vomiting.  She was discharged on daily potassium supplement until PCP follow up and repeat labs.  Home Health: No  Equipment/Devices: None   Discharge Condition: Stable  CODE STATUS: Full  Diet recommendation: Carb Modified     Discharge Diagnoses: Principal Problem:   DKA (diabetic ketoacidoses) (HCC) Active Problems:   AKI (acute kidney injury) (HCC)   Substance withdrawal (HCC)   Hypokalemia    Summary of HPI and Hospital Course:   34 year old female with history of DM 1, asthma, tobacco use, HTN, medical noncompliance admitted for nausea vomiting. Diagnosed with DKA. She admitted of noncompliance with her insulin regimen. Upon admission she was initially started on routine DKA protocol, transition to subcutaneous insulin.  Hypokalemia - likely due to patient poor p.o. intake and nausea vomiting.  Required fairly aggressive replacement.  Discharged on 40 mEq K daily until PCP follow-up and repeat labs.  Diabetic ketoacidosis, present on admission, resolved Diabetes mellitus type 1, insulin-dependent-poorly controlled A1c 8.5%.  Seen by diabetic coordinator.   Discharged on prior dose of Lantus 34 units daily, with sliding scale supplemental coverage with Novolog.    Nausea and vomiting - improved Patient has chronic issues with nausea/vomiting since childhood, was exacerbated in setting of DKA. Now tolerating diet well, but still with mild nausea at times.  Takes Phenergan at home, prescribed.  Increased PPI to twice daily.   Carafate used during admission, pt declined prescription, says Maalox or Mylanta work as well or better.   Acute kidney injury, resolved Admission creatinine 1.24, returned to baseline.  Repeat labs at PCP follow up.  Chronic systolic CHF - mildly reduced EF 50%. -Currently denies any chest pain. Supportive care. -Continue home medications.  Shortness of breath  Essential hypertension - continue on Norvasc and Coreg  GERD -PPI, increased to BID as above.   Discharge Instructions   Discharge Instructions    Call MD for:   Complete by: As directed    Uncontrolled blood sugar (high or low), excessive thirst, excessive urination, unable to keep down foods or drinks.   Call MD for:  extreme fatigue   Complete by: As directed    Call MD for:  persistant dizziness or light-headedness   Complete by: As directed    Call MD for:  persistant nausea and vomiting   Complete by: As directed    Call MD for:  severe uncontrolled pain   Complete by: As directed    Call MD for:  temperature >100.4   Complete by: As directed    Diet - low sodium heart healthy   Complete by: As directed    Increase activity slowly   Complete by: As directed      Allergies as of 01/18/2020   No Known Allergies     Medication List    TAKE these medications   albuterol 108 (90 Base) MCG/ACT inhaler Commonly known as: VENTOLIN HFA Inhale 2 puffs into the lungs every 6 (six) hours as needed for wheezing or shortness of breath.   amLODipine 2.5 MG tablet Commonly known as: NORVASC Take 1 tablet (2.5  mg total) by mouth daily.   aspirin 81 MG EC tablet Take 1 tablet (81 mg total) by mouth daily.   bismuth subsalicylate 262 MG/15ML suspension Commonly known as: PEPTO BISMOL Take 30 mLs by mouth every 6 (six) hours as needed for indigestion or diarrhea or loose stools.   carvedilol 3.125 MG tablet Commonly known as: COREG Take 1 tablet (3.125 mg total) by mouth 2 (two) times daily with a meal.    insulin aspart 100 UNIT/ML injection Commonly known as: novoLOG Inject 0-9 Units into the skin 3 (three) times daily with meals. By sliding scale What changed:   how much to take  additional instructions   insulin glargine 100 UNIT/ML injection Commonly known as: LANTUS Inject 0.34 mLs (34 Units total) into the skin daily.   nicotine 14 mg/24hr patch Commonly known as: NICODERM CQ - dosed in mg/24 hours Place 1 patch (14 mg total) onto the skin daily.   OVER THE COUNTER MEDICATION Take 10 g by mouth daily. Kratom (Used for withdrawal/cease cravings)   pantoprazole 40 MG tablet Commonly known as: PROTONIX Take 1 tablet (40 mg total) by mouth 2 (two) times daily before a meal. What changed: when to take this   polyethylene glycol 17 g packet Commonly known as: MIRALAX / GLYCOLAX Take 17 g by mouth daily as needed for moderate constipation.   potassium chloride 20 MEQ/15ML (10%) Soln Take 30 mLs (40 mEq total) by mouth daily.   promethazine 25 MG tablet Commonly known as: PHENERGAN Take 1 tablet (25 mg total) by mouth every 6 (six) hours as needed for nausea or vomiting. What changed:   medication strength  how much to take       Follow-up Information    [facility] Follow up on 01/29/2020.   Why: go 15 mins before time of appointment in order to complete information. appointment time is 1110am Contact information: [address] [place] [address] [phone]             No Known Allergies  Consultations:  Diabetes coordinator    Procedures/Studies: CT Renal Stone Study  Result Date: 01/14/2020 CLINICAL DATA:  Right flank pain for the past 2 days. History of kidney stones. EXAM: CT ABDOMEN AND PELVIS WITHOUT CONTRAST TECHNIQUE: Multidetector CT imaging of the abdomen and pelvis was performed following the standard protocol without IV contrast. COMPARISON:  CT abdomen pelvis dated November 24, 2019.  FINDINGS: Lower chest: No acute abnormality. Hepatobiliary: 7 mm low-density lesion in the anterior left hepatic lobe is too small to characterize. Unchanged diffuse hepatic steatosis with more focal along the falciform ligament. The gallbladder is unremarkable. No biliary dilatation. Pancreas: Unremarkable. No pancreatic ductal dilatation or surrounding inflammatory changes. Spleen: Normal in size without focal abnormality. Adrenals/Urinary Tract: Adrenal glands are unremarkable. Kidneys are normal, without renal calculi, focal lesion, or hydronephrosis. Bladder is unremarkable. Stomach/Bowel: Stomach is within normal limits. Appendix appears normal. No evidence of bowel wall thickening, distention, or inflammatory changes. Vascular/Lymphatic: No significant vascular findings are present. No enlarged abdominal or pelvic lymph nodes. Reproductive: Uterus and bilateral adnexa are unremarkable. Other: No abdominal wall hernia or abnormality. No abdominopelvic ascites. Resolved pelvic hematoma. No pneumoperitoneum. Musculoskeletal: No acute or significant osseous findings. IMPRESSION: 1. No acute intra-abdominal process. No urolithiasis. 2. Unchanged hepatic steatosis. 3. Resolved pelvic hematoma. Electronically Signed   By: Obie Dredge M.D.   On: 01/14/2020 09:15       Subjective: Patient seen during  breakfast morning.  She reports some mild nausea but no vomiting.  Says she has required nausea medication on and off since she was about 34 years old.  Says it gets worse when glucose is uncontrolled.  Reports feeling much better.  Agreeable with discharge home and PCP follow-up.   Discharge Exam: Vitals:   01/18/20 0132 01/18/20 0602  BP: (!) 130/96 125/88  Pulse: 79 80  Resp: 16 17  Temp: (!) 97.4 F (36.3 C) 97.8 F (36.6 C)  SpO2: 100% 100%   Vitals:   01/17/20 1446 01/17/20 2057 01/18/20 0132 01/18/20 0602  BP: (!) 137/96 (!) 144/103 (!) 130/96 125/88  Pulse: 92 84 79 80  Resp: 18 16 16 17    Temp: 97.9 F (36.6 C) 98.3 F (36.8 C) (!) 97.4 F (36.3 C) 97.8 F (36.6 C)  TempSrc:  Oral Oral Oral  SpO2: 100% 100% 100% 100%  Weight:      Height:        General: Pt is alert, awake, not in acute distress Cardiovascular: RRR, S1/S2 +, no rubs, no gallops Respiratory: CTA bilaterally, no wheezing, no rhonchi Abdominal: Soft, NT, ND, bowel sounds + Extremities: no edema, no cyanosis    The results of significant diagnostics from this hospitalization (including imaging, microbiology, ancillary and laboratory) are listed below for reference.     Microbiology: Recent Results (from the past 240 hour(s))  Urine culture     Status: None   Collection Time: 01/14/20  7:38 AM   Specimen: Urine, Clean Catch  Result Value Ref Range Status   Specimen Description   Final    URINE, CLEAN CATCH Performed at [facility], [place] [address], [place], [place] [address]    Special Requests   Final    NONE Performed at [facility], [place] [address] [place], [place] [address]    Culture   Final    NO GROWTH Performed at [facility], [place] [address], [place], [place] [address]    Report Status 01/15/2020 FINAL  Final  SARS Coronavirus 2 by RT PCR (hospital order, performed in [facility] hospital lab) Nasopharyngeal Nasopharyngeal Swab     Status: None   Collection Time: 01/14/20  7:38 AM   Specimen: Nasopharyngeal Swab  Result Value Ref Range Status   SARS Coronavirus 2 NEGATIVE NEGATIVE Final    Comment: (NOTE) SARS-CoV-2 target nucleic acids are NOT DETECTED.  The SARS-CoV-2 RNA is generally detectable in upper and lower respiratory specimens during the acute phase of infection. The lowest concentration of SARS-CoV-2 viral copies this assay can detect is 250 copies / mL. A negative result does not preclude SARS-CoV-2 infection and should not be used as the sole basis for treatment or other patient management decisions.  A negative  result may occur with improper specimen collection / handling, submission of specimen other than nasopharyngeal swab, presence of viral mutation(s) within the areas targeted by this assay, and inadequate number of viral copies (<250 copies / mL). A negative result must be combined with clinical observations, patient history, and epidemiological information.  Fact Sheet for Patients:   StrictlyIdeas.no  Fact Sheet for Healthcare Providers: BankingDealers.co.za  This test is not yet approved or  cleared by the [place] FDA and has been authorized for detection and/or diagnosis of SARS-CoV-2 by FDA under an Emergency Use Authorization (EUA).  This EUA will remain in effect (meaning this test can be used) for the duration of the COVID-19 declaration under  Section 564(b)(1) of the Act, 21 U.S.C. section 360bbb-3(b)(1), unless the authorization is terminated or revoked sooner.  Performed at [facility], [address] [place], [place] [address]   MRSA PCR Screening     Status: None   Collection Time: 01/14/20 11:46 AM   Specimen: Nasal Mucosa; Nasopharyngeal  Result Value Ref Range Status   MRSA by PCR NEGATIVE NEGATIVE Final    Comment:        The GeneXpert MRSA Assay (FDA approved for NASAL specimens only), is one component of a comprehensive MRSA colonization surveillance program. It is not intended to diagnose MRSA infection nor to guide or monitor treatment for MRSA infections. Performed at [facility], [address], [place], [place] [address]      Labs: BNP (last 3 results) No results for input(s): BNP in the last 8760 hours. Basic Metabolic Panel: Recent Labs  Lab 01/14/20 0731 01/14/20 1055 01/14/20 2158 01/15/20 0229 01/16/20 0424 01/17/20 0315 01/18/20 0312  NA 137   < > 140 137 132* 132* 135  K 5.0   < > 3.6 3.4* 3.3* 2.6* 3.4*  CL 103   < > 110 108 98 98 98   CO2 9*   < > 16* 18* 22 26 26   GLUCOSE 405*   < > 133* 161* 301* 198* 281*  BUN 21*   < > 16 16 12 9 8   CREATININE 1.24*   < > 0.70 0.72 0.61 0.57 0.47  CALCIUM 8.6*   < > 9.0 8.9 8.4* 8.0* 8.3*  MG 2.2  --   --   --  2.1 2.1 2.2   < > = values in this interval not displayed.   Liver Function Tests: Recent Labs  Lab 01/14/20 0731  AST 21  ALT 34  ALKPHOS 114  BILITOT 2.0*  PROT 9.1*  ALBUMIN 4.9   Recent Labs  Lab 01/14/20 0731  LIPASE 19   No results for input(s): AMMONIA in the last 168 hours. CBC: Recent Labs  Lab 01/14/20 0738 01/16/20 0424 01/17/20 0315  WBC 13.6* 5.0 4.3  HGB 15.4* 12.5 12.1  HCT 48.4* 38.3 37.1  MCV 103.4* 99.0 97.1  PLT 281 144* 152   Cardiac Enzymes: No results for input(s): CKTOTAL, CKMB, CKMBINDEX, TROPONINI in the last 168 hours. BNP: Invalid input(s): POCBNP CBG: Recent Labs  Lab 01/16/20 2057 01/17/20 0818 01/17/20 1222 01/17/20 1636 01/17/20 2151  GLUCAP 184* 204* 284* 113* 179*   D-Dimer No results for input(s): DDIMER in the last 72 hours. Hgb A1c No results for input(s): HGBA1C in the last 72 hours. Lipid Profile No results for input(s): CHOL, HDL, LDLCALC, TRIG, CHOLHDL, LDLDIRECT in the last 72 hours. Thyroid function studies No results for input(s): TSH, T4TOTAL, T3FREE, THYROIDAB in the last 72 hours.  Invalid input(s): FREET3 Anemia work up No results for input(s): VITAMINB12, FOLATE, FERRITIN, TIBC, IRON, RETICCTPCT in the last 72 hours. Urinalysis    Component Value Date/Time   COLORURINE STRAW (A) 01/14/2020 0738   APPEARANCEUR CLEAR 01/14/2020 0738   LABSPEC 1.023 01/14/2020 0738   PHURINE 5.0 01/14/2020 0738   GLUCOSEU >=500 (A) 01/14/2020 0738   HGBUR SMALL (A) 01/14/2020 0738   BILIRUBINUR NEGATIVE 01/14/2020 0738   KETONESUR 80 (A) 01/14/2020 0738   PROTEINUR 100 (A) 01/14/2020 0738   NITRITE NEGATIVE 01/14/2020 0738   LEUKOCYTESUR NEGATIVE 01/14/2020 0738   Sepsis Labs Invalid input(s):  PROCALCITONIN,  WBC,  LACTICIDVEN Microbiology Recent Results (from the  past 240 hour(s))  Urine culture     Status: None   Collection Time: 01/14/20  7:38 AM   Specimen: Urine, Clean Catch  Result Value Ref Range Status   Specimen Description   Final    URINE, CLEAN CATCH Performed at [facility], [address] [place], [place] [address]    Special Requests   Final    NONE Performed at [facility], [address], [place], [place] [address]    Culture   Final    NO GROWTH Performed at [facility], [address], [place], [place] [address]    Report Status 01/15/2020 FINAL  Final  SARS Coronavirus 2 by RT PCR (hospital order, performed in [facility] hospital lab) Nasopharyngeal Nasopharyngeal Swab     Status: None   Collection Time: 01/14/20  7:38 AM   Specimen: Nasopharyngeal Swab  Result Value Ref Range Status   SARS Coronavirus 2 NEGATIVE NEGATIVE Final    Comment: (NOTE) SARS-CoV-2 target nucleic acids are NOT DETECTED.  The SARS-CoV-2 RNA is generally detectable in upper and lower respiratory specimens during the acute phase of infection. The lowest concentration of SARS-CoV-2 viral copies this assay can detect is 250 copies / mL. A negative result does not preclude SARS-CoV-2 infection and should not be used as the sole basis for treatment or other patient management decisions.  A negative result may occur with improper specimen collection / handling, submission of specimen other than nasopharyngeal swab, presence of viral mutation(s) within the areas targeted by this assay, and inadequate number of viral copies (<250 copies / mL). A negative result must be combined with clinical observations, patient history, and epidemiological information.  Fact Sheet for Patients:   BoilerBrush.com.cy  Fact Sheet for Healthcare Providers: https://pope.com/  This test is not yet  approved or  cleared by the [place] FDA and has been authorized for detection and/or diagnosis of SARS-CoV-2 by FDA under an Emergency Use Authorization (EUA).  This EUA will remain in effect (meaning this test can be used) for the duration of the COVID-19 declaration under Section 564(b)(1) of the Act, 21 U.S.C. section 360bbb-3(b)(1), unless the authorization is terminated or revoked sooner.  Performed at [facility], [address] [place], [place] [address]   MRSA PCR Screening     Status: None   Collection Time: 01/14/20 11:46 AM   Specimen: Nasal Mucosa; Nasopharyngeal  Result Value Ref Range Status   MRSA by PCR NEGATIVE NEGATIVE Final    Comment:        The GeneXpert MRSA Assay (FDA approved for NASAL specimens only), is one component of a comprehensive MRSA colonization surveillance program. It is not intended to diagnose MRSA infection nor to guide or monitor treatment for MRSA infections. Performed at [facility], [address], [place], [place] [address]      Time coordinating discharge: Over 30 minutes  SIGNED:   Pennie Banter, DO [facility] 01/18/2020, 9:41 AM   If 7PM-7AM, please contact night-coverage [email or web address]

## 2020-01-19 ENCOUNTER — Encounter (HOSPITAL_COMMUNITY): Payer: Self-pay | Admitting: Cardiology

## 2020-01-19 ENCOUNTER — Other Ambulatory Visit (HOSPITAL_COMMUNITY): Payer: Self-pay

## 2020-01-19 NOTE — Progress Notes (Unsigned)
Patient ID: Madison Williams, female   DOB: 13-May-1986, 34 y.o.   MRN: [id number]  Verified appointment "no show" status with Mikle Bosworth at 1:15.

## 2020-01-22 ENCOUNTER — Encounter (HOSPITAL_COMMUNITY): Payer: Self-pay | Admitting: Cardiovascular Disease

## 2020-02-03 ENCOUNTER — Telehealth (HOSPITAL_COMMUNITY): Payer: Self-pay | Admitting: Cardiovascular Disease

## 2020-02-03 NOTE — Telephone Encounter (Signed)
Just an FYI. We have made several attempts to contact this patient including sending a letter to schedule or reschedule their echocardiogram. We will be removing the patient from the echo WQ.    Mailed letter  01/22/20 called and VM is full unable to leave message @ 9:17/LBW  01/19/2020 Called and VM is full and unable to LVM @ 1:52/LBW  01/19/20 Pt No Showed echo     Thank you

## 2020-02-04 NOTE — Progress Notes (Signed)
Patient ID: Madison Williams, female   DOB: 05-Jan-1986, 34 y.o.   MRN: [id number]     Madison Williams, is a 34 y.o. female  BMW:[id number]  UVO:[id number]  DOB - February 27, 1986  Subjective:  Chief Complaint and HPI: Madison Williams is a 34 y.o. female here today to establish care and for a follow up visit After hospitalization 6/16-6/20/2021 for DKA had N/V.  Vomiting has resolved but she has had frequent nausea "for years."  Promethazine helps.  She was diagnosed with DM1 about 3 years ago.  She says she has felt the best on lantus and SS but ran out about 3 days ago.  About 2 hours prior to this visit she used some old 70/30 she had on hand(blood sugar=330 now 2 hrs post 70/30).  In hospital, her A1c=8.5. she had been taking lantus 15-20 units daily and SS up until that hospitalization.  She says she was never taking 34 units lantus which is what was on her discharge summary.  She has had gaps in care due to poor/no insurance coverage.  Checks blood sugars 4-5 times daily.  Did not bring blood sugar log with her.    From discharge summary: Discharge Diagnoses: Principal Problem:   DKA (diabetic ketoacidoses) (HCC) Active Problems:   AKI (acute kidney injury) (HCC)   Substance withdrawal (HCC)   Hypokalemia    Summary of HPI and Hospital Course:   34 year old female with history of DM 1, asthma, tobacco use, HTN, medical noncompliance admitted for nausea vomiting. Diagnosed with DKA. She admitted of noncompliance with her insulin regimen. Upon admission she was initially started on routine DKA protocol, transition to subcutaneous insulin.  Hypokalemia- likely due to patient poor p.o. intakeandnausea vomiting. Required fairly aggressive replacement.  Discharged on 40 mEq K daily until PCP follow-up and repeat labs.  Diabetic ketoacidosis, present on admission, resolved Diabetes mellitus type 1, insulin-dependent-poorly controlled A1c 8.5%.  Seen by diabetic coordinator.    Discharged on prior dose of Lantus34units daily, with sliding scalesupplemental coverage with Novolog.    Nausea and vomiting- improved Patient has chronic issues with nausea/vomiting since childhood, was exacerbated in setting of DKA. Now tolerating diet well, but still with mild nausea at times.  Takes Phenergan at home, prescribed.  Increased PPI to twice daily.  Carafate used during admission, pt declined prescription, says Maalox or Mylanta work as well or better.   Acute kidney injury, resolved Admission creatinine 1.24, returned to baseline.  Repeat labs at PCP follow up.  Chronic systolic CHF - mildly reduced EF 50%. -Currently denies any chest pain. Supportive care. -Continue home medications.  Shortness of breath  Essential hypertension - continue on Norvasc and Coreg  GERD -PPI, increased to BID as above   ED/Hospital notes reviewed.    ROS:   Constitutional:  No f/c, No night sweats, No unexplained weight loss. EENT:  No vision changes, No blurry vision, No hearing changes. No mouth, throat, or ear problems.  Respiratory: No cough, No SOB Cardiac: No CP, no palpitations GI:  No abd pain, No N/V/D. GU: No Urinary s/sx Musculoskeletal: No joint pain Neuro: No headache, no dizziness, no motor weakness.  Skin: No rash Endocrine:  No polydipsia. No polyuria.  Psych: Denies SI/HI  No problems updated.  ALLERGIES: No Known Allergies  PAST MEDICAL HISTORY: Past Medical History:  Diagnosis Date  . Asthma   . Diabetes mellitus without complication (HCC)   . Rheumatoid arthritis (HCC)     MEDICATIONS AT HOME: Prior to  Admission medications   Medication Sig Start Date End Date Taking? Authorizing Provider  albuterol (VENTOLIN HFA) 108 (90 Base) MCG/ACT inhaler Inhale 2 puffs into the lungs every 6 (six) hours as needed for wheezing or shortness of breath. 02/05/20  Yes Georgian Co M, PA-C  amLODipine (NORVASC) 2.5 MG tablet Take 1 tablet (2.5 mg total)  by mouth daily. 02/05/20  Yes Anders Simmonds, PA-C  aspirin EC 81 MG EC tablet Take 1 tablet (81 mg total) by mouth daily. 11/29/19  Yes Mikhail, Nita Sells, DO  bismuth subsalicylate (PEPTO BISMOL) 262 MG/15ML suspension Take 30 mLs by mouth every 6 (six) hours as needed for indigestion or diarrhea or loose stools.   Yes [provider]  carvedilol (COREG) 3.125 MG tablet Take 1 tablet (3.125 mg total) by mouth 2 (two) times daily with a meal. 11/29/19  Yes Mikhail, Maryann, DO  insulin aspart (NOVOLOG) 100 UNIT/ML injection Inject 0-9 Units into the skin 3 (three) times daily with meals. By sliding scale 02/05/20  Yes Anders Simmonds, PA-C  insulin glargine (LANTUS) 100 UNIT/ML injection Inject 0.2 mLs (20 Units total) into the skin daily. 02/05/20  Yes Cap Massi, Marzella Schlein, PA-C  OVER THE COUNTER MEDICATION Take 10 g by mouth daily. Kratom (Used for withdrawal/cease cravings)   Yes [provider]  pantoprazole (PROTONIX) 40 MG tablet Take 1 tablet (40 mg total) by mouth 2 (two) times daily before a meal. 02/05/20  Yes Dovber Ernest M, PA-C  polyethylene glycol (MIRALAX / GLYCOLAX) 17 g packet Take 17 g by mouth daily as needed for moderate constipation. 11/29/19  Yes Mikhail, Maryann, DO  potassium chloride 20 MEQ/15ML (10%) SOLN Take 30 mLs (40 mEq total) by mouth daily. 01/18/20  Yes Pennie Banter, DO  promethazine (PHENERGAN) 25 MG tablet Take 1 tablet (25 mg total) by mouth every 6 (six) hours as needed for nausea or vomiting. 02/05/20  Yes Georgian Co M, PA-C  Insulin Syringes, Disposable, U-100 0.5 ML MISC 1 each by Does not apply route 4 (four) times daily - after meals and at bedtime. 02/05/20   Anders Simmonds, PA-C  nicotine (NICODERM CQ - DOSED IN MG/24 HOURS) 14 mg/24hr patch Place 1 patch (14 mg total) onto the skin daily. Patient not taking: Reported on 01/14/2020 11/29/19   Edsel Petrin, DO     Objective:  EXAM:   Vitals:   02/05/20 1122  BP: 115/73  Pulse: (!)  109  Temp: 97.7 F (36.5 C)  TempSrc: Temporal  SpO2: 98%  Weight: 104 lb (47.2 kg)    General appearance : A&OX3. NAD. Non-toxic-appearing;  Very thin HEENT: Atraumatic and Normocephalic.  PERRLA. EOM intact.  Neck: supple, no JVD. No cervical lymphadenopathy. No thyromegaly Chest/Lungs:  Breathing-non-labored, Good air entry bilaterally, breath sounds normal without rales, rhonchi, or wheezing  CVS: S1 S2 regular, no murmurs, gallops, rubs rate 96 on exam Extremities: Bilateral Lower Ext shows no edema, both legs are warm to touch with = pulse throughout Neurology:  CN II-XII grossly intact, Non focal.   Psych:  TP linear. J/I WNL. Normal speech. Appropriate eye contact and affect.  Skin:  No Rash  Data Review Lab Results  Component Value Date   HGBA1C 8.5 (H) 01/14/2020   HGBA1C 10.8 (H) 10/17/2019   HGBA1C 11.1 (H) 04/02/2019     Assessment & Plan   1. Type 1 diabetes mellitus with hyperglycemia (HCC) Uncontrolled.  PTA she was taking 15-20 of lantus and SS with A1C  and A1C=8.5.  I will have her take 20 units lantus consistently and SS and keep record of glucose and f/up with Franky Macho for close f/up and titration.  Encouraged her to apply for financial assistance as well - Glucose (CBG) - insulin aspart (NOVOLOG) 100 UNIT/ML injection; Inject 0-9 Units into the skin 3 (three) times daily with meals. By sliding scale  Dispense: 10 mL; Refill: 11 - insulin glargine (LANTUS) 100 UNIT/ML injection; Inject 0.2 mLs (20 Units total) into the skin daily.  Dispense: 10 mL; Refill: 2 - amLODipine (NORVASC) 2.5 MG tablet; Take 1 tablet (2.5 mg total) by mouth daily.  Dispense: 30 tablet; Refill: 0 - Insulin Syringes, Disposable, U-100 0.5 ML MISC; 1 each by Does not apply route 4 (four) times daily - after meals and at bedtime.  Dispense: 100 each; Refill: 5 - Comprehensive metabolic panel - Lipid panel  2. Nausea and vomiting, intractability of vomiting not specified, unspecified vomiting  type - pantoprazole (PROTONIX) 40 MG tablet; Take 1 tablet (40 mg total) by mouth 2 (two) times daily before a meal.  Dispense: 60 tablet; Refill: 1 - promethazine (PHENERGAN) 25 MG tablet; Take 1 tablet (25 mg total) by mouth every 6 (six) hours as needed for nausea or vomiting.  Dispense: 30 tablet; Refill: 2 - Comprehensive metabolic panel  3. Hypokalemia - Comprehensive metabolic panel  4. Hospital discharge follow-up  5. Mild intermittent asthma without complication - albuterol (VENTOLIN HFA) 108 (90 Base) MCG/ACT inhaler; Inhale 2 puffs into the lungs every 6 (six) hours as needed for wheezing or shortness of breath.  Dispense: 18 g; Refill: 1   Patient have been counseled extensively about nutrition and exercise  Return for 3 weeks with Franky Macho for televisit and insulin titration;  8 weeks assign PCP.  The patient was given clear instructions to go to ER or return to medical center if symptoms don't improve, worsen or new problems develop. The patient verbalized understanding. The patient was told to call to get lab results if they haven't heard anything in the next week.     Georgian Co, PA-C [facility] and [facility], [place] [phone]   02/05/2020, 11:53 AM

## 2020-02-05 ENCOUNTER — Ambulatory Visit: Payer: Self-pay | Attending: Physician Assistant | Admitting: Physician Assistant

## 2020-02-05 ENCOUNTER — Other Ambulatory Visit: Payer: Self-pay

## 2020-02-05 ENCOUNTER — Encounter: Payer: Self-pay | Admitting: Physician Assistant

## 2020-02-05 ENCOUNTER — Other Ambulatory Visit: Payer: Self-pay | Admitting: Physician Assistant

## 2020-02-05 VITALS — BP 115/73 | HR 109 | Temp 97.7°F | Wt 104.0 lb

## 2020-02-05 DIAGNOSIS — J4520 Mild intermittent asthma, uncomplicated: Secondary | ICD-10-CM

## 2020-02-05 DIAGNOSIS — Z09 Encounter for follow-up examination after completed treatment for conditions other than malignant neoplasm: Secondary | ICD-10-CM

## 2020-02-05 DIAGNOSIS — E1065 Type 1 diabetes mellitus with hyperglycemia: Secondary | ICD-10-CM

## 2020-02-05 DIAGNOSIS — R112 Nausea with vomiting, unspecified: Secondary | ICD-10-CM

## 2020-02-05 DIAGNOSIS — E876 Hypokalemia: Secondary | ICD-10-CM

## 2020-02-05 LAB — GLUCOSE, POCT (MANUAL RESULT ENTRY): POC Glucose: 330 mg/dl — AB (ref 70–99)

## 2020-02-05 MED ORDER — AMLODIPINE BESYLATE 2.5 MG PO TABS: 2.5000 mg | ORAL_TABLET | Freq: Every day | ORAL | 0 refills | Status: DC

## 2020-02-05 MED ORDER — INSULIN SYRINGES (DISPOSABLE) U-100 0.5 ML MISC: 1.0000 | Freq: Three times a day (TID) | 5 refills | Status: DC

## 2020-02-05 MED ORDER — INSULIN GLARGINE 100 UNIT/ML ~~LOC~~ SOLN: 20.0000 [IU] | Freq: Every day | SUBCUTANEOUS | 2 refills | Status: DC

## 2020-02-05 MED ORDER — PROMETHAZINE HCL 25 MG PO TABS: 25.0000 mg | ORAL_TABLET | Freq: Four times a day (QID) | ORAL | 2 refills | Status: DC | PRN

## 2020-02-05 MED ORDER — INSULIN ASPART 100 UNIT/ML ~~LOC~~ SOLN: 0.0000 [IU] | Freq: Three times a day (TID) | SUBCUTANEOUS | 11 refills | Status: DC

## 2020-02-05 MED ORDER — PANTOPRAZOLE SODIUM 40 MG PO TBEC: 40.0000 mg | DELAYED_RELEASE_TABLET | Freq: Two times a day (BID) | ORAL | 1 refills | Status: DC

## 2020-02-05 MED ORDER — ALBUTEROL SULFATE HFA 108 (90 BASE) MCG/ACT IN AERS: 2.0000 | INHALATION_SPRAY | Freq: Four times a day (QID) | RESPIRATORY_TRACT | 1 refills | Status: DC | PRN

## 2020-02-05 MED FILL — !LANTUS 100 UNITS/ML VIAL: 100 | 28 days supply | Qty: 10 | Fill #0

## 2020-02-05 MED FILL — ?HUMALOG 100 UNITS/ML VIAL: 100 | 28 days supply | Qty: 10 | Fill #0

## 2020-02-05 MED FILL — PROMETHAZINE 25 MG TABLET: 25 | 7 days supply | Qty: 30 | Fill #0

## 2020-02-06 LAB — COMPREHENSIVE METABOLIC PANEL
ALT: 123 IU/L — ABNORMAL HIGH (ref 0–32)
AST: 133 IU/L — ABNORMAL HIGH (ref 0–40)
Albumin/Globulin Ratio: 2 (ref 1.2–2.2)
Albumin: 4.6 g/dL (ref 3.8–4.8)
Alkaline Phosphatase: 110 IU/L (ref 48–121)
BUN/Creatinine Ratio: 17 (ref 9–23)
BUN: 11 mg/dL (ref 6–20)
Bilirubin Total: 0.3 mg/dL (ref 0.0–1.2)
CO2: 20 mmol/L (ref 20–29)
Calcium: 8.6 mg/dL — ABNORMAL LOW (ref 8.7–10.2)
Chloride: 84 mmol/L — ABNORMAL LOW (ref 96–106)
Creatinine, Ser: 0.63 mg/dL (ref 0.57–1.00)
GFR calc Af Amer: 136 mL/min/{1.73_m2} (ref >59)
GFR calc non Af Amer: 118 mL/min/{1.73_m2} (ref >59)
Globulin, Total: 2.3 g/dL (ref 1.5–4.5)
Glucose: 313 mg/dL — ABNORMAL HIGH (ref 65–99)
Potassium: 3.1 mmol/L — ABNORMAL LOW (ref 3.5–5.2)
Sodium: 130 mmol/L — ABNORMAL LOW (ref 134–144)
Total Protein: 6.9 g/dL (ref 6.0–8.5)

## 2020-02-06 LAB — LIPID PANEL
Chol/HDL Ratio: 2 ratio (ref 0.0–4.4)
Cholesterol, Total: 275 mg/dL — ABNORMAL HIGH (ref 100–199)
HDL: 138 mg/dL (ref >39)
LDL Chol Calc (NIH): 126 mg/dL — ABNORMAL HIGH (ref 0–99)
Triglycerides: 70 mg/dL (ref 0–149)
VLDL Cholesterol Cal: 11 mg/dL (ref 5–40)

## 2020-02-11 ENCOUNTER — Other Ambulatory Visit: Payer: Self-pay | Admitting: Physician Assistant

## 2020-02-11 DIAGNOSIS — E871 Hypo-osmolality and hyponatremia: Secondary | ICD-10-CM

## 2020-02-11 DIAGNOSIS — R7989 Other specified abnormal findings of blood chemistry: Secondary | ICD-10-CM

## 2020-02-11 DIAGNOSIS — E876 Hypokalemia: Secondary | ICD-10-CM

## 2020-02-26 ENCOUNTER — Ambulatory Visit: Payer: Self-pay | Attending: Family Medicine | Admitting: Pharmacist

## 2020-02-26 ENCOUNTER — Other Ambulatory Visit: Payer: Self-pay

## 2020-02-26 DIAGNOSIS — E1065 Type 1 diabetes mellitus with hyperglycemia: Secondary | ICD-10-CM

## 2020-02-26 NOTE — Progress Notes (Signed)
S:    PCP: Dr. Alvis Lemmings   No chief complaint on file.   Virtual Visit via Telephone Note Due to current restrictions/limitations of in-office visits due to the COVID-19 pandemic, this scheduled clinical appointment was converted to a telehealth visit  I connected withDevon Williams on 02/26/20 at 1:30 pm by telephoneand verified that I am speaking with the correct person using two identifiers. I am in my office.  The patient is at home.  Only the patient and myself participated in this encounter.  I discussed the limitations, risks, security and privacy concerns of performing an evaluation and management service by telephone and the availability of in person appointments. I also discussed with the patient that there may be a patient responsible charge related to this service. The patient expressed understanding and agreed to proceed.        Patient in good spirits.  Presents for diabetes evaluation, education, and management. PCP not assigned. Patient was referred on 02/05/2020 by Georgian Co.   Patient reports Diabetes was diagnosed ~5 years ago.   Family/Social History:  - FHx: no pertinent positives  - Tobacco: E-cig ("5%")  - Alcohol: rarely   Insurance coverage/medication affordability: self-pay   Medication adherence reported.   Current diabetes medications include: Lantus 20 units daily (reports using 20-25u/daily); Novolog 0-9 units per sliding scale (most commonly 3-5 units TID)  Patient reports 1 hypoglycemic event. Attributes this to not eating enough with meals.   Patient reported dietary habits: Eats at least 3 meals/day - Endorses carb counting  - Limits sweets   Patient-reported exercise habits:  - rarely    Patient denies nocturia (nighttime urination).  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 Endorses numbness on the tops of her feet.  Patient reports visual changes. Patient reports self foot exams.    O:  - Checked at 8AM: 152    Lab Results  Component Value Date   HGBA1C 8.5 (H) 01/14/2020   There were no vitals filed for this visit.  Lipid Panel     Component Value Date/Time   CHOL 275 (H) 02/05/2020 1152   TRIG 70 02/05/2020 1152   HDL 138 02/05/2020 1152   CHOLHDL 2.0 02/05/2020 1152   CHOLHDL 2.1 11/25/2019 0920   VLDL 16 11/25/2019 0920   LDLCALC 126 (H) 02/05/2020 1152   Home fasting blood sugars: 90-170   Pre meal: 150-170 1 hour post-prandial: reports mostly 170-200 (reports highest has been 250)  Clinical Atherosclerotic Cardiovascular Disease (ASCVD): Yes  The ASCVD Risk score Denman George DC Jr., et al., 2013) failed to calculate for the following reasons:   The 2013 ASCVD risk score is only valid for ages 71 to 42   The patient has a prior MI or stroke diagnosis    A/P: Diabetes longstanding currently uncontrolled. Patient is able to verbalize appropriate hypoglycemia management plan. Medication adherence reported. -Increased dose of Lantus to 25 units daily. -Continue current sliding scale/correction factor mealtime insulin for now.  -Extensively discussed pathophysiology of diabetes, recommended lifestyle interventions, dietary effects on blood sugar control -Counseled on s/sx of and management of hypoglycemia -Next A1C anticipated 03/2020.   ASCVD risk - secondary prevention in patient with diabetes. Last LDL is not controlled. Needs Cardiologist - applying for [facility] Card to get Cardiology referral. Recent LFT elevation noted on CMP - will need r/p level before starting statin.   Written patient instructions provided.  Total time counseling 15 minutes.   Follow up PCP Clinic Visit 04/06/2020.  Benard Halsted, PharmD, [place] [phone]

## 2020-02-27 ENCOUNTER — Encounter: Payer: Self-pay | Admitting: Pharmacist

## 2020-04-06 ENCOUNTER — Other Ambulatory Visit: Payer: Self-pay | Admitting: Family Medicine

## 2020-04-06 ENCOUNTER — Encounter: Payer: Self-pay | Admitting: Family Medicine

## 2020-04-06 ENCOUNTER — Ambulatory Visit: Payer: Medicaid Other | Attending: Family Medicine | Admitting: Family Medicine

## 2020-04-06 ENCOUNTER — Other Ambulatory Visit: Payer: Self-pay

## 2020-04-06 VITALS — BP 95/60 | HR 101 | Ht 67.0 in | Wt 102.0 lb

## 2020-04-06 DIAGNOSIS — I10 Essential (primary) hypertension: Secondary | ICD-10-CM | POA: Diagnosis not present

## 2020-04-06 DIAGNOSIS — E1065 Type 1 diabetes mellitus with hyperglycemia: Secondary | ICD-10-CM | POA: Insufficient documentation

## 2020-04-06 DIAGNOSIS — Z79899 Other long term (current) drug therapy: Secondary | ICD-10-CM | POA: Diagnosis not present

## 2020-04-06 DIAGNOSIS — J45909 Unspecified asthma, uncomplicated: Secondary | ICD-10-CM | POA: Diagnosis not present

## 2020-04-06 DIAGNOSIS — Z794 Long term (current) use of insulin: Secondary | ICD-10-CM | POA: Insufficient documentation

## 2020-04-06 DIAGNOSIS — Z9861 Coronary angioplasty status: Secondary | ICD-10-CM | POA: Insufficient documentation

## 2020-04-06 DIAGNOSIS — Z7982 Long term (current) use of aspirin: Secondary | ICD-10-CM | POA: Insufficient documentation

## 2020-04-06 DIAGNOSIS — Z9119 Patient's noncompliance with other medical treatment and regimen: Secondary | ICD-10-CM | POA: Diagnosis not present

## 2020-04-06 DIAGNOSIS — M069 Rheumatoid arthritis, unspecified: Secondary | ICD-10-CM | POA: Diagnosis not present

## 2020-04-06 LAB — POCT GLYCOSYLATED HEMOGLOBIN (HGB A1C): HbA1c, POC (controlled diabetic range): 10.7 % — AB (ref 0.0–7.0)

## 2020-04-06 LAB — GLUCOSE, POCT (MANUAL RESULT ENTRY): POC Glucose: 297 mg/dl — AB (ref 70–99)

## 2020-04-06 MED ORDER — INSULIN GLARGINE 100 UNIT/ML ~~LOC~~ SOLN: 20.0000 [IU] | Freq: Every day | SUBCUTANEOUS | 2 refills | Status: DC

## 2020-04-06 MED ORDER — CARVEDILOL 3.125 MG PO TABS: 3.1250 mg | ORAL_TABLET | Freq: Two times a day (BID) | ORAL | 2 refills | Status: DC

## 2020-04-06 MED ORDER — MELOXICAM 7.5 MG PO TABS: 7.5000 mg | ORAL_TABLET | Freq: Every day | ORAL | 1 refills | Status: DC

## 2020-04-06 MED FILL — MELOXICAM 7.5 MG TABLET: 7.5 | 30 days supply | Qty: 30 | Fill #0

## 2020-04-06 MED FILL — CARVEDILOL 3.125 MG TABLET: 3.125 | 30 days supply | Qty: 60 | Fill #0

## 2020-04-06 MED FILL — PROMETHAZINE HCL 25 MG TABS: 25 | 7 days supply | Qty: 30 | Fill #0

## 2020-04-06 MED FILL — !LANTUS 100 UNITS/ML VIAL: 100 | 28 days supply | Qty: 10 | Fill #0

## 2020-04-06 MED FILL — ?HUMALOG 100 UNITS/ML VIAL: 100 | 28 days supply | Qty: 10 | Fill #0

## 2020-04-06 NOTE — Progress Notes (Signed)
Subjective:  Patient ID: Madison Williams, female    DOB: 30-Aug-1985  Age: 34 y.o. MRN: [id number]  CC: Diabetes   HPI Madison Williams is a 34 year old female with history ofypertension, type 1 diabetes mellitus (A1c 10.7), rheumatoid arthritis here to establish care.  Relocated from [place] to [place].  She complains of pain in his back, hips, knees, shoulders. Hands don't hurt as much .Has RA and has not seen a Rheumatologist since she was 72 as she lost her insurance. Informs me she took "Lyrica, Tylenol #3, Fioricet" for her pain. States the practice in [place] closed and she was unable to obtain records. She just uses Tylenol , CBD which provide minimal relief Pain is a 4/10.   With regards to her diabetes mellitus she has been out of Lantus fro 2 months. She obtained Novolin OTC from [facility] and is using between 5-10 units 3-5 x /day.  Denies presence of hypoglycemia. Reviewed her chart and she was scheduled for an echocardiogram to evaluate her cardiac function and chart reveals she was a no-show and attempt to contact her to reschedule failed.  The patient informs me she did not show for her echocardiogram because she has no medical coverage.   Past Medical History:  Diagnosis Date  . Asthma   . Diabetes mellitus without complication (HCC)   . Rheumatoid arthritis [facility])     Past Surgical History:  Procedure Laterality Date  . LAPAROSCOPIC OVARIAN    . LEFT HEART CATH AND CORONARY ANGIOGRAPHY N/A 11/24/2019   Procedure: LEFT HEART CATH AND CORONARY ANGIOGRAPHY;  Surgeon: Kathleene Hazel, MD;  Location: [facility] INVASIVE CV LAB;  Service: Cardiovascular;  Laterality: N/A;    History reviewed. No pertinent family history.  No Known Allergies  Outpatient Medications Prior to Visit  Medication Sig Dispense Refill  . albuterol (VENTOLIN HFA) 108 (90 Base) MCG/ACT inhaler Inhale 2 puffs into the lungs every 6 (six) hours as needed for wheezing or shortness of  breath. 18 g 1  . aspirin EC 81 MG EC tablet Take 1 tablet (81 mg total) by mouth daily. 30 tablet 0  . bismuth subsalicylate (PEPTO BISMOL) 262 MG/15ML suspension Take 30 mLs by mouth every 6 (six) hours as needed for indigestion or diarrhea or loose stools.    . insulin aspart (NOVOLOG) 100 UNIT/ML injection Inject 0-9 Units into the skin 3 (three) times daily with meals. By sliding scale 10 mL 11  . Insulin Syringes, Disposable, U-100 0.5 ML MISC 1 each by Does not apply route 4 (four) times daily - after meals and at bedtime. 100 each 5  . OVER THE COUNTER MEDICATION Take 10 g by mouth daily. Kratom (Used for withdrawal/cease cravings)    . pantoprazole (PROTONIX) 40 MG tablet Take 1 tablet (40 mg total) by mouth 2 (two) times daily before a meal. 60 tablet 1  . polyethylene glycol (MIRALAX / GLYCOLAX) 17 g packet Take 17 g by mouth daily as needed for moderate constipation. 14 each 0  . promethazine (PHENERGAN) 25 MG tablet Take 1 tablet (25 mg total) by mouth every 6 (six) hours as needed for nausea or vomiting. 30 tablet 2  . amLODipine (NORVASC) 2.5 MG tablet Take 1 tablet (2.5 mg total) by mouth daily. 30 tablet 0  . carvedilol (COREG) 3.125 MG tablet Take 1 tablet (3.125 mg total) by mouth 2 (two) times daily with a meal. 60 tablet 0  . insulin glargine (LANTUS) 100 UNIT/ML injection Inject 0.2 mLs (  20 Units total) into the skin daily. 10 mL 2  . nicotine (NICODERM CQ - DOSED IN MG/24 HOURS) 14 mg/24hr patch Place 1 patch (14 mg total) onto the skin daily. (Patient not taking: Reported on 01/14/2020) 28 patch 0  . potassium chloride 20 MEQ/15ML (10%) SOLN Take 30 mLs (40 mEq total) by mouth daily. (Patient not taking: Reported on 04/06/2020) 473 mL 1   No facility-administered medications prior to visit.     ROS Review of Systems  Constitutional: Negative for activity change, appetite change and fatigue.  HENT: Negative for congestion, sinus pressure and sore throat.   Eyes: Negative  for visual disturbance.  Respiratory: Negative for cough, chest tightness, shortness of breath and wheezing.   Cardiovascular: Negative for chest pain and palpitations.  Gastrointestinal: Negative for abdominal distention, abdominal pain and constipation.  Endocrine: Negative for polydipsia.  Genitourinary: Negative for dysuria and frequency.  Musculoskeletal:       See HPI  Skin: Negative for rash.  Neurological: Negative for tremors, light-headedness and numbness.  Hematological: Does not bruise/bleed easily.  Psychiatric/Behavioral: Negative for agitation and behavioral problems.    Objective:  BP 95/60   Pulse (!) 101   Ht 5\' 7"  (1.702 m)   Wt 102 lb (46.3 kg)   LMP 03/16/2018 Comment: negative urine preg test 01/14/20  SpO2 97%   BMI 15.98 kg/m   BP/Weight 04/06/2020 02/05/2020 01/18/2020  Systolic BP 95 115 125  Diastolic BP 60 73 88  Wt. (Lbs) 102 104 -  BMI 15.98 16.29 -      Physical Exam Constitutional:      Appearance: She is well-developed.  Neck:     Vascular: No JVD.  Cardiovascular:     Rate and Rhythm: Normal rate.     Heart sounds: Normal heart sounds. No murmur heard.   Pulmonary:     Effort: Pulmonary effort is normal.     Breath sounds: Normal breath sounds. No wheezing or rales.  Chest:     Chest wall: No tenderness.  Abdominal:     General: Bowel sounds are normal. There is no distension.     Palpations: Abdomen is soft. There is no mass.     Tenderness: There is no abdominal tenderness.  Musculoskeletal:        General: Normal range of motion.     Right lower leg: No edema.     Left lower leg: No edema.  Neurological:     Mental Status: She is alert and oriented to person, place, and time.  Psychiatric:        Mood and Affect: Mood normal.     CMP Latest Ref Rng & Units 02/05/2020 01/18/2020 01/17/2020  Glucose 65 - 99 mg/dL 01/19/2020) 409(W) 119(J)  BUN 6 - 20 mg/dL 11 8 9   Creatinine 0.57 - 1.00 mg/dL 478(G 9.56  Sodium 134 - 144 mmol/L  130(L) 135 132(L)  Potassium 3.5 - 5.2 mmol/L 3.1(L) 3.4(L) 2.6(LL)  Chloride 96 - 106 mmol/L 84(L) 98 98  CO2 20 - 29 mmol/L 20 26 26   Calcium 8.7 - 10.2 mg/dL 2.13) 8.3(L) 8.0(L)  Total Protein 6.0 - 8.5 g/dL 6.9 - -  Total Bilirubin 0.0 - 1.2 mg/dL 0.3 - -  Alkaline Phos 48 - 121 IU/L 110 - -  AST 0 - 40 IU/L 133(H) - -  ALT 0 - 32 IU/L 123(H) - -    Lipid Panel     Component Value Date/Time   CHOL 275 (  H) 02/05/2020 1152   TRIG 70 02/05/2020 1152   HDL 138 02/05/2020 1152   CHOLHDL 2.0 02/05/2020 1152   CHOLHDL 2.1 11/25/2019 0920   VLDL 16 11/25/2019 0920   LDLCALC 126 (H) 02/05/2020 1152    CBC    Component Value Date/Time   WBC 4.3 01/17/2020 0315   RBC 3.82 (L) 01/17/2020 0315   HGB 12.1 01/17/2020 0315   HCT 37.1 01/17/2020 0315   PLT 152 01/17/2020 0315   MCV 97.1 01/17/2020 0315   MCH 31.7 01/17/2020 0315   MCHC 32.6 01/17/2020 0315   RDW 13.2 01/17/2020 0315   LYMPHSABS 1.3 11/27/2019 0222   MONOABS 0.5 11/27/2019 0222   EOSABS 0.0 11/27/2019 0222   BASOSABS 0.0 11/27/2019 0222    Lab Results  Component Value Date   HGBA1C 10.7 (A) 04/06/2020    Assessment & Plan:  1. Type 1 diabetes mellitus with hyperglycemia (HCC) Uncontrolled with A1c of 10.7; goal is less than 7.0 Noncompliance contributory I have sent her refills to the pharmacy in house Advised to apply for the [place] financial discount Counseled on Diabetic diet, my plate method, 326 minutes of moderate intensity exercise/week Blood sugar logs with fasting goals of 80-120 mg/dl, random of less than 712 and in the event of sugars less than 60 mg/dl or greater than 458 mg/dl encouraged to notify the clinic. Advised on the need for annual eye exams, annual foot exams, Pneumonia vaccine. - POCT glucose (manual entry) - POCT glycosylated hemoglobin (Hb A1C) - insulin glargine (LANTUS) 100 UNIT/ML injection; Inject 0.2 mLs (20 Units total) into the skin daily.  Dispense: 10 mL; Refill:  2 - Basic Metabolic Panel  2. Rheumatoid arthritis, involving unspecified site, unspecified whether rheumatoid factor present (HCC) Pain is 4/10 at this time and there is no indication for treatment with prednisone Goal will be to refer her to rheumatology for optimization of management - meloxicam (MOBIC) 7.5 MG tablet; Take 1 tablet (7.5 mg total) by mouth daily.  Dispense: 30 tablet; Refill: 1 - RA Qn+CCP(IgG/A)+SjoSSA+SjoSSB  3. Essential hypertension Soft blood pressure Discontinue amlodipine to prevent hypotension Counseled on blood pressure goal of less than 130/80, low-sodium, DASH diet, medication compliance, 150 minutes of moderate intensity exercise per week. Discussed medication compliance, adverse effects. - carvedilol (COREG) 3.125 MG tablet; Take 1 tablet (3.125 mg total) by mouth 2 (two) times daily with a meal.  Dispense: 60 tablet; Refill: 2   Meds ordered this encounter  Medications  . meloxicam (MOBIC) 7.5 MG tablet    Sig: Take 1 tablet (7.5 mg total) by mouth daily.    Dispense:  30 tablet    Refill:  1  . insulin glargine (LANTUS) 100 UNIT/ML injection    Sig: Inject 0.2 mLs (20 Units total) into the skin daily.    Dispense:  10 mL    Refill:  2  . carvedilol (COREG) 3.125 MG tablet    Sig: Take 1 tablet (3.125 mg total) by mouth 2 (two) times daily with a meal.    Dispense:  60 tablet    Refill:  2    Follow-up: Return in about 3 months (around 07/06/2020) for Chronic disease management.       Hoy Register, MD, FAAFP. [facility] [place], [place] [phone]   04/06/2020, 1:25 PM

## 2020-04-06 NOTE — Progress Notes (Signed)
Having pain in back,hips,knees and shoulders.  Having numbness in feet over the last week.

## 2020-04-06 NOTE — Patient Instructions (Addendum)
Heart care [phone]    Diabetes Mellitus and Nutrition, Adult When you have diabetes (diabetes mellitus), it is very important to have healthy eating habits because your blood sugar (glucose) levels are greatly affected by what you eat and drink. Eating healthy foods in the appropriate amounts, at about the same times every day, can help you:  Control your blood glucose.  Lower your risk of heart disease.  Improve your blood pressure.  Reach or maintain a healthy weight. Every person with diabetes is different, and each person has different needs for a meal plan. Your health care provider may recommend that you work with a diet and nutrition specialist (dietitian) to make a meal plan that is best for you. Your meal plan may vary depending on factors such as:  The calories you need.  The medicines you take.  Your weight.  Your blood glucose, blood pressure, and cholesterol levels.  Your activity level.  Other health conditions you have, such as heart or kidney disease. How do carbohydrates affect me? Carbohydrates, also called carbs, affect your blood glucose level more than any other type of food. Eating carbs naturally raises the amount of glucose in your blood. Carb counting is a method for keeping track of how many carbs you eat. Counting carbs is important to keep your blood glucose at a healthy level, especially if you use insulin or take certain oral diabetes medicines. It is important to know how many carbs you can safely have in each meal. This is different for every person. Your dietitian can help you calculate how many carbs you should have at each meal and for each snack. Foods that contain carbs include:  Bread, cereal, rice, pasta, and crackers.  Potatoes and corn.  Peas, beans, and lentils.  Milk and yogurt.  Fruit and juice.  Desserts, such as cakes, cookies, ice cream, and candy. How does alcohol affect me? Alcohol can cause a sudden decrease in blood  glucose (hypoglycemia), especially if you use insulin or take certain oral diabetes medicines. Hypoglycemia can be a life-threatening condition. Symptoms of hypoglycemia (sleepiness, dizziness, and confusion) are similar to symptoms of having too much alcohol. If your health care provider says that alcohol is safe for you, follow these guidelines:  Limit alcohol intake to no more than 1 drink per day for nonpregnant women and 2 drinks per day for men. One drink equals 12 oz of beer, 5 oz of wine, or 1 oz of hard liquor.  Do not drink on an empty stomach.  Keep yourself hydrated with water, diet soda, or unsweetened iced tea.  Keep in mind that regular soda, juice, and other mixers may contain a lot of sugar and must be counted as carbs. What are tips for following this plan?  Reading food labels  Start by checking the serving size on the "Nutrition Facts" label of packaged foods and drinks. The amount of calories, carbs, fats, and other nutrients listed on the label is based on one serving of the item. Many items contain more than one serving per package.  Check the total grams (g) of carbs in one serving. You can calculate the number of servings of carbs in one serving by dividing the total carbs by 15. For example, if a food has 30 g of total carbs, it would be equal to 2 servings of carbs.  Check the number of grams (g) of saturated and trans fats in one serving. Choose foods that have low or no amount of these  fats.  Check the number of milligrams (mg) of salt (sodium) in one serving. Most people should limit total sodium intake to less than 2,300 mg per day.  Always check the nutrition information of foods labeled as "low-fat" or "nonfat". These foods may be higher in added sugar or refined carbs and should be avoided.  Talk to your dietitian to identify your daily goals for nutrients listed on the label. Shopping  Avoid buying canned, premade, or processed foods. These foods tend to  be high in fat, sodium, and added sugar.  Shop around the outside edge of the grocery store. This includes fresh fruits and vegetables, bulk grains, fresh meats, and fresh dairy. Cooking  Use low-heat cooking methods, such as baking, instead of high-heat cooking methods like deep frying.  Cook using healthy oils, such as olive, canola, or sunflower oil.  Avoid cooking with butter, cream, or high-fat meats. Meal planning  Eat meals and snacks regularly, preferably at the same times every day. Avoid going long periods of time without eating.  Eat foods high in fiber, such as fresh fruits, vegetables, beans, and whole grains. Talk to your dietitian about how many servings of carbs you can eat at each meal.  Eat 4-6 ounces (oz) of lean protein each day, such as lean meat, chicken, fish, eggs, or tofu. One oz of lean protein is equal to: ? 1 oz of meat, chicken, or fish. ? 1 egg. ?  cup of tofu.  Eat some foods each day that contain healthy fats, such as avocado, nuts, seeds, and fish. Lifestyle  Check your blood glucose regularly.  Exercise regularly as told by your health care provider. This may include: ? 150 minutes of moderate-intensity or vigorous-intensity exercise each week. This could be brisk walking, biking, or water aerobics. ? Stretching and doing strength exercises, such as yoga or weightlifting, at least 2 times a week.  Take medicines as told by your health care provider.  Do not use any products that contain nicotine or tobacco, such as cigarettes and e-cigarettes. If you need help quitting, ask your health care provider.  Work with a Social worker or diabetes educator to identify strategies to manage stress and any emotional and social challenges. Questions to ask a health care provider  Do I need to meet with a diabetes educator?  Do I need to meet with a dietitian?  What number can I call if I have questions?  When are the best times to check my blood  glucose? Where to find more information:  American Diabetes Association: diabetes.org  Academy of Nutrition and Dietetics: www.eatright.[employer] of Diabetes and Digestive and Kidney Diseases (NIH): [employer] Summary  A healthy meal plan will help you control your blood glucose and maintain a healthy lifestyle.  Working with a diet and nutrition specialist (dietitian) can help you make a meal plan that is best for you.  Keep in mind that carbohydrates (carbs) and alcohol have immediate effects on your blood glucose levels. It is important to count carbs and to use alcohol carefully. This information is not intended to replace advice given to you by your health care provider. Make sure you discuss any questions you have with your health care provider. Document Revised: 06/29/2017 Document Reviewed: 08/21/2016 Elsevier Patient Education  2020 [employer].

## 2020-04-08 ENCOUNTER — Telehealth: Payer: Self-pay

## 2020-04-08 LAB — BASIC METABOLIC PANEL
BUN/Creatinine Ratio: 15 (ref 9–23)
BUN: 11 mg/dL (ref 6–20)
CO2: 24 mmol/L (ref 20–29)
Calcium: 10 mg/dL (ref 8.7–10.2)
Chloride: 91 mmol/L — ABNORMAL LOW (ref 96–106)
Creatinine, Ser: 0.72 mg/dL (ref 0.57–1.00)
GFR calc Af Amer: 127 mL/min/{1.73_m2} (ref >59)
GFR calc non Af Amer: 110 mL/min/{1.73_m2} (ref >59)
Glucose: 200 mg/dL — ABNORMAL HIGH (ref 65–99)
Potassium: 4.5 mmol/L (ref 3.5–5.2)
Sodium: 132 mmol/L — ABNORMAL LOW (ref 134–144)

## 2020-04-08 LAB — RA QN+CCP(IGG/A)+SJOSSA+SJOSSB
Cyclic Citrullin Peptide Ab: 6 units (ref 0–19)
ENA SSA (RO) Ab: <0.2 AI (ref 0.0–0.9)
ENA SSB (LA) Ab: <0.2 AI (ref 0.0–0.9)
Rheumatoid fact SerPl-aCnc: 11.3 IU/mL (ref 0.0–13.9)

## 2020-04-08 NOTE — Telephone Encounter (Signed)
-----   Message from Hoy Register, MD sent at 04/08/2020  6:13 AM EDT ----- Rheumatoid labs are negative. All other labs are stable

## 2020-04-08 NOTE — Telephone Encounter (Signed)
Patient name and DOB has been verified Patient was informed of lab results. Patient had no questions.  

## 2020-05-31 MED FILL — LANTUS 100 UNITS/ML VIAL: 100 | 28 days supply | Qty: 10 | Fill #1

## 2020-05-31 MED FILL — ?HUMALOG 100 UNITS/ML VIAL: 100 | 28 days supply | Qty: 10 | Fill #1

## 2020-06-07 MED FILL — PROMETHAZINE HCL 25 MG TABS: 25 | 7 days supply | Qty: 30 | Fill #1

## 2020-06-08 MED FILL — HumaLOG 100 UNIT/ML SOLN: 100 | 28 days supply | Qty: 10 | Fill #1

## 2020-06-08 MED FILL — LANTUS 100 UNITS/ML VIAL: 100 | 28 days supply | Qty: 10 | Fill #0

## 2020-06-21 ENCOUNTER — Telehealth: Payer: Self-pay | Admitting: General Practice

## 2020-06-21 NOTE — Telephone Encounter (Signed)
Pt has been called and informed that letter is ready for pick up.

## 2020-06-21 NOTE — Telephone Encounter (Signed)
Will route to PCP for review. 

## 2020-06-21 NOTE — Telephone Encounter (Signed)
Copied from CRM [id number]. 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 Alvis Lemmings that the pt is ok to work over night/ please advise when ready to pick up >> Jun 21, 2020 10:00 AM Crist Infante wrote: Pt following up on request.  Pt just needs a note stating she is healthy and ok to work 3rd shift. (over night) Pt has got to have it today.  She would like to pick up.  Her dr is Dr Alvis Lemmings she says

## 2020-06-21 NOTE — Telephone Encounter (Signed)
Letter is ready for pick-up.

## 2020-06-28 ENCOUNTER — Encounter (HOSPITAL_COMMUNITY): Payer: Self-pay

## 2020-06-28 ENCOUNTER — Emergency Department (HOSPITAL_COMMUNITY)
Admission: EM | Admit: 2020-06-28 | Discharge: 2020-06-29 | Disposition: A | Discharge status: Home / Self Care | Payer: Medicaid Other | Attending: Emergency Medicine | Admitting: Emergency Medicine

## 2020-06-28 ENCOUNTER — Other Ambulatory Visit: Payer: Self-pay

## 2020-06-28 ENCOUNTER — Emergency Department (HOSPITAL_COMMUNITY): Payer: Medicaid Other

## 2020-06-28 DIAGNOSIS — Y908 Blood alcohol level of 240 mg/100 ml or more: Secondary | ICD-10-CM | POA: Insufficient documentation

## 2020-06-28 DIAGNOSIS — E1010 Type 1 diabetes mellitus with ketoacidosis without coma: Secondary | ICD-10-CM | POA: Diagnosis not present

## 2020-06-28 DIAGNOSIS — F1010 Alcohol abuse, uncomplicated: Secondary | ICD-10-CM | POA: Diagnosis present

## 2020-06-28 DIAGNOSIS — Z794 Long term (current) use of insulin: Secondary | ICD-10-CM | POA: Diagnosis not present

## 2020-06-28 DIAGNOSIS — Z87891 Personal history of nicotine dependence: Secondary | ICD-10-CM | POA: Insufficient documentation

## 2020-06-28 DIAGNOSIS — J4520 Mild intermittent asthma, uncomplicated: Secondary | ICD-10-CM | POA: Diagnosis not present

## 2020-06-28 DIAGNOSIS — R112 Nausea with vomiting, unspecified: Secondary | ICD-10-CM | POA: Diagnosis not present

## 2020-06-28 DIAGNOSIS — Z7984 Long term (current) use of oral hypoglycemic drugs: Secondary | ICD-10-CM | POA: Insufficient documentation

## 2020-06-28 DIAGNOSIS — Z7982 Long term (current) use of aspirin: Secondary | ICD-10-CM | POA: Diagnosis not present

## 2020-06-28 DIAGNOSIS — Z955 Presence of coronary angioplasty implant and graft: Secondary | ICD-10-CM | POA: Insufficient documentation

## 2020-06-28 DIAGNOSIS — R000 Tachycardia, unspecified: Secondary | ICD-10-CM | POA: Insufficient documentation

## 2020-06-28 DIAGNOSIS — E1065 Type 1 diabetes mellitus with hyperglycemia: Secondary | ICD-10-CM | POA: Diagnosis not present

## 2020-06-28 LAB — ETHANOL: Alcohol, Ethyl (B): 301 mg/dL (ref <10)

## 2020-06-28 LAB — BASIC METABOLIC PANEL
Anion gap: 19 — ABNORMAL HIGH (ref 5–15)
BUN: 12 mg/dL (ref 6–20)
CO2: 20 mmol/L — ABNORMAL LOW (ref 22–32)
Calcium: 9.2 mg/dL (ref 8.9–10.3)
Chloride: 98 mmol/L (ref 98–111)
Creatinine, Ser: 0.48 mg/dL (ref 0.44–1.00)
GFR, Estimated: >60 mL/min (ref >60)
Glucose, Bld: 379 mg/dL — ABNORMAL HIGH (ref 70–99)
Potassium: 3.6 mmol/L (ref 3.5–5.1)
Sodium: 137 mmol/L (ref 135–145)

## 2020-06-28 LAB — HCG, QUANTITATIVE, PREGNANCY: hCG, Beta Chain, Quant, S: <1 m[IU]/mL (ref <5)

## 2020-06-28 LAB — URINALYSIS, ROUTINE W REFLEX MICROSCOPIC
Bacteria, UA: NONE SEEN
Bilirubin Urine: NEGATIVE
Glucose, UA: >=500 mg/dL — AB
Hgb urine dipstick: NEGATIVE
Ketones, ur: NEGATIVE mg/dL
Leukocytes,Ua: NEGATIVE
Nitrite: NEGATIVE
Protein, ur: NEGATIVE mg/dL
Specific Gravity, Urine: 1.003 — ABNORMAL LOW (ref 1.005–1.030)
pH: 6 (ref 5.0–8.0)

## 2020-06-28 LAB — CBC
HCT: 39.1 % (ref 36.0–46.0)
Hemoglobin: 13.3 g/dL (ref 12.0–15.0)
MCH: 32.4 pg (ref 26.0–34.0)
MCHC: 34 g/dL (ref 30.0–36.0)
MCV: 95.1 fL (ref 80.0–100.0)
Platelets: 316 10*3/uL (ref 150–400)
RBC: 4.11 MIL/uL (ref 3.87–5.11)
RDW: 13.2 % (ref 11.5–15.5)
WBC: 6.8 10*3/uL (ref 4.0–10.5)
nRBC: 0 % (ref 0.0–0.2)

## 2020-06-28 LAB — CBG MONITORING, ED: Glucose-Capillary: 387 mg/dL — ABNORMAL HIGH (ref 70–99)

## 2020-06-28 LAB — TROPONIN I (HIGH SENSITIVITY)
Troponin I (High Sensitivity): 5 ng/L (ref <18)
Troponin I (High Sensitivity): 2 ng/L (ref <18)

## 2020-06-28 MED ORDER — ONDANSETRON HCL 4 MG/2ML IJ SOLN
4.0000 mg | Freq: Once | INTRAMUSCULAR | Status: AC
  Administered 2020-06-28: 4 mg via INTRAVENOUS
  Filled 2020-06-28: qty 2

## 2020-06-28 MED ORDER — SODIUM CHLORIDE 0.9 % IV BOLUS
1000.0000 mL | Freq: Once | INTRAVENOUS | Status: AC
  Administered 2020-06-28: 1000 mL via INTRAVENOUS

## 2020-06-28 MED ORDER — LORAZEPAM 2 MG/ML IJ SOLN
1.0000 mg | Freq: Once | INTRAMUSCULAR | Status: AC
  Administered 2020-06-28: 1 mg via INTRAVENOUS
  Filled 2020-06-28: qty 1

## 2020-06-28 MED ORDER — INSULIN ASPART 100 UNIT/ML ~~LOC~~ SOLN
10.0000 [IU] | Freq: Once | SUBCUTANEOUS | Status: AC
  Administered 2020-06-28: 10 [IU] via SUBCUTANEOUS
  Filled 2020-06-28: qty 0.1

## 2020-06-28 NOTE — ED Triage Notes (Addendum)
Patient states she is trying to self detox from Kratom and alcohol. Patient states that  It has been 12 hours since using either. Patient reports a history of seizures when she has tried to detox.  Patient c/o mid chest pain approx 45 minutes ago. Patient avery tearful in triage.  Patient c/o right eye pain. Patient has a raised area inside the bottom eyelid.  CBG in triage-387.

## 2020-06-28 NOTE — ED Notes (Addendum)
Date and time results received: 06/28/20 2337 (use smartphrase ".now" to insert current time)  Test: ETOH Critical Value: 301   Name of Provider Notified: Dykstra MD  Orders Received? Or Actions Taken?:No new orders

## 2020-06-29 ENCOUNTER — Other Ambulatory Visit: Payer: Self-pay | Admitting: Emergency Medicine

## 2020-06-29 LAB — BLOOD GAS, VENOUS
Acid-base deficit: 0.5 mmol/L (ref 0.0–2.0)
Bicarbonate: 24.9 mmol/L (ref 20.0–28.0)
O2 Saturation: 90.7 %
Patient temperature: 98.6
pCO2, Ven: 46.8 mmHg (ref 44.0–60.0)
pH, Ven: 7.345 (ref 7.250–7.430)
pO2, Ven: 73.1 mmHg — ABNORMAL HIGH (ref 32.0–45.0)

## 2020-06-29 LAB — CBG MONITORING, ED: Glucose-Capillary: 125 mg/dL — ABNORMAL HIGH (ref 70–99)

## 2020-06-29 MED ORDER — ONDANSETRON 4 MG PO TBDP: 4.0000 mg | ORAL_TABLET | Freq: Three times a day (TID) | ORAL | 0 refills | Status: DC | PRN

## 2020-06-29 MED ORDER — CHLORDIAZEPOXIDE HCL 25 MG PO CAPS: ORAL_CAPSULE | ORAL | 0 refills | Status: DC

## 2020-06-29 MED FILL — ONDANSETRON ODT 4 MG TABLET: 4 | 6 days supply | Qty: 20 | Fill #0

## 2020-06-29 MED FILL — CHLORDIAZEPOXIDE 25 MG CAP: 25 | 3 days supply | Qty: 6 | Fill #0

## 2020-06-29 NOTE — Discharge Instructions (Signed)
Please take your insulin as previously prescribed.  Take Zofran as needed for nausea.  If you wish to stop alcohol, recommend using the Librium taper as discussed.  Recommend following up with your primary doctor and pursuing outpatient resources for your alcohol abuse.  If you start vomiting, or develop other new concerning symptoms, return to ER for reassessment.

## 2020-06-29 NOTE — ED Provider Notes (Signed)
[facility]-EMERGENCY DEPT Provider Note   CSN: [id number] Arrival date & time: 06/28/20  1835     History Chief Complaint  Patient presents with  . Chest Pain  . self detoxing  . Eye Pain    Madison Williams is a 34 y.o. female.  Presents to ER with concern for trying to detox from kratom and alcohol.  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  States that she has been doing the kratom as an alternative to opioids.  States that her last drink was greater than 12 hours ago, just had 1 drink this morning.  Also states that she noted slight swelling to her right lower eyelid, no change in vision.  No pain in eye.   HPI     Past Medical History:  Diagnosis Date  . Asthma   . Diabetes mellitus without complication (HCC)   . Rheumatoid arthritis [facility])     Patient Active Problem List   Diagnosis Date Noted  . Rheumatoid arthritis (HCC)   . Substance withdrawal (HCC) 01/14/2020  . SOB (shortness of breath)   . Hypokalemia   . Macrocytosis without anemia   . Retroperitoneal hematoma   . DKA (diabetic ketoacidosis) (HCC) 11/24/2019  . Acute ST elevation myocardial infarction (STEMI) of anterolateral wall (HCC)   . Abnormal EKG   . Elevated troponin   . Nausea & vomiting 10/16/2019  . Abdominal pain 10/16/2019  . Chest tightness   . Mild intermittent asthma without complication   . Acute vomiting 01/31/2019  . Type 1 diabetes mellitus with hyperglycemia (HCC) 01/31/2019  . Uninsured 01/31/2019  . DKA (diabetic ketoacidoses) 03/30/2018  . AKI (acute kidney injury) (HCC) 03/30/2018  . Leukocytosis 03/30/2018  . Dehydration 03/30/2018  . Tobacco use disorder 03/30/2018    Past Surgical History:  Procedure Laterality Date  . LAPAROSCOPIC OVARIAN    . LEFT HEART CATH AND CORONARY ANGIOGRAPHY N/A 11/24/2019   Procedure: LEFT HEART CATH AND CORONARY ANGIOGRAPHY;  Surgeon: Kathleene Hazel, MD;   Location: [facility] INVASIVE CV LAB;  Service: Cardiovascular;  Laterality: N/A;     OB History   No obstetric history on file.     History reviewed. No pertinent family history.  Social History   Tobacco Use  . Smoking status: Former Smoker    Packs/day: 1.00  . Smokeless tobacco: Never Used  Vaping Use  . Vaping Use: Every day  . Substances: Nicotine  Substance Use Topics  . Alcohol use: Yes  . Drug use: Yes    Comment: Kratum    Home Medications Prior to Admission medications   Medication Sig Start Date End Date Taking? Authorizing Provider  albuterol (VENTOLIN HFA) 108 (90 Base) MCG/ACT inhaler Inhale 2 puffs into the lungs every 6 (six) hours as needed for wheezing or shortness of breath. 02/05/20  Yes Georgian Co M, PA-C  amLODipine (NORVASC) 5 MG tablet Take 5 mg by mouth daily.   Yes [provider]  bismuth subsalicylate (PEPTO BISMOL) 262 MG/15ML suspension Take 30 mLs by mouth every 6 (six) hours as needed for indigestion or diarrhea or loose stools.   Yes [provider]  HUMALOG 100 UNIT/ML injection Inject 5-10 Units into the skin 3 (three) times daily with meals. Sliding scale. 06/08/20  Yes [provider]  ibuprofen (ADVIL) 200 MG tablet Take 200 mg by mouth every 6 (six) hours as needed for moderate pain.   Yes  [provider]  insulin glargine (LANTUS) 100 UNIT/ML injection Inject 0.2 mLs (20 Units total) into the skin daily. 04/06/20  Yes Hoy Register, MD  OVER THE COUNTER MEDICATION Take 10 g by mouth daily. Kratom (Used for withdrawal/cease cravings)   Yes [provider]  pantoprazole (PROTONIX) 40 MG tablet Take 1 tablet (40 mg total) by mouth 2 (two) times daily before a meal. Patient taking differently: Take 40 mg by mouth See admin instructions. Takes 40 mg 2 times a week as needed for acid reflux 02/05/20  Yes McClung, Angela M, PA-C  promethazine (PHENERGAN) 25 MG tablet Take 1 tablet (25 mg total) by mouth every  6 (six) hours as needed for nausea or vomiting. 02/05/20  Yes Anders Simmonds, PA-C  aspirin EC 81 MG EC tablet Take 1 tablet (81 mg total) by mouth daily. Patient not taking: Reported on 06/28/2020 11/29/19   Edsel Petrin, DO  carvedilol (COREG) 3.125 MG tablet Take 1 tablet (3.125 mg total) by mouth 2 (two) times daily with a meal. Patient not taking: Reported on 06/28/2020 04/06/20   Hoy Register, MD  insulin aspart (NOVOLOG) 100 UNIT/ML injection Inject 0-9 Units into the skin 3 (three) times daily with meals. By sliding scale Patient not taking: Reported on 06/28/2020 02/05/20   Anders Simmonds, PA-C  Insulin Syringes, Disposable, U-100 0.5 ML MISC 1 each by Does not apply route 4 (four) times daily - after meals and at bedtime. 02/05/20   Anders Simmonds, PA-C  meloxicam (MOBIC) 7.5 MG tablet Take 1 tablet (7.5 mg total) by mouth daily. Patient not taking: Reported on 06/28/2020 04/06/20   Hoy Register, MD  nicotine (NICODERM CQ - DOSED IN MG/24 HOURS) 14 mg/24hr patch Place 1 patch (14 mg total) onto the skin daily. Patient not taking: Reported on 01/14/2020 11/29/19   Edsel Petrin, DO  polyethylene glycol (MIRALAX / GLYCOLAX) 17 g packet Take 17 g by mouth daily as needed for moderate constipation. Patient not taking: Reported on 06/28/2020 11/29/19   Edsel Petrin, DO  potassium chloride 20 MEQ/15ML (10%) SOLN Take 30 mLs (40 mEq total) by mouth daily. Patient not taking: Reported on 04/06/2020 01/18/20   Esaw Grandchild A, DO    Allergies    Patient has no known allergies.  Review of Systems   Review of Systems  Physical Exam Updated Vital Signs BP 104/61   Pulse 85   Temp 97.9 F (36.6 C) (Oral)   Resp 18   Ht 5\' 7"  (1.702 m)   Wt 54.4 kg   LMP 03/16/2018 Comment: negative urine preg test 01/14/20  SpO2 94%   BMI 18.79 kg/m   Physical Exam Vitals and nursing note reviewed.  Constitutional:      General: She is not in acute distress.    Appearance: She is  well-developed.  HENT:     Head: Normocephalic and atraumatic.  Eyes:     Extraocular Movements: Extraocular movements intact.     Conjunctiva/sclera: Conjunctivae normal.     Pupils: Pupils are equal, round, and reactive to light.     Comments: Mild swelling to lower eyelid, no significant erythema, no induration; eye itself appears normal, normal conjunctiva, scleara, normal EOM  Cardiovascular:     Rate and Rhythm: Regular rhythm. Tachycardia present.     Heart sounds: Normal heart sounds. No murmur heard.   Pulmonary:     Effort: Pulmonary effort is normal. No respiratory distress.     Breath sounds: Normal breath  sounds.  Abdominal:     Palpations: Abdomen is soft.     Tenderness: There is no abdominal tenderness.  Musculoskeletal:     Cervical back: Neck supple.  Skin:    General: Skin is warm and dry.     Capillary Refill: Capillary refill takes less than 2 seconds.  Neurological:     General: No focal deficit present.     Mental Status: She is alert.     ED Results / Procedures / Treatments   Labs (all labs ordered are listed, but only abnormal results are displayed) Labs Reviewed  BASIC METABOLIC PANEL - Abnormal; Notable for the following components:      Result Value   CO2 20 (*)    Glucose, Bld 379 (*)    Anion gap 19 (*)    All other components within normal limits  URINALYSIS, ROUTINE W REFLEX MICROSCOPIC - Abnormal; Notable for the following components:   Color, Urine COLORLESS (*)    Specific Gravity, Urine 1.003 (*)    Glucose, UA >=500 (*)    All other components within normal limits  ETHANOL - Abnormal; Notable for the following components:   Alcohol, Ethyl (B) 301 (*)    All other components within normal limits  CBG MONITORING, ED - Abnormal; Notable for the following components:   Glucose-Capillary 387 (*)    All other components within normal limits  CBC  HCG, QUANTITATIVE, PREGNANCY  BLOOD GAS, VENOUS  CBG MONITORING, ED  CBG MONITORING,  ED  TROPONIN I (HIGH SENSITIVITY)  TROPONIN I (HIGH SENSITIVITY)    EKG EKG Interpretation  Date/Time:  Monday June 28 2020 18:49:10 EST Ventricular Rate:  122 PR Interval:    QRS Duration: 104 QT Interval:  336 QTC Calculation: 479 R Axis:   86 Text Interpretation: Sinus tachycardia Atrial premature complex Probable left atrial enlargement Inferior infarct, age indeterminate Baseline wander in lead(s) II III aVF 12 Lead; Mason-Likar Confirmed by Marianna Fuss ([id number]) on 06/28/2020 9:20:52 PM   Radiology DG Chest 2 View  Result Date: 06/28/2020 CLINICAL DATA:  Chest pain, detox from kratom and alcohol EXAM: CHEST - 2 VIEW COMPARISON:  Radiograph 11/23/2019 FINDINGS: No consolidation, features of edema, pneumothorax, or effusion. Pulmonary vascularity is normally distributed. The cardiomediastinal contours are unremarkable. No acute osseous or soft tissue abnormality. IMPRESSION: No acute cardiopulmonary abnormality. Electronically Signed   By: Kreg Shropshire M.D.   On: 06/28/2020 19:36    Procedures Procedures (including critical care time)  Medications Ordered in ED Medications  insulin aspart (novoLOG) injection 10 Units (10 Units Subcutaneous Given 06/28/20 2213)  sodium chloride 0.9 % bolus 1,000 mL (1,000 mLs Intravenous New Bag/Given (Non-Interop) 06/28/20 2213)  LORazepam (ATIVAN) injection 1 mg (1 mg Intravenous Given 06/28/20 2307)  ondansetron (ZOFRAN) injection 4 mg (4 mg Intravenous Given 06/28/20 2306)    ED Course  I have reviewed the triage vital signs and the nursing notes.  Pertinent labs & imaging results that were available during my care of the patient were reviewed by me and considered in my medical decision making (see chart for details).    MDM Rules/Calculators/A&P                         34 year old lady presented to ER with concern for alcohol abuse, kratom abuse.  She also had reported some nausea and vomiting.  On exam patient somewhat  disheveled but in no acute distress, mildly tachycardic.  Abdomen soft.  On initial blood work noted hyperglycemia, borderline anion gap, bicarb 20.  Noted alcohol 300.  Provided fluids, subcu insulin, checked VBG.  VBG normal pH, normal bicarbonate, no DKA.  Repeat CBG within normal limits.  After receiving fluids, Zofran, single dose of Ativan, patient symptoms greatly resolved, vital signs stable, tolerating p.o. without difficulty.  Suspect symptoms related to alcohol abuse.  Given her current reassuring appearance, believe she is appropriate for outpatient management.  Provided outpatient resources, offered Librium taper.  Reviewed return precautions in detail and discharged home.   After the discussed management above, the patient was determined to be safe for discharge.  The patient was in agreement with this plan and all questions regarding their care were answered.  ED return precautions were discussed and the patient will return to the ED with any significant worsening of condition.  Final Clinical Impression(s) / ED Diagnoses Final diagnoses:  Alcohol abuse  Type 1 diabetes mellitus with hyperglycemia [facility])    Rx / DC Orders ED Discharge Orders    None       Milagros Loll, MD 06/29/20 [phone]

## 2020-07-06 ENCOUNTER — Ambulatory Visit: Payer: Medicaid Other | Admitting: Family Medicine

## 2020-07-07 ENCOUNTER — Encounter: Payer: Self-pay | Admitting: Family Medicine

## 2020-07-07 ENCOUNTER — Other Ambulatory Visit: Payer: Self-pay | Admitting: Family Medicine

## 2020-07-07 ENCOUNTER — Other Ambulatory Visit: Payer: Self-pay

## 2020-07-07 ENCOUNTER — Ambulatory Visit: Payer: Medicaid Other | Attending: Family Medicine | Admitting: Family Medicine

## 2020-07-07 VITALS — BP 125/75 | HR 112 | Ht 67.0 in | Wt 122.4 lb

## 2020-07-07 DIAGNOSIS — F419 Anxiety disorder, unspecified: Secondary | ICD-10-CM

## 2020-07-07 DIAGNOSIS — E1169 Type 2 diabetes mellitus with other specified complication: Secondary | ICD-10-CM

## 2020-07-07 DIAGNOSIS — E1159 Type 2 diabetes mellitus with other circulatory complications: Secondary | ICD-10-CM

## 2020-07-07 DIAGNOSIS — E1065 Type 1 diabetes mellitus with hyperglycemia: Secondary | ICD-10-CM

## 2020-07-07 DIAGNOSIS — I152 Hypertension secondary to endocrine disorders: Secondary | ICD-10-CM

## 2020-07-07 DIAGNOSIS — E119 Type 2 diabetes mellitus without complications: Secondary | ICD-10-CM

## 2020-07-07 DIAGNOSIS — E1043 Type 1 diabetes mellitus with diabetic autonomic (poly)neuropathy: Secondary | ICD-10-CM | POA: Insufficient documentation

## 2020-07-07 DIAGNOSIS — Z79899 Other long term (current) drug therapy: Secondary | ICD-10-CM | POA: Diagnosis not present

## 2020-07-07 DIAGNOSIS — K3184 Gastroparesis: Secondary | ICD-10-CM | POA: Diagnosis not present

## 2020-07-07 DIAGNOSIS — E785 Hyperlipidemia, unspecified: Secondary | ICD-10-CM | POA: Insufficient documentation

## 2020-07-07 DIAGNOSIS — Z794 Long term (current) use of insulin: Secondary | ICD-10-CM | POA: Insufficient documentation

## 2020-07-07 DIAGNOSIS — I10 Essential (primary) hypertension: Secondary | ICD-10-CM | POA: Diagnosis not present

## 2020-07-07 LAB — POCT GLYCOSYLATED HEMOGLOBIN (HGB A1C): HbA1c, POC (controlled diabetic range): 9.8 % — AB (ref 0.0–7.0)

## 2020-07-07 LAB — GLUCOSE, POCT (MANUAL RESULT ENTRY): POC Glucose: 283 mg/dl — AB (ref 70–99)

## 2020-07-07 MED ORDER — ATORVASTATIN CALCIUM 20 MG PO TABS: 20.0000 mg | ORAL_TABLET | Freq: Every day | ORAL | 3 refills | Status: DC

## 2020-07-07 MED ORDER — HYDROXYZINE HCL 25 MG PO TABS: 25.0000 mg | ORAL_TABLET | Freq: Three times a day (TID) | ORAL | 3 refills | Status: DC | PRN

## 2020-07-07 MED ORDER — CARVEDILOL 3.125 MG PO TABS: 3.1250 mg | ORAL_TABLET | Freq: Two times a day (BID) | ORAL | 3 refills | Status: DC

## 2020-07-07 MED ORDER — METOCLOPRAMIDE HCL 5 MG PO TABS: 5.0000 mg | ORAL_TABLET | Freq: Three times a day (TID) | ORAL | 3 refills | Status: DC

## 2020-07-07 MED ORDER — INSULIN GLARGINE 100 UNIT/ML ~~LOC~~ SOLN: 25.0000 [IU] | Freq: Every day | SUBCUTANEOUS | 2 refills | Status: DC

## 2020-07-07 MED FILL — hydrOXYzine HCL 25 MG TABS: 25 | 10 days supply | Qty: 30 | Fill #0

## 2020-07-07 MED FILL — ATORVASTATIN CALCIUM 20 MG: 20 | 30 days supply | Qty: 30 | Fill #0

## 2020-07-07 MED FILL — CARVEDILOL 3.125 MG TABLET: 3.125 | 30 days supply | Qty: 60 | Fill #0

## 2020-07-07 MED FILL — LANTUS 100 UNITS/ML VIAL: 100 | 28 days supply | Qty: 10 | Fill #0

## 2020-07-07 MED FILL — METOCLOPRAMIDE 5 MG TABLET: 5 | 30 days supply | Qty: 90 | Fill #0

## 2020-07-07 NOTE — Patient Instructions (Signed)

## 2020-07-07 NOTE — Progress Notes (Signed)
States that she is having trouble with her anxiety.  States that taking Librium helped, but only got 6 pills.  She will establish with [facility]inger center next week.

## 2020-07-07 NOTE — Progress Notes (Signed)
Subjective:  Patient ID: Madison Williams, female    DOB: 1985/08/07  Age: 34 y.o. MRN: [id number]  CC: Diabetes   HPI Madison Williams  is a 34 year old female with history ofypertension, type 1 diabetes mellitus (A1c 10.7), rheumatoid arthritis, alcohol abuse, Anxiety here for chronic disease management. She has done therapy in the past for her anxiety. She is on her 6th job due to her anxiety and has used alcohol to cope this past month. While she was much younger she had tried Ativan, Klonopin, Zoloft, Prozac, Paxil. She received Librium from the ED where she had presented with anxiety but notes indicate alcohol abuse and Librium was beneficial.  She has anxiety when driving.  Previously lived in a small town in [place] but has been in [place] for the last 2 years and this is bigger than what she is used to. [facility].  She endorses compliance with her Lantus and Humalog Highest blood sugar is in the high 300s and fasting 100-180 She has persistent nausea and is on Promethazine chronically as she feels hungry all the time and when she eats too much then she gets sick Past Medical History:  Diagnosis Date  . Asthma   . Diabetes mellitus without complication (HCC)   . Rheumatoid arthritis [facility])     Past Surgical History:  Procedure Laterality Date  . LAPAROSCOPIC OVARIAN    . LEFT HEART CATH AND CORONARY ANGIOGRAPHY N/A 11/24/2019   Procedure: LEFT HEART CATH AND CORONARY ANGIOGRAPHY;  Surgeon: Kathleene Hazel, MD;  Location: [facility];  Service: Cardiovascular;  Laterality: N/A;    No family history on file.  No Known Allergies  Outpatient Medications Prior to Visit  Medication Sig Dispense Refill  . albuterol (VENTOLIN HFA) 108 (90 Base) MCG/ACT inhaler Inhale 2 puffs into the lungs every 6 (six) hours as needed for wheezing or shortness of breath. 18 g 1  . bismuth subsalicylate (PEPTO BISMOL) 262  MG/15ML suspension Take 30 mLs by mouth every 6 (six) hours as needed for indigestion or diarrhea or loose stools.    Marland Kitchen HUMALOG 100 UNIT/ML injection Inject 5-10 Units into the skin 3 (three) times daily with meals. Sliding scale.    Marland Kitchen ibuprofen (ADVIL) 200 MG tablet Take 200 mg by mouth every 6 (six) hours as needed for moderate pain.    . Insulin Syringes, Disposable, U-100 0.5 ML MISC 1 each by Does not apply route 4 (four) times daily - after meals and at bedtime. 100 each 5  . ondansetron (ZOFRAN ODT) 4 MG disintegrating tablet Take 1 tablet (4 mg total) by mouth every 8 (eight) hours as needed for nausea or vomiting. 20 tablet 0  . OVER THE COUNTER MEDICATION Take 10 g by mouth daily. Kratom (Used for withdrawal/cease cravings)    . pantoprazole (PROTONIX) 40 MG tablet Take 1 tablet (40 mg total) by mouth 2 (two) times daily before a meal. (Patient taking differently: Take 40 mg by mouth See admin instructions. Takes 40 mg 2 times a week as needed for acid reflux) 60 tablet 1  . carvedilol (COREG) 3.125 MG tablet Take 1 tablet (3.125 mg total) by mouth 2 (two) times daily with a meal. 60 tablet 2  . insulin glargine (LANTUS) 100 UNIT/ML injection Inject 0.2 mLs (20 Units total) into the skin daily. 10 mL 2  . promethazine (PHENERGAN) 25 MG tablet Take 1 tablet (25 mg total) by mouth every  6 (six) hours as needed for nausea or vomiting. 30 tablet 2  . aspirin EC 81 MG EC tablet Take 1 tablet (81 mg total) by mouth daily. (Patient not taking: Reported on 06/28/2020) 30 tablet 0  . insulin aspart (NOVOLOG) 100 UNIT/ML injection Inject 0-9 Units into the skin 3 (three) times daily with meals. By sliding scale (Patient not taking: Reported on 06/28/2020) 10 mL 11  . meloxicam (MOBIC) 7.5 MG tablet Take 1 tablet (7.5 mg total) by mouth daily. (Patient not taking: Reported on 06/28/2020) 30 tablet 1  . nicotine (NICODERM CQ - DOSED IN MG/24 HOURS) 14 mg/24hr patch Place 1 patch (14 mg total) onto the  skin daily. (Patient not taking: Reported on 01/14/2020) 28 patch 0  . amLODipine (NORVASC) 5 MG tablet Take 5 mg by mouth daily. (Patient not taking: Reported on 07/07/2020)    . chlordiazePOXIDE (LIBRIUM) 25 MG capsule 25mg  PO TID x 1D, then 25mg  PO BID X 1D, then 25mg  PO QD X 1D (Patient not taking: Reported on 07/07/2020) 6 capsule 0  . polyethylene glycol (MIRALAX / GLYCOLAX) 17 g packet Take 17 g by mouth daily as needed for moderate constipation. (Patient not taking: Reported on 06/28/2020) 14 each 0  . potassium chloride 20 MEQ/15ML (10%) SOLN Take 30 mLs (40 mEq total) by mouth daily. (Patient not taking: Reported on 04/06/2020) 473 mL 1   No facility-administered medications prior to visit.     ROS Review of Systems  Constitutional: Negative for activity change, appetite change and fatigue.  HENT: Negative for congestion, sinus pressure and sore throat.   Eyes: Negative for visual disturbance.  Respiratory: Negative for cough, chest tightness, shortness of breath and wheezing.   Cardiovascular: Negative for chest pain and palpitations.  Gastrointestinal: Positive for nausea. Negative for abdominal distention, abdominal pain and constipation.  Endocrine: Negative for polydipsia.  Genitourinary: Negative for dysuria and frequency.  Musculoskeletal: Negative for arthralgias and back pain.  Skin: Negative for rash.  Neurological: Negative for tremors, light-headedness and numbness.  Hematological: Does not bruise/bleed easily.  Psychiatric/Behavioral: Negative for agitation and behavioral problems.       Positive for anxiety    Objective:  BP 125/75   Pulse (!) 112   Ht 5\' 7"  (1.702 m)   Wt 122 lb 6.4 oz (55.5 kg)   LMP 03/16/2018 Comment: negative urine preg test 01/14/20  SpO2 98%   BMI 19.17 kg/m   BP/Weight 07/07/2020 06/29/2020 06/28/2020  Systolic BP 125 94 -  Diastolic BP 75 66 -  Wt. (Lbs) 122.4 - 120  BMI 19.17 - 18.79      Physical Exam Constitutional:       Appearance: She is well-developed.  Neck:     Vascular: No JVD.  Cardiovascular:     Rate and Rhythm: Tachycardia present.     Heart sounds: Normal heart sounds. No murmur heard.   Pulmonary:     Effort: Pulmonary effort is normal.     Breath sounds: Normal breath sounds. No wheezing or rales.  Chest:     Chest wall: No tenderness.  Abdominal:     General: Bowel sounds are normal. There is no distension.     Palpations: Abdomen is soft. There is no mass.     Tenderness: There is no abdominal tenderness.  Musculoskeletal:        General: Normal range of motion.     Right lower leg: No edema.     Left lower leg: No edema.  Neurological:     Mental Status: She is alert and oriented to person, place, and time.     Comments: Anxious  Psychiatric:        Mood and Affect: Mood normal.     CMP Latest Ref Rng & Units 06/28/2020 04/06/2020 02/05/2020  Glucose 70 - 99 mg/dL 161(W) 960(A) 540(J)  BUN 6 - 20 mg/dL 12 11 11   Creatinine 0.44 - 1.00 mg/dL 8.11 9.14  Sodium 135 - 145 mmol/L 137 132(L) 130(L)  Potassium 3.5 - 5.1 mmol/L 3.6 4.5 3.1(L)  Chloride 98 - 111 mmol/L 98 91(L) 84(L)  CO2 22 - 32 mmol/L 20(L) 24 20  Calcium 8.9 - 10.3 mg/dL 9.2 7.82 95.6)  Total Protein 6.0 - 8.5 g/dL - - 6.9  Total Bilirubin 0.0 - 1.2 mg/dL - - 0.3  Alkaline Phos 48 - 121 IU/L - - 110  AST 0 - 40 IU/L - - 133(H)  ALT 0 - 32 IU/L - - 123(H)    Lipid Panel     Component Value Date/Time   CHOL 275 (H) 02/05/2020 1152   TRIG 70 02/05/2020 1152   HDL 138 02/05/2020 1152   CHOLHDL 2.0 02/05/2020 1152   CHOLHDL 2.1 11/25/2019 0920   VLDL 16 11/25/2019 0920   LDLCALC 126 (H) 02/05/2020 1152    CBC    Component Value Date/Time   WBC 6.8 06/28/2020 1914   RBC 4.11 06/28/2020 1914   HGB 13.3 06/28/2020 1914   HCT 39.1 06/28/2020 1914   PLT 316 06/28/2020 1914   MCV 95.1 06/28/2020 1914   MCH 32.4 06/28/2020 1914   MCHC 34.0 06/28/2020 1914   RDW 13.2 06/28/2020 1914   LYMPHSABS 1.3  11/27/2019 0222   MONOABS 0.5 11/27/2019 0222   EOSABS 0.0 11/27/2019 0222   BASOSABS 0.0 11/27/2019 0222    Lab Results  Component Value Date   HGBA1C 9.8 (A) 07/07/2020    Assessment & Plan:  1. Type 1 diabetes mellitus with hyperglycemia (HCC) Uncontrolled with A1c of 9.8 Increase Lantus dose Counseled on Diabetic diet, my plate method, 14/02/2020 minutes of moderate intensity exercise/week Blood sugar logs with fasting goals of 80-120 mg/dl, random of less than 086 and in the event of sugars less than 60 mg/dl or greater than 578 mg/dl encouraged to notify the clinic. Advised on the need for annual eye exams, annual foot exams, Pneumonia vaccine. - POCT glucose (manual entry) - POCT glycosylated hemoglobin (Hb A1C) - insulin glargine (LANTUS) 100 UNIT/ML injection; Inject 0.25 mLs (25 Units total) into the skin daily.  Dispense: 10 mL; Refill: 2  2. Anxiety Uncontrolled Discussed coping mechanism Would love to have LCSW to meet with patient today however she is in up with another patient We will have her reach out to this patient to follow-up on her symptoms Hydroxyzine has been initiated and she will follow up with [facility] psychiatrist next week - hydrOXYzine (ATARAX/VISTARIL) 25 MG tablet; Take 1 tablet (25 mg total) by mouth 3 (three) times daily as needed.  Dispense: 30 tablet; Refill: 3  3. Gastroparesis Symptoms are suspicious for gastroparesis Discontinue promethazine and initiate Reglan - metoCLOPramide (REGLAN) 5 MG tablet; Take 1 tablet (5 mg total) by mouth 3 (three) times daily before meals.  Dispense: 90 tablet; Refill: 3  4. Hypertension complicating diabetes (HCC) Controlled - carvedilol (COREG) 3.125 MG tablet; Take 1 tablet (3.125 mg total) by mouth 2 (two) times daily with a meal.  Dispense: 60 tablet; Refill: 3  5. Hyperlipidemia associated with type 2 diabetes mellitus (HCC) Uncontrolled Placed on statin Low-cholesterol diet - atorvastatin (LIPITOR)  20 MG tablet; Take 1 tablet (20 mg total) by mouth daily.  Dispense: 30 tablet; Refill: 3    Meds ordered this encounter  Medications  . hydrOXYzine (ATARAX/VISTARIL) 25 MG tablet    Sig: Take 1 tablet (25 mg total) by mouth 3 (three) times daily as needed.    Dispense:  30 tablet    Refill:  3    Dose increase  . insulin glargine (LANTUS) 100 UNIT/ML injection    Sig: Inject 0.25 mLs (25 Units total) into the skin daily.    Dispense:  10 mL    Refill:  2  . carvedilol (COREG) 3.125 MG tablet    Sig: Take 1 tablet (3.125 mg total) by mouth 2 (two) times daily with a meal.    Dispense:  60 tablet    Refill:  3  . metoCLOPramide (REGLAN) 5 MG tablet    Sig: Take 1 tablet (5 mg total) by mouth 3 (three) times daily before meals.    Dispense:  90 tablet    Refill:  3  . atorvastatin (LIPITOR) 20 MG tablet    Sig: Take 1 tablet (20 mg total) by mouth daily.    Dispense:  30 tablet    Refill:  3    Follow-up: Return in about 3 months (around 10/05/2020) for Chronic disease management.       Hoy Register, MD, FAAFP. [facility] [place], [place] [phone]   07/07/2020, 5:41 PM

## 2020-09-24 MED FILL — HumaLOG 100 UNIT/ML SOLN: 100 | 28 days supply | Qty: 10 | Fill #2

## 2020-09-24 MED FILL — PROMETHAZINE HCL 25 MG TABS: 25 | 7 days supply | Qty: 30 | Fill #2

## 2020-09-24 MED FILL — LANTUS 100 UNITS/ML VIAL: 100 | 28 days supply | Qty: 10 | Fill #1

## 2020-10-05 ENCOUNTER — Ambulatory Visit: Payer: Medicaid Other | Admitting: Family Medicine

## 2020-11-24 ENCOUNTER — Other Ambulatory Visit: Payer: Self-pay

## 2020-11-24 MED FILL — Insulin Lispro Subcutaneous Soln 100 Unit/ML: SUBCUTANEOUS | 28 days supply | Qty: 10 | Fill #0 | Status: AC

## 2020-11-24 MED FILL — Insulin Glargine Inj 100 Unit/ML: SUBCUTANEOUS | 28 days supply | Qty: 10 | Fill #0 | Status: AC

## 2020-11-25 ENCOUNTER — Other Ambulatory Visit: Payer: Self-pay

## 2020-11-29 ENCOUNTER — Other Ambulatory Visit: Payer: Self-pay

## 2020-11-29 MED ORDER — INSULIN LISPRO 100 UNIT/ML IJ SOLN: INTRAMUSCULAR | 11 refills | Status: DC

## 2020-11-30 ENCOUNTER — Other Ambulatory Visit: Payer: Self-pay

## 2020-11-30 MED ORDER — INSULIN LISPRO 100 UNIT/ML IJ SOLN
0.0000 [IU] | Freq: Three times a day (TID) | INTRAMUSCULAR | 0 refills | Status: DC
Start: 2020-02-05 — End: 2021-01-18
  Filled 2021-01-12: qty 10, 28d supply, fill #0

## 2020-12-01 ENCOUNTER — Other Ambulatory Visit: Payer: Self-pay

## 2020-12-17 LAB — HM DIABETES EYE EXAM

## 2021-01-10 ENCOUNTER — Other Ambulatory Visit: Payer: Self-pay | Admitting: Family Medicine

## 2021-01-10 ENCOUNTER — Other Ambulatory Visit: Payer: Self-pay

## 2021-01-10 MED ORDER — INSULIN GLARGINE 100 UNIT/ML ~~LOC~~ SOLN
25.0000 [IU] | Freq: Every day | SUBCUTANEOUS | 0 refills | Status: DC
  Filled 2021-01-10: qty 10, 28d supply, fill #0

## 2021-01-10 NOTE — Telephone Encounter (Signed)
   Future visit scheduled: yes   Notes to clinic: Patient has appt on 03/14/2021 Review for enough medication until that appt    Requested Prescriptions  Pending Prescriptions Disp Refills   insulin glargine (LANTUS) 100 UNIT/ML injection 10 mL 2    Sig: INJECT 0.2 MLS (20 UNITS TOTAL) INTO THE SKIN DAILY.      Endocrinology:  Diabetes - Insulins Failed - 01/10/2021  8:45 AM      Failed - HBA1C is between 0 and 7.9 and within 180 days    HbA1c, POC (controlled diabetic range)  Date Value Ref Range Status  07/07/2020 9.8 (A) 0.0 - 7.0 % Final          Failed - Valid encounter within last 6 months    Recent Outpatient Visits           6 months ago Type 1 diabetes mellitus with hyperglycemia (HCC)   [facility] [place], [place], [place]   9 months ago Type 1 diabetes mellitus with hyperglycemia [facility])   [facility] [place], Odette Horns, MD   10 months ago Type 1 diabetes mellitus with hyperglycemia [facility])   [facility] [place], Jeannett Senior L, RPH-CPP   11 months ago Type 1 diabetes mellitus with hyperglycemia [facility])   [facility] [place], Marzella Schlein, [place]       Future Appointments             In 2 months Hoy Register, MD [facility]

## 2021-01-10 NOTE — Telephone Encounter (Signed)
Copied from CRM [id number]. Topic: Quick Communication - Rx Refill/Question >> Jan 10, 2021  8:42 AM Jaquita Rector A wrote: Medication: insulin glargine (LANTUS) 100 UNIT/ML injection    Has the patient contacted their pharmacy? No. (Agent: If no, request that the patient contact the pharmacy for the refill.) (Agent: If yes, when and what did the pharmacy advise?)  Preferred Pharmacy (with phone number or street name): [facility]  Phone:  [phone] Fax:  [phone]     Agent: Please be advised that RX refills may take up to 3 business days. We ask that you follow-up with your pharmacy.

## 2021-01-12 ENCOUNTER — Other Ambulatory Visit: Payer: Self-pay

## 2021-01-15 ENCOUNTER — Emergency Department (HOSPITAL_COMMUNITY): Payer: Medicaid Other

## 2021-01-15 ENCOUNTER — Other Ambulatory Visit: Payer: Self-pay

## 2021-01-15 ENCOUNTER — Inpatient Hospital Stay (HOSPITAL_COMMUNITY)
Admission: EM | Admit: 2021-01-15 | Discharge: 2021-01-18 | DRG: 638 | Disposition: A | Discharge status: Home / Self Care | Payer: Medicaid Other | Attending: Family Medicine | Admitting: Family Medicine

## 2021-01-15 ENCOUNTER — Encounter (HOSPITAL_COMMUNITY): Payer: Self-pay

## 2021-01-15 DIAGNOSIS — E785 Hyperlipidemia, unspecified: Secondary | ICD-10-CM | POA: Diagnosis present

## 2021-01-15 DIAGNOSIS — E1010 Type 1 diabetes mellitus with ketoacidosis without coma: Principal | ICD-10-CM | POA: Diagnosis present

## 2021-01-15 DIAGNOSIS — M069 Rheumatoid arthritis, unspecified: Secondary | ICD-10-CM | POA: Diagnosis present

## 2021-01-15 DIAGNOSIS — K3184 Gastroparesis: Secondary | ICD-10-CM | POA: Diagnosis present

## 2021-01-15 DIAGNOSIS — Z87891 Personal history of nicotine dependence: Secondary | ICD-10-CM

## 2021-01-15 DIAGNOSIS — F1123 Opioid dependence with withdrawal: Secondary | ICD-10-CM | POA: Diagnosis present

## 2021-01-15 DIAGNOSIS — Z91138 Patient's unintentional underdosing of medication regimen for other reason: Secondary | ICD-10-CM | POA: Diagnosis not present

## 2021-01-15 DIAGNOSIS — E876 Hypokalemia: Secondary | ICD-10-CM | POA: Diagnosis present

## 2021-01-15 DIAGNOSIS — R636 Underweight: Secondary | ICD-10-CM | POA: Diagnosis present

## 2021-01-15 DIAGNOSIS — Z681 Body mass index (BMI) 19 or less, adult: Secondary | ICD-10-CM | POA: Diagnosis not present

## 2021-01-15 DIAGNOSIS — I10 Essential (primary) hypertension: Secondary | ICD-10-CM | POA: Diagnosis present

## 2021-01-15 DIAGNOSIS — Z794 Long term (current) use of insulin: Secondary | ICD-10-CM | POA: Diagnosis not present

## 2021-01-15 DIAGNOSIS — Z79899 Other long term (current) drug therapy: Secondary | ICD-10-CM | POA: Diagnosis not present

## 2021-01-15 DIAGNOSIS — E1110 Type 2 diabetes mellitus with ketoacidosis without coma: Secondary | ICD-10-CM | POA: Diagnosis present

## 2021-01-15 DIAGNOSIS — T383X6A Underdosing of insulin and oral hypoglycemic [antidiabetic] drugs, initial encounter: Secondary | ICD-10-CM | POA: Diagnosis present

## 2021-01-15 DIAGNOSIS — Z20822 Contact with and (suspected) exposure to covid-19: Secondary | ICD-10-CM | POA: Diagnosis present

## 2021-01-15 DIAGNOSIS — E1043 Type 1 diabetes mellitus with diabetic autonomic (poly)neuropathy: Secondary | ICD-10-CM | POA: Diagnosis present

## 2021-01-15 LAB — GLUCOSE, CAPILLARY
Glucose-Capillary: 151 mg/dL — ABNORMAL HIGH (ref 70–99)
Glucose-Capillary: 202 mg/dL — ABNORMAL HIGH (ref 70–99)
Glucose-Capillary: 224 mg/dL — ABNORMAL HIGH (ref 70–99)
Glucose-Capillary: 242 mg/dL — ABNORMAL HIGH (ref 70–99)
Glucose-Capillary: 249 mg/dL — ABNORMAL HIGH (ref 70–99)
Glucose-Capillary: 249 mg/dL — ABNORMAL HIGH (ref 70–99)
Glucose-Capillary: 263 mg/dL — ABNORMAL HIGH (ref 70–99)

## 2021-01-15 LAB — COMPREHENSIVE METABOLIC PANEL
ALT: 17 U/L (ref 0–44)
AST: 12 U/L — ABNORMAL LOW (ref 15–41)
Albumin: 4.2 g/dL (ref 3.5–5.0)
Alkaline Phosphatase: 69 U/L (ref 38–126)
Anion gap: 16 — ABNORMAL HIGH (ref 5–15)
BUN: 22 mg/dL — ABNORMAL HIGH (ref 6–20)
CO2: 20 mmol/L — ABNORMAL LOW (ref 22–32)
Calcium: 9.8 mg/dL (ref 8.9–10.3)
Chloride: 102 mmol/L (ref 98–111)
Creatinine, Ser: 1.15 mg/dL — ABNORMAL HIGH (ref 0.44–1.00)
GFR, Estimated: >60 mL/min (ref >60)
Glucose, Bld: 210 mg/dL — ABNORMAL HIGH (ref 70–99)
Potassium: 3.5 mmol/L (ref 3.5–5.1)
Sodium: 138 mmol/L (ref 135–145)
Total Bilirubin: 2.3 mg/dL — ABNORMAL HIGH (ref 0.3–1.2)
Total Protein: 7.2 g/dL (ref 6.5–8.1)

## 2021-01-15 LAB — BASIC METABOLIC PANEL
Anion gap: 30 — ABNORMAL HIGH (ref 5–15)
Anion gap: 22 — ABNORMAL HIGH (ref 5–15)
BUN: 29 mg/dL — ABNORMAL HIGH (ref 6–20)
BUN: 23 mg/dL — ABNORMAL HIGH (ref 6–20)
CO2: 10 mmol/L — ABNORMAL LOW (ref 22–32)
CO2: 13 mmol/L — ABNORMAL LOW (ref 22–32)
Calcium: 10.1 mg/dL (ref 8.9–10.3)
Calcium: 9.6 mg/dL (ref 8.9–10.3)
Chloride: 95 mmol/L — ABNORMAL LOW (ref 98–111)
Chloride: 102 mmol/L (ref 98–111)
Creatinine, Ser: 1.75 mg/dL — ABNORMAL HIGH (ref 0.44–1.00)
Creatinine, Ser: 1.3 mg/dL — ABNORMAL HIGH (ref 0.44–1.00)
GFR, Estimated: 39 mL/min — ABNORMAL LOW (ref >60)
GFR, Estimated: 55 mL/min — ABNORMAL LOW (ref >60)
Glucose, Bld: 416 mg/dL — ABNORMAL HIGH (ref 70–99)
Glucose, Bld: 209 mg/dL — ABNORMAL HIGH (ref 70–99)
Potassium: 4.3 mmol/L (ref 3.5–5.1)
Potassium: 4.1 mmol/L (ref 3.5–5.1)
Sodium: 135 mmol/L (ref 135–145)
Sodium: 137 mmol/L (ref 135–145)

## 2021-01-15 LAB — I-STAT VENOUS BLOOD GAS, ED
Acid-base deficit: 12 mmol/L — ABNORMAL HIGH (ref 0.0–2.0)
Bicarbonate: 12.1 mmol/L — ABNORMAL LOW (ref 20.0–28.0)
Calcium, Ion: 1.14 mmol/L — ABNORMAL LOW (ref 1.15–1.40)
HCT: 48 % — ABNORMAL HIGH (ref 36.0–46.0)
Hemoglobin: 16.3 g/dL — ABNORMAL HIGH (ref 12.0–15.0)
O2 Saturation: 74 %
Potassium: 4.3 mmol/L (ref 3.5–5.1)
Sodium: 133 mmol/L — ABNORMAL LOW (ref 135–145)
TCO2: 13 mmol/L — ABNORMAL LOW (ref 22–32)
pCO2, Ven: 23.5 mmHg — ABNORMAL LOW (ref 44.0–60.0)
pH, Ven: 7.317 (ref 7.250–7.430)
pO2, Ven: 41 mmHg (ref 32.0–45.0)

## 2021-01-15 LAB — CBC WITH DIFFERENTIAL/PLATELET
Abs Immature Granulocytes: 0.09 10*3/uL — ABNORMAL HIGH (ref 0.00–0.07)
Basophils Absolute: 0 10*3/uL (ref 0.0–0.1)
Basophils Relative: 0 %
Eosinophils Absolute: 0 10*3/uL (ref 0.0–0.5)
Eosinophils Relative: 0 %
HCT: 48.5 % — ABNORMAL HIGH (ref 36.0–46.0)
Hemoglobin: 14.9 g/dL (ref 12.0–15.0)
Immature Granulocytes: 1 %
Lymphocytes Relative: 11 %
Lymphs Abs: 1.5 10*3/uL (ref 0.7–4.0)
MCH: 30.2 pg (ref 26.0–34.0)
MCHC: 30.7 g/dL (ref 30.0–36.0)
MCV: 98.4 fL (ref 80.0–100.0)
Monocytes Absolute: 1 10*3/uL (ref 0.1–1.0)
Monocytes Relative: 7 %
Neutro Abs: 11.8 10*3/uL — ABNORMAL HIGH (ref 1.7–7.7)
Neutrophils Relative %: 81 %
Platelets: 317 10*3/uL (ref 150–400)
RBC: 4.93 MIL/uL (ref 3.87–5.11)
RDW: 14.6 % (ref 11.5–15.5)
WBC: 14.5 10*3/uL — ABNORMAL HIGH (ref 4.0–10.5)
nRBC: 0 % (ref 0.0–0.2)

## 2021-01-15 LAB — HEPATIC FUNCTION PANEL
ALT: 18 U/L (ref 0–44)
AST: 13 U/L — ABNORMAL LOW (ref 15–41)
Albumin: 4.7 g/dL (ref 3.5–5.0)
Alkaline Phosphatase: 75 U/L (ref 38–126)
Bilirubin, Direct: 0.2 mg/dL (ref 0.0–0.2)
Indirect Bilirubin: 3.3 mg/dL — ABNORMAL HIGH (ref 0.3–0.9)
Total Bilirubin: 3.5 mg/dL — ABNORMAL HIGH (ref 0.3–1.2)
Total Protein: 8.2 g/dL — ABNORMAL HIGH (ref 6.5–8.1)

## 2021-01-15 LAB — CBG MONITORING, ED
Glucose-Capillary: 350 mg/dL — ABNORMAL HIGH (ref 70–99)
Glucose-Capillary: 534 mg/dL (ref 70–99)
Glucose-Capillary: 400 mg/dL — ABNORMAL HIGH (ref 70–99)
Glucose-Capillary: 288 mg/dL — ABNORMAL HIGH (ref 70–99)
Glucose-Capillary: 283 mg/dL — ABNORMAL HIGH (ref 70–99)
Glucose-Capillary: 194 mg/dL — ABNORMAL HIGH (ref 70–99)

## 2021-01-15 LAB — I-STAT CHEM 8, ED
BUN: 28 mg/dL — ABNORMAL HIGH (ref 6–20)
Calcium, Ion: 1.2 mmol/L (ref 1.15–1.40)
Chloride: 102 mmol/L (ref 98–111)
Creatinine, Ser: 0.9 mg/dL (ref 0.44–1.00)
Glucose, Bld: 421 mg/dL — ABNORMAL HIGH (ref 70–99)
HCT: 48 % — ABNORMAL HIGH (ref 36.0–46.0)
Hemoglobin: 16.3 g/dL — ABNORMAL HIGH (ref 12.0–15.0)
Potassium: 4.3 mmol/L (ref 3.5–5.1)
Sodium: 134 mmol/L — ABNORMAL LOW (ref 135–145)
TCO2: 13 mmol/L — ABNORMAL LOW (ref 22–32)

## 2021-01-15 LAB — CBC
HCT: 39.3 % (ref 36.0–46.0)
Hemoglobin: 13.4 g/dL (ref 12.0–15.0)
MCH: 30.4 pg (ref 26.0–34.0)
MCHC: 34.1 g/dL (ref 30.0–36.0)
MCV: 89.1 fL (ref 80.0–100.0)
Platelets: 290 10*3/uL (ref 150–400)
RBC: 4.41 MIL/uL (ref 3.87–5.11)
RDW: 14.5 % (ref 11.5–15.5)
WBC: 15.3 10*3/uL — ABNORMAL HIGH (ref 4.0–10.5)
nRBC: 0 % (ref 0.0–0.2)

## 2021-01-15 LAB — I-STAT BETA HCG BLOOD, ED (MC, WL, AP ONLY): I-stat hCG, quantitative: <5 m[IU]/mL (ref <5)

## 2021-01-15 LAB — PHOSPHORUS: Phosphorus: 2.4 mg/dL — ABNORMAL LOW (ref 2.5–4.6)

## 2021-01-15 LAB — RESP PANEL BY RT-PCR (FLU A&B, COVID) ARPGX2
Influenza A by PCR: NEGATIVE
Influenza B by PCR: NEGATIVE
SARS Coronavirus 2 by RT PCR: NEGATIVE

## 2021-01-15 LAB — HIV ANTIBODY (ROUTINE TESTING W REFLEX): HIV Screen 4th Generation wRfx: NONREACTIVE

## 2021-01-15 LAB — BETA-HYDROXYBUTYRIC ACID
Beta-Hydroxybutyric Acid: >8 mmol/L — ABNORMAL HIGH (ref 0.05–0.27)
Beta-Hydroxybutyric Acid: 3.6 mmol/L — ABNORMAL HIGH (ref 0.05–0.27)

## 2021-01-15 LAB — MAGNESIUM: Magnesium: 2.1 mg/dL (ref 1.7–2.4)

## 2021-01-15 MED ORDER — CARVEDILOL 3.125 MG PO TABS
3.1250 mg | ORAL_TABLET | Freq: Two times a day (BID) | ORAL | Status: DC
  Administered 2021-01-15 – 2021-01-18 (×6): 3.125 mg via ORAL
  Filled 2021-01-15 (×8): qty 1

## 2021-01-15 MED ORDER — FOLIC ACID 1 MG PO TABS: 1.0000 mg | ORAL_TABLET | Freq: Every day | ORAL | Status: DC

## 2021-01-15 MED ORDER — HYDROMORPHONE HCL 1 MG/ML IJ SOLN
1.0000 mg | Freq: Once | INTRAMUSCULAR | Status: AC
  Administered 2021-01-15: 1 mg via INTRAVENOUS
  Filled 2021-01-15: qty 1

## 2021-01-15 MED ORDER — LACTATED RINGERS IV SOLN: INTRAVENOUS | Status: DC

## 2021-01-15 MED ORDER — LORAZEPAM 2 MG/ML IJ SOLN
1.0000 mg | INTRAMUSCULAR | Status: DC | PRN
Start: 2021-01-15 — End: 2021-01-18
  Administered 2021-01-15: 3 mg via INTRAVENOUS
  Administered 2021-01-16 – 2021-01-17 (×2): 1 mg via INTRAVENOUS
  Filled 2021-01-15: qty 1
  Filled 2021-01-15: qty 2
  Filled 2021-01-15: qty 1

## 2021-01-15 MED ORDER — ATORVASTATIN CALCIUM 10 MG PO TABS
20.0000 mg | ORAL_TABLET | Freq: Every day | ORAL | Status: DC
  Administered 2021-01-15: 20 mg via ORAL
  Filled 2021-01-15 (×2): qty 2

## 2021-01-15 MED ORDER — ENOXAPARIN SODIUM 40 MG/0.4ML IJ SOSY
40.0000 mg | PREFILLED_SYRINGE | INTRAMUSCULAR | Status: DC
  Administered 2021-01-15 – 2021-01-18 (×4): 40 mg via SUBCUTANEOUS
  Filled 2021-01-15 (×4): qty 0.4

## 2021-01-15 MED ORDER — CLONIDINE HCL 0.1 MG/24HR TD PTWK: 0.1000 mg | MEDICATED_PATCH | TRANSDERMAL | Status: DC

## 2021-01-15 MED ORDER — METOCLOPRAMIDE HCL 5 MG PO TABS
5.0000 mg | ORAL_TABLET | Freq: Three times a day (TID) | ORAL | Status: DC
  Filled 2021-01-15: qty 1

## 2021-01-15 MED ORDER — IBUPROFEN 200 MG PO TABS
200.0000 mg | ORAL_TABLET | Freq: Four times a day (QID) | ORAL | Status: DC | PRN
  Administered 2021-01-15: 200 mg via ORAL
  Filled 2021-01-15 (×2): qty 1

## 2021-01-15 MED ORDER — MELOXICAM 7.5 MG PO TABS
7.5000 mg | ORAL_TABLET | Freq: Every day | ORAL | Status: DC
  Administered 2021-01-16: 7.5 mg via ORAL
  Filled 2021-01-15: qty 1

## 2021-01-15 MED ORDER — FOLIC ACID 5 MG/ML IJ SOLN
1.0000 mg | Freq: Every day | INTRAMUSCULAR | Status: DC
  Administered 2021-01-15 – 2021-01-16 (×2): 1 mg via INTRAVENOUS
  Filled 2021-01-15 (×3): qty 0.2

## 2021-01-15 MED ORDER — DEXTROSE IN LACTATED RINGERS 5 % IV SOLN: INTRAVENOUS | Status: DC

## 2021-01-15 MED ORDER — POTASSIUM CHLORIDE 10 MEQ/100ML IV SOLN
10.0000 meq | INTRAVENOUS | Status: AC
  Administered 2021-01-15 (×2): 10 meq via INTRAVENOUS
  Filled 2021-01-15 (×2): qty 100

## 2021-01-15 MED ORDER — BISMUTH SUBSALICYLATE 262 MG PO CHEW
524.0000 mg | CHEWABLE_TABLET | Freq: Four times a day (QID) | ORAL | Status: DC | PRN
  Filled 2021-01-15: qty 2

## 2021-01-15 MED ORDER — ADULT MULTIVITAMIN W/MINERALS CH
1.0000 | ORAL_TABLET | Freq: Every day | ORAL | Status: DC
  Administered 2021-01-16 – 2021-01-18 (×3): 1 via ORAL
  Filled 2021-01-15 (×3): qty 1

## 2021-01-15 MED ORDER — NICOTINE 14 MG/24HR TD PT24
14.0000 mg | MEDICATED_PATCH | TRANSDERMAL | Status: DC
  Filled 2021-01-15 (×2): qty 1

## 2021-01-15 MED ORDER — HYDROXYZINE HCL 25 MG PO TABS
25.0000 mg | ORAL_TABLET | Freq: Three times a day (TID) | ORAL | Status: DC | PRN
  Administered 2021-01-15: 25 mg via ORAL
  Filled 2021-01-15 (×2): qty 1

## 2021-01-15 MED ORDER — BISMUTH SUBSALICYLATE 262 MG/15ML PO SUSP
30.0000 mL | Freq: Four times a day (QID) | ORAL | Status: DC | PRN
  Filled 2021-01-15: qty 236

## 2021-01-15 MED ORDER — PANTOPRAZOLE SODIUM 40 MG PO TBEC
40.0000 mg | DELAYED_RELEASE_TABLET | Freq: Two times a day (BID) | ORAL | Status: DC
  Administered 2021-01-16: 40 mg via ORAL
  Filled 2021-01-15 (×2): qty 1

## 2021-01-15 MED ORDER — THIAMINE HCL 100 MG/ML IJ SOLN
100.0000 mg | Freq: Every day | INTRAMUSCULAR | Status: DC
  Administered 2021-01-15 – 2021-01-16 (×2): 100 mg via INTRAVENOUS
  Filled 2021-01-15 (×3): qty 2

## 2021-01-15 MED ORDER — POTASSIUM CHLORIDE 10 MEQ/100ML IV SOLN
10.0000 meq | INTRAVENOUS | Status: DC
Start: 2021-01-15 — End: 2021-01-15

## 2021-01-15 MED ORDER — METOCLOPRAMIDE HCL 5 MG/ML IJ SOLN
10.0000 mg | Freq: Three times a day (TID) | INTRAMUSCULAR | Status: DC
  Administered 2021-01-15 – 2021-01-18 (×9): 10 mg via INTRAVENOUS
  Filled 2021-01-15 (×9): qty 2

## 2021-01-15 MED ORDER — THIAMINE HCL 100 MG PO TABS
100.0000 mg | ORAL_TABLET | Freq: Every day | ORAL | Status: DC
  Administered 2021-01-16 – 2021-01-18 (×3): 100 mg via ORAL
  Filled 2021-01-15 (×3): qty 1

## 2021-01-15 MED ORDER — LORAZEPAM 1 MG PO TABS
1.0000 mg | ORAL_TABLET | ORAL | Status: DC | PRN
  Administered 2021-01-16 (×2): 1 mg via ORAL
  Filled 2021-01-15 (×2): qty 1

## 2021-01-15 MED ORDER — ONDANSETRON 4 MG PO TBDP
8.0000 mg | ORAL_TABLET | Freq: Once | ORAL | Status: AC
Start: 2021-01-15 — End: 2021-01-15
  Administered 2021-01-15: 8 mg via ORAL
  Filled 2021-01-15: qty 2

## 2021-01-15 MED ORDER — SODIUM CHLORIDE 0.9 % IV SOLN
25.0000 mg | Freq: Four times a day (QID) | INTRAVENOUS | Status: DC | PRN
  Administered 2021-01-15: 25 mg via INTRAVENOUS
  Filled 2021-01-15 (×6): qty 1

## 2021-01-15 MED ORDER — INSULIN REGULAR(HUMAN) IN NACL 100-0.9 UT/100ML-% IV SOLN
INTRAVENOUS | Status: DC
Start: 2021-01-15 — End: 2021-01-16
  Administered 2021-01-15: 6 [IU]/h via INTRAVENOUS
  Filled 2021-01-15: qty 100

## 2021-01-15 MED ORDER — ASPIRIN 81 MG PO CHEW
81.0000 mg | CHEWABLE_TABLET | Freq: Every day | ORAL | Status: DC
  Administered 2021-01-15: 81 mg via ORAL
  Filled 2021-01-15 (×2): qty 1

## 2021-01-15 MED ORDER — SODIUM CHLORIDE 0.9 % IV BOLUS: 1000.0000 mL | Freq: Once | INTRAVENOUS | Status: DC

## 2021-01-15 MED ORDER — ALBUTEROL SULFATE (2.5 MG/3ML) 0.083% IN NEBU: 2.5000 mg | INHALATION_SOLUTION | Freq: Four times a day (QID) | RESPIRATORY_TRACT | Status: DC | PRN

## 2021-01-15 MED ORDER — LACTATED RINGERS IV BOLUS
20.0000 mL/kg | Freq: Once | INTRAVENOUS | Status: AC
  Administered 2021-01-15: 1110 mL via INTRAVENOUS

## 2021-01-15 MED ORDER — DEXTROSE 50 % IV SOLN
0.0000 mL | INTRAVENOUS | Status: DC | PRN
Start: 2021-01-15 — End: 2021-01-18

## 2021-01-15 NOTE — ED Provider Notes (Signed)
[facility] EMERGENCY DEPARTMENT Provider Note   CSN: [id number] Arrival date & time: 01/15/21  0931     History No chief complaint on file.   Madison Williams is a 35 y.o. female.  She also states that she has a kratom addiction and is experiencing withdrawals from not.  She has some Phenergan tablets at home to treat her nausea and vomiting, but she has been vomiting and unable to tolerate these.  The history is provided by the patient.  Hyperglycemia Blood sugar level PTA:  >400 Severity:  Severe Onset quality:  Gradual Duration:  4 days Timing:  Constant Progression:  Worsening Chronicity:  Recurrent Diabetes status:  Controlled with insulin Context: noncompliance   Context comment:  Out of long acting insulin x4 days; states has a prescription, can afford it, but "they won't fill it" Ineffective treatments:  None tried Associated symptoms: abdominal pain (epigastric), chest pain (heart burn), fatigue, increased thirst, nausea and vomiting   Associated symptoms: no dysuria, no fever and no shortness of breath       Past Medical History:  Diagnosis Date   Asthma    Diabetes mellitus without complication (HCC)    Rheumatoid arthritis (HCC)     Patient Active Problem List   Diagnosis Date Noted   Rheumatoid arthritis (HCC)    Substance withdrawal (HCC) 01/14/2020   SOB (shortness of breath)    Hypokalemia    Macrocytosis without anemia    Retroperitoneal hematoma    DKA (diabetic ketoacidosis) (HCC) 11/24/2019   Acute ST elevation myocardial infarction (STEMI) of anterolateral wall (HCC)    Abnormal EKG    Elevated troponin    Nausea & vomiting 10/16/2019   Abdominal pain 10/16/2019   Chest tightness    Mild intermittent asthma without complication    Acute vomiting 01/31/2019   Type 1 diabetes mellitus with hyperglycemia (HCC) 01/31/2019   Uninsured 01/31/2019   DKA (diabetic ketoacidoses) 03/30/2018   AKI (acute kidney injury)  (HCC) 03/30/2018   Leukocytosis 03/30/2018   Dehydration 03/30/2018   Tobacco use disorder 03/30/2018    Past Surgical History:  Procedure Laterality Date   LAPAROSCOPIC OVARIAN     LEFT HEART CATH AND CORONARY ANGIOGRAPHY N/A 11/24/2019   Procedure: LEFT HEART CATH AND CORONARY ANGIOGRAPHY;  Surgeon: Kathleene Hazel, MD;  Location: [facility] INVASIVE CV LAB;  Service: Cardiovascular;  Laterality: N/A;     OB History   No obstetric history on file.     No family history on file.  Social History   Tobacco Use   Smoking status: Former    Packs/day: 1.00    Pack years: 0.00    Types: Cigarettes   Smokeless tobacco: Never  Vaping Use   Vaping Use: Every day   Substances: Nicotine  Substance Use Topics   Alcohol use: Yes   Drug use: Yes    Comment: Kratum    Home Medications Prior to Admission medications   Medication Sig Start Date End Date Taking? Authorizing Provider  albuterol (VENTOLIN HFA) 108 (90 Base) MCG/ACT inhaler Inhale 2 puffs into the lungs every 6 (six) hours as needed for wheezing or shortness of breath. 02/05/20   Anders Simmonds, PA-C  aspirin EC 81 MG EC tablet Take 1 tablet (81 mg total) by mouth daily. Patient not taking: Reported on 06/28/2020 11/29/19   Edsel Petrin, DO  atorvastatin (LIPITOR) 20 MG tablet Take 1 tablet (20 mg total) by mouth daily. 07/07/20   Newlin, Odette Horns,  MD  atorvastatin (LIPITOR) 20 MG tablet TAKE 1 TABLET (20 MG TOTAL) BY MOUTH DAILY. 07/07/20 07/07/21  Hoy Register, MD  bismuth subsalicylate (PEPTO BISMOL) 262 MG/15ML suspension Take 30 mLs by mouth every 6 (six) hours as needed for indigestion or diarrhea or loose stools.    [provider]  carvedilol (COREG) 3.125 MG tablet Take 1 tablet (3.125 mg total) by mouth 2 (two) times daily with a meal. 07/07/20   Newlin, Enobong, MD  carvedilol (COREG) 3.125 MG tablet TAKE 1 TABLET (3.125 MG TOTAL) BY MOUTH 2 (TWO) TIMES DAILY WITH A MEAL. 07/07/20 07/07/21  Hoy Register, MD  HUMALOG 100 UNIT/ML injection Inject 5-10 Units into the skin 3 (three) times daily with meals. Sliding scale. 06/08/20   [provider]  hydrOXYzine (ATARAX/VISTARIL) 25 MG tablet Take 1 tablet (25 mg total) by mouth 3 (three) times daily as needed. 07/07/20   Hoy Register, MD  hydrOXYzine (ATARAX/VISTARIL) 25 MG tablet TAKE 1 TABLET (25 MG TOTAL) BY MOUTH 3 (THREE) TIMES DAILY AS NEEDED. 07/07/20 07/07/21  Hoy Register, MD  ibuprofen (ADVIL) 200 MG tablet Take 200 mg by mouth every 6 (six) hours as needed for moderate pain.    [provider]  insulin aspart (NOVOLOG) 100 UNIT/ML injection Inject 0-9 Units into the skin 3 (three) times daily with meals. By sliding scale Patient not taking: Reported on 06/28/2020 02/05/20   Anders Simmonds, PA-C  insulin glargine (LANTUS) 100 UNIT/ML injection Inject 0.25 mLs (25 Units total) into the skin daily. 01/10/21 02/19/21  Hoy Register, MD  insulin lispro (HUMALOG) 100 UNIT/ML injection Inject 0-9 units three times with meals daily into the skin.by sliding scale 02/05/20   Anders Simmonds, PA-C  insulin lispro (HUMALOG) 100 UNIT/ML injection Inject 0-0.09 mLs (0-9 Units total) into the skin 3 (three) times daily with meals. by sliding scale 02/05/20   Anders Simmonds, PA-C  Insulin Syringes, Disposable, U-100 0.5 ML MISC 1 each by Does not apply route 4 (four) times daily - after meals and at bedtime. 02/05/20   Anders Simmonds, PA-C  meloxicam (MOBIC) 7.5 MG tablet Take 1 tablet (7.5 mg total) by mouth daily. Patient not taking: Reported on 06/28/2020 04/06/20   Hoy Register, MD  metoCLOPramide (REGLAN) 5 MG tablet Take 1 tablet (5 mg total) by mouth 3 (three) times daily before meals. 07/07/20   Hoy Register, MD  metoCLOPramide (REGLAN) 5 MG tablet TAKE 1 TABLET (5 MG TOTAL) BY MOUTH 3 (THREE) TIMES DAILY BEFORE MEALS. 07/07/20 07/07/21  Hoy Register, MD  nicotine (NICODERM CQ - DOSED IN MG/24 HOURS) 14 mg/24hr patch  Place 1 patch (14 mg total) onto the skin daily. Patient not taking: Reported on 01/14/2020 11/29/19   Edsel Petrin, DO  ondansetron (ZOFRAN ODT) 4 MG disintegrating tablet Take 1 tablet (4 mg total) by mouth every 8 (eight) hours as needed for nausea or vomiting. 06/29/20   Milagros Loll, MD  ondansetron (ZOFRAN-ODT) 4 MG disintegrating tablet TAKE 1 TABLET (4 MG TOTAL) BY MOUTH EVERY 8 (EIGHT) HOURS AS NEEDED FOR NAUSEA OR VOMITING. 06/29/20 06/29/21  Milagros Loll, MD  OVER THE COUNTER MEDICATION Take 10 g by mouth daily. Kratom (Used for withdrawal/cease cravings)    [provider]  pantoprazole (PROTONIX) 40 MG tablet Take 1 tablet (40 mg total) by mouth 2 (two) times daily before a meal. Patient taking differently: Take 40 mg by mouth See admin instructions. Takes 40 mg 2 times a  week as needed for acid reflux 02/05/20   Anders Simmonds, PA-C  promethazine (PHENERGAN) 25 MG tablet TAKE 1 TABLET (25 MG TOTAL) BY MOUTH EVERY 6 (SIX) HOURS AS NEEDED FOR NAUSEA OR VOMITING. 02/05/20 02/04/21  Anders Simmonds, PA-C    Allergies    Patient has no allergy information on record.  Review of Systems   Review of Systems  Constitutional:  Positive for fatigue. Negative for chills and fever.  HENT:  Negative for ear pain and sore throat.   Eyes:  Negative for pain and visual disturbance.  Respiratory:  Negative for cough and shortness of breath.   Cardiovascular:  Positive for chest pain (heart burn). Negative for palpitations.  Gastrointestinal:  Positive for abdominal pain (epigastric), nausea and vomiting.  Endocrine: Positive for polydipsia.  Genitourinary:  Negative for dysuria and hematuria.  Musculoskeletal:  Negative for arthralgias and back pain.  Skin:  Negative for color change and rash.  Neurological:  Negative for seizures and syncope.  All other systems reviewed and are negative.  Physical Exam Updated Vital Signs BP (!) 144/107   Pulse (!) 106   Temp 97.7 F  (36.5 C) (Oral)   Resp (!) 26   LMP 03/16/2018 Comment: negative urine preg test 01/14/20  SpO2 100%   Physical Exam Vitals and nursing note reviewed.  Constitutional:      General: She is not in acute distress.    Appearance: She is well-developed. She is ill-appearing.  HENT:     Head: Normocephalic and atraumatic.     Mouth/Throat:     Mouth: Mucous membranes are dry.     Comments: Poor dentition Eyes:     Conjunctiva/sclera: Conjunctivae normal.  Cardiovascular:     Rate and Rhythm: Regular rhythm. Tachycardia present.     Heart sounds: No murmur heard. Pulmonary:     Effort: Pulmonary effort is normal. No respiratory distress.     Breath sounds: Normal breath sounds.     Comments: tachypnea Abdominal:     General: There is no distension.     Palpations: Abdomen is soft.     Tenderness: There is no abdominal tenderness. There is no guarding.  Musculoskeletal:     Cervical back: Neck supple.  Skin:    General: Skin is warm and dry.     Coloration: Skin is pale.  Neurological:     General: No focal deficit present.     Mental Status: She is alert and oriented to person, place, and time.    ED Results / Procedures / Treatments   Labs (all labs ordered are listed, but only abnormal results are displayed) Labs Reviewed  BASIC METABOLIC PANEL - Abnormal; Notable for the following components:      Result Value   Chloride 95 (*)    CO2 10 (*)    Glucose, Bld 416 (*)    BUN 29 (*)    Creatinine, Ser 1.75 (*)    GFR, Estimated 39 (*)    Anion gap 30 (*)    All other components within normal limits  CBC WITH DIFFERENTIAL/PLATELET - Abnormal; Notable for the following components:   WBC 14.5 (*)    HCT 48.5 (*)    Neutro Abs 11.8 (*)    Abs Immature Granulocytes 0.09 (*)    All other components within normal limits  BETA-HYDROXYBUTYRIC ACID - Abnormal; Notable for the following components:   Beta-Hydroxybutyric Acid >8.00 (*)    All other components within normal  limits  HEPATIC FUNCTION PANEL - Abnormal; Notable for the following components:   Total Protein 8.2 (*)    AST 13 (*)    Total Bilirubin 3.5 (*)    Indirect Bilirubin 3.3 (*)    All other components within normal limits  CBG MONITORING, ED - Abnormal; Notable for the following components:   Glucose-Capillary 350 (*)    All other components within normal limits  I-STAT VENOUS BLOOD GAS, ED - Abnormal; Notable for the following components:   pCO2, Ven 23.5 (*)    Bicarbonate 12.1 (*)    TCO2 13 (*)    Acid-base deficit 12.0 (*)    Sodium 133 (*)    Calcium, Ion 1.14 (*)    HCT 48.0 (*)    Hemoglobin 16.3 (*)    All other components within normal limits  I-STAT CHEM 8, ED - Abnormal; Notable for the following components:   Sodium 134 (*)    BUN 28 (*)    Glucose, Bld 421 (*)    TCO2 13 (*)    Hemoglobin 16.3 (*)    HCT 48.0 (*)    All other components within normal limits  CBG MONITORING, ED - Abnormal; Notable for the following components:   Glucose-Capillary 534 (*)    All other components within normal limits  CBG MONITORING, ED - Abnormal; Notable for the following components:   Glucose-Capillary 400 (*)    All other components within normal limits  CBG MONITORING, ED - Abnormal; Notable for the following components:   Glucose-Capillary 288 (*)    All other components within normal limits  RESP PANEL BY RT-PCR (FLU A&B, COVID) ARPGX2  URINALYSIS, ROUTINE W REFLEX MICROSCOPIC  BETA-HYDROXYBUTYRIC ACID  HIV ANTIBODY (ROUTINE TESTING W REFLEX)  HEMOGLOBIN A1C  BASIC METABOLIC PANEL  BASIC METABOLIC PANEL  BASIC METABOLIC PANEL  BASIC METABOLIC PANEL  I-STAT BETA HCG BLOOD, ED ([facility], [facility], [facility] ONLY)    EKG EKG Interpretation  Date/Time:  Saturday January 15 2021 10:23:21 EDT Ventricular Rate:  111 PR Interval:  96 QRS Duration: 76 QT Interval:  330 QTC Calculation: 448 R Axis:   83 Text Interpretation: Sinus tachycardia with short PR Right atrial enlargement  Nonspecific ST abnormality Abnormal ECG normal axis Confirmed by Pieter Partridge ([id number]) on 01/15/2021 1:54:20 PM  Radiology DG Chest Portable 1 View  Result Date: 01/15/2021 CLINICAL DATA:  Chest pain. Additional provided: Patient reports vomiting for 3 days, history of DKA. EXAM: PORTABLE CHEST 1 VIEW COMPARISON:  Prior chest radiographs 06/28/2020. FINDINGS: Heart size within normal limits. No appreciable airspace consolidation. No evidence of pleural effusion or pneumothorax. Incidentally noted azygos fissure. No acute bony abnormality identified. Upper thoracic levocurvature. IMPRESSION: No evidence of active cardiopulmonary disease. Electronically Signed   By: Jackey Loge DO   On: 01/15/2021 12:21    Procedures .Critical Care  Date/Time: 01/15/2021 2:48 PM Performed by: Koleen Distance, MD Authorized by: Koleen Distance, MD   Critical care provider statement:    Critical care time (minutes):  45   Critical care time was exclusive of:  Teaching time and separately billable procedures and treating other patients   Critical care was necessary to treat or prevent imminent or life-threatening deterioration of the following conditions:  Endocrine crisis   Critical care was time spent personally by me on the following activities:  Discussions with consultants, evaluation of patient's response to treatment, examination of patient, ordering and performing treatments and interventions, ordering and review of laboratory studies, ordering and  review of radiographic studies, pulse oximetry, re-evaluation of patient's condition, obtaining history from patient or surrogate, review of old charts and development of treatment plan with patient or surrogate   I assumed direction of critical care for this patient from another provider in my specialty: no     Care discussed with: admitting provider     Medications Ordered in ED Medications  lactated ringers bolus 20 mL/kg (has no administration in time range)   insulin regular, human (MYXREDLIN) 100 units/ 100 mL infusion (has no administration in time range)  lactated ringers infusion (has no administration in time range)  dextrose 5 % in lactated ringers infusion (has no administration in time range)  dextrose 50 % solution 0-50 mL (has no administration in time range)  potassium chloride 10 mEq in 100 mL IVPB (has no administration in time range)    ED Course  I have reviewed the triage vital signs and the nursing notes.  Pertinent labs & imaging results that were available during my care of the patient were reviewed by me and considered in my medical decision making (see chart for details).    MDM Rules/Calculators/A&P                          Madison Williams has a history of type 1 diabetes and has not had her medication for several days.  Subsequently, she developed severe nausea, vomiting, and has been unable to take anything by mouth.  She was concerned about DKA and presented to the ED.  She does have evidence of DKA.  She has an anion gap of 30.  Fortunately, her pH is relatively normal.  She was given IV fluids and started on an insulin drip.  She will be admitted for further evaluation. Final Clinical Impression(s) / ED Diagnoses Final diagnoses:  Diabetic ketoacidosis without coma associated with type 1 diabetes mellitus (HCC)    Rx / DC Orders ED Discharge Orders     None        Koleen Distance, MD 01/15/21 [phone]

## 2021-01-15 NOTE — ED Triage Notes (Signed)
Patient complains of vomiting x 3 days, out of long acting insulin x 5 days, only taking short acting, hx of DKA. Patient complains of backpain. Rapid respirations and pale

## 2021-01-15 NOTE — H&P (Signed)
Lorie Apley History and Physical    Nevea Spiewak ZOX:[id number] DOB: 1986-05-13 DOA: 01/15/2021  PCP: Hoy Register, MD (Confirm with patient/family/NH records and if not entered, this has to be entered at [facility] point of entry) Patient coming from: Home  I have personally briefly reviewed patient's old medical records in [facility]  Chief Complaint: Nausea, feeling weak  HPI: Madison Williams is a 35 y.o. female with medical history significant of IIDM, DM gastroparesis, HTN, HLD, presented with worsening of nausea, poor oral intake and feeling weak for 4 days.  Patient Duke Salvia her long-acting Lantus 4 days ago, since then she has been compensated by increasing her sliding scale short acting insulin.  Despite, her glucose reading has been in the range 400-500, and meantime she has been feeling very nauseous and unable to take any food or water for the last 3 days. No abd pain. ED Course: Blood glucose more than 400, positive beta hydroxy.  Bolus given and insulin drip started in ED.  Other labs, K4.4, bicarb 10, WBC 14.5.  Review of Systems: As per HPI otherwise 14 point review of systems negative.    Past Medical History:  Diagnosis Date   Asthma    Diabetes mellitus without complication (HCC)    Rheumatoid arthritis (HCC)     Past Surgical History:  Procedure Laterality Date   LAPAROSCOPIC OVARIAN     LEFT HEART CATH AND CORONARY ANGIOGRAPHY N/A 11/24/2019   Procedure: LEFT HEART CATH AND CORONARY ANGIOGRAPHY;  Surgeon: Kathleene Hazel, MD;  Location: [facility] INVAS[facility] CV [facility];  Service: Cardiovascular;  Laterality: N/A;     reports that she has quit smoking. She smoked an average of 1.00 packs per day. She has never used smokeless tobacco. She reports current alcohol use. She reports current drug use.  Not on File  No family history on file.   Prior to Admission medications   Medication Sig Start Date End Date Taking? Authorizing Provider  albuterol  (VENTOLIN HFA) 108 (90 Base) MCG/ACT inhaler Inhale 2 puffs into the lungs every 6 (six) hours as needed for wheezing or shortness of breath. 02/05/20   Anders Simmonds, PA-C  aspirin EC 81 MG EC tablet Take 1 tablet (81 mg total) by mouth daily. 11/29/19   Mikhail, Nita Sells, DO  atorvastatin (LIPITOR) 20 MG tablet Take 1 tablet (20 mg total) by mouth daily. 07/07/20   Hoy Register, MD  atorvastatin (LIPITOR) 20 MG tablet TAKE 1 TABLET (20 MG TOTAL) BY MOUTH DAILY. 07/07/20 07/07/21  Hoy Register, MD  bismuth subsalicylate (PEPTO BISMOL) 262 MG/15ML suspension Take 30 mLs by mouth every 6 (six) hours as needed for indigestion or diarrhea or loose stools.    [provider]  carvedilol (COREG) 3.125 MG tablet Take 1 tablet (3.125 mg total) by mouth 2 (two) times daily with a meal. 07/07/20   Newlin, Enobong, MD  carvedilol (COREG) 3.125 MG tablet TAKE 1 TABLET (3.125 MG TOTAL) BY MOUTH 2 (TWO) TIMES DAILY WITH A MEAL. 07/07/20 07/07/21  Hoy Register, MD  HUMALOG 100 UNIT/ML injection Inject 5-10 Units into the skin 3 (three) times daily with meals. Sliding scale. 06/08/20   [provider]  hydrOXYzine (ATARAX/VISTARIL) 25 MG tablet Take 1 tablet (25 mg total) by mouth 3 (three) times daily as needed. 07/07/20   Hoy Register, MD  hydrOXYzine (ATARAX/VISTARIL) 25 MG tablet TAKE 1 TABLET (25 MG TOTAL) BY MOUTH 3 (THREE) TIMES DAILY AS NEEDED. 07/07/20 07/07/21  Hoy Register, MD  ibuprofen (ADVIL) 200 MG tablet Take 200 mg by mouth every 6 (six) hours as needed for moderate pain.    [provider]  insulin aspart (NOVOLOG) 100 UNIT/ML injection Inject 0-9 Units into the skin 3 (three) times daily with meals. By sliding scale 02/05/20   Anders Simmonds, PA-C  insulin glargine (LANTUS) 100 UNIT/ML injection Inject 0.25 mLs (25 Units total) into the skin daily. 01/10/21 02/19/21  Hoy Register, MD  insulin lispro (HUMALOG) 100 UNIT/ML injection Inject 0-9 units three times with  meals daily into the skin.by sliding scale 02/05/20   Anders Simmonds, PA-C  insulin lispro (HUMALOG) 100 UNIT/ML injection Inject 0-0.09 mLs (0-9 Units total) into the skin 3 (three) times daily with meals. by sliding scale 02/05/20   Anders Simmonds, PA-C  Insulin Syringes, Disposable, U-100 0.5 ML MISC 1 each by Does not apply route 4 (four) times daily - after meals and at bedtime. 02/05/20   Anders Simmonds, PA-C  meloxicam (MOBIC) 7.5 MG tablet Take 1 tablet (7.5 mg total) by mouth daily. 04/06/20   Hoy Register, MD  metoCLOPramide (REGLAN) 5 MG tablet Take 1 tablet (5 mg total) by mouth 3 (three) times daily before meals. 07/07/20   Hoy Register, MD  metoCLOPramide (REGLAN) 5 MG tablet TAKE 1 TABLET (5 MG TOTAL) BY MOUTH 3 (THREE) TIMES DAILY BEFORE MEALS. 07/07/20 07/07/21  Hoy Register, MD  nicotine (NICODERM CQ - DOSED IN MG/24 HOURS) 14 mg/24hr patch Place 1 patch (14 mg total) onto the skin daily. 11/29/19   Mikhail, Nita Sells, DO  ondansetron (ZOFRAN ODT) 4 MG disintegrating tablet Take 1 tablet (4 mg total) by mouth every 8 (eight) hours as needed for nausea or vomiting. 06/29/20   Milagros Loll, MD  ondansetron (ZOFRAN-ODT) 4 MG disintegrating tablet TAKE 1 TABLET (4 MG TOTAL) BY MOUTH EVERY 8 (EIGHT) HOURS AS NEEDED FOR NAUSEA OR VOMITING. 06/29/20 06/29/21  Milagros Loll, MD  OVER THE COUNTER MEDICATION Take 10 g by mouth daily. Kratom (Used for withdrawal/cease cravings)    [provider]  pantoprazole (PROTONIX) 40 MG tablet Take 1 tablet (40 mg total) by mouth 2 (two) times daily before a meal. Patient taking differently: Take 40 mg by mouth See admin instructions. Takes 40 mg 2 times a week as needed for acid reflux 02/05/20   Anders Simmonds, PA-C  promethazine (PHENERGAN) 25 MG tablet TAKE 1 TABLET (25 MG TOTAL) BY MOUTH EVERY 6 (SIX) HOURS AS NEEDED FOR NAUSEA OR VOMITING. 02/05/20 02/04/21  Anders Simmonds, PA-C    Physical Exam: Vitals:   01/15/21 1315  01/15/21 1330 01/15/21 1345 01/15/21 1400  BP: (!) 140/95 (!) 143/97 (!) 146/98 (!) 144/96  Pulse: (!) 105 (!) 104 (!) 101 (!) 106  Resp: (!) 21 (!) 27 (!) 36 (!) 34  Temp:      TempSrc:      SpO2: 100% 100% 100% 99%  Weight:        Constitutional: NAD, calm, comfortable Vitals:   01/15/21 1315 01/15/21 1330 01/15/21 1345 01/15/21 1400  BP: (!) 140/95 (!) 143/97 (!) 146/98 (!) 144/96  Pulse: (!) 105 (!) 104 (!) 101 (!) 106  Resp: (!) 21 (!) 27 (!) 36 (!) 34  Temp:      TempSrc:      SpO2: 100% 100% 100% 99%  Weight:       Eyes: PERRL, lids and conjunctivae normal ENMT: Mucous membranes are dry. Posterior pharynx clear of any  exudate or lesions.Normal dentition.  Neck: normal, supple, no masses, no thyromegaly Respiratory: clear to auscultation bilaterally, no wheezing, no crackles. Normal respiratory effort. No accessory muscle use.  Cardiovascular: Regular rate and rhythm, no murmurs / rubs / gallops. No extremity edema. 2+ pedal pulses. No carotid bruits.  Abdomen: no tenderness, no masses palpated. No hepatosplenomegaly. Bowel sounds positive.  Musculoskeletal: no clubbing / cyanosis. No joint deformity upper and lower extremities. Good ROM, no contractures. Normal muscle tone.  Skin: no rashes, lesions, ulcers. No induration Neurologic: CN 2-12 grossly intact. Sensation intact, DTR normal. Strength 5/5 in all 4.  Psychiatric: Normal judgment and insight. Alert and oriented x 3. Normal mood.     Labs on Admission: I have personally reviewed following labs and imaging studies  CBC: Recent Labs  Lab 01/15/21 1007 01/15/21 1024  WBC 14.5*  --   NEUTROABS 11.8*  --   HGB 14.9 16.3*  16.3*  HCT 48.5* 48.0*  48.0*  MCV 98.4  --   PLT 317  --    Basic Metabolic Panel: Recent Labs  Lab 01/15/21 1007 01/15/21 1024  NA 135 133*  134*  K 4.3 4.3  4.3  CL 95* 102  CO2 10*  --   GLUCOSE 416* 421*  BUN 29* 28*  CREATININE 1.75* 0.90  CALCIUM 10.1  --     GFR: CrCl cannot be calculated (Unknown ideal weight.). Liver Function Tests: Recent Labs  Lab 01/15/21 1148  AST 13*  ALT 18  ALKPHOS 75  BILITOT 3.5*  PROT 8.2*  ALBUMIN 4.7   No results for input(s): LIPASE, AMYLASE in the last 168 hours. No results for input(s): AMMONIA in the last 168 hours. Coagulation Profile: No results for input(s): INR, PROTIME in the last 168 hours. Cardiac Enzymes: No results for input(s): CKTOTAL, CKMB, CKMBINDEX, TROPONINI in the last 168 hours. BNP (last 3 results) No results for input(s): PROBNP in the last 8760 hours. HbA1C: No results for input(s): HGBA1C in the last 72 hours. CBG: Recent Labs  Lab 01/15/21 1004 01/15/21 1144 01/15/21 1303 01/15/21 1408  GLUCAP 350* 534* 400* 288*   Lipid Profile: No results for input(s): CHOL, HDL, LDLCALC, TRIG, CHOLHDL, LDLDIRECT in the last 72 hours. Thyroid Function Tests: No results for input(s): TSH, T4TOTAL, FREET4, T3FREE, THYROIDAB in the last 72 hours. Anemia Panel: No results for input(s): VITAMINB12, FOLATE, FERRITIN, TIBC, IRON, RETICCTPCT in the last 72 hours. Urine analysis:    Component Value Date/Time   COLORURINE COLORLESS (A) 06/28/2020 1914   APPEARANCEUR CLEAR 06/28/2020 1914   LABSPEC 1.003 (L) 06/28/2020 1914   PHURINE 6.0 06/28/2020 1914   GLUCOSEU >=500 (A) 06/28/2020 1914   HGBUR NEGATIVE 06/28/2020 1914   BILIRUBINUR NEGATIVE 06/28/2020 1914   KETONESUR NEGATIVE 06/28/2020 1914   PROTEINUR NEGATIVE 06/28/2020 1914   NITRITE NEGATIVE 06/28/2020 1914   LEUKOCYTESUR NEGATIVE 06/28/2020 1914    Radiological Exams on Admission: DG Chest Portable 1 View  Result Date: 01/15/2021 CLINICAL DATA:  Chest pain. Additional provided: Patient reports vomiting for 3 days, history of DKA. EXAM: PORTABLE CHEST 1 VIEW COMPARISON:  Prior chest radiographs 06/28/2020. FINDINGS: Heart size within normal limits. No appreciable airspace consolidation. No evidence of pleural effusion  or pneumothorax. Incidentally noted azygos fissure. No acute bony abnormality identified. Upper thoracic levocurvature. IMPRESSION: No evidence of active cardiopulmonary disease. Electronically Signed   By: Jackey Loge DO   On: 01/15/2021 12:21    EKG: Independently reviewed.  Sinus tachycardia  Assessment/Plan Active  Problems:   DKA (diabetic ketoacidosis) (HCC)  (please populate well all problems here in Problem List. (For example, if patient is on BP meds at home and you resume or decide to hold them, it is a problem that needs to be her. Same for CAD, COPD, HLD and so on)   DKA -Probably combined with starvation acidosis as well -From noncoherent with insulin regimen -Endo tool started in ED, lab every 4 hours.  Admit to PCU.  HTN -BP elevated, resume Coreg  DM gastroparesis -Continue 3 times daily Reglan  HLD -Continue Statin   DVT prophylaxis: Lovenox Code Status: Full code Family Communication: None at bedside Disposition Plan: Expect 1 to 2 days hospital stay for insulin drip Consults called: None Admission status: PCU   Emeline General MD [facility] Pager [phone]  01/15/2021, 2:47 PM

## 2021-01-15 NOTE — ED Notes (Signed)
Attempted IV access, unsuccessful, patient states they normally have to use ultrasound when starting IV. IV team consult placed.

## 2021-01-15 NOTE — Progress Notes (Signed)
   01/15/21 1843  Assess: MEWS Score  Pulse Rate (!) 123  Assess: MEWS Score  MEWS Temp 0  MEWS Systolic 0  MEWS Pulse 2  MEWS RR 0  MEWS LOC 0  MEWS Score 2  MEWS Score Color Yellow  Assess: if the MEWS score is Yellow or Red  Were vital signs taken at a resting state? Yes  Focused Assessment No change from prior assessment  Early Detection of Sepsis Score *See Row Information* Low  MEWS guidelines implemented *See Row Information* Yes  Treat  MEWS Interventions Administered scheduled meds/treatments  Pain Scale 0-10  Pain Score Asleep  Take Vital Signs  Increase Vital Sign Frequency  Yellow: Q 2hr X 2 then Q 4hr X 2, if remains yellow, continue Q 4hrs  Escalate  MEWS: Escalate Yellow: discuss with charge nurse/RN and consider discussing with provider and RRT  Notify: Charge Nurse/RN  Name of Charge Nurse/RN Notified Judeth Cornfield RN  Date Charge Nurse/RN Notified 01/15/21  Time Charge Nurse/RN Notified 1845  Document  Patient Outcome Stabilized after interventions  Progress note created (see row info) Yes  Received pt from ED.  HR went up to 120-130s at rest.  Pt states she has been actively withdrawing from Kratom. Started on CIWA protocol. She vomited her pills earlier including carvedilol.  Administered carvedilol at 1900.

## 2021-01-15 NOTE — ED Provider Notes (Signed)
Emergency Medicine Provider Triage Evaluation Note  Madison Williams , a 35 y.o. female  was evaluated in triage.  PT is a type I diabetes.Pt complains of nausea, vomiting, diarrhea, generalized weakness for the past 4 days. Has been out of her long standing insulin and states her PCP wouldn't refill it. Also feeling SOB. Has been unable to keep anything down. Has been in DKA in the past and states this feels similar. .  Review of Systems  Positive: + abdominal pain, SOB, nausea, vomiting, diarrhea, weakness Negative: - fevers, chills  Physical Exam  BP (!) 141/106 (BP Location: Left Arm)   Pulse 99   Temp 97.7 F (36.5 C) (Oral)   Resp 19   LMP 03/16/2018 Comment: negative urine preg test 01/14/20  SpO2 100%  Gen:   Awake. Alert and oriented. Appears fatigued. Dry MM Resp:  Tachypneic MSK:   Moves extremities without difficulty  Other:    Medical Decision Making  Medically screening exam initiated at 10:18 AM.  Appropriate orders placed.  Madison Williams was informed that the remainder of the evaluation will be completed by another provider, this initial triage assessment does not replace that evaluation, and the importance of remaining in the ED until their evaluation is complete.  Concern for DKA.    Tanda Rockers, PA-C 01/15/21 1019    Long, Arlyss Repress, MD 01/17/21 [phone]

## 2021-01-15 NOTE — Progress Notes (Addendum)
Patient reported has been using Kratom (an opiod) and stopped using 2 days ago, now having withdrawal symtpoms.  Ordered clonidine patch, CIWA protocol, and phenergan.  D/W with on call pharmacist, who is going to help [place] to manage the Kratom withdrawal.

## 2021-01-16 LAB — BASIC METABOLIC PANEL
Anion gap: 10 (ref 5–15)
Anion gap: 12 (ref 5–15)
Anion gap: 15 (ref 5–15)
Anion gap: 7 (ref 5–15)
Anion gap: 9 (ref 5–15)
Anion gap: 9 (ref 5–15)
BUN: 16 mg/dL (ref 6–20)
BUN: 16 mg/dL (ref 6–20)
BUN: 17 mg/dL (ref 6–20)
BUN: 18 mg/dL (ref 6–20)
BUN: 18 mg/dL (ref 6–20)
BUN: 20 mg/dL (ref 6–20)
CO2: 21 mmol/L — ABNORMAL LOW (ref 22–32)
CO2: 23 mmol/L (ref 22–32)
CO2: 23 mmol/L (ref 22–32)
CO2: 24 mmol/L (ref 22–32)
CO2: 24 mmol/L (ref 22–32)
CO2: 25 mmol/L (ref 22–32)
Calcium: 9 mg/dL (ref 8.9–10.3)
Calcium: 9.1 mg/dL (ref 8.9–10.3)
Calcium: 9.1 mg/dL (ref 8.9–10.3)
Calcium: 9.4 mg/dL (ref 8.9–10.3)
Calcium: 9.6 mg/dL (ref 8.9–10.3)
Calcium: 9.7 mg/dL (ref 8.9–10.3)
Chloride: 101 mmol/L (ref 98–111)
Chloride: 103 mmol/L (ref 98–111)
Chloride: 104 mmol/L (ref 98–111)
Chloride: 105 mmol/L (ref 98–111)
Chloride: 105 mmol/L (ref 98–111)
Chloride: 107 mmol/L (ref 98–111)
Creatinine, Ser: 0.74 mg/dL (ref 0.44–1.00)
Creatinine, Ser: 0.8 mg/dL (ref 0.44–1.00)
Creatinine, Ser: 0.85 mg/dL (ref 0.44–1.00)
Creatinine, Ser: 0.94 mg/dL (ref 0.44–1.00)
Creatinine, Ser: 0.96 mg/dL (ref 0.44–1.00)
Creatinine, Ser: 1.15 mg/dL — ABNORMAL HIGH (ref 0.44–1.00)
GFR, Estimated: >60 mL/min (ref >60)
GFR, Estimated: >60 mL/min (ref >60)
GFR, Estimated: >60 mL/min (ref >60)
GFR, Estimated: >60 mL/min (ref >60)
GFR, Estimated: >60 mL/min (ref >60)
GFR, Estimated: >60 mL/min (ref >60)
Glucose, Bld: 187 mg/dL — ABNORMAL HIGH (ref 70–99)
Glucose, Bld: 194 mg/dL — ABNORMAL HIGH (ref 70–99)
Glucose, Bld: 216 mg/dL — ABNORMAL HIGH (ref 70–99)
Glucose, Bld: 229 mg/dL — ABNORMAL HIGH (ref 70–99)
Glucose, Bld: 254 mg/dL — ABNORMAL HIGH (ref 70–99)
Glucose, Bld: 350 mg/dL — ABNORMAL HIGH (ref 70–99)
Potassium: 3 mmol/L — ABNORMAL LOW (ref 3.5–5.1)
Potassium: 3.1 mmol/L — ABNORMAL LOW (ref 3.5–5.1)
Potassium: 3.2 mmol/L — ABNORMAL LOW (ref 3.5–5.1)
Potassium: 3.2 mmol/L — ABNORMAL LOW (ref 3.5–5.1)
Potassium: 3.3 mmol/L — ABNORMAL LOW (ref 3.5–5.1)
Potassium: 3.4 mmol/L — ABNORMAL LOW (ref 3.5–5.1)
Sodium: 136 mmol/L (ref 135–145)
Sodium: 137 mmol/L (ref 135–145)
Sodium: 137 mmol/L (ref 135–145)
Sodium: 138 mmol/L (ref 135–145)
Sodium: 139 mmol/L (ref 135–145)
Sodium: 140 mmol/L (ref 135–145)

## 2021-01-16 LAB — GLUCOSE, CAPILLARY
Glucose-Capillary: 138 mg/dL — ABNORMAL HIGH (ref 70–99)
Glucose-Capillary: 171 mg/dL — ABNORMAL HIGH (ref 70–99)
Glucose-Capillary: 172 mg/dL — ABNORMAL HIGH (ref 70–99)
Glucose-Capillary: 190 mg/dL — ABNORMAL HIGH (ref 70–99)
Glucose-Capillary: 195 mg/dL — ABNORMAL HIGH (ref 70–99)
Glucose-Capillary: 196 mg/dL — ABNORMAL HIGH (ref 70–99)
Glucose-Capillary: 198 mg/dL — ABNORMAL HIGH (ref 70–99)
Glucose-Capillary: 212 mg/dL — ABNORMAL HIGH (ref 70–99)
Glucose-Capillary: 215 mg/dL — ABNORMAL HIGH (ref 70–99)
Glucose-Capillary: 215 mg/dL — ABNORMAL HIGH (ref 70–99)
Glucose-Capillary: 218 mg/dL — ABNORMAL HIGH (ref 70–99)
Glucose-Capillary: 220 mg/dL — ABNORMAL HIGH (ref 70–99)
Glucose-Capillary: 226 mg/dL — ABNORMAL HIGH (ref 70–99)
Glucose-Capillary: 251 mg/dL — ABNORMAL HIGH (ref 70–99)
Glucose-Capillary: 251 mg/dL — ABNORMAL HIGH (ref 70–99)
Glucose-Capillary: 296 mg/dL — ABNORMAL HIGH (ref 70–99)

## 2021-01-16 LAB — PHOSPHORUS: Phosphorus: 2 mg/dL — ABNORMAL LOW (ref 2.5–4.6)

## 2021-01-16 LAB — MAGNESIUM: Magnesium: 2 mg/dL (ref 1.7–2.4)

## 2021-01-16 LAB — BETA-HYDROXYBUTYRIC ACID: Beta-Hydroxybutyric Acid: 1.38 mmol/L — ABNORMAL HIGH (ref 0.05–0.27)

## 2021-01-16 MED ORDER — LIDOCAINE 5 % EX PTCH
1.0000 | MEDICATED_PATCH | Freq: Every day | CUTANEOUS | Status: DC
  Administered 2021-01-16 – 2021-01-18 (×3): 1 via TRANSDERMAL
  Filled 2021-01-16 (×4): qty 1

## 2021-01-16 MED ORDER — POTASSIUM & SODIUM PHOSPHATES 280-160-250 MG PO PACK
1.0000 | PACK | Freq: Three times a day (TID) | ORAL | Status: DC
  Administered 2021-01-16 – 2021-01-18 (×7): 1 via ORAL
  Filled 2021-01-16 (×10): qty 1

## 2021-01-16 MED ORDER — INSULIN ASPART 100 UNIT/ML IJ SOLN
0.0000 [IU] | Freq: Three times a day (TID) | INTRAMUSCULAR | Status: DC
  Administered 2021-01-16: 3 [IU] via SUBCUTANEOUS
  Administered 2021-01-17: 5 [IU] via SUBCUTANEOUS
  Administered 2021-01-17: 9 [IU] via SUBCUTANEOUS

## 2021-01-16 MED ORDER — MORPHINE SULFATE (PF) 2 MG/ML IV SOLN
0.5000 mg | INTRAVENOUS | Status: DC | PRN
  Administered 2021-01-16 (×2): 0.5 mg via INTRAVENOUS
  Filled 2021-01-16 (×2): qty 1

## 2021-01-16 MED ORDER — ONDANSETRON HCL 4 MG/2ML IJ SOLN
4.0000 mg | Freq: Four times a day (QID) | INTRAMUSCULAR | Status: DC | PRN
  Administered 2021-01-16 – 2021-01-17 (×3): 4 mg via INTRAVENOUS
  Filled 2021-01-16 (×3): qty 2

## 2021-01-16 MED ORDER — KETOROLAC TROMETHAMINE 15 MG/ML IJ SOLN
15.0000 mg | Freq: Four times a day (QID) | INTRAMUSCULAR | Status: DC | PRN
  Administered 2021-01-16 (×2): 15 mg via INTRAVENOUS
  Filled 2021-01-16 (×2): qty 1

## 2021-01-16 MED ORDER — HYDROMORPHONE HCL 1 MG/ML IJ SOLN
0.5000 mg | Freq: Three times a day (TID) | INTRAMUSCULAR | Status: DC | PRN
Start: 2021-01-16 — End: 2021-01-18
  Administered 2021-01-16 – 2021-01-18 (×6): 0.5 mg via INTRAVENOUS
  Filled 2021-01-16 (×6): qty 1

## 2021-01-16 MED ORDER — GABAPENTIN 300 MG PO CAPS
300.0000 mg | ORAL_CAPSULE | Freq: Three times a day (TID) | ORAL | Status: DC
  Administered 2021-01-16 – 2021-01-18 (×6): 300 mg via ORAL
  Filled 2021-01-16 (×6): qty 1

## 2021-01-16 MED ORDER — MORPHINE SULFATE (PF) 2 MG/ML IV SOLN
1.0000 mg | INTRAVENOUS | Status: DC | PRN
  Administered 2021-01-16: 1 mg via INTRAVENOUS
  Filled 2021-01-16: qty 1

## 2021-01-16 MED ORDER — INSULIN GLARGINE 100 UNIT/ML ~~LOC~~ SOLN
20.0000 [IU] | Freq: Every day | SUBCUTANEOUS | Status: DC
  Administered 2021-01-16: 20 [IU] via SUBCUTANEOUS
  Filled 2021-01-16 (×2): qty 0.2

## 2021-01-16 MED ORDER — POTASSIUM CHLORIDE 10 MEQ/100ML IV SOLN
10.0000 meq | INTRAVENOUS | Status: AC
  Administered 2021-01-16 (×3): 10 meq via INTRAVENOUS
  Filled 2021-01-16 (×3): qty 100

## 2021-01-16 MED ORDER — FLUOXETINE HCL 20 MG PO CAPS
60.0000 mg | ORAL_CAPSULE | Freq: Every day | ORAL | Status: DC
  Administered 2021-01-16 – 2021-01-18 (×3): 60 mg via ORAL
  Filled 2021-01-16 (×3): qty 3

## 2021-01-16 MED ORDER — INSULIN ASPART 100 UNIT/ML IJ SOLN
0.0000 [IU] | Freq: Every day | INTRAMUSCULAR | Status: DC
Start: 2021-01-16 — End: 2021-01-17
  Administered 2021-01-16: 3 [IU] via SUBCUTANEOUS

## 2021-01-16 MED ORDER — INSULIN ASPART 100 UNIT/ML IJ SOLN
2.0000 [IU] | Freq: Three times a day (TID) | INTRAMUSCULAR | Status: DC
  Administered 2021-01-17 (×2): 2 [IU] via SUBCUTANEOUS

## 2021-01-16 MED ORDER — PANTOPRAZOLE SODIUM 40 MG IV SOLR
40.0000 mg | INTRAVENOUS | Status: DC
  Administered 2021-01-17 – 2021-01-18 (×2): 40 mg via INTRAVENOUS
  Filled 2021-01-16 (×2): qty 40

## 2021-01-16 NOTE — Progress Notes (Signed)
Inpatient Diabetes Program Recommendations  AACE/ADA: New Consensus Statement on Inpatient Glycemic Control (2015)  Target Ranges:  Prepandial:   less than 140 mg/dL      Peak postprandial:   less than 180 mg/dL (1-2 hours)      Critically ill patients:  140 - 180 mg/dL   Lab Results  Component Value Date   GLUCAP 138 (H) 01/16/2021   HGBA1C 9.8 (A) 07/07/2020    Review of Glycemic Control Results for Madison Williams, Madison Williams (MRN [id number]) as of 01/16/2021 14:01  Ref. Range 01/16/2021 10:22 01/16/2021 11:05 01/16/2021 13:12  Glucose-Capillary Latest Ref Range: 70 - 99 mg/dL 741 (H) 287 (H) 867 (H)   Diabetes history: DM Outpatient Diabetes medications:  Humalog 0-15 units tid with meals, Lantus 25 units daily (patient ran out) Current orders for Inpatient glycemic control:  IV insulin Inpatient Diabetes Program Recommendations:   For transition off insulin drip, consider Lantus 20 units (2 hours before) and Novolog sensitive tid with meals plus Novolog 3 units tid for meal coverage (hold if patient eats less than 50%).  Will have Diabetes coordinator f/u on 6/20.   Thanks  Beryl Meager, RN, BC-ADM Inpatient Diabetes Coordinator Pager [phone]  (8a-5p)

## 2021-01-16 NOTE — Progress Notes (Signed)
PROGRESS NOTE    Madison Williams  QMG:[id number] DOB: 1986-06-15 DOA: 01/15/2021 PCP: Hoy Register, MD  No chief complaint on file.   Brief Narrative: Madison Williams is a 35 y.o. female with medical history significant of IIDM, DM gastroparesis, HTN, HLD, presented with worsening of nausea, poor oral intake and feeling weak for 4 days. Patient reported has been using Kratom (an opiod) and stopped using 2 days ago, now having withdrawal symtpoms. She is on clonidine, CIWA protocol and phenergan.  Patient seen and examined at bedside   Assessment & Plan:   Active Problems:   DKA (diabetic ketoacidosis) (HCC)   DKA Improving, Anion gap is closed  Bicarb within normal limits Beta hydroxybutyric acid improving Transition to Lantus and NovoLog sliding scale insulin Clear liquid diet as tolerated. Hemoglobin A1c is pending   Gastroparesis Started the patient on IV Reglan, and Phenergan    Mild AKI Resolved   Leukocytosis Probably reactive, chest x-ray does not show any pneumonia, urine analysis is pending.  Hypokalemia replaced Check magnesium levels and repeat levels in the morning.   Opioid withdrawals Patient on clonidine, CIWA protocol and Phenergan continue to monitor     DVT prophylaxis: Lovenox Code Status: (Full code Family Communication: None at bedside Disposition:   Status is: Inpatient  Remains inpatient appropriate because:Ongoing diagnostic testing needed not appropriate for outpatient work up, Unsafe d/c plan, and IV treatments appropriate due to intensity of illness or inability to take PO  Dispo: The patient is from: Home              Anticipated d/c is to: Home              Patient currently is not medically stable to d/c.   Difficult to place patient No       Consultants:  None  Procedures: None Antimicrobials: None  Subjective: Nauseated and vomited earlier this morning Generalized body  aches  Objective: Vitals:   01/16/21 0531 01/16/21 0632 01/16/21 0749 01/16/21 0904  BP: 134/82 122/77 130/76 120/76  Pulse: 81 84 81 69  Resp: 18  17   Temp: 99.7 F (37.6 C)  99.9 F (37.7 C)   TempSrc: Oral  Oral   SpO2: 99%  100%   Weight:      Height:        Intake/Output Summary (Last 24 hours) at 01/16/2021 1007 Last data filed at 01/16/2021 0600 Gross per 24 hour  Intake 521.95 ml  Output --  Net 521.95 ml   Filed Weights   01/15/21 1100 01/15/21 1706  Weight: 55.5 kg 50.8 kg    Examination:  General exam: Appears calm and comfortable  Respiratory system: Clear to auscultation. Respiratory effort normal. Cardiovascular system: S1 & S2 heard, RRR. No JVD,  No pedal edema. Gastrointestinal system: Abdomen is nondistended, soft and nontender. Normal bowel sounds heard. Central nervous system: Alert and oriented. No focal neurological deficits. Extremities: Symmetric 5 x 5 power. Skin: No rashes, lesions or ulcers Psychiatry: Very anxious    Data Reviewed: I have personally reviewed following labs and imaging studies  CBC: Recent Labs  Lab 01/15/21 1007 01/15/21 1024 01/15/21 2001  WBC 14.5*  --  15.3*  NEUTROABS 11.8*  --   --   HGB 14.9 16.3*  16.3* 13.4  HCT 48.5* 48.0*  48.0* 39.3  MCV 98.4  --  89.1  PLT 317  --  290    Basic Metabolic Panel: Recent Labs  Lab 01/15/21 1701  01/15/21 2001 01/15/21 2348 01/16/21 0355 01/16/21 0802  NA 137 138 136 139 140  K 4.1 3.5 3.4* 3.2* 3.1*  CL 102 102 103 101 107  CO2 13* 20* 21* 23 24  GLUCOSE 209* 210* 254* 216* 187*  BUN 23* 22* 20 18 17   CREATININE 1.30* 1.15* 1.15* 0.94 0.85  CALCIUM 9.6 9.8 9.4 9.7 9.6  MG  --  2.1  --   --   --   PHOS  --  2.4*  --   --   --     GFR: Estimated Creatinine Clearance: 74.8 mL/min (by C-G formula based on SCr of 0.85 mg/dL).  Liver Function Tests: Recent Labs  Lab 01/15/21 1148 01/15/21 2001  AST 13* 12*  ALT 18 17  ALKPHOS 75 69  BILITOT 3.5*  2.3*  PROT 8.2* 7.2  ALBUMIN 4.7 4.2    CBG: Recent Labs  Lab 01/16/21 0450 01/16/21 0547 01/16/21 0648 01/16/21 0748 01/16/21 0900  GLUCAP 196* 195* 220* 172* 190*     Recent Results (from the past 240 hour(s))  Resp Panel by RT-PCR (Flu A&B, Covid) Nasopharyngeal Swab     Status: None   Collection Time: 01/15/21 11:19 AM   Specimen: Nasopharyngeal Swab; Nasopharyngeal(NP) swabs in vial transport medium  Result Value Ref Range Status   SARS Coronavirus 2 by RT PCR NEGATIVE NEGATIVE Final    Comment: (NOTE) SARS-CoV-2 target nucleic acids are NOT DETECTED.  The SARS-CoV-2 RNA is generally detectable in upper respiratory specimens during the acute phase of infection. The lowest concentration of SARS-CoV-2 viral copies this assay can detect is 138 copies/mL. A negative result does not preclude SARS-Cov-2 infection and should not be used as the sole basis for treatment or other patient management decisions. A negative result may occur with  improper specimen collection/handling, submission of specimen other than nasopharyngeal swab, presence of viral mutation(s) within the areas targeted by this assay, and inadequate number of viral copies(<138 copies/mL). A negative result must be combined with clinical observations, patient history, and epidemiological information. The expected result is Negative.  Fact Sheet for Patients:  01/17/21  Fact Sheet for Healthcare Providers:  BloggerCourse.com  This test is no t yet approved or cleared by the [employer].[place] FDA and  has been authorized for detection and/or diagnosis of SARS-CoV-2 by FDA under an Emergency Use Authorization (EUA). This EUA will remain  in effect (meaning this test can be used) for the duration of the COVID-19 declaration under Section 564(b)(1) of the Act, 21 U.S.C.section 360bbb-3(b)(1), unless the authorization is terminated  or revoked sooner.        Influenza A by PCR NEGATIVE NEGATIVE Final   Influenza B by PCR NEGATIVE NEGATIVE Final    Comment: (NOTE) The Xpert Xpress SARS-CoV-2/FLU/RSV plus assay is intended as an aid in the diagnosis of influenza from Nasopharyngeal swab specimens and should not be used as a sole basis for treatment. Nasal washings and aspirates are unacceptable for Xpert Xpress SARS-CoV-2/FLU/RSV testing.  Fact Sheet for Patients: [place]  Fact Sheet for Healthcare Providers: BloggerCourse.com  This test is not yet approved or cleared by the [employer]ious[employer]ker.it FDA and has been authorized for detection and/or diagnosis of SARS-CoV-2 by FDA under an Emergency Use Authorization (EUA). This EUA will remain in effect (meaning this test can be used) for the duration of the COVID-19 declaration under Section 564(b)(1) of the Act, 21 U.S.C. section 360bbb-3(b)(1), unless the authorization is terminated or revoked.  Performed at [facility], [address], [place], [place] [address]          Radiology Studies: DG Chest Portable 1 View  Result Date: 01/15/2021 CLINICAL DATA:  Chest pain. Additional provided: Patient reports vomiting for 3 days, history of DKA. EXAM: PORTABLE CHEST 1 VIEW COMPARISON:  Prior chest radiographs 06/28/2020. FINDINGS: Heart size within normal limits. No appreciable airspace consolidation. No evidence of pleural effusion or pneumothorax. Incidentally noted azygos fissure. No acute bony abnormality identified. Upper thoracic levocurvature. IMPRESSION: No evidence of active cardiopulmonary disease. Electronically Signed   By: Jackey Loge DO   On: 01/15/2021 12:21        Scheduled Meds:  carvedilol  3.125 mg Oral BID WC   enoxaparin (LOVENOX) injection  40 mg Subcutaneous Q24H   folic acid  1 mg Intravenous Daily   lidocaine  1 patch Transdermal Daily   metoCLOPramide (REGLAN) injection  10 mg Intravenous Q8H    multivitamin with minerals  1 tablet Oral Daily   nicotine  14 mg Transdermal Q24H   [START ON 01/17/2021] pantoprazole (PROTONIX) IV  40 mg Intravenous Q24H   thiamine  100 mg Oral Daily   Or   thiamine  100 mg Intravenous Daily   Continuous Infusions:  dextrose 5% lactated ringers 125 mL/hr at 01/16/21 0914   insulin 2.2 Units/hr (01/16/21 0902)   lactated ringers 125 mL/hr at 01/16/21 0034   promethazine (PHENERGAN) injection (IM or IVPB) Stopped (01/15/21 1604)     LOS: 1 day        Kathlen Mody, MD [facility]   To contact the attending provider between 7A-7P or the covering provider during after hours 7P-7A, please log into the web site [email or web address] and access using universal [place] password for that web site. If you do not have the password, please call the hospital operator.  01/16/2021, 10:07 AM

## 2021-01-17 ENCOUNTER — Encounter (HOSPITAL_COMMUNITY): Payer: Self-pay | Admitting: Internal Medicine

## 2021-01-17 ENCOUNTER — Other Ambulatory Visit: Payer: Self-pay

## 2021-01-17 DIAGNOSIS — E1010 Type 1 diabetes mellitus with ketoacidosis without coma: Principal | ICD-10-CM

## 2021-01-17 LAB — URINALYSIS, ROUTINE W REFLEX MICROSCOPIC
Bilirubin Urine: NEGATIVE
Glucose, UA: >=500 mg/dL — AB
Hgb urine dipstick: NEGATIVE
Ketones, ur: >80 mg/dL — AB
Leukocytes,Ua: NEGATIVE
Nitrite: NEGATIVE
Protein, ur: NEGATIVE mg/dL
Specific Gravity, Urine: 1.01 (ref 1.005–1.030)
pH: 6.5 (ref 5.0–8.0)

## 2021-01-17 LAB — BASIC METABOLIC PANEL
Anion gap: 14 (ref 5–15)
Anion gap: 7 (ref 5–15)
BUN: 12 mg/dL (ref 6–20)
BUN: 14 mg/dL (ref 6–20)
CO2: 23 mmol/L (ref 22–32)
CO2: 25 mmol/L (ref 22–32)
Calcium: 8.5 mg/dL — ABNORMAL LOW (ref 8.9–10.3)
Calcium: 8.9 mg/dL (ref 8.9–10.3)
Chloride: 100 mmol/L (ref 98–111)
Chloride: 106 mmol/L (ref 98–111)
Creatinine, Ser: 0.7 mg/dL (ref 0.44–1.00)
Creatinine, Ser: 0.89 mg/dL (ref 0.44–1.00)
GFR, Estimated: >60 mL/min (ref >60)
GFR, Estimated: >60 mL/min (ref >60)
Glucose, Bld: 249 mg/dL — ABNORMAL HIGH (ref 70–99)
Glucose, Bld: 284 mg/dL — ABNORMAL HIGH (ref 70–99)
Potassium: 3 mmol/L — ABNORMAL LOW (ref 3.5–5.1)
Potassium: 3.3 mmol/L — ABNORMAL LOW (ref 3.5–5.1)
Sodium: 137 mmol/L (ref 135–145)
Sodium: 138 mmol/L (ref 135–145)

## 2021-01-17 LAB — URINALYSIS, MICROSCOPIC (REFLEX)

## 2021-01-17 LAB — CBC WITH DIFFERENTIAL/PLATELET
Abs Immature Granulocytes: 0.02 10*3/uL (ref 0.00–0.07)
Basophils Absolute: 0 10*3/uL (ref 0.0–0.1)
Basophils Relative: 0 %
Eosinophils Absolute: 0 10*3/uL (ref 0.0–0.5)
Eosinophils Relative: 0 %
HCT: 34.1 % — ABNORMAL LOW (ref 36.0–46.0)
Hemoglobin: 11.5 g/dL — ABNORMAL LOW (ref 12.0–15.0)
Immature Granulocytes: 0 %
Lymphocytes Relative: 33 %
Lymphs Abs: 1.9 10*3/uL (ref 0.7–4.0)
MCH: 30.5 pg (ref 26.0–34.0)
MCHC: 33.7 g/dL (ref 30.0–36.0)
MCV: 90.5 fL (ref 80.0–100.0)
Monocytes Absolute: 0.5 10*3/uL (ref 0.1–1.0)
Monocytes Relative: 9 %
Neutro Abs: 3.4 10*3/uL (ref 1.7–7.7)
Neutrophils Relative %: 58 %
Platelets: 190 10*3/uL (ref 150–400)
RBC: 3.77 MIL/uL — ABNORMAL LOW (ref 3.87–5.11)
RDW: 14 % (ref 11.5–15.5)
WBC: 5.8 10*3/uL (ref 4.0–10.5)
nRBC: 0 % (ref 0.0–0.2)

## 2021-01-17 LAB — GLUCOSE, CAPILLARY
Glucose-Capillary: 374 mg/dL — ABNORMAL HIGH (ref 70–99)
Glucose-Capillary: 263 mg/dL — ABNORMAL HIGH (ref 70–99)
Glucose-Capillary: 253 mg/dL — ABNORMAL HIGH (ref 70–99)
Glucose-Capillary: 77 mg/dL (ref 70–99)

## 2021-01-17 LAB — HEMOGLOBIN A1C
Hgb A1c MFr Bld: 12 % — ABNORMAL HIGH (ref 4.8–5.6)
Mean Plasma Glucose: 298 mg/dL

## 2021-01-17 MED ORDER — INSULIN ASPART 100 UNIT/ML IJ SOLN
0.0000 [IU] | Freq: Three times a day (TID) | INTRAMUSCULAR | Status: DC
  Administered 2021-01-17: 8 [IU] via SUBCUTANEOUS

## 2021-01-17 MED ORDER — INSULIN ASPART 100 UNIT/ML IJ SOLN
0.0000 [IU] | Freq: Three times a day (TID) | INTRAMUSCULAR | Status: DC
Start: 2021-01-18 — End: 2021-01-17

## 2021-01-17 MED ORDER — INSULIN GLARGINE 100 UNIT/ML ~~LOC~~ SOLN
30.0000 [IU] | Freq: Every day | SUBCUTANEOUS | Status: DC
  Filled 2021-01-17 (×2): qty 0.3

## 2021-01-17 MED ORDER — FOLIC ACID 1 MG PO TABS
1.0000 mg | ORAL_TABLET | Freq: Every day | ORAL | Status: DC
  Administered 2021-01-17 – 2021-01-18 (×2): 1 mg via ORAL
  Filled 2021-01-17 (×2): qty 1

## 2021-01-17 MED ORDER — INSULIN GLARGINE 100 UNIT/ML ~~LOC~~ SOLN
15.0000 [IU] | Freq: Once | SUBCUTANEOUS | Status: AC
  Administered 2021-01-17: 15 [IU] via SUBCUTANEOUS
  Filled 2021-01-17: qty 0.15

## 2021-01-17 MED ORDER — INSULIN ASPART 100 UNIT/ML IJ SOLN
5.0000 [IU] | Freq: Three times a day (TID) | INTRAMUSCULAR | Status: DC
  Administered 2021-01-17 – 2021-01-18 (×3): 5 [IU] via SUBCUTANEOUS

## 2021-01-17 MED ORDER — SODIUM CHLORIDE 0.9 % IV SOLN
25.0000 mg | Freq: Four times a day (QID) | INTRAVENOUS | Status: DC | PRN
  Administered 2021-01-17 – 2021-01-18 (×3): 25 mg via INTRAVENOUS
  Filled 2021-01-17 (×3): qty 1

## 2021-01-17 MED ORDER — LIVING WELL WITH DIABETES BOOK
Freq: Once | Status: AC
  Filled 2021-01-17: qty 1

## 2021-01-17 MED ORDER — POTASSIUM CHLORIDE CRYS ER 20 MEQ PO TBCR
40.0000 meq | EXTENDED_RELEASE_TABLET | Freq: Once | ORAL | Status: AC
  Administered 2021-01-17: 40 meq via ORAL
  Filled 2021-01-17: qty 2

## 2021-01-17 MED ORDER — INSULIN ASPART 100 UNIT/ML IJ SOLN: 0.0000 [IU] | Freq: Every day | INTRAMUSCULAR | Status: DC

## 2021-01-17 MED ORDER — INSULIN ASPART 100 UNIT/ML IJ SOLN: 5.0000 [IU] | Freq: Three times a day (TID) | INTRAMUSCULAR | Status: DC

## 2021-01-17 MED ORDER — ENSURE ENLIVE PO LIQD
237.0000 mL | Freq: Two times a day (BID) | ORAL | Status: DC
  Administered 2021-01-17 – 2021-01-18 (×2): 237 mL via ORAL

## 2021-01-17 NOTE — Progress Notes (Signed)
Cross-coverage note:   Called regarding this type 1 diabetic admitted with DKA now on sq insulin.   Patient not eating, CBG 77, plan to give reduced dose of Lantus tonight.

## 2021-01-17 NOTE — Progress Notes (Addendum)
Inpatient Diabetes Program Recommendations  AACE/ADA: New Consensus Statement on Inpatient Glycemic Control (2015)  Target Ranges:  Prepandial:   less than 140 mg/dL      Peak postprandial:   less than 180 mg/dL (1-2 hours)      Critically ill patients:  140 - 180 mg/dL   Lab Results  Component Value Date   GLUCAP 374 (H) 01/17/2021   HGBA1C 12.0 (H) 01/15/2021    Review of Glycemic Control Results for Madison Williams, Madison Williams (MRN [id number]) as of 01/17/2021 11:27  Ref. Range 01/16/2021 14:07 01/16/2021 15:01 01/16/2021 16:26 01/16/2021 21:23 01/17/2021 08:08  Glucose-Capillary Latest Ref Range: 70 - 99 mg/dL 517 (H) 001 (H) 749 (H) 296 (H) 374 (H)   Diabetes history: DM Outpatient Diabetes medications: Lantus 25 units QHS, Humalog 0-15 units TID Current orders for Inpatient glycemic control: Lantus 20 units QHS, Novolog 0-9 units TID and 0-5 units QHS, Novolog 2 units TID  Inpatient Diabetes Program Recommendations:    Please consider:  Lantus 30 units QHS Novolog 5 units TID with meals (hold if eats less than 50%) Novolog 0-15 units TID  Addendum @ 1517: Spoke with patient at bedside.  She confirms she has T1DM and was diagnosed at age 35.  She states she ran out of insulin after trying to get refills from [facility].  She states she called 3 times and never received a call back and ended up in the ED in DKA.  Reviewed patient's current A1c of 12% (average blood sugar of 298 mg/dL). Explained what a A1c is and what it measures. Also reviewed goal A1c with patient, importance of good glucose control @ home, and blood sugar goals.  She lives with her grandmother and 67 year old daughter.  She is currently in between jobs and does not have insurance.  She checks her cbg's 5 x a day.  Encouraged her to see an endocrinologist when she has insurance.  She would love to be able to get an insulin pump and cgm in the future.  She does not drink any beverages with sugar.  She administers her insulin in her  abdomen.  Ordered LWWD booklet.  She states she will start trying to reach out to the pharmacy earlier next time as her insulin gets on the low side.     Will continue to follow while inpatient.  Thank you, Dulce Sellar, RN, BSN Diabetes Coordinator Inpatient Diabetes Program [phone] (team pager from 8a-5p)

## 2021-01-17 NOTE — Progress Notes (Signed)
PROGRESS NOTE  Madison Williams  ZOX:[id number] DOB: [birth year] DOA: 01/15/2021 PCP: Hoy Register, MD   Brief Narrative: Madison Williams is a 35 y.o. female with a history of T1DM, gastroparesis, HTN, HLD who presented to the ED, she reports, when she ran out of basal insulin and couldn't get a refill. She also reported feeling withdrawals from Kratom, stopped 2 days prior. She was noted to be in DKA which improved and ultimately transitioned to basal-bolus insulin. She continues to have nausea and vomiting.  Assessment & Plan: Active Problems:   DKA (diabetic ketoacidosis) (HCC)  DKA in T1DM: Ran out of basal insulin PTA.  - Continue lantus, increase to 30u qHS (25u home dose can be increased at DC, HbA1c 12%).  - Increase mealtime and continue SSI at moderate scale. Continue HS correction.   Nausea and vomiting: Still not tolerating adequate po intake for discharge.  - Continue antiemetics including reglan for gastroparesis.   Opioid withdrawal:  - Treat supportively.   AKI ruled out.  Hypokalemia:  - Supplement and monitor  HTN:  - Low dose coreg  HLD:  - Continue statin  DVT prophylaxis: Lovenox Code Status: Full Family Communication: None at bedside Disposition Plan:  Status is: Inpatient  Remains inpatient appropriate because:IV treatments appropriate due to intensity of illness or inability to take PO  Dispo: The patient is from: Home              Anticipated d/c is to: Home              Patient currently is not medically stable to d/c.   Difficult to place patient No  Consultants:  None  Procedures:  None  Antimicrobials: None   Subjective: Had emesis overnight, tolerated some clears this morning with nausea alone. Feels better than admission but reluctant to discharge today. No pain. Just got ativan for anxiety prior to my arrival.  Objective: Vitals:   01/17/21 0930 01/17/21 1357 01/17/21 1400 01/17/21 1500  BP:  133/80 133/80 116/73   Pulse: 71 81 85 69  Resp:  15    Temp:  98.9 F (37.2 C)    TempSrc:  Oral    SpO2:  99%    Weight:      Height:        Intake/Output Summary (Last 24 hours) at 01/17/2021 1647 Last data filed at 01/17/2021 1500 Gross per 24 hour  Intake 6365.31 ml  Output 1200 ml  Net 5165.31 ml   Filed Weights   01/15/21 1100 01/15/21 1706  Weight: 55.5 kg 50.8 kg    Gen: 35 y.o. female in no distress  Pulm: Non-labored breathing room air. Clear to auscultation bilaterally.  CV: Regular rate and rhythm. No murmur, rub, or gallop. No JVD, no pedal edema. GI: Abdomen soft, non-tender, non-distended, with normoactive bowel sounds. No organomegaly or masses felt. Ext: Warm, no deformities Skin: No rashes, lesions or ulcers on visualized skin. Neuro: Alert and oriented. No focal neurological deficits. Psych: Judgement and insight appear normal. Mood & affect appropriate.   Data Reviewed: I have personally reviewed following labs and imaging studies  CBC: Recent Labs  Lab 01/15/21 1007 01/15/21 1024 01/15/21 2001 01/17/21 1024  WBC 14.5*  --  15.3* 5.8  NEUTROABS 11.8*  --   --  3.4  HGB 14.9 16.3*  16.3* 13.4 11.5*  HCT 48.5* 48.0*  48.0* 39.3 34.1*  MCV 98.4  --  89.1 90.5  PLT 317  --  290 190  Basic Metabolic Panel: Recent Labs  Lab 01/15/21 2001 01/15/21 2348 01/16/21 1159 01/16/21 1620 01/16/21 2003 01/17/21 0022 01/17/21 1024  NA 138   < > 137 138 137 138 137  K 3.5   < > 3.0* 3.2* 3.3* 3.3* 3.0*  CL 102   < > 105 105 104 106 100  CO2 20*   < > 25 23 24 25 23   GLUCOSE 210*   < > 194* 229* 350* 249* 284*  BUN 22*   < > 16 18 16 14 12   CREATININE 1.15*   < > 0.80 0.96 0.74 0.70 0.89  CALCIUM 9.8   < > 9.1 9.1 9.0 8.9 8.5*  MG 2.1  --  2.0  --   --   --   --   PHOS 2.4*  --  2.0*  --   --   --   --    < > = values in this interval not displayed.   GFR: Estimated Creatinine Clearance: 71.4 mL/min (by C-G formula based on SCr of 0.89 mg/dL). Liver Function  Tests: Recent Labs  Lab 01/15/21 1148 01/15/21 2001  AST 13* 12*  ALT 18 17  ALKPHOS 75 69  BILITOT 3.5* 2.3*  PROT 8.2* 7.2  ALBUMIN 4.7 4.2   No results for input(s): LIPASE, AMYLASE in the last 168 hours. No results for input(s): AMMONIA in the last 168 hours. Coagulation Profile: No results for input(s): INR, PROTIME in the last 168 hours. Cardiac Enzymes: No results for input(s): CKTOTAL, CKMB, CKMBINDEX, TROPONINI in the last 168 hours. BNP (last 3 results) No results for input(s): PROBNP in the last 8760 hours. HbA1C: Recent Labs    01/15/21 1701  HGBA1C 12.0*   CBG: Recent Labs  Lab 01/16/21 1626 01/16/21 2123 01/17/21 0808 01/17/21 1151 01/17/21 1610  GLUCAP 215* 296* 374* 263* 253*   Lipid Profile: No results for input(s): CHOL, HDL, LDLCALC, TRIG, CHOLHDL, LDLDIRECT in the last 72 hours. Thyroid Function Tests: No results for input(s): TSH, T4TOTAL, FREET4, T3FREE, THYROIDAB in the last 72 hours. Anemia Panel: No results for input(s): VITAMINB12, FOLATE, FERRITIN, TIBC, IRON, RETICCTPCT in the last 72 hours. Urine analysis:    Component Value Date/Time   COLORURINE YELLOW 01/17/2021 0755   APPEARANCEUR CLEAR 01/17/2021 0755   LABSPEC 1.010 01/17/2021 0755   PHURINE 6.5 01/17/2021 0755   GLUCOSEU >=500 (A) 01/17/2021 0755   HGBUR NEGATIVE 01/17/2021 0755   BILIRUBINUR NEGATIVE 01/17/2021 0755   KETONESUR >80 (A) 01/17/2021 0755   PROTEINUR NEGATIVE 01/17/2021 0755   NITRITE NEGATIVE 01/17/2021 0755   LEUKOCYTESUR NEGATIVE 01/17/2021 0755   Recent Results (from the past 240 hour(s))  Resp Panel by RT-PCR (Flu A&B, Covid) Nasopharyngeal Swab     Status: None   Collection Time: 01/15/21 11:19 AM   Specimen: Nasopharyngeal Swab; Nasopharyngeal(NP) swabs in vial transport medium  Result Value Ref Range Status   SARS Coronavirus 2 by RT PCR NEGATIVE NEGATIVE Final    Comment: (NOTE) SARS-CoV-2 target nucleic acids are NOT DETECTED.  The SARS-CoV-2  RNA is generally detectable in upper respiratory specimens during the acute phase of infection. The lowest concentration of SARS-CoV-2 viral copies this assay can detect is 138 copies/mL. A negative result does not preclude SARS-Cov-2 infection and should not be used as the sole basis for treatment or other patient management decisions. A negative result may occur with  improper specimen collection/handling, submission of specimen other than nasopharyngeal swab, presence of viral mutation(s) within the areas  targeted by this assay, and inadequate number of viral copies(<138 copies/mL). A negative result must be combined with clinical observations, patient history, and epidemiological information. The expected result is Negative.  Fact Sheet for Patients:  BloggerCourse.com  Fact Sheet for Healthcare Providers:  SeriousBroker.it  This test is no t yet approved or cleared by the [place] FDA and  has been authorized for detection and/or diagnosis of SARS-CoV-2 by FDA under an Emergency Use Authorization (EUA). This EUA will remain  in effect (meaning this test can be used) for the duration of the COVID-19 declaration under Section 564(b)(1) of the Act, 21 U.S.C.section 360bbb-3(b)(1), unless the authorization is terminated  or revoked sooner.       Influenza A by PCR NEGATIVE NEGATIVE Final   Influenza B by PCR NEGATIVE NEGATIVE Final    Comment: (NOTE) The Xpert Xpress SARS-CoV-2/FLU/RSV plus assay is intended as an aid in the diagnosis of influenza from Nasopharyngeal swab specimens and should not be used as a sole basis for treatment. Nasal washings and aspirates are unacceptable for Xpert Xpress SARS-CoV-2/FLU/RSV testing.  Fact Sheet for Patients: BloggerCourse.com  Fact Sheet for Healthcare Providers: SeriousBroker.it  This test is not yet approved or cleared by the  [place] FDA and has been authorized for detection and/or diagnosis of SARS-CoV-2 by FDA under an Emergency Use Authorization (EUA). This EUA will remain in effect (meaning this test can be used) for the duration of the COVID-19 declaration under Section 564(b)(1) of the Act, 21 U.S.C. section 360bbb-3(b)(1), unless the authorization is terminated or revoked.  Performed at [facility], [address] [place], [place] [address]       Radiology Studies: No results found.  Scheduled Meds:  carvedilol  3.125 mg Oral BID WC   enoxaparin (LOVENOX) injection  40 mg Subcutaneous Q24H   feeding supplement  237 mL Oral BID BM   FLUoxetine  60 mg Oral Daily   folic acid  1 mg Oral Daily   gabapentin  300 mg Oral TID   insulin aspart  0-15 Units Subcutaneous TID WC   insulin aspart  0-5 Units Subcutaneous QHS   insulin aspart  5 Units Subcutaneous TID WC   insulin glargine  30 Units Subcutaneous QHS   lidocaine  1 patch Transdermal Daily   metoCLOPramide (REGLAN) injection  10 mg Intravenous Q8H   multivitamin with minerals  1 tablet Oral Daily   nicotine  14 mg Transdermal Q24H   pantoprazole (PROTONIX) IV  40 mg Intravenous Q24H   potassium & sodium phosphates  1 packet Oral TID WC & HS   thiamine  100 mg Oral Daily   Or   thiamine  100 mg Intravenous Daily   Continuous Infusions:  lactated ringers 50 mL/hr at 01/16/21 2014   promethazine (PHENERGAN) injection (IM or IVPB) 25 mg (01/17/21 0331)     LOS: 2 days   Time spent: 25 minutes.  Tyrone Nine, MD [facility] [email or web address] 01/17/2021, 4:47 PM

## 2021-01-18 ENCOUNTER — Other Ambulatory Visit: Payer: Self-pay

## 2021-01-18 LAB — HEMOGLOBIN A1C
Hgb A1c MFr Bld: 12.2 % — ABNORMAL HIGH (ref 4.8–5.6)
Mean Plasma Glucose: 303 mg/dL

## 2021-01-18 LAB — GLUCOSE, CAPILLARY
Glucose-Capillary: 208 mg/dL — ABNORMAL HIGH (ref 70–99)
Glucose-Capillary: 97 mg/dL (ref 70–99)

## 2021-01-18 MED ORDER — INSULIN ASPART 100 UNIT/ML IJ SOLN
0.0000 [IU] | Freq: Three times a day (TID) | INTRAMUSCULAR | Status: DC
  Administered 2021-01-18: 8 [IU] via SUBCUTANEOUS

## 2021-01-18 MED ORDER — PROMETHAZINE HCL 25 MG PO TABS
25.0000 mg | ORAL_TABLET | Freq: Four times a day (QID) | ORAL | 0 refills | Status: DC | PRN
  Filled 2021-01-18: qty 30, 8d supply, fill #0

## 2021-01-18 MED ORDER — INSULIN ASPART 100 UNIT/ML IJ SOLN
0.0000 [IU] | Freq: Every day | INTRAMUSCULAR | Status: DC
Start: 2021-01-18 — End: 2021-01-18

## 2021-01-18 MED ORDER — INSULIN GLARGINE 100 UNIT/ML ~~LOC~~ SOLN
25.0000 [IU] | Freq: Every day | SUBCUTANEOUS | 0 refills | Status: DC
  Filled 2021-01-18: qty 10, 28d supply, fill #0

## 2021-01-18 NOTE — Progress Notes (Signed)
CSW received consult for substance use resources for patient. CSW spoke with patient at bedside. CSW offered patient outpatient substance use treatment services. Patient accepted.

## 2021-01-18 NOTE — Progress Notes (Signed)
Inpatient Diabetes Program Recommendations  AACE/ADA: New Consensus Statement on Inpatient Glycemic Control (2015)  Target Ranges:  Prepandial:   less than 140 mg/dL      Peak postprandial:   less than 180 mg/dL (1-2 hours)      Critically ill patients:  140 - 180 mg/dL   Lab Results  Component Value Date   GLUCAP 77 01/17/2021   HGBA1C 12.2 (H) 01/16/2021    Review of Glycemic Control Results for Madison Williams, Madison Williams (MRN [id number]) as of 01/18/2021 07:55  Ref. Range 01/17/2021 16:10 01/17/2021 20:19  Glucose-Capillary Latest Ref Range: 70 - 99 mg/dL 650 (H) 77    Inpatient Diabetes Program Recommendations:     Novolog 0-9 units TID   Will continue to follow while inpatient.  Thank you, Dulce Sellar, RN, BSN Diabetes Coordinator Inpatient Diabetes Program [phone] (team pager from 8a-5p)

## 2021-01-18 NOTE — Discharge Summary (Signed)
Physician Discharge Summary  Madison Williams ZOX:[id number] DOB: Jul 04, 1986 DOA: 01/15/2021  PCP: Hoy Register, MD  Admit date: 01/15/2021 Discharge date: 01/18/2021  Admitted From: Home Disposition: Home   Recommendations for Outpatient Follow-up:  Follow up with PCP in 1-2 weeks Continue diabetes management. Refilled lantus prescription at discharge.   Home Health: None Equipment/Devices: None Discharge Condition: Stable CODE STATUS: Full Diet recommendation: Carb-modified  Brief/Interim Summary: Madison Williams is a 35 y.o. female with a history of T1DM, gastroparesis, HTN, HLD who presented to the ED, she reports, when she ran out of basal insulin and couldn't get a refill. She also reported feeling withdrawals from Kratom, stopped 2 days prior. She was noted to be in DKA which improved and ultimately transitioned to basal-bolus insulin. She continued to have nausea though vomiting resolved and symptoms controlled on antiemetic which is prescribed at discharge.   Discharge Diagnoses:  Active Problems:   DKA (diabetic ketoacidosis) (HCC)  DKA in T1DM: Ran out of basal insulin PTA. - Continue home medications, refilled lantus at discharge.    Nausea and vomiting: Improved, able to tolerate po. - Continue antiemetics including reglan for gastroparesis.   Opioid (Kratom) withdrawal: - Treat supportively.   AKI ruled out.   Hypokalemia: - Supplemented     HTN: - Low dose coreg   HLD: - Continue statin  Discharge Instructions Discharge Instructions     Discharge instructions   Complete by: As directed    You should continue taking insulin as you were previously. A new prescription for lantus insulin was sent to your pharmacy. Please follow [facility] for continued management of diabetes. You may take phenergan as needed for nausea/vomiting. Because Kratom is not a regulated product, it is recommended not to take this.    Your symptoms should slowly improve, though if you are unable to tolerate fluids or medications by mouth, seek medical attention right away.      Allergies as of 01/18/2021   No Known Allergies      Medication List     STOP taking these medications    aspirin 81 MG EC tablet   atorvastatin 20 MG tablet Commonly known as: LIPITOR   carvedilol 3.125 MG tablet Commonly known as: COREG   insulin aspart 100 UNIT/ML injection Commonly known as: novoLOG   OVER THE COUNTER MEDICATION       TAKE these medications    chlordiazePOXIDE 10 MG capsule Commonly known as: LIBRIUM Take 10 mg by mouth 2 (two) times daily as needed for anxiety.   FLUoxetine 20 MG capsule Commonly known as: PROZAC Take 60 mg by mouth daily.   gabapentin 300 MG capsule Commonly known as: NEURONTIN Take 300 mg by mouth 3 (three) times daily as needed. Neuropathy   HumaLOG 100 UNIT/ML injection Generic drug: insulin lispro Inject 0-15 Units into the skin 3 (three) times daily with meals. Sliding scale. What changed: Another medication with the same name was removed. Continue taking this medication, and follow the directions you see here.   ibuprofen 200 MG tablet Commonly known as: ADVIL Take 200 mg by mouth every 6 (six) hours as needed for moderate pain.   insulin glargine 100 UNIT/ML injection Commonly known as: LANTUS Inject 0.25 mLs (25 Units total) into the skin daily.   Insulin Syringes (Disposable) U-100 0.5 ML Misc 1 each by Does not apply route 4 (four) times daily - after meals and at bedtime.   metoCLOPramide 5 MG  tablet Commonly known as: REGLAN TAKE 1 TABLET (5 MG TOTAL) BY MOUTH 3 (THREE) TIMES DAILY BEFORE MEALS.   promethazine 25 MG tablet Commonly known as: PHENERGAN Take 1 tablet (25 mg total) by mouth every 6 (six) hours as needed for nausea or vomiting.        Follow-up Information     Hoy Register, MD. Schedule an appointment as soon as possible for a  visit.   Specialty: Family Medicine Contact information: [address] [place] [address] [phone]         Kathleene Hazel, MD .   Specialty: Cardiology Contact information: [address] [place] [address] [phone]                No Known Allergies  Consultations: None  Procedures/Studies: DG Chest Portable 1 View  Result Date: 01/15/2021 CLINICAL DATA:  Chest pain. Additional provided: Patient reports vomiting for 3 days, history of DKA. EXAM: PORTABLE CHEST 1 VIEW COMPARISON:  Prior chest radiographs 06/28/2020. FINDINGS: Heart size within normal limits. No appreciable airspace consolidation. No evidence of pleural effusion or pneumothorax. Incidentally noted azygos fissure. No acute bony abnormality identified. Upper thoracic levocurvature. IMPRESSION: No evidence of active cardiopulmonary disease. Electronically Signed   By: Jackey Loge DO   On: 01/15/2021 12:21      Subjective: Nausea without emesis this morning, glucose better controlled. No other complaints.   Discharge Exam: Vitals:   01/18/21 0813 01/18/21 1102  BP: (!) 146/100 (!) 140/93  Pulse: 67 95  Resp:  18  Temp:  98.3 F (36.8 C)  SpO2:  100%   General: Pt is alert, awake, not in acute distress Cardiovascular: RRR, S1/S2 +, no rubs, no gallops Respiratory: CTA bilaterally, no wheezing, no rhonchi Abdominal: Soft, NT, ND, bowel sounds + Extremities: No edema, no cyanosis  Labs: BNP (last 3 results) No results for input(s): BNP in the last 8760 hours. Basic Metabolic Panel: Recent Labs  Lab 01/15/21 2001 01/15/21 2348 01/16/21 1159 01/16/21 1620 01/16/21 2003 01/17/21 0022 01/17/21 1024  NA 138   < > 137 138 137 138 137  K 3.5   < > 3.0* 3.2* 3.3* 3.3* 3.0*  CL 102   < > 105 105 104 106 100  CO2 20*   < > 25 23 24 25 23   GLUCOSE 210*   < > 194* 229* 350* 249* 284*  BUN 22*   < > 16 18 16 14 12   CREATININE 1.15*   < > 0.80 0.96 0.74  0.70 0.89  CALCIUM 9.8   < > 9.1 9.1 9.0 8.9 8.5*  MG 2.1  --  2.0  --   --   --   --   PHOS 2.4*  --  2.0*  --   --   --   --    < > = values in this interval not displayed.   Liver Function Tests: Recent Labs  Lab 01/15/21 1148 01/15/21 2001  AST 13* 12*  ALT 18 17  ALKPHOS 75 69  BILITOT 3.5* 2.3*  PROT 8.2* 7.2  ALBUMIN 4.7 4.2   No results for input(s): LIPASE, AMYLASE in the last 168 hours. No results for input(s): AMMONIA in the last 168 hours. CBC: Recent Labs  Lab 01/15/21 1007 01/15/21 1024 01/15/21 2001 01/17/21 1024  WBC 14.5*  --  15.3* 5.8  NEUTROABS 11.8*  --   --  3.4  HGB 14.9 16.3*  16.3* 13.4 11.5*  HCT 48.5* 48.0*  48.0* 39.3 34.1*  MCV 98.4  --  89.1 90.5  PLT 317  --  290 190   Cardiac Enzymes: No results for input(s): CKTOTAL, CKMB, CKMBINDEX, TROPONINI in the last 168 hours. BNP: Invalid input(s): POCBNP CBG: Recent Labs  Lab 01/17/21 1151 01/17/21 1610 01/17/21 2019 01/18/21 0802 01/18/21 1103  GLUCAP 263* 253* 77 208* 97   D-Dimer No results for input(s): DDIMER in the last 72 hours. Hgb A1c Recent Labs    01/15/21 1701 01/16/21 0802  HGBA1C 12.0* 12.2*   Lipid Profile No results for input(s): CHOL, HDL, LDLCALC, TRIG, CHOLHDL, LDLDIRECT in the last 72 hours. Thyroid function studies No results for input(s): TSH, T4TOTAL, T3FREE, THYROIDAB in the last 72 hours.  Invalid input(s): FREET3 Anemia work up No results for input(s): VITAMINB12, FOLATE, FERRITIN, TIBC, IRON, RETICCTPCT in the last 72 hours. Urinalysis    Component Value Date/Time   COLORURINE YELLOW 01/17/2021 0755   APPEARANCEUR CLEAR 01/17/2021 0755   LABSPEC 1.010 01/17/2021 0755   PHURINE 6.5 01/17/2021 0755   GLUCOSEU >=500 (A) 01/17/2021 0755   HGBUR NEGATIVE 01/17/2021 0755   BILIRUBINUR NEGATIVE 01/17/2021 0755   KETONESUR >80 (A) 01/17/2021 0755   PROTEINUR NEGATIVE 01/17/2021 0755   NITRITE NEGATIVE 01/17/2021 0755   LEUKOCYTESUR NEGATIVE  01/17/2021 0755    Microbiology Recent Results (from the past 240 hour(s))  Resp Panel by RT-PCR (Flu A&B, Covid) Nasopharyngeal Swab     Status: None   Collection Time: 01/15/21 11:19 AM   Specimen: Nasopharyngeal Swab; Nasopharyngeal(NP) swabs in vial transport medium  Result Value Ref Range Status   SARS Coronavirus 2 by RT PCR NEGATIVE NEGATIVE Final    Comment: (NOTE) SARS-CoV-2 target nucleic acids are NOT DETECTED.  The SARS-CoV-2 RNA is generally detectable in upper respiratory specimens during the acute phase of infection. The lowest concentration of SARS-CoV-2 viral copies this assay can detect is 138 copies/mL. A negative result does not preclude SARS-Cov-2 infection and should not be used as the sole basis for treatment or other patient management decisions. A negative result may occur with  improper specimen collection/handling, submission of specimen other than nasopharyngeal swab, presence of viral mutation(s) within the areas targeted by this assay, and inadequate number of viral copies(<138 copies/mL). A negative result must be combined with clinical observations, patient history, and epidemiological information. The expected result is Negative.  Fact Sheet for Patients:  BloggerCourse.com  Fact Sheet for Healthcare Providers:  SeriousBroker.it  This test is no t yet approved or cleared by the [place] FDA and  has been authorized for detection and/or diagnosis of SARS-CoV-2 by FDA under an Emergency Use Authorization (EUA). This EUA will remain  in effect (meaning this test can be used) for the duration of the COVID-19 declaration under Section 564(b)(1) of the Act, 21 U.S.C.section 360bbb-3(b)(1), unless the authorization is terminated  or revoked sooner.       Influenza A by PCR NEGATIVE NEGATIVE Final   Influenza B by PCR NEGATIVE NEGATIVE Final    Comment: (NOTE) The Xpert Xpress  SARS-CoV-2/FLU/RSV plus assay is intended as an aid in the diagnosis of influenza from Nasopharyngeal swab specimens and should not be used as a sole basis for treatment. Nasal washings and aspirates are unacceptable for Xpert Xpress SARS-CoV-2/FLU/RSV testing.  Fact Sheet for Patients: BloggerCourse.com  Fact Sheet for Healthcare Providers: SeriousBroker.it  This test is not yet approved or cleared by the [place] FDA and has been authorized for detection  and/or diagnosis of SARS-CoV-2 by FDA under an Emergency Use Authorization (EUA). This EUA will remain in effect (meaning this test can be used) for the duration of the COVID-19 declaration under Section 564(b)(1) of the Act, 21 U.S.C. section 360bbb-3(b)(1), unless the authorization is terminated or revoked.  Performed at [facility], [address], [place], [place] [address]     Time coordinating discharge: Approximately 40 minutes  Tyrone Nine, MD  [facility] 01/18/2021, 11:09 AM

## 2021-01-19 ENCOUNTER — Telehealth: Payer: Self-pay

## 2021-01-19 NOTE — Telephone Encounter (Signed)
Transition Care Management Unsuccessful Follow-up Telephone Call  Date of discharge and from where:  01/18/2021, [facility]  Attempts:  1st Attempt  Reason for unsuccessful TCM follow-up call:  Unable to leave message - call placed to # [phone], voicemail full. Called # [phone], voicemail not set up.

## 2021-01-20 ENCOUNTER — Telehealth: Payer: Self-pay

## 2021-01-20 ENCOUNTER — Other Ambulatory Visit: Payer: Self-pay

## 2021-01-20 MED ORDER — INSULIN LISPRO 100 UNIT/ML IJ SOLN
INTRAMUSCULAR | 7 refills | Status: DC
  Filled 2021-01-20: qty 10, 28d supply, fill #0

## 2021-01-20 MED FILL — Metoclopramide HCl Tab 5 MG (Base Equivalent): ORAL | 30 days supply | Qty: 90 | Fill #0 | Status: AC

## 2021-01-20 NOTE — Telephone Encounter (Signed)
Transition Care Management Follow-up Telephone Call Date of discharge and from where: 01/18/2021, [facility]  How have you been since you were released from the hospital? She said she is feeling a little better but still really weak and having a hard time eating. She does not have all of her medications yet  Any questions or concerns? Yes - noted above   Items Reviewed: Did the pt receive and understand the discharge instructions provided? Yes  Medications obtained and verified?  She has  not picked up the lantus, reglan, phenergan or humalog. Informed her that they will be ready for pick up this afternoon at [facility] and she said that her mother will get them for her.  She has the other medications and only has a couple of does of humalog left.  She didn't have any questions about the med regime.  Other? No  Any new allergies since your discharge? No  Do you have support at home? Yes   Home Care and Equipment/Supplies: Were home health services ordered? no If so, what is the name of the agency? N/a  Has the agency set up a time to come to the patient's home? not applicable Were any new equipment or medical supplies ordered?  No What is the name of the medical supply agency? N/a Were you able to get the supplies/equipment? not applicable Do you have any questions related to the use of the equipment or supplies? No  She has a working glucometer.  Blood sugar this morning  222   Functional Questionnaire: (I = Independent and D = Dependent) ADLs: independent    Follow up appointments reviewed:  PCP Hospital f/u appt confirmed? Yes  Scheduled to see Angus Seller, NP @ PCC/TOC clinic 01/27/2021.   Dr Alvis Lemmings 03/14/2021 - this appointment can be rescheduled if needed.   Specialist Hospital f/u appt confirmed? No   Are transportation arrangements needed? No  If their condition worsens, is the pt aware to call PCP or go to the Emergency Dept.? Yes Was the patient provided with  contact information for the PCP's office or ED? Yes - also provided her with the phone number for [facility] Was to pt encouraged to call back with questions or concerns? Yes

## 2021-01-27 ENCOUNTER — Ambulatory Visit: Payer: Medicaid Other

## 2021-03-04 ENCOUNTER — Other Ambulatory Visit: Payer: Self-pay | Admitting: Family Medicine

## 2021-03-04 ENCOUNTER — Other Ambulatory Visit: Payer: Self-pay

## 2021-03-04 MED ORDER — INSULIN GLARGINE 100 UNIT/ML ~~LOC~~ SOLN
25.0000 [IU] | Freq: Every day | SUBCUTANEOUS | 0 refills | Status: DC
  Filled 2021-03-04: qty 10, 28d supply, fill #0

## 2021-03-07 ENCOUNTER — Other Ambulatory Visit: Payer: Self-pay | Admitting: Physician Assistant

## 2021-03-07 ENCOUNTER — Other Ambulatory Visit: Payer: Self-pay

## 2021-03-07 MED ORDER — INSULIN LISPRO 100 UNIT/ML IJ SOLN
INTRAMUSCULAR | 1 refills | Status: DC
  Filled 2021-03-07: qty 10, 37d supply, fill #0
  Filled 2021-04-11 – 2021-04-19 (×2): qty 10, 37d supply, fill #1

## 2021-03-07 NOTE — Telephone Encounter (Signed)
[facility] called and spoke to [place], Pharmacologist about the refill(s) Insulin requested. Advised it was sent on 01/20/21 #10 ml/7 refill(s). She says that Rx was written on 02/05/20 and it shows it was last dispensed on 01/20/21, so they don't have any more refills remaining.

## 2021-03-08 ENCOUNTER — Other Ambulatory Visit: Payer: Self-pay

## 2021-03-14 ENCOUNTER — Encounter: Payer: Self-pay | Admitting: Family Medicine

## 2021-03-14 ENCOUNTER — Ambulatory Visit: Payer: Medicaid Other | Attending: Family Medicine | Admitting: Family Medicine

## 2021-03-14 ENCOUNTER — Other Ambulatory Visit: Payer: Self-pay

## 2021-03-14 VITALS — BP 120/67 | HR 82 | Ht 67.0 in | Wt 125.6 lb

## 2021-03-14 DIAGNOSIS — I10 Essential (primary) hypertension: Secondary | ICD-10-CM | POA: Diagnosis not present

## 2021-03-14 DIAGNOSIS — E1065 Type 1 diabetes mellitus with hyperglycemia: Secondary | ICD-10-CM

## 2021-03-14 DIAGNOSIS — M25472 Effusion, left ankle: Secondary | ICD-10-CM | POA: Insufficient documentation

## 2021-03-14 DIAGNOSIS — Z794 Long term (current) use of insulin: Secondary | ICD-10-CM | POA: Insufficient documentation

## 2021-03-14 DIAGNOSIS — M25471 Effusion, right ankle: Secondary | ICD-10-CM | POA: Diagnosis not present

## 2021-03-14 DIAGNOSIS — Z79899 Other long term (current) drug therapy: Secondary | ICD-10-CM | POA: Insufficient documentation

## 2021-03-14 DIAGNOSIS — R600 Localized edema: Secondary | ICD-10-CM

## 2021-03-14 LAB — GLUCOSE, POCT (MANUAL RESULT ENTRY): POC Glucose: 200 mg/dl — AB (ref 70–99)

## 2021-03-14 MED ORDER — INSULIN GLARGINE 100 UNIT/ML ~~LOC~~ SOLN
25.0000 [IU] | Freq: Every day | SUBCUTANEOUS | 3 refills | Status: DC
Start: 2021-03-14 — End: 2021-12-30
  Filled 2021-03-14: qty 30, 120d supply, fill #0
  Filled 2021-04-11 – 2021-04-19 (×2): qty 10, 28d supply, fill #0
  Filled 2021-05-26: qty 10, 28d supply, fill #1
  Filled 2021-07-14: qty 10, 28d supply, fill #2
  Filled 2021-08-22: qty 10, 28d supply, fill #0
  Filled 2021-08-22: qty 10, 28d supply, fill #3
  Filled 2021-08-22: qty 10, 28d supply, fill #0
  Filled 2021-09-28: qty 10, 28d supply, fill #1
  Filled 2021-11-03: qty 10, 28d supply, fill #2
  Filled 2021-12-09: qty 10, 28d supply, fill #3

## 2021-03-14 NOTE — Patient Instructions (Signed)
La diabetes mellitus y las normas bsicas de atencin mdica Diabetes Mellitus and Standards of Medical Care Vivir con diabetes (diabetes mellitus) y controlarla puede ser complicado. Su tratamiento de la diabetes puede ser administrado por un equipo de profesionales de la salud, que incluye: Un mdico especializado en diabetes (endocrinlogo). Tambin podra tener visitas con un enfermero especializado o auxiliar mdico. Enfermeras. Un nutricionista certificado. Un especialista en atencin y educacin sobre la diabetes certificado. Un especialista en actividad fsica. Un farmacutico. Un oculista. Un especialista en pies (podlogo). Un proveedor de atencin dental. Un mdico de cabecera. Un profesional de salud mental. Cmo controlar la diabetes Puede hacer muchas cosas para controlar con xito la diabetes. Sus mdicos seguirn pautas para ayudarlo a obtener la mejor calidad de atencin. Estas son las pautas generales para su plan de control de la diabetes. Los mdicos tambin podrn darle instrucciones ms especficas. Exmenes fsicos Tras ser diagnosticado con diabetes, y cada ao luego de esto, su mdico le preguntar acerca de sus antecedentes mdicos y familiares. Le harn un examen fsico, que puede incluir: Medicin de la estatura, peso e ndice de masa corporal (IMC). Control de la presin arterial. Esto se realiza en cada visita mdica de rutina. La presin arterial deseada puede variar en funcin de las enfermedades, la edad y otros factores personales. Un examen de la tiroides. Un examen de la piel. Un examen para deteccin de dao nervioso (neuropata perifrica). Esto puede incluir controlar el pulso de las piernas y los pies, y el nivel de sensibilidad en las manos y pies. Un examen de pies para inspeccionar la estructura y la piel de los pies, lo que incluye controlar si hay cortes, moretones, enrojecimiento, ampollas, llagas u otros problemas. Exmenes de deteccin de  problemas en los vasos sanguneos (vasculares). Esto puede incluir controlar el pulso en las piernas y los pies, y controlar la temperatura. Anlisis de sangre Segn el plan de tratamiento y de las necesidades personales, es posible que se le realicen las siguientes pruebas: Hemoglobina A1C (HbA1C). Este anlisis proporciona informacin sobre el control de la glucemia (glucosa en la sangre) durante los ltimos 2 o 3 meses. Se usa para ajustar el plan de tratamiento, de ser necesario. Este anlisis se har: Al menos 2 veces al ao, si cumple los objetivos del tratamiento. Cuatro veces al ao, si no cumple los objetivos del tratamiento o si sus objetivos han cambiado. Anlisis de lpidos, lo que incluye colesterol total, colesterol LDL y HDL, y niveles de triglicridos. En relacin al LDL, el objetivo es tener menos de 100 mg/dl (5,5 mmol/l). Si tiene alto riesgo de complicaciones, el objetivo es tener menos de 70 mg/dl (3.9 mmol/l). En relacin al HDL, el objetivo es tener 40 mg/dl (2.2 mmol/l) o ms para los hombres y 50 mg/dl (2.8 mmol/l) o ms para las mujeres. Un nivel de colesterol HDL de 60 mg/dl (3.3 mmol/l) o superior da una cierta proteccin contra la enfermedad cardaca. En relacin a los triglicridos, el objetivo es tener menos de 150 mg/dl (8,3 mmol/l). Pruebas funcionales hepticas. Pruebas de la funcin renal. Pruebas de la funcin tiroidea.  Exmenes dentales y oculares  Visite al dentista dos veces por ao. Si tiene diabetes tipo 1, el mdico puede recomendarle que se haga un examen ocular en un plazo de 5 aos despus del diagnstico y, luego, una vez al ao despus del primer examen. Para los nios que tienen diabetes tipo 1, el pediatra puede recomendar un examen ocular cuando el nio tiene 11   aos o ms y ha tenido diabetes durante 3 a 5 aos. Despus del primer examen, el nio debe hacerse un examen ocular una vez al ao. Si tiene diabetes tipo 2, el mdico puede recomendarle  que se haga un examen ocular ni bien haya sido diagnosticado y, luego, cada 1 o 2 aos despus del primer examen. Vacunas Se recomienda aplicar de forma anual la vacuna contra la gripe (influenza) a todas las personas de 6 meses en adelante. Esto es muy importante si tiene diabetes. La vacuna contra la neumona (antineumoccica) est recomendada para todas las personas de 2 aos en adelante que tengan diabetes. Si es mayor de 65 aos, puede recibir la vacuna antineumoccica como una serie de dos inyecciones diferentes. Se recomienda administrar la vacuna contra la hepatitis B en adultos poco despus de que hayan recibido el diagnstico de diabetes. Los adultos y los nios que tienen diabetes deben recibir todas las vacunas de acuerdo con las recomendaciones especficas segn la edad de los Centros para el Control y la Prevencin de Enfermedades (Centers for Disease Control and Prevention, CDC). Salud mental y emocional Se recomienda realizar controles para detectar sntomas de trastornos de la alimentacin, ansiedad y depresin en el momento del diagnstico, y posteriormente segn sea necesario. Si los controles revelan la presencia de sntomas, es posible que deba someterse a ms evaluaciones. Es posible que trabaje con un profesional de la salud mental. Siga estas instrucciones en su casa: Plan de tratamiento Usted medir sus niveles de glucemia y quizs pueda administrarse insulina. Su plan de tratamiento ser revisado en cada visita mdica. Usted y el mdico analizarn lo siguiente: Cmo est recibiendo los medicamentos, incluso la insulina. Cualquier efecto secundario que tenga. Sus objetivos deseados con relacin al nivel de glucemia. Con qu frecuencia se mide el nivel de glucemia. Hbitos del estilo de vida, como nivel de actividad y el consumo de tabaco, alcohol y drogas. Educacin El mdico evaluar qu tan bien se est midiendo los niveles de glucemia y si est recibiendo la insulina y  los medicamentos de manera correcta. El mdico puede derivarlo a: Un especialista en atencin y educacin sobre la diabetes certificado, para manejar la diabetes durante toda la vida, comenzando desde el diagnstico. Un nutricionista matriculado que puede crear y revisar un plan de nutricin personal. Un especialista en ejercicios que puede analizar el nivel de actividad y un plan de ejercicios. Instrucciones generales Use los medicamentos de venta libre y los recetados solamente como se lo haya indicado el mdico. Cumpla con todas las visitas de seguimiento. Esto es importante. Dnde buscar apoyo Hay muchas redes de apoyo para la diabetes, entre ellas: American Diabetes Association (ADA) (Asociacin Estadounidense de la Diabetes): diabetes.org Defeat Diabetes Foundation (Fundacin Defeat Diabetes): defeatdiabetes.org Dnde buscar ms informacin American Diabetes Association (ADA) (Asociacin Estadounidense de la Diabetes): www.diabetes.org Association of Diabetes Care & Education Specialists (ADCES) (Asociacin de Especialistas en Atencin y Educacin sobre la Diabetes): diabeteseducator.org International Diabetes Federation (IDF) (Federacin Internacional de Diabetes): idf.org Resumen Tratar la diabetes (diabetes mellitus) puede ser complicado. Su tratamiento de la diabetes puede ser administrado por un equipo de profesionales de la salud. Los mdicos siguen pautas para ayudarlo a obtener la mejor calidad de atencin. Usted debe realizarse exmenes fsicos, anlisis de sangre y controles de la presin arterial, aplicarse vacunas y someterse a estudios de control con regularidad. Mantngase al da sobre cmo controlar la diabetes. Sus mdicos tambin podrn darle instrucciones ms especficas basndose en su salud. Esta informacin no tiene como fin   reemplazar el consejo del mdico. Asegrese de hacerle al mdico cualquier pregunta que tenga. Document Revised: 02/26/2020 Document Reviewed:  02/26/2020 Elsevier Patient Education  2022 Elsevier Inc.  

## 2021-03-14 NOTE — Progress Notes (Signed)
Subjective:  Patient ID: Madison Williams, female    DOB: 01/19/1986  Age: 35 y.o. MRN: [id number]  CC: Diabetes   HPI Madison Williams is a 35 y.o. year old female with a history of hypertension, type 1 diabetes mellitus (A1c 12.2), rheumatoid arthritis, alcohol abuse, anxiety.  Interval History: Blod sugars at home are "up and down". This morning fasting was 170.  She states that when her A1c was 12.2 she had been out of her Lantus.  Endorses compliance with Lantus at this time.  She does endorse some hypoglycemic episodes around 5 AM where she is shaky and sweaty.  She administers Lantus and Humalog around the same time at dinner which is by 6 PM but has noticed that when she administers her NovoLog much later on closer to bedtime those early morning hypoglycemic episodes do not occur.  Her ankles are swollen even when she is off them and she noticed this off and on.  Endorses ingestion of foods high in sodium. Past Medical History:  Diagnosis Date   Asthma    Diabetes mellitus without complication (HCC)    Rheumatoid arthritis (HCC)     Past Surgical History:  Procedure Laterality Date   LAPAROSCOPIC OVARIAN     LEFT HEART CATH AND CORONARY ANGIOGRAPHY N/A 11/24/2019   Procedure: LEFT HEART CATH AND CORONARY ANGIOGRAPHY;  Surgeon: Kathleene Hazel, MD;  Location: [facility];  Service: Cardiovascular;  Laterality: N/A;    No family history on file.  No Known Allergies  Outpatient Medications Prior to Visit  Medication Sig Dispense Refill   chlordiazePOXIDE (LIBRIUM) 10 MG capsule Take 10 mg by mouth 2 (two) times daily as needed for anxiety.     FLUoxetine (PROZAC) 20 MG capsule Take 60 mg by mouth daily.     gabapentin (NEURONTIN) 300 MG capsule Take 300 mg by mouth 3 (three) times daily as needed. Neuropathy     HUMALOG 100 UNIT/ML injection Inject 0-15 Units into the skin 3 (three) times daily with meals. Sliding scale.     ibuprofen (ADVIL) 200 MG  tablet Take 200 mg by mouth every 6 (six) hours as needed for moderate pain.     insulin glargine (LANTUS) 100 UNIT/ML injection Inject 0.25 mLs (25 Units total) into the skin daily. 10 mL 0   insulin lispro (HUMALOG) 100 UNIT/ML injection Inject 0-9 units into the skin three times daily with meals by sliding scale 10 mL 1   Insulin Syringes, Disposable, U-100 0.5 ML MISC 1 each by Does not apply route 4 (four) times daily - after meals and at bedtime. 100 each 5   promethazine (PHENERGAN) 25 MG tablet Take 1 tablet (25 mg total) by mouth every 6 (six) hours as needed for nausea or vomiting. 30 tablet 0   metoCLOPramide (REGLAN) 5 MG tablet TAKE 1 TABLET (5 MG TOTAL) BY MOUTH 3 (THREE) TIMES DAILY BEFORE MEALS. (Patient not taking: Reported on 03/14/2021) 90 tablet 3   No facility-administered medications prior to visit.     ROS Review of Systems  Constitutional:  Negative for activity change, appetite change and fatigue.  HENT:  Negative for congestion, sinus pressure and sore throat.   Eyes:  Negative for visual disturbance.  Respiratory:  Negative for cough, chest tightness, shortness of breath and wheezing.   Cardiovascular:  Positive for leg swelling. Negative for chest pain and palpitations.  Gastrointestinal:  Negative for abdominal distention, abdominal pain and constipation.  Endocrine: Negative for polydipsia.  Genitourinary:  Negative for dysuria and frequency.  Musculoskeletal:  Negative for arthralgias and back pain.  Skin:  Negative for rash.  Neurological:  Negative for tremors, light-headedness and numbness.  Hematological:  Does not bruise/bleed easily.  Psychiatric/Behavioral:  Negative for agitation and behavioral problems.    Objective:  BP 120/67   Pulse 82   Ht  (1.702 m)   Wt 125 lb 9.6 oz (57 kg)   LMP 03/16/2018 Comment: negative urine preg test 01/14/20  SpO2 98%   BMI 19.67 kg/m   BP/Weight 03/14/2021 01/18/2021 01/15/2021  Systolic BP 120 134 -   Diastolic BP 67 84 -  Wt. (Lbs) 125.6 - 112.1  BMI 19.67 - 17.56      Physical Exam Constitutional:      Appearance: She is well-developed.  Cardiovascular:     Rate and Rhythm: Normal rate.     Heart sounds: Normal heart sounds. No murmur heard. Pulmonary:     Effort: Pulmonary effort is normal.     Breath sounds: Normal breath sounds. No wheezing or rales.  Chest:     Chest wall: No tenderness.  Abdominal:     General: Bowel sounds are normal. There is no distension.     Palpations: Abdomen is soft. There is no mass.     Tenderness: There is no abdominal tenderness.  Musculoskeletal:        General: Normal range of motion.     Right lower leg: No edema.     Left lower leg: No edema.  Neurological:     Mental Status: She is alert and oriented to person, place, and time.  Psychiatric:        Mood and Affect: Mood normal.    CMP Latest Ref Rng & Units 01/17/2021 01/17/2021 01/16/2021  Glucose 70 - 99 mg/dL 161(W) 960(A) 540(J)  BUN 6 - 20 mg/dL Creatinine 0.44 - 1.00 mg/dL 8.11 9.14 7.82  Sodium 135 - 145 mmol/L 137 138 137  Potassium 3.5 - 5.1 mmol/L 3.0(L) 3.3(L) 3.3(L)  Chloride 98 - 111 mmol/L 100 106 104  CO2 22 - 32 mmol/L Calcium 8.9 - 10.3 mg/dL 9.5(A) 8.9 9.0  Total Protein 6.5 - 8.1 g/dL - - -  Total Bilirubin 0.3 - 1.2 mg/dL - - -  Alkaline Phos 38 - 126 U/L - - -  AST 15 - 41 U/L - - -  ALT 0 - 44 U/L - - -    Lipid Panel     Component Value Date/Time   CHOL 275 (H) 02/05/2020 1152   TRIG 70 02/05/2020 1152   HDL 138 02/05/2020 1152   CHOLHDL 2.0 02/05/2020 1152   CHOLHDL 2.1 11/25/2019 0920   VLDL 16 11/25/2019 0920   LDLCALC 126 (H) 02/05/2020 1152    CBC    Component Value Date/Time   WBC 5.8 01/17/2021 1024   RBC 3.77 (L) 01/17/2021 1024   HGB 11.5 (L) 01/17/2021 1024   HCT 34.1 (L) 01/17/2021 1024   PLT 190 01/17/2021 1024   MCV 90.5 01/17/2021 1024   MCH 30.5 01/17/2021 1024   MCHC 33.7 01/17/2021 1024   RDW  14.0 01/17/2021 1024   LYMPHSABS 1.9 01/17/2021 1024   MONOABS 0.5 01/17/2021 1024   EOSABS 0.0 01/17/2021 1024   BASOSABS 0.0 01/17/2021 1024    Lab Results  Component Value Date   HGBA1C 12.2 (H) 01/16/2021    Assessment & Plan:  1. Type  1 diabetes mellitus with hyperglycemia (HCC) Uncontrolled with A1c of 12.2 She does have some hypoglycemic episodes early in the morning. Has been advised to administer Humalog with her dinner and wait till bedtime to administer her Lantus She will keep a blood sugar log which will be reviewed at her clinical pharmacy visit at which time her regimen will be adjusted Counseled on Diabetic diet, my plate method, 284 minutes of moderate intensity exercise/week Blood sugar logs with fasting goals of 80-120 mg/dl, random of less than 132 and in the event of sugars less than 60 mg/dl or greater than 440 mg/dl encouraged to notify the clinic. Advised on the need for annual eye exams, annual foot exams, Pneumonia vaccine. - POCT glucose (manual entry) - Microalbumin / creatinine urine ratio - Basic Metabolic Panel - insulin glargine (LANTUS) 100 UNIT/ML injection; Inject 0.25 mLs (25 Units total) into the skin daily.  Dispense: 30 mL; Refill: 3  2. Pedal edema Likely due to ingestion of high sodium foods which she has been counseled against Also elevate feet and use compression stockings If edema still persist despite this measures consider low-dose as needed diuretic   No orders of the defined types were placed in this encounter.   Return in about 2 weeks (around 03/28/2021) for blood sugar evaluation with Franky Macho; 3 months PCP.       Hoy Register, MD, FAAFP. [facility] [place], [place] [phone]   03/14/2021, 2:52 PM

## 2021-03-14 NOTE — Progress Notes (Signed)
Has swelling in leg and ankles.  Needs refill on insulins.

## 2021-03-15 LAB — BASIC METABOLIC PANEL
BUN/Creatinine Ratio: 15 (ref 9–23)
BUN: 8 mg/dL (ref 6–20)
CO2: 27 mmol/L (ref 20–29)
Calcium: 8.6 mg/dL — ABNORMAL LOW (ref 8.7–10.2)
Chloride: 97 mmol/L (ref 96–106)
Creatinine, Ser: 0.54 mg/dL — ABNORMAL LOW (ref 0.57–1.00)
Glucose: 188 mg/dL — ABNORMAL HIGH (ref 65–99)
Potassium: 5.1 mmol/L (ref 3.5–5.2)
Sodium: 139 mmol/L (ref 134–144)
eGFR: 124 mL/min/{1.73_m2} (ref >59)

## 2021-03-15 LAB — MICROALBUMIN / CREATININE URINE RATIO
Creatinine, Urine: 135.6 mg/dL
Microalb/Creat Ratio: 5 mg/g creat (ref 0–29)
Microalbumin, Urine: 7.2 ug/mL

## 2021-04-08 ENCOUNTER — Ambulatory Visit: Payer: Medicaid Other | Admitting: Pharmacist

## 2021-04-12 ENCOUNTER — Other Ambulatory Visit: Payer: Self-pay

## 2021-04-12 ENCOUNTER — Ambulatory Visit: Payer: Medicaid Other | Admitting: Pharmacist

## 2021-04-19 ENCOUNTER — Other Ambulatory Visit: Payer: Self-pay

## 2021-04-29 ENCOUNTER — Ambulatory Visit: Payer: Medicaid Other | Attending: Nurse Practitioner | Admitting: Nurse Practitioner

## 2021-04-29 ENCOUNTER — Encounter: Payer: Self-pay | Admitting: Nurse Practitioner

## 2021-04-29 ENCOUNTER — Other Ambulatory Visit: Payer: Self-pay

## 2021-04-29 ENCOUNTER — Telehealth: Payer: Medicaid Other | Admitting: Physician Assistant

## 2021-04-29 ENCOUNTER — Ambulatory Visit: Payer: Medicaid Other | Admitting: Nurse Practitioner

## 2021-04-29 ENCOUNTER — Other Ambulatory Visit: Payer: Self-pay | Admitting: Nurse Practitioner

## 2021-04-29 VITALS — Ht 67.0 in | Wt 125.0 lb

## 2021-04-29 DIAGNOSIS — J069 Acute upper respiratory infection, unspecified: Secondary | ICD-10-CM

## 2021-04-29 DIAGNOSIS — U071 COVID-19: Secondary | ICD-10-CM

## 2021-04-29 MED ORDER — BENZONATATE 100 MG PO CAPS
100.0000 mg | ORAL_CAPSULE | Freq: Two times a day (BID) | ORAL | 0 refills | Status: DC | PRN
  Filled 2021-04-29: qty 20, 10d supply, fill #0

## 2021-04-29 MED ORDER — MOLNUPIRAVIR EUA 200MG CAPSULE
4.0000 | ORAL_CAPSULE | Freq: Two times a day (BID) | ORAL | 0 refills | Status: AC
  Filled 2021-04-29: qty 40, 5d supply, fill #0

## 2021-04-29 NOTE — Progress Notes (Signed)
Connected with pt via video, then due to technical difficulties, called the pt and obtained the following- pt states she was scheduled to see her doctor today, but due to testing pos for Covid 19, she states she was told to have a virtual visit. She states she has PMH of DM on Insulin and Asthma not on any meds currently,  tested pos for Covid 19 today. Has had body aches, chills, sore throat, nausea, nasal drainage, fatigue, nasal congestion x 4 days. Developed shortness of breath and wheezing since yesterday, cough started today. Cough is mild and is dry. Was not exposed to anyone with Covid 19. Has been taking Tylenol and Ibuprofen. While discussing treatment plan with pt, pt stated her doctor called her and informed her that she has send her medication for symptoms to her pharmacy and thus she did not need any meds to be sent by me.

## 2021-04-29 NOTE — Progress Notes (Deleted)
Virtual Visit via Video Note  I connected with Madison Williams on 04/29/21 at 12:30 PM EDT by a video enabled telemedicine application and verified that I am speaking with the correct person using two identifiers.  Location: Patient: Patient's home Provider: Provider's office  Person participating in the virtual visit: Patient and provider    I discussed the limitations of evaluation and management by telemedicine and the availability of in person appointments. The patient expressed understanding and agreed to proceed.  I discussed the assessment and treatment plan with the patient. The patient was provided an opportunity to ask questions and all were answered. The patient agreed with the plan and demonstrated an understanding of the instructions.   The patient was advised to call back or seek an in-person evaluation if the symptoms worsen or if the condition fails to improve as anticipated.  I provided 15 minutes of non-face-to-face time during this encounter.   Waldon Merl, PA-C   Acute Office Visit  Subjective:    Patient ID: Madison Williams, female    DOB: [birth year], 35 y.o.   MRN: [id number]  No chief complaint on file.   HPI Patient is in today for tested pos for Covid 19  Past Medical History:  Diagnosis Date   Asthma    Diabetes mellitus without complication ([facility])    Rheumatoid arthritis ([place])     Past Surgical History:  Procedure Laterality Date   LAPAROSCOPIC OVARIAN     LEFT HEART CATH AND CORONARY ANGIOGRAPHY N/A 11/24/2019   Procedure: LEFT HEART CATH AND CORONARY ANGIOGRAPHY;  Surgeon: Burnell Blanks, MD;  Location: [facility];  Service: Cardiovascular;  Laterality: N/A;    History reviewed. No pertinent family history.  Social History   Socioeconomic History   Marital status: Single    Spouse name: Not on file   Number of children: 1   Years of education: Not on file   Highest education level: Not on file   Occupational History   Not on file  Tobacco Use   Smoking status: Former    Packs/day: 1.00    Types: Cigarettes   Smokeless tobacco: Never  Vaping Use   Vaping Use: Every day   Substances: Nicotine  Substance and Sexual Activity   Alcohol use: Not Currently   Drug use: Yes    Comment: Kratum   Sexual activity: Not on file  Other Topics Concern   Not on file  Social History Narrative   Not on file   Social Determinants of Health   Financial Resource Strain: Not on file  Food Insecurity: Not on file  Transportation Needs: Not on file  Physical Activity: Not on file  Stress: Not on file  Social Connections: Not on file  Intimate Partner Violence: Not on file    Outpatient Medications Prior to Visit  Medication Sig Dispense Refill   chlordiazePOXIDE (LIBRIUM) 10 MG capsule Take 10 mg by mouth 2 (two) times daily as needed for anxiety.     FLUoxetine (PROZAC) 20 MG capsule Take 60 mg by mouth daily.     gabapentin (NEURONTIN) 300 MG capsule Take 300 mg by mouth 3 (three) times daily as needed. Neuropathy     HUMALOG 100 UNIT/ML injection Inject 0-15 Units into the skin 3 (three) times daily with meals. Sliding scale.     ibuprofen (ADVIL) 200 MG tablet Take 200 mg by mouth every 6 (six) hours as needed for moderate pain.     insulin glargine (  LANTUS) 100 UNIT/ML injection Inject 0.25 mLs (25 Units total) into the skin daily. 30 mL 3   insulin lispro (HUMALOG) 100 UNIT/ML injection Inject 0-9 units into the skin three times daily with meals by sliding scale 10 mL 1   Insulin Syringes, Disposable, U-100 0.5 ML MISC 1 each by Does not apply route 4 (four) times daily - after meals and at bedtime. 100 each 5   metoCLOPramide (REGLAN) 5 MG tablet TAKE 1 TABLET (5 MG TOTAL) BY MOUTH 3 (THREE) TIMES DAILY BEFORE MEALS. (Patient not taking: No sig reported) 90 tablet 3   promethazine (PHENERGAN) 25 MG tablet Take 1 tablet (25 mg total) by mouth every 6 (six) hours as needed for nausea  or vomiting. 30 tablet 0   No facility-administered medications prior to visit.    No Known Allergies  Review of Systems     Objective:    Physical Exam  LMP 03/16/2018 Comment: negative urine preg test 01/14/20 Wt Readings from Last 3 Encounters:  04/29/21 125 lb (56.7 kg)  03/14/21 125 lb 9.6 oz (57 kg)  01/15/21 112 lb 1.6 oz (50.8 kg)    Health Maintenance Due  Topic Date Due   Hepatitis C Screening  Never done   TETANUS/TDAP  Never done   PAP SMEAR-Modifier  Never done   COVID-19 Vaccine (3 - Booster for Moderna series) 08/26/2020   INFLUENZA VACCINE  Never done    There are no preventive care reminders to display for this patient.   Lab Results  Component Value Date   TSH 2.695 04/04/2019   Lab Results  Component Value Date   WBC 5.8 01/17/2021   HGB 11.5 (L) 01/17/2021   HCT 34.1 (L) 01/17/2021   MCV 90.5 01/17/2021   PLT 190 01/17/2021   Lab Results  Component Value Date   NA 139 03/14/2021   K 5.1 03/14/2021   CO2 27 03/14/2021   GLUCOSE 188 (H) 03/14/2021   BUN 8 03/14/2021   CREATININE 0.54 (L) 03/14/2021   BILITOT 2.3 (H) 01/15/2021   ALKPHOS 69 01/15/2021   AST 12 (L) 01/15/2021   ALT 17 01/15/2021   PROT 7.2 01/15/2021   ALBUMIN 4.2 01/15/2021   CALCIUM 8.6 (L) 03/14/2021   ANIONGAP 14 01/17/2021   EGFR 124 03/14/2021   Lab Results  Component Value Date   CHOL 275 (H) 02/05/2020   Lab Results  Component Value Date   HDL 138 02/05/2020   Lab Results  Component Value Date   LDLCALC 126 (H) 02/05/2020   Lab Results  Component Value Date   TRIG 70 02/05/2020   Lab Results  Component Value Date   CHOLHDL 2.0 02/05/2020   Lab Results  Component Value Date   HGBA1C 12.2 (H) 01/16/2021       Assessment & Plan:   Problem List Items Addressed This Visit   None    No orders of the defined types were placed in this encounter.    Waldon Merl, PA-C

## 2021-04-29 NOTE — Progress Notes (Addendum)
Virtual Visit via Telephone Note Due to national recommendations of social distancing due to COVID 19, telehealth visit is felt to be most appropriate for this patient at this time.  I discussed the limitations, risks, security and privacy concerns of performing an evaluation and management service by telephone and the availability of in person appointments. I also discussed with the patient that there may be a patient responsible charge related to this service. The patient expressed understanding and agreed to proceed.    I connected with Madison Williams on 04/29/21  at  11:10 AM EDT  EDT by telephone and verified that I am speaking with the correct person using two identifiers.  Location of Patient: Private Residence   Location of Provider: [facility]    Persons participating in Telemedicine visit: Bertram Denver FNP-BC [place] Rob Bunting    History of Present Illness: Telemedicine visit for: Upper respiratory Illness  Onset of symptoms 4 days ago. Sore throat, chills congestion, body aches, cough, shortness of breath. Coworker diagnosed with strep throat 2 days ago. Taking tylenol cold and flu which is only providing a minimal amount of relief. She has been vaccinated but has not received the booster dose of COVID vaccine    Past Medical History:  Diagnosis Date   Asthma    Diabetes mellitus without complication ([facility])    Rheumatoid arthritis ([facility])     Past Surgical History:  Procedure Laterality Date   LAPAROSCOPIC OVARIAN     LEFT HEART CATH AND CORONARY ANGIOGRAPHY N/A 11/24/2019   Procedure: LEFT HEART CATH AND CORONARY ANGIOGRAPHY;  Surgeon: Kathleene Hazel, MD;  Location: [facility];  Service: Cardiovascular;  Laterality: N/A;    History reviewed. No pertinent family history.  Social History   Socioeconomic History   Marital status: Single    Spouse name: Not on file   Number of children: 1   Years of education:  Not on file   Highest education level: Not on file  Occupational History   Not on file  Tobacco Use   Smoking status: Former    Packs/day: 1.00    Types: Cigarettes   Smokeless tobacco: Never  Vaping Use   Vaping Use: Every day   Substances: Nicotine  Substance and Sexual Activity   Alcohol use: Not Currently   Drug use: Yes    Comment: Kratum   Sexual activity: Not on file  Other Topics Concern   Not on file  Social History Narrative   Not on file   Social Determinants of Health   Financial Resource Strain: Not on file  Food Insecurity: Not on file  Transportation Needs: Not on file  Physical Activity: Not on file  Stress: Not on file  Social Connections: Not on file     Observations/Objective: Awake, alert and oriented x 3   Review of Systems  Constitutional:  Positive for chills, fever and malaise/fatigue.  HENT:  Positive for congestion, sinus pain and sore throat. Negative for ear discharge, ear pain and hearing loss.   Eyes: Negative.   Respiratory:  Positive for cough. Negative for sputum production, shortness of breath and wheezing.   Cardiovascular: Negative.  Negative for chest pain, orthopnea and leg swelling.  Gastrointestinal: Negative.  Negative for abdominal pain, diarrhea, nausea and vomiting.  Neurological:  Negative for dizziness and focal weakness.  Endo/Heme/Allergies:  Negative for environmental allergies.  Psychiatric/Behavioral: Negative.     Assessment and Plan: Thomas was seen today for sore throat.  Diagnoses and all orders for this visit:  Upper respiratory infection, viral  Continue conservative symptomatic treatment She will upload covid test results vs mychart.   Follow Up Instructions Return if symptoms worsen or fail to improve.     I discussed the assessment and treatment plan with the patient. The patient was provided an opportunity to ask questions and all were answered. The patient agreed with the plan and demonstrated an  understanding of the instructions.   The patient was advised to call back or seek an in-person evaluation if the symptoms worsen or if the condition fails to improve as anticipated.  I provided 8 minutes of non-face-to-face time during this encounter including median intraservice time, reviewing previous notes, labs, imaging, medications and explaining diagnosis and management.  Claiborne Rigg, FNP-BC

## 2021-05-27 ENCOUNTER — Other Ambulatory Visit: Payer: Self-pay

## 2021-05-30 ENCOUNTER — Other Ambulatory Visit: Payer: Self-pay

## 2021-06-06 ENCOUNTER — Ambulatory Visit: Payer: Medicaid Other | Admitting: Physician Assistant

## 2021-06-14 ENCOUNTER — Ambulatory Visit: Payer: Medicaid Other | Admitting: Family Medicine

## 2021-07-01 ENCOUNTER — Ambulatory Visit: Payer: Self-pay

## 2021-07-01 NOTE — Telephone Encounter (Signed)
Pt. States she has had chest pain x 2 weeks. Hurts on right side at breast and ribs. Went to ED 06/27/21, but left due to long wait time. Has chest pain and shortness of breath now. States "it doesn't feel like my heart. I had a heart attack last year." Instructed pt. To call 911 and go to ED. Refuses. "I want an appointment today." Please advise pt.    Reason for Disposition  Sounds like a life-threatening emergency to the triager  Answer Assessment - Initial Assessment Questions 1. LOCATION: "Where does it hurt?"       On right side 2. RADIATION: "Does the pain go anywhere else?" (e.g., into neck, jaw, arms, back)     Breast area and ribs 3. ONSET: "When did the chest pain begin?" (Minutes, hours or days)      2 weeks ago 4. PATTERN "Does the pain come and go, or has it been constant since it started?"  "Does it get worse with exertion?"      Comes and goes 5. DURATION: "How long does it last" (e.g., seconds, minutes, hours)     Constant now 6. SEVERITY: "How bad is the pain?"  (e.g., Scale 1-10; mild, moderate, or severe)    - MILD (1-3): doesn't interfere with normal activities     - MODERATE (4-7): interferes with normal activities or awakens from sleep    - SEVERE (8-10): excruciating pain, unable to do any normal activities       Now - 5 7. CARDIAC RISK FACTORS: "Do you have any history of heart problems or risk factors for heart disease?" (e.g., angina, prior heart attack; diabetes, high blood pressure, high cholesterol, smoker, or strong family history of heart disease)     Yes - "heart attack last year" 8. PULMONARY RISK FACTORS: "Do you have any history of lung disease?"  (e.g., blood clots in lung, asthma, emphysema, birth control pills)     Asthma 9. CAUSE: "What do you think is causing the chest pain?"     Unsure 10. OTHER SYMPTOMS: "Do you have any other symptoms?" (e.g., dizziness, nausea, vomiting, sweating, fever, difficulty breathing, cough)       Shortness of  breath 11. PREGNANCY: "Is there any chance you are pregnant?" "When was your last menstrual period?"       No  Protocols used: Chest Pain-A-AH

## 2021-07-01 NOTE — Telephone Encounter (Signed)
Informed of [facility] options for next week, 07/04/2021. Scheduled apt in Dec.  Patient aware she can call office for cancelled apt as well.

## 2021-07-03 ENCOUNTER — Encounter (HOSPITAL_COMMUNITY): Payer: Self-pay

## 2021-07-03 ENCOUNTER — Other Ambulatory Visit: Payer: Self-pay

## 2021-07-03 ENCOUNTER — Inpatient Hospital Stay (HOSPITAL_COMMUNITY)
Admission: EM | Admit: 2021-07-03 | Discharge: 2021-07-07 | DRG: 637 | Disposition: A | Discharge status: Home / Self Care | Payer: Medicaid Other | Attending: Internal Medicine | Admitting: Internal Medicine

## 2021-07-03 ENCOUNTER — Emergency Department (HOSPITAL_COMMUNITY): Payer: Medicaid Other

## 2021-07-03 DIAGNOSIS — E876 Hypokalemia: Secondary | ICD-10-CM | POA: Diagnosis not present

## 2021-07-03 DIAGNOSIS — I252 Old myocardial infarction: Secondary | ICD-10-CM

## 2021-07-03 DIAGNOSIS — N179 Acute kidney failure, unspecified: Secondary | ICD-10-CM | POA: Diagnosis not present

## 2021-07-03 DIAGNOSIS — Z79899 Other long term (current) drug therapy: Secondary | ICD-10-CM

## 2021-07-03 DIAGNOSIS — Z87891 Personal history of nicotine dependence: Secondary | ICD-10-CM

## 2021-07-03 DIAGNOSIS — E1010 Type 1 diabetes mellitus with ketoacidosis without coma: Secondary | ICD-10-CM | POA: Diagnosis not present

## 2021-07-03 DIAGNOSIS — R6521 Severe sepsis with septic shock: Secondary | ICD-10-CM

## 2021-07-03 DIAGNOSIS — J4520 Mild intermittent asthma, uncomplicated: Secondary | ICD-10-CM | POA: Diagnosis present

## 2021-07-03 DIAGNOSIS — E1011 Type 1 diabetes mellitus with ketoacidosis with coma: Principal | ICD-10-CM | POA: Diagnosis present

## 2021-07-03 DIAGNOSIS — E860 Dehydration: Secondary | ICD-10-CM | POA: Diagnosis present

## 2021-07-03 DIAGNOSIS — G9341 Metabolic encephalopathy: Secondary | ICD-10-CM | POA: Diagnosis present

## 2021-07-03 DIAGNOSIS — E10649 Type 1 diabetes mellitus with hypoglycemia without coma: Secondary | ICD-10-CM | POA: Diagnosis not present

## 2021-07-03 DIAGNOSIS — F32A Depression, unspecified: Secondary | ICD-10-CM | POA: Diagnosis present

## 2021-07-03 DIAGNOSIS — R6510 Systemic inflammatory response syndrome (SIRS) of non-infectious origin without acute organ dysfunction: Secondary | ICD-10-CM | POA: Diagnosis present

## 2021-07-03 DIAGNOSIS — Z8616 Personal history of COVID-19: Secondary | ICD-10-CM

## 2021-07-03 DIAGNOSIS — E1110 Type 2 diabetes mellitus with ketoacidosis without coma: Secondary | ICD-10-CM | POA: Diagnosis present

## 2021-07-03 DIAGNOSIS — E875 Hyperkalemia: Secondary | ICD-10-CM | POA: Diagnosis present

## 2021-07-03 DIAGNOSIS — A419 Sepsis, unspecified organism: Secondary | ICD-10-CM | POA: Diagnosis not present

## 2021-07-03 DIAGNOSIS — I5022 Chronic systolic (congestive) heart failure: Secondary | ICD-10-CM | POA: Diagnosis present

## 2021-07-03 DIAGNOSIS — Z23 Encounter for immunization: Secondary | ICD-10-CM

## 2021-07-03 DIAGNOSIS — E1043 Type 1 diabetes mellitus with diabetic autonomic (poly)neuropathy: Secondary | ICD-10-CM | POA: Diagnosis present

## 2021-07-03 DIAGNOSIS — B954 Other streptococcus as the cause of diseases classified elsewhere: Secondary | ICD-10-CM | POA: Diagnosis present

## 2021-07-03 DIAGNOSIS — R823 Hemoglobinuria: Secondary | ICD-10-CM | POA: Diagnosis present

## 2021-07-03 DIAGNOSIS — K3184 Gastroparesis: Secondary | ICD-10-CM | POA: Diagnosis present

## 2021-07-03 DIAGNOSIS — Z794 Long term (current) use of insulin: Secondary | ICD-10-CM

## 2021-07-03 DIAGNOSIS — R778 Other specified abnormalities of plasma proteins: Secondary | ICD-10-CM | POA: Diagnosis present

## 2021-07-03 DIAGNOSIS — R809 Proteinuria, unspecified: Secondary | ICD-10-CM | POA: Diagnosis present

## 2021-07-03 DIAGNOSIS — M6250 Muscle wasting and atrophy, not elsewhere classified, unspecified site: Secondary | ICD-10-CM | POA: Diagnosis present

## 2021-07-03 DIAGNOSIS — Z20822 Contact with and (suspected) exposure to covid-19: Secondary | ICD-10-CM | POA: Diagnosis present

## 2021-07-03 DIAGNOSIS — I110 Hypertensive heart disease with heart failure: Secondary | ICD-10-CM | POA: Diagnosis present

## 2021-07-03 DIAGNOSIS — F17290 Nicotine dependence, other tobacco product, uncomplicated: Secondary | ICD-10-CM | POA: Diagnosis present

## 2021-07-03 DIAGNOSIS — R571 Hypovolemic shock: Secondary | ICD-10-CM | POA: Diagnosis present

## 2021-07-03 DIAGNOSIS — M069 Rheumatoid arthritis, unspecified: Secondary | ICD-10-CM | POA: Diagnosis present

## 2021-07-03 DIAGNOSIS — F419 Anxiety disorder, unspecified: Secondary | ICD-10-CM | POA: Diagnosis present

## 2021-07-03 DIAGNOSIS — D638 Anemia in other chronic diseases classified elsewhere: Secondary | ICD-10-CM | POA: Diagnosis present

## 2021-07-03 DIAGNOSIS — E1065 Type 1 diabetes mellitus with hyperglycemia: Secondary | ICD-10-CM | POA: Diagnosis present

## 2021-07-03 DIAGNOSIS — M549 Dorsalgia, unspecified: Secondary | ICD-10-CM | POA: Diagnosis present

## 2021-07-03 LAB — LACTIC ACID, PLASMA
Lactic Acid, Venous: 3.2 mmol/L (ref 0.5–1.9)
Lactic Acid, Venous: 2.1 mmol/L (ref 0.5–1.9)
Lactic Acid, Venous: 1.8 mmol/L (ref 0.5–1.9)

## 2021-07-03 LAB — URINALYSIS, ROUTINE W REFLEX MICROSCOPIC
Bilirubin Urine: NEGATIVE
Glucose, UA: >=500 mg/dL — AB
Ketones, ur: 20 mg/dL — AB
Nitrite: NEGATIVE
Protein, ur: 30 mg/dL — AB
Specific Gravity, Urine: 1.015 (ref 1.005–1.030)
pH: 5 (ref 5.0–8.0)

## 2021-07-03 LAB — COMPREHENSIVE METABOLIC PANEL
ALT: 40 U/L (ref 0–44)
AST: 28 U/L (ref 15–41)
Albumin: 4 g/dL (ref 3.5–5.0)
Alkaline Phosphatase: 137 U/L — ABNORMAL HIGH (ref 38–126)
BUN: 59 mg/dL — ABNORMAL HIGH (ref 6–20)
CO2: <7 mmol/L — ABNORMAL LOW (ref 22–32)
Calcium: 7.1 mg/dL — ABNORMAL LOW (ref 8.9–10.3)
Chloride: 80 mmol/L — ABNORMAL LOW (ref 98–111)
Creatinine, Ser: 4.7 mg/dL — ABNORMAL HIGH (ref 0.44–1.00)
GFR, Estimated: 12 mL/min — ABNORMAL LOW (ref >60)
Glucose, Bld: 1498 mg/dL (ref 70–99)
Potassium: 6.9 mmol/L (ref 3.5–5.1)
Sodium: 123 mmol/L — ABNORMAL LOW (ref 135–145)
Total Bilirubin: 2.5 mg/dL — ABNORMAL HIGH (ref 0.3–1.2)
Total Protein: 6.8 g/dL (ref 6.5–8.1)

## 2021-07-03 LAB — CBC WITH DIFFERENTIAL/PLATELET
Abs Immature Granulocytes: 1.02 10*3/uL — ABNORMAL HIGH (ref 0.00–0.07)
Basophils Absolute: 0.1 10*3/uL (ref 0.0–0.1)
Basophils Relative: 0 %
Eosinophils Absolute: 0 10*3/uL (ref 0.0–0.5)
Eosinophils Relative: 0 %
HCT: 43.2 % (ref 36.0–46.0)
Hemoglobin: 11.8 g/dL — ABNORMAL LOW (ref 12.0–15.0)
Immature Granulocytes: 4 %
Lymphocytes Relative: 17 %
Lymphs Abs: 4.1 10*3/uL — ABNORMAL HIGH (ref 0.7–4.0)
MCH: 30.7 pg (ref 26.0–34.0)
MCHC: 27.3 g/dL — ABNORMAL LOW (ref 30.0–36.0)
MCV: 112.5 fL — ABNORMAL HIGH (ref 80.0–100.0)
Monocytes Absolute: 1.7 10*3/uL — ABNORMAL HIGH (ref 0.1–1.0)
Monocytes Relative: 7 %
Neutro Abs: 17.4 10*3/uL — ABNORMAL HIGH (ref 1.7–7.7)
Neutrophils Relative %: 72 %
Platelets: 304 10*3/uL (ref 150–400)
RBC: 3.84 MIL/uL — ABNORMAL LOW (ref 3.87–5.11)
RDW: 15 % (ref 11.5–15.5)
WBC: 24.3 10*3/uL — ABNORMAL HIGH (ref 4.0–10.5)
nRBC: 0 % (ref 0.0–0.2)

## 2021-07-03 LAB — BASIC METABOLIC PANEL
Anion gap: 27 — ABNORMAL HIGH (ref 5–15)
Anion gap: 20 — ABNORMAL HIGH (ref 5–15)
BUN: 61 mg/dL — ABNORMAL HIGH (ref 6–20)
BUN: 53 mg/dL — ABNORMAL HIGH (ref 6–20)
BUN: 51 mg/dL — ABNORMAL HIGH (ref 6–20)
CO2: <7 mmol/L — ABNORMAL LOW (ref 22–32)
CO2: 11 mmol/L — ABNORMAL LOW (ref 22–32)
CO2: 17 mmol/L — ABNORMAL LOW (ref 22–32)
Calcium: 7 mg/dL — ABNORMAL LOW (ref 8.9–10.3)
Calcium: 7.3 mg/dL — ABNORMAL LOW (ref 8.9–10.3)
Calcium: 7.5 mg/dL — ABNORMAL LOW (ref 8.9–10.3)
Chloride: 95 mmol/L — ABNORMAL LOW (ref 98–111)
Chloride: 102 mmol/L (ref 98–111)
Chloride: 107 mmol/L (ref 98–111)
Creatinine, Ser: 4.19 mg/dL — ABNORMAL HIGH (ref 0.44–1.00)
Creatinine, Ser: 3.42 mg/dL — ABNORMAL HIGH (ref 0.44–1.00)
Creatinine, Ser: 2.6 mg/dL — ABNORMAL HIGH (ref 0.44–1.00)
GFR, Estimated: 14 mL/min — ABNORMAL LOW (ref >60)
GFR, Estimated: 17 mL/min — ABNORMAL LOW (ref >60)
GFR, Estimated: 24 mL/min — ABNORMAL LOW (ref >60)
Glucose, Bld: 1016 mg/dL (ref 70–99)
Glucose, Bld: 518 mg/dL (ref 70–99)
Glucose, Bld: 241 mg/dL — ABNORMAL HIGH (ref 70–99)
Potassium: 4.2 mmol/L (ref 3.5–5.1)
Potassium: 4 mmol/L (ref 3.5–5.1)
Potassium: 3.6 mmol/L (ref 3.5–5.1)
Sodium: 134 mmol/L — ABNORMAL LOW (ref 135–145)
Sodium: 140 mmol/L (ref 135–145)
Sodium: 144 mmol/L (ref 135–145)

## 2021-07-03 LAB — BLOOD GAS, ARTERIAL
Bicarbonate: 2 mmol/L — ABNORMAL LOW (ref 20.0–28.0)
O2 Saturation: 96.4 %
Patient temperature: 37
pCO2 arterial: 10.7 mmHg — CL (ref 32.0–48.0)
pH, Arterial: 6.894 — CL (ref 7.350–7.450)
pO2, Arterial: 165 mmHg — ABNORMAL HIGH (ref 83.0–108.0)

## 2021-07-03 LAB — I-STAT CHEM 8, ED
BUN: 51 mg/dL — ABNORMAL HIGH (ref 6–20)
Calcium, Ion: 0.9 mmol/L — ABNORMAL LOW (ref 1.15–1.40)
Chloride: 95 mmol/L — ABNORMAL LOW (ref 98–111)
Creatinine, Ser: 4.2 mg/dL — ABNORMAL HIGH (ref 0.44–1.00)
Glucose, Bld: >700 mg/dL (ref 70–99)
HCT: 41 % (ref 36.0–46.0)
Hemoglobin: 13.9 g/dL (ref 12.0–15.0)
Potassium: 6.7 mmol/L (ref 3.5–5.1)
Sodium: 124 mmol/L — ABNORMAL LOW (ref 135–145)
TCO2: 6 mmol/L — ABNORMAL LOW (ref 22–32)

## 2021-07-03 LAB — CBG MONITORING, ED
Glucose-Capillary: >600 mg/dL (ref 70–99)
Glucose-Capillary: >600 mg/dL (ref 70–99)
Glucose-Capillary: >600 mg/dL (ref 70–99)
Glucose-Capillary: >600 mg/dL (ref 70–99)
Glucose-Capillary: >600 mg/dL (ref 70–99)

## 2021-07-03 LAB — HCG, SERUM, QUALITATIVE: Preg, Serum: NEGATIVE

## 2021-07-03 LAB — RESP PANEL BY RT-PCR (FLU A&B, COVID) ARPGX2
Influenza A by PCR: NEGATIVE
Influenza B by PCR: NEGATIVE
SARS Coronavirus 2 by RT PCR: NEGATIVE

## 2021-07-03 LAB — BETA-HYDROXYBUTYRIC ACID
Beta-Hydroxybutyric Acid: >8 mmol/L — ABNORMAL HIGH (ref 0.05–0.27)
Beta-Hydroxybutyric Acid: >8 mmol/L — ABNORMAL HIGH (ref 0.05–0.27)

## 2021-07-03 LAB — GLUCOSE, CAPILLARY
Glucose-Capillary: 183 mg/dL — ABNORMAL HIGH (ref 70–99)
Glucose-Capillary: 193 mg/dL — ABNORMAL HIGH (ref 70–99)
Glucose-Capillary: 194 mg/dL — ABNORMAL HIGH (ref 70–99)
Glucose-Capillary: 223 mg/dL — ABNORMAL HIGH (ref 70–99)
Glucose-Capillary: 268 mg/dL — ABNORMAL HIGH (ref 70–99)
Glucose-Capillary: 314 mg/dL — ABNORMAL HIGH (ref 70–99)
Glucose-Capillary: 353 mg/dL — ABNORMAL HIGH (ref 70–99)
Glucose-Capillary: 396 mg/dL — ABNORMAL HIGH (ref 70–99)
Glucose-Capillary: 447 mg/dL — ABNORMAL HIGH (ref 70–99)
Glucose-Capillary: 473 mg/dL — ABNORMAL HIGH (ref 70–99)
Glucose-Capillary: 531 mg/dL (ref 70–99)
Glucose-Capillary: >600 mg/dL (ref 70–99)

## 2021-07-03 LAB — PHOSPHORUS: Phosphorus: 8.4 mg/dL — ABNORMAL HIGH (ref 2.5–4.6)

## 2021-07-03 LAB — MAGNESIUM: Magnesium: 3.1 mg/dL — ABNORMAL HIGH (ref 1.7–2.4)

## 2021-07-03 LAB — I-STAT BETA HCG BLOOD, ED (MC, WL, AP ONLY): I-stat hCG, quantitative: <5 m[IU]/mL (ref <5)

## 2021-07-03 LAB — TROPONIN I (HIGH SENSITIVITY)
Troponin I (High Sensitivity): 271 ng/L (ref <18)
Troponin I (High Sensitivity): 815 ng/L (ref <18)

## 2021-07-03 LAB — PROCALCITONIN: Procalcitonin: 2.89 ng/mL

## 2021-07-03 LAB — MRSA NEXT GEN BY PCR, NASAL: MRSA by PCR Next Gen: NOT DETECTED

## 2021-07-03 LAB — D-DIMER, QUANTITATIVE: D-Dimer, Quant: 1.92 ug/mL-FEU — ABNORMAL HIGH (ref 0.00–0.50)

## 2021-07-03 LAB — HCG, QUANTITATIVE, PREGNANCY: hCG, Beta Chain, Quant, S: <1 m[IU]/mL (ref <5)

## 2021-07-03 LAB — LIPASE, BLOOD: Lipase: 45 U/L (ref 11–51)

## 2021-07-03 MED ORDER — CALCIUM GLUCONATE-NACL 1-0.675 GM/50ML-% IV SOLN
1.0000 g | Freq: Once | INTRAVENOUS | Status: AC
  Administered 2021-07-03: 11:00:00 1000 mg via INTRAVENOUS
  Filled 2021-07-03: qty 50

## 2021-07-03 MED ORDER — ONDANSETRON HCL 4 MG/2ML IJ SOLN: 4.0000 mg | Freq: Four times a day (QID) | INTRAMUSCULAR | Status: DC | PRN

## 2021-07-03 MED ORDER — SODIUM CHLORIDE 0.9 % IV SOLN
25.0000 mg | Freq: Four times a day (QID) | INTRAVENOUS | Status: DC | PRN
  Administered 2021-07-04 – 2021-07-06 (×7): 25 mg via INTRAVENOUS
  Filled 2021-07-03 (×2): qty 25
  Filled 2021-07-03: qty 1
  Filled 2021-07-03 (×3): qty 25
  Filled 2021-07-03: qty 1
  Filled 2021-07-03: qty 25

## 2021-07-03 MED ORDER — ACETAMINOPHEN 325 MG PO TABS
650.0000 mg | ORAL_TABLET | Freq: Four times a day (QID) | ORAL | Status: DC | PRN
  Administered 2021-07-06: 650 mg via ORAL
  Filled 2021-07-03 (×2): qty 2

## 2021-07-03 MED ORDER — METRONIDAZOLE 500 MG/100ML IV SOLN
500.0000 mg | Freq: Two times a day (BID) | INTRAVENOUS | Status: DC
  Administered 2021-07-03 – 2021-07-04 (×2): 500 mg via INTRAVENOUS
  Filled 2021-07-03 (×2): qty 100

## 2021-07-03 MED ORDER — ONDANSETRON HCL 4 MG PO TABS: 4.0000 mg | ORAL_TABLET | Freq: Four times a day (QID) | ORAL | Status: DC | PRN

## 2021-07-03 MED ORDER — INSULIN REGULAR(HUMAN) IN NACL 100-0.9 UT/100ML-% IV SOLN: INTRAVENOUS | Status: DC

## 2021-07-03 MED ORDER — LACTATED RINGERS IV BOLUS
20.0000 mL/kg | Freq: Once | INTRAVENOUS | Status: AC
  Administered 2021-07-03: 11:00:00 1100 mL via INTRAVENOUS

## 2021-07-03 MED ORDER — LACTATED RINGERS IV BOLUS
2000.0000 mL | Freq: Once | INTRAVENOUS | Status: AC
  Administered 2021-07-03: 12:00:00 2000 mL via INTRAVENOUS

## 2021-07-03 MED ORDER — ASPIRIN 300 MG RE SUPP
300.0000 mg | Freq: Once | RECTAL | Status: AC
  Administered 2021-07-03: 23:00:00 300 mg via RECTAL
  Filled 2021-07-03: qty 1

## 2021-07-03 MED ORDER — LACTATED RINGERS IV SOLN: INTRAVENOUS | Status: DC

## 2021-07-03 MED ORDER — VANCOMYCIN HCL 750 MG/150ML IV SOLN: 750.0000 mg | INTRAVENOUS | Status: DC

## 2021-07-03 MED ORDER — ENOXAPARIN SODIUM 30 MG/0.3ML IJ SOSY
30.0000 mg | PREFILLED_SYRINGE | INTRAMUSCULAR | Status: DC
  Administered 2021-07-03: 21:00:00 30 mg via SUBCUTANEOUS
  Filled 2021-07-03: qty 0.3

## 2021-07-03 MED ORDER — DEXTROSE 50 % IV SOLN: 0.0000 mL | INTRAVENOUS | Status: DC | PRN

## 2021-07-03 MED ORDER — DEXTROSE IN LACTATED RINGERS 5 % IV SOLN: INTRAVENOUS | Status: DC

## 2021-07-03 MED ORDER — LACTATED RINGERS IV BOLUS
1000.0000 mL | Freq: Once | INTRAVENOUS | Status: AC
  Administered 2021-07-03: 18:00:00 1000 mL via INTRAVENOUS

## 2021-07-03 MED ORDER — SODIUM CHLORIDE 0.9 % IV SOLN
2.0000 g | INTRAVENOUS | Status: DC
  Administered 2021-07-03: 16:00:00 2 g via INTRAVENOUS
  Filled 2021-07-03: qty 2

## 2021-07-03 MED ORDER — SODIUM CHLORIDE 0.9 % IV SOLN
2.0000 g | Freq: Once | INTRAVENOUS | Status: AC
  Administered 2021-07-03: 11:00:00 2 g via INTRAVENOUS
  Filled 2021-07-03: qty 20

## 2021-07-03 MED ORDER — CHLORHEXIDINE GLUCONATE CLOTH 2 % EX PADS
6.0000 | MEDICATED_PAD | Freq: Every day | CUTANEOUS | Status: DC
  Administered 2021-07-03 – 2021-07-05 (×3): 6 via TOPICAL

## 2021-07-03 MED ORDER — DEXTROSE-NACL 5-0.45 % IV SOLN: INTRAVENOUS | Status: AC

## 2021-07-03 MED ORDER — INSULIN REGULAR(HUMAN) IN NACL 100-0.9 UT/100ML-% IV SOLN
INTRAVENOUS | Status: DC
  Administered 2021-07-03: 11:00:00 4.6 [IU]/h via INTRAVENOUS
  Administered 2021-07-04: 2 [IU]/h via INTRAVENOUS
  Filled 2021-07-03 (×2): qty 100

## 2021-07-03 MED ORDER — PROMETHAZINE HCL 12.5 MG RE SUPP
12.5000 mg | Freq: Four times a day (QID) | RECTAL | Status: DC | PRN
Start: 2021-07-03 — End: 2021-07-07
  Filled 2021-07-03: qty 2

## 2021-07-03 MED ORDER — ACETAMINOPHEN 650 MG RE SUPP: 650.0000 mg | Freq: Four times a day (QID) | RECTAL | Status: DC | PRN

## 2021-07-03 MED ORDER — PANTOPRAZOLE SODIUM 40 MG IV SOLR
40.0000 mg | INTRAVENOUS | Status: DC
  Administered 2021-07-03 – 2021-07-05 (×3): 40 mg via INTRAVENOUS
  Filled 2021-07-03 (×3): qty 40

## 2021-07-03 MED ORDER — VANCOMYCIN HCL 1000 MG/200ML IV SOLN
1000.0000 mg | Freq: Once | INTRAVENOUS | Status: AC
  Administered 2021-07-03: 17:00:00 1000 mg via INTRAVENOUS
  Filled 2021-07-03: qty 200

## 2021-07-03 MED ORDER — SODIUM ZIRCONIUM CYCLOSILICATE 10 G PO PACK: 10.0000 g | PACK | Freq: Once | ORAL | Status: DC

## 2021-07-03 MED ORDER — LACTATED RINGERS IV BOLUS: 20.0000 mL/kg | Freq: Once | INTRAVENOUS | Status: DC

## 2021-07-03 MED ORDER — LACTATED RINGERS IV BOLUS
1000.0000 mL | INTRAVENOUS | Status: AC
  Administered 2021-07-03 (×2): 1000 mL via INTRAVENOUS

## 2021-07-03 NOTE — Progress Notes (Signed)
eLink Physician-Brief Progress Note Patient Name: Madison Williams DOB: 1986-05-22 MRN: [id number]   Date of Service  07/03/2021  HPI/Events of Note  RN notified about troponin > 800, was < 250 on admit and CBG 220. On LR.  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 Rt sided cj\hest pain.  EKG: sinus tachy, qtc 454 Leukocytosis, CxR neg, UA neg.  ECHO 2021 45%. LFT, lipase normal.  Camera: On room air. VS stable.  Elevated troponin, NSTEMI?Marland Kitchen  Hx of STEMI, recent covid. Rt sided chest painhx of RA. Type 1 DM and a smoker - get stat EKG, asa PR 300 mg. Due to hx of RPH, low platelet, would not give heparin gtt yet. NPO status. If 3 rd troponin going up or d dimer stat highly elevated would consider heparin ( benefit outweighs risk ). - 2D echo - Cardiology consultation in AM - get drug screening, hcg if not done. Making urine.   2. DKA - switch fluids to d5 half NS. Follow BMP.    eICU Interventions  Discussed with RN.      Intervention Category Intermediate Interventions: Diagnostic test evaluation;Hyperglycemia - evaluation and treatment  Ranee Gosselin 07/03/2021, 10:12 PM

## 2021-07-03 NOTE — Progress Notes (Signed)
MD notified of pt's troponin level results, new order obtained for EKG.

## 2021-07-03 NOTE — ED Notes (Addendum)
Arterial blood gas, pH 6.894, CO2 10.7, Knapp MD notified via phone of results.

## 2021-07-03 NOTE — ED Provider Notes (Signed)
[facility]-EMERGENCY DEPT Provider Note   CSN: [id number] Arrival date & time: 07/03/21  0865     History Chief Complaint  Patient presents with   Hyperglycemia   Altered Mental Status    Madison Williams is a 35 y.o. female.   Hyperglycemia Associated symptoms: altered mental status   Altered Mental Status  Patient presents to the ED for evaluation of altered mental status.  According to EMS report patient has been having some abdominal discomfort this past week.  However EMS was called today because the mother found the patient altered and hyperglycemic.  She does have a history of diabetes and DKA.   Patient's blood sugar was reading as high.  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   SOB (shortness of breath)    Hypokalemia    Macrocytosis without anemia    Retroperitoneal hematoma    DKA (diabetic ketoacidosis) (HCC) 11/24/2019   Acute ST elevation myocardial infarction (STEMI) of anterolateral wall (HCC)    Abnormal EKG    Elevated troponin    Nausea & vomiting 10/16/2019   Abdominal pain 10/16/2019   Chest tightness    Mild intermittent asthma without complication    Acute vomiting 01/31/2019   Type 1 diabetes mellitus with hyperglycemia (HCC) 01/31/2019   Uninsured 01/31/2019   DKA (diabetic ketoacidoses) 03/30/2018   AKI (acute kidney injury) (HCC) 03/30/2018   Leukocytosis 03/30/2018   Dehydration 03/30/2018   Tobacco use disorder 03/30/2018    Past Surgical History:  Procedure Laterality Date   LAPAROSCOPIC OVARIAN     LEFT HEART CATH AND CORONARY ANGIOGRAPHY N/A 11/24/2019   Procedure: LEFT HEART CATH AND CORONARY ANGIOGRAPHY;  Surgeon: Kathleene Hazel,  MD;  Location: [facility];  Service: Cardiovascular;  Laterality: N/A;     OB History   No obstetric history on file.     History reviewed. No pertinent family history.  Social History   Tobacco Use   Smoking status: Former    Packs/day: 1.00    Types: Cigarettes   Smokeless tobacco: Never  Vaping Use   Vaping Use: Every day   Substances: Nicotine  Substance Use Topics   Alcohol use: Not Currently   Drug use: Yes    Comment: [employer]    Home Medications Prior to Admission medications   Medication Sig Start Date End Date Taking? Authorizing Provider  benzonatate (TESSALON) 100 MG capsule Take 1 capsule (100 mg total) by mouth 2 (two) times daily as needed for cough. 04/29/21   Claiborne Rigg, NP  chlordiazePOXIDE (LIBRIUM) 10 MG capsule Take 10 mg by mouth 2 (two) times daily as needed for anxiety. 12/29/20   [provider]  FLUoxetine (PROZAC) 20 MG capsule Take 60 mg by mouth daily. 12/29/20   [provider]  gabapentin (NEURONTIN) 300 MG capsule Take 300 mg by mouth 3 (three) times daily as needed. Neuropathy 12/29/20   [provider]  HUMALOG 100 UNIT/ML injection Inject 0-15 Units into the skin 3 (three) times daily with meals. Sliding scale. 06/08/20   [provider]  ibuprofen (ADVIL) 200 MG tablet Take 200 mg by mouth every 6 (six) hours as  needed for moderate pain.    [provider]  insulin glargine (LANTUS) 100 UNIT/ML injection Inject 0.25 mLs (25 Units total) into the skin daily. 03/14/21 06/27/21  Hoy Register, MD  insulin lispro (HUMALOG) 100 UNIT/ML injection Inject 0-9 units into the skin three times daily with meals by sliding scale 03/07/21   Hoy Register, MD  Insulin Syringes, Disposable, U-100 0.5 ML MISC 1 each by Does not apply route 4 (four) times daily - after meals and at bedtime. 02/05/20   Anders Simmonds, PA-C  metoCLOPramide (REGLAN) 5 MG tablet TAKE 1 TABLET (5 MG TOTAL) BY MOUTH 3 (THREE) TIMES  DAILY BEFORE MEALS. Patient not taking: No sig reported 07/07/20 07/07/21  Hoy Register, MD  promethazine (PHENERGAN) 25 MG tablet Take 1 tablet (25 mg total) by mouth every 6 (six) hours as needed for nausea or vomiting. 01/18/21   Tyrone Nine, MD    Allergies    Patient has no known allergies.  Review of Systems   Review of Systems  Unable to perform ROS: Mental status change   Physical Exam Updated Vital Signs BP (!) 158/139   Pulse (!) 106   Resp (!) 32   LMP 03/16/2018 Comment: negative urine preg test 01/14/20  SpO2 100%   Physical Exam Vitals and nursing note reviewed.  Constitutional:      Appearance: She is well-developed. She is ill-appearing. She is not toxic-appearing.  HENT:     Head: Normocephalic and atraumatic.     Right Ear: External ear normal.     Left Ear: External ear normal.     Mouth/Throat:     Mouth: Mucous membranes are dry.  Eyes:     General: No scleral icterus.       Right eye: No discharge.        Left eye: No discharge.     Conjunctiva/sclera: Conjunctivae normal.  Neck:     Trachea: No tracheal deviation.  Cardiovascular:     Rate and Rhythm: Normal rate and regular rhythm.  Pulmonary:     Effort: Pulmonary effort is normal. No respiratory distress.     Breath sounds: Normal breath sounds. No stridor. No wheezing or rales.  Abdominal:     General: Bowel sounds are normal. There is no distension.     Palpations: Abdomen is soft.     Tenderness: There is no abdominal tenderness. There is no guarding or rebound.  Musculoskeletal:        General: No tenderness or deformity.     Cervical back: Neck supple.  Skin:    General: Skin is warm and dry.     Findings: No rash.  Neurological:     General: No focal deficit present.     Mental Status: She is unresponsive.     GCS: GCS eye subscore is 2. GCS verbal subscore is 1. GCS motor subscore is 5.     Motor: No abnormal muscle tone or seizure activity.     Comments: Patient not following  commands, minimally responsive  Psychiatric:        Mood and Affect: Mood normal.    ED Results / Procedures / Treatments   Labs (all labs ordered are listed, but only abnormal results are displayed) Labs Reviewed  BETA-HYDROXYBUTYRIC ACID - Abnormal; Notable for the following components:      Result Value   Beta-Hydroxybutyric Acid >8.00 (*)    All other components within normal limits  CBC WITH DIFFERENTIAL/PLATELET - Abnormal; Notable for  the following components:   WBC 24.3 (*)    RBC 3.84 (*)    Hemoglobin 11.8 (*)    MCV 112.5 (*)    MCHC 27.3 (*)    Neutro Abs 17.4 (*)    Lymphs Abs 4.1 (*)    Monocytes Absolute 1.7 (*)    Abs Immature Granulocytes 1.02 (*)    All other components within normal limits  URINALYSIS, ROUTINE W REFLEX MICROSCOPIC - Abnormal; Notable for the following components:   APPearance CLOUDY (*)    Glucose, UA >=500 (*)    Hgb urine dipstick SMALL (*)    Ketones, ur 20 (*)    Protein, ur 30 (*)    Leukocytes,Ua TRACE (*)    Bacteria, UA RARE (*)    All other components within normal limits  COMPREHENSIVE METABOLIC PANEL - Abnormal; Notable for the following components:   Sodium 123 (*)    Potassium 6.9 (*)    Chloride 80 (*)    CO2 <7 (*)    Glucose, Bld 1,498 (*)    BUN 59 (*)    Creatinine, Ser 4.70 (*)    Calcium 7.1 (*)    Alkaline Phosphatase 137 (*)    Total Bilirubin 2.5 (*)    GFR, Estimated 12 (*)    All other components within normal limits  LACTIC ACID, PLASMA - Abnormal; Notable for the following components:   Lactic Acid, Venous 3.2 (*)    All other components within normal limits  BLOOD GAS, ARTERIAL - Abnormal; Notable for the following components:   pH, Arterial 6.894 (*)    pCO2 arterial 10.7 (*)    pO2, Arterial 165 (*)    Bicarbonate 2.0 (*)    All other components within normal limits  CBG MONITORING, ED - Abnormal; Notable for the following components:   Glucose-Capillary >600 (*)    All other components within  normal limits  CBG MONITORING, ED - Abnormal; Notable for the following components:   Glucose-Capillary >600 (*)    All other components within normal limits  I-STAT CHEM 8, ED - Abnormal; Notable for the following components:   Sodium 124 (*)    Potassium 6.7 (*)    Chloride 95 (*)    BUN 51 (*)    Creatinine, Ser 4.20 (*)    Glucose, Bld >700 (*)    Calcium, Ion 0.90 (*)    TCO2 6 (*)    All other components within normal limits  CBG MONITORING, ED - Abnormal; Notable for the following components:   Glucose-Capillary >600 (*)    All other components within normal limits  RESP PANEL BY RT-PCR (FLU A&B, COVID) ARPGX2  CULTURE, BLOOD (ROUTINE X 2)  CULTURE, BLOOD (ROUTINE X 2)  URINE CULTURE  LIPASE, BLOOD  HCG, QUANTITATIVE, PREGNANCY  BETA-HYDROXYBUTYRIC ACID  LACTIC ACID, PLASMA  BASIC METABOLIC PANEL  I-STAT BETA HCG BLOOD, ED ([facility], [facility], [facility] ONLY)    EKG EKG Interpretation  Date/Time:  Sunday July 03 2021 10:11:23 EST Ventricular Rate:  85 PR Interval:  199 QRS Duration: 132 QT Interval:  455 QTC Calculation: 542 R Axis:   95 Text Interpretation: Sinus rhythm Biatrial enlargement Nonspecific intraventricular conduction delay , new since last tracing Probable anteroseptal infarct, recent t wave amplitude increased since last tracing Confirmed by Linwood Dibbles [phone]) on 07/03/2021 10:35:37 AM  Radiology DG Chest Portable 1 View  Result Date: 07/03/2021 CLINICAL DATA:  Altered mental status. EXAM: PORTABLE CHEST 1 VIEW COMPARISON:  06/19/2021 FINDINGS:  1132 hours. Rightward patient rotation. The lungs are clear without focal pneumonia, edema, pneumothorax or pleural effusion. The cardiopericardial silhouette is within normal limits for size. The visualized bony structures of the thorax show no acute abnormality. Telemetry leads overlie the chest. IMPRESSION: No active disease. Electronically Signed   By: Kennith Center M.D.   On: 07/03/2021 12:11    Procedures .Critical  Care Performed by: Linwood Dibbles, MD Authorized by: Linwood Dibbles, MD   Critical care provider statement:    Critical care time (minutes):  60   Critical care was time spent personally by me on the following activities:  Development of treatment plan with patient or surrogate, discussions with consultants, evaluation of patient's response to treatment, examination of patient, ordering and review of laboratory studies, ordering and review of radiographic studies, ordering and performing treatments and interventions, pulse oximetry, re-evaluation of patient's condition and review of old charts   Medications Ordered in ED Medications  insulin regular, human (MYXREDLIN) 100 units/ 100 mL infusion (4.6 Units/hr Intravenous New Bag/Given 07/03/21 1108)  dextrose 5 % in lactated ringers infusion (has no administration in time range)  dextrose 50 % solution 0-50 mL (has no administration in time range)  lactated ringers bolus 1,100 mL (0 mLs Intravenous Stopped 07/03/21 1153)  calcium gluconate 1 g/ 50 mL sodium chloride IVPB (0 mg Intravenous Stopped 07/03/21 1153)  cefTRIAXone (ROCEPHIN) 2 g in sodium chloride 0.9 % 100 mL IVPB (0 g Intravenous Stopped 07/03/21 1152)  lactated ringers bolus 2,000 mL (0 mLs Intravenous Stopped 07/03/21 1309)    ED Course  I have reviewed the triage vital signs and the nursing notes.  Pertinent labs & imaging results that were available during my care of the patient were reviewed by me and considered in my medical decision making (see chart for details).  Clinical Course as of 07/03/21 1333  Sun Jul 03, 2021  1055 Initial i-STAT shows sodium of 124, potassium of 6.7 and a glucose greater than 700 [JK]  1055 With the hyperkalemia we will start insulin [JK]  1056 I-STAT also showed BUN/creatinine of 51 and 4.2 with a bicarb down to 6 [JK]  1100 Attempted to call mother.  No response [JK]  1109 Blood pressure remains low.  We will see if there is some improvement with IV  fluid resuscitation.  Patient may end up requiring pressors [JK]  1109 Hypotension likely related to the DKA but will cover with a dose of antibiotics [JK]  1110 Metabolic panel shows acute renal failure compared to previous values [JK]  1127 D/w Dr Marchelle Gearing.  Would recommend at least 4-5 liters of fluid.  Will assess patient [JK]  1154 Pressures improving.  Now 119/89.  Mental status is still diminished.  We will continue to monitor closely [JK]  1221 ABG shows signs of severe metabolic acidosis.  Blood pressure seems to be improving.  Suspect potassium will also be decreasing with her insulin infusion hydration.  Will recheck.  Hold off on bicarb at this time [JK]  1329 Patient continues to improve.  Mental status is clear now.  She is starting to communicate.  Patient was seen in the ED by Dr. Marchelle Gearing.  Feels the patient is more appropriate for hospitalist service and stepdown at this time [JK]    Clinical Course User Index [JK] Linwood Dibbles, MD   MDM Rules/Calculators/A&P  Patient presented to the ED for evaluation of altered mental status.  Patient has history of diabetes and previous DKA.  Patient was minimally responsive on arrival and had significant elevated blood sugar.  Presumptive treatment for DKA initiated.  Laboratory tests did confirm the suspicion of DKA.  Patient has an anion gap metabolic acidosis with severe hyperglycemia.  Patient also has evidence of acute renal failure.  Patient was initially hypotensive and was started on IV fluid resuscitation.  Blood pressure has improved.  COVID and flu are negative.  Urinalysis does not suggest infection.  Chest x-ray without pneumonia.  Patient does have elevated white blood cell count and lactic acidosis, covered with empiric antibiotics although I suspect this is related to her DKA and not evolving sepsis.  Patient is slowly improving with IV fluid resuscitation and insulin infusion.  Case was discussed with  critical care service.  Patient was evaluated by Dr Marchelle Gearing.  As point she seems to be improving and can be admitted by the hospitalist service. Final Clinical Impression(s) / ED Diagnoses Final diagnoses:  Diabetic ketoacidosis with coma associated with type 1 diabetes mellitus (HCC)  Hypovolemic shock (HCC)  Acute renal failure, unspecified acute renal failure type [facility])     Linwood Dibbles, MD 07/03/21 [phone]

## 2021-07-03 NOTE — H&P (Addendum)
History and Physical    [name] ZOX:[id number] DOB: [birth year] DOA: 07/03/2021  PCP: Charlott Rakes, MD   Patient coming from: Home.   I have personally briefly reviewed patient's old medical records in [place]  Chief Complaint: AMS and hyperglycemia.  HPI: Madison Williams is a 35 y.o. female with medical history significant of asthma, type II DM, rheumatoid arthritis, COVID-19 in September who is coming to the emergency department due to her mother calling EMS secondary to altered mental status and hyperglycemia since earlier today.  Mother stated she has been having abdominal pain for a week.  The patient was initially very obtunded, but is able to answer simple questions now.  Denied headache, chest pain, back or abdominal pain.  She stated she has been using her insulin.  No fever or chills but multiple episodes of emesis and several episodes of diarrhea.  She has been urinating very little for the past 2 days.  ED Course: Initial vital signs were temperature 96.4 F, pulse 107, respirations 40, BP 72/47 mmHg O2 sat 90% on room air.  However, when I evaluated the patient she was 100% on room air.  The patient received over 3000 mL of LR IVF bolus and was started on an insulin infusion.  She also received ceftriaxone 2 g IVPB and calcium gluconate 1 g.  I added another 2000 mL of LR bolus.  Lab work: Her urinalysis was cloudy with glucosuria more than 500, ketonuria and 20 and proteinuria 30 mg/dL.  There was small hemoglobinuria and trace leukocyte esterase with rare bacteria seen on microscopic examination.  CBC is her white count of 24.3 with 72% neutrophils, hemoglobin 11.8 g/dL and platelets 304.  Sodium 123, potassium 6.9, chloride 87 CO2 less than 7 mmol/L.  Her anion gap was not calculated.  Calcium 7.1, glucose was 1498, BUN 59 and creatinine 4.70 mg/dL.  Her total bilirubin was 2.5 mg/dL and alkaline phosphatase 137 units/L.  The rest of the LFTs are  unremarkable.  Arterial blood gas showed pH of 6.89, PCO2 of 10.7 and PO2 of 165 mmHg.  Imaging: A portable chest radiograph did not show any active disease.  Please see image and full radiology report for further details.  Review of Systems: As per HPI otherwise all other systems reviewed and are negative.  Past Medical History:  Diagnosis Date   Asthma    Diabetes mellitus without complication ([place])    Rheumatoid arthritis ([place])    Past Surgical History:  Procedure Laterality Date   LAPAROSCOPIC OVARIAN     LEFT HEART CATH AND CORONARY ANGIOGRAPHY N/A 11/24/2019   Procedure: LEFT HEART CATH AND CORONARY ANGIOGRAPHY;  Surgeon: Burnell Blanks, MD;  Location: [facility];  Service: Cardiovascular;  Laterality: N/A;   Social History  reports that she has quit smoking. Her smoking use included cigarettes. She smoked an average of 1 pack per day. She has never used smokeless tobacco. She reports that she does not currently use alcohol. She reports current drug use.  No Known Allergies  Family History  Family history unknown: Yes   Prior to Admission medications   Medication Sig Start Date End Date Taking? Authorizing Provider  clonazePAM (KLONOPIN) 1 MG tablet Take 1 mg by mouth 2 (two) times daily as needed for anxiety. 06/14/21  Yes [provider]  FLUoxetine (PROZAC) 20 MG capsule Take 20 mg by mouth 2 (two) times daily. 12/29/20  Yes [provider]  gabapentin (NEURONTIN) 300  MG capsule Take 300-600 mg by mouth 2 (two) times daily as needed (pain). 12/29/20  Yes [provider]  insulin lispro (HUMALOG) 100 UNIT/ML injection Inject 0-9 units into the skin three times daily with meals by sliding scale 03/07/21  Yes Newlin, Charlane Ferretti, MD  lamoTRIgine (LAMICTAL) 25 MG tablet Take 50 mg by mouth at bedtime. 06/13/21  Yes [provider]  metoprolol succinate (TOPROL-XL) 25 MG 24 hr tablet Take 25 mg by mouth daily. 06/14/21  Yes [provider]  promethazine (PHENERGAN) 25 MG tablet Take 1 tablet (25 mg total) by mouth every 6 (six) hours as needed for nausea or vomiting. 01/18/21  Yes Patrecia Pour, MD  benzonatate (TESSALON) 100 MG capsule Take 1 capsule (100 mg total) by mouth 2 (two) times daily as needed for cough. Patient not taking: Reported on 07/03/2021 04/29/21   Gildardo Pounds, NP  ibuprofen (ADVIL) 200 MG tablet Take 200 mg by mouth every 6 (six) hours as needed for moderate pain.    [provider]  insulin glargine (LANTUS) 100 UNIT/ML injection Inject 0.25 mLs (25 Units total) into the skin daily. 03/14/21 06/27/21  Charlott Rakes, MD  Insulin Syringes, Disposable, U-100 0.5 ML MISC 1 each by Does not apply route 4 (four) times daily - after meals and at bedtime. 02/05/20   Argentina Donovan, PA-C  metoCLOPramide (REGLAN) 5 MG tablet TAKE 1 TABLET (5 MG TOTAL) BY MOUTH 3 (THREE) TIMES DAILY BEFORE MEALS. Patient not taking: Reported on 03/14/2021 07/07/20 07/07/21  Charlott Rakes, MD   Physical Exam: Vitals:   07/03/21 1415 07/03/21 1430 07/03/21 1445 07/03/21 1500  BP: (!) 76/60 (!) 82/44 (!) 81/57 (!) 74/52  Pulse: (!) 112 (!) 111 (!) 110 (!) 109  Resp: (!) 22 (!) 31 (!) 30 (!) 34  SpO2: 100% 100% 100% 100%   Constitutional: Looks acutely ill. Eyes: PERRL, lids and conjunctivae normal.  Injected sclera bilaterally. ENMT: Mucous membranes are very dry.  Lips are dry and chapped.  Posterior pharynx clear of any exudate or lesions. Neck: normal, supple, no masses, no thyromegaly Respiratory: Tachypneic in the high 20s and low 30s with Kussmaul pattern, no wheezing, no crackles. Normal respiratory effort. No accessory muscle use.  Cardiovascular: Tachycardic with a regular rhythm in the 120s, no murmurs / rubs / gallops. No extremity edema. 2+ pedal pulses. No carotid bruits.  Abdomen: No distention.  Bowel sounds positive.  Soft, positive epigastric tenderness, no guarding or rebound, masses  palpated. No hepatosplenomegaly. Bowel sounds positive.  Musculoskeletal: Severe generalized weakness.  No clubbing / cyanosis. Good ROM, no contractures. Normal muscle tone.  Skin: no rashes, lesions, ulcers on very limited dermatological examination. Neurologic: CN 2-12 grossly intact. Sensation intact, DTR normal. Strength 5/5 in all 4.  Psychiatric: Somnolent.  Answer simple questions.  Oriented x2, partially oriented to time/date and situation.  Labs on Admission: I have personally reviewed following labs and imaging studies  CBC: Recent Labs  Lab 07/03/21 1025 07/03/21 1050  WBC 24.3*  --   NEUTROABS 17.4*  --   HGB 11.8* 13.9  HCT 43.2 41.0  MCV 112.5*  --   PLT 304  --    Basic Metabolic Panel: Recent Labs  Lab 07/03/21 1026 07/03/21 1050 07/03/21 1400  NA 123* 124* 134*  K 6.9* 6.7* 4.2  CL 80* 95* 95*  CO2 <7*  --  <7*  GLUCOSE 1,498* >700* 1,016*  BUN 59* 51* 61*  CREATININE 4.70* 4.20* 4.19*  CALCIUM 7.1*  --  7.0*   GFR: CrCl cannot be calculated (Unknown ideal weight.).  Liver Function Tests: Recent Labs  Lab 07/03/21 1026  AST 28  ALT 40  ALKPHOS 137*  BILITOT 2.5*  PROT 6.8  ALBUMIN 4.0   Urine analysis:    Component Value Date/Time   COLORURINE YELLOW 07/03/2021 1157   APPEARANCEUR CLOUDY (A) 07/03/2021 1157   LABSPEC 1.015 07/03/2021 1157   PHURINE 5.0 07/03/2021 1157   GLUCOSEU >=500 (A) 07/03/2021 1157   HGBUR SMALL (A) 07/03/2021 1157   BILIRUBINUR NEGATIVE 07/03/2021 1157   KETONESUR 20 (A) 07/03/2021 1157   PROTEINUR 30 (A) 07/03/2021 1157   NITRITE NEGATIVE 07/03/2021 1157   LEUKOCYTESUR TRACE (A) 07/03/2021 1157   Radiological Exams on Admission: DG Chest Portable 1 View  Result Date: 07/03/2021 CLINICAL DATA:  Altered mental status. EXAM: PORTABLE CHEST 1 VIEW COMPARISON:  06/19/2021 FINDINGS: 1132 hours. Rightward patient rotation. The lungs are clear without focal pneumonia, edema, pneumothorax or pleural effusion. The  cardiopericardial silhouette is within normal limits for size. The visualized bony structures of the thorax show no acute abnormality. Telemetry leads overlie the chest. IMPRESSION: No active disease. Electronically Signed   By: Misty Stanley M.D.   On: 07/03/2021 12:11    Echocardiogram 11/25/2019  IMPRESSIONS:   1. Definity used; inferolateral and apical hypokinesis with overall mild  LV dysfunction; grade 1 diastolic dysfunction; moderate AI.   2. Left ventricular ejection fraction, by estimation, is 45 to 50%. The  left ventricle has mildly decreased function. The left ventricle  demonstrates regional wall motion abnormalities (see scoring  diagram/findings for description). Left ventricular  diastolic parameters are consistent with Grade I diastolic dysfunction  (impaired relaxation).   3. Right ventricular systolic function is normal. The right ventricular  size is normal.   4. The mitral valve is normal in structure. Trivial mitral valve  regurgitation. No evidence of mitral stenosis.   5. The aortic valve is tricuspid. Aortic valve regurgitation is moderate.  No aortic stenosis is present.   6. The inferior vena cava is dilated in size with >50% respiratory  variability, suggesting right atrial pressure of 8 mmHg.   EKG: Independently reviewed.  Vent. rate 85 BPM PR interval 199 ms QRS duration 132 ms QT/QTcB 455/542 ms P-R-T axes 70 95 53 Sinus rhythm Biatrial enlargement Nonspecific intraventricular conduction delay , new since last tracing Probable anteroseptal infarct, recent  Assessment/Plan Principal Problem:   DKA (diabetic ketoacidosis) ([place]) Due to uncontrolled   Type 1 diabetes mellitus with hyperglycemia ([place]) Observation/stepdown. Continue IV fluids. Keep NPO. Continue insulin infusion per Endo tool. Transition to SQ insulin per Endo tool. Monitor and correct electrolytes as needed. Dr. Chase Caller consult appreciated.  Active Problems:   AKI (acute  kidney injury) ([place]) Continue vigorous rehydration. Avoid hypotension. Avoid nephrotoxins. Monitor intake and output. Follow-up renal function electrolytes in AM.    SIRS (systemic inflammatory response syndrome) ([place])   Vs sepsis due to undetermined organism Sepsis criteria met but these findings could also be DKA. Begin broad-spectrum IV antibiotic therapy pending procalcitonin level. Continue IV fluids and follow-up blood culture and sensitivity.    Elevated troponin Likely demand ischemia. Trend troponin level. Follow-up EKG.    Hyperkalemia Resolved. Follow-up potassium level.    Hyperphosphatemia Continue IV fluids. Check level in the morning.    Hypermagnesemia Continue IV fluids Follow-up magnesium level.   Mild intermittent asthma without complication Supplemental oxygen as needed. Bronchodilators as needed.  Rheumatoid arthritis ([place]) Currently not on therapy. Rheumatoid factor and anti-CCP pending.   DVT prophylaxis: Lovenox SQ. Code Status:   Full code. Family Communication:   Disposition Plan:   Patient is from:  Home.  Anticipated DC to:  Home.  Anticipated DC date:  07/05/2021 or 07/06/2021.  Anticipated DC barriers: Clinical status.  Consults called:   Admission status:  Stepdown/observation.  High severity after presenting with altered mental status in the setting of acute metabolic encephalopathy in the setting of DKA and.  SIRS versus sepsis due to undetermined organism.  Severity of Illness:  Reubin Milan MD [facility]  How to contact the [facility] Attending or Consulting provider [place] or covering provider during after hours [place], for this patient?   Check the care team in [facility] and look for a) attending/consulting [facility] provider listed and b) the [facility] team listed Log into [email or web address] and use [place]'s universal password to access. If you do not have the password, please contact the hospital operator. Locate the [facility]  provider you are looking for under [facility] and page to a number that you can be directly reached. If you still have difficulty reaching the provider, please page the [facility] (Director on Call) for the Hospitalists listed on amion for assistance.  07/03/2021, 3:17 PM   This document was prepared using Dragon voice recognition software and may contain some unintended transcription errors.

## 2021-07-03 NOTE — Consult Note (Addendum)
NAME:  Madison Williams, MRN:  [id number], DOB:  1985/09/25, LOS: 0 ADMISSION DATE:  07/03/2021, CONSULTATION DATE:  07/03/2021  REFERRING MD:  DR Linwood Dibbles of [facility], CHIEF COMPLAINT:  severe DKA    History of Present Illness:   35 year old thin cachectic female with type 1 diabetes, gastroparesis, hypertension, hyperlipidemia, history of previous cigarette smoking and vaping admissions for DKA June 2022 following running out of insulin, and multiple other admissions including June 2021, May 2021 [course complicated by retroperitoneal hematoma, thrombocytopenia and?  Myocardial infarction], March 2021 [course complicated by AKI] and 2 other admissions in 2020  She had COVID-19 04/29/2021.  Given outpatient molnuparavir.  On 07/01/2021 had called the outpatient clinic with right-sided chest pain last seen normal 07/02/2021.  Unclear called the EMS today upon EMS arrival found to be in obtunded encephalopathy hypoglycemic and also hypotensive to systolic of 76.  Also hyperglycemic.  Severe DKA and dehydration as documented below.  Critical care medicine called.  By the time of critical care medicine evaluation her hypotension resolved.  She was more awake and responding and answering questions appropriately but having significant Kussmaul respiration and feeling fatigued.   Features of severe DKA and dehydration  - pH 6.89 -Obtunded encephalopathy at admission-> improving at the time of CCM evaluation -Acute renal failure with a creatinine of 4.2 and a BUN of 51 -Glucose of greater than 700 - Potassium of 6.7  -Bicarb less than 7 -Anion gap of 22 [Assumption of sodium 124] -Hypotensive subsequently resolved with fluids  Past Medical History:    has a past medical history of Asthma, Diabetes mellitus without complication (HCC), and Rheumatoid arthritis (HCC).   reports that she has quit smoking. Her smoking use included cigarettes. She smoked an average of 1 pack per day. She has never  used smokeless tobacco.  Past Surgical History:  Procedure Laterality Date   LAPAROSCOPIC OVARIAN     LEFT HEART CATH AND CORONARY ANGIOGRAPHY N/A 11/24/2019   Procedure: LEFT HEART CATH AND CORONARY ANGIOGRAPHY;  Surgeon: Kathleene Hazel, MD;  Location: [facility];  Service: Cardiovascular;  Laterality: N/A;    No Known Allergies  Immunization History  Administered Date(s) Administered   Moderna Sars-Covid-2 Vaccination 01/28/2020, 03/26/2020    History reviewed. No pertinent family history.   Current Facility-Administered Medications:    dextrose 5 % in lactated ringers infusion, , Intravenous, Continuous, Linwood Dibbles, MD   dextrose 50 % solution 0-50 mL, 0-50 mL, Intravenous, PRN, Linwood Dibbles, MD   insulin regular, human (MYXREDLIN) 100 units/ 100 mL infusion, , Intravenous, Continuous, Linwood Dibbles, MD, Last Rate: 4.6 mL/hr at 07/03/21 1108, 4.6 Units/hr at 07/03/21 1108  Current Outpatient Medications:    benzonatate (TESSALON) 100 MG capsule, Take 1 capsule (100 mg total) by mouth 2 (two) times daily as needed for cough., Disp: 20 capsule, Rfl: 0   chlordiazePOXIDE (LIBRIUM) 10 MG capsule, Take 10 mg by mouth 2 (two) times daily as needed for anxiety., Disp: , Rfl:    FLUoxetine (PROZAC) 20 MG capsule, Take 60 mg by mouth daily., Disp: , Rfl:    gabapentin (NEURONTIN) 300 MG capsule, Take 300 mg by mouth 3 (three) times daily as needed. Neuropathy, Disp: , Rfl:    HUMALOG 100 UNIT/ML injection, Inject 0-15 Units into the skin 3 (three) times daily with meals. Sliding scale., Disp: , Rfl:    ibuprofen (ADVIL) 200 MG tablet, Take 200 mg by mouth every 6 (six) hours as needed for  moderate pain., Disp: , Rfl:    insulin glargine (LANTUS) 100 UNIT/ML injection, Inject 0.25 mLs (25 Units total) into the skin daily., Disp: 30 mL, Rfl: 3   insulin lispro (HUMALOG) 100 UNIT/ML injection, Inject 0-9 units into the skin three times daily with meals by sliding scale, Disp: 10 mL,  Rfl: 1   Insulin Syringes, Disposable, U-100 0.5 ML MISC, 1 each by Does not apply route 4 (four) times daily - after meals and at bedtime., Disp: 100 each, Rfl: 5   metoCLOPramide (REGLAN) 5 MG tablet, TAKE 1 TABLET (5 MG TOTAL) BY MOUTH 3 (THREE) TIMES DAILY BEFORE MEALS. (Patient not taking: No sig reported), Disp: 90 tablet, Rfl: 3   promethazine (PHENERGAN) 25 MG tablet, Take 1 tablet (25 mg total) by mouth every 6 (six) hours as needed for nausea or vomiting., Disp: 30 tablet, Rfl: 0     Significant Hospital Events:  07/03/2021 - admit   Interim History / Subjective:   07/03/2021 - seen in [facility] emergency department bed [facility]  Objective   Blood pressure 90/65, pulse (!) 109, resp. rate 20, last menstrual period 03/16/2018, SpO2 99 %.       No intake or output data in the 24 hours ending 07/03/21 1411 There were no vitals filed for this visit.  Examination: Frail thin female with significant diffuse muscular wasting.  She is mostly sleepy but arousable and mutters answers that seem appropriate.  Able to follow commands briefly and then falls off to sleep.  Significant tachypnea but not paradoxical.  Has diapers on clear to auscultation bilaterally tachycardic abdomen soft no rash no joint deformity.  No joint swelling.  Resolved Hospital Problem list   x  Assessment & Plan:  A  A:  Severe respiratory distress secondary to severe metabolic acidosis due to DKA and renal failure Kussmaul respiration -pulse ox 100% on room air.  Acidosis appears compensated   P:   Monitor closely for signs of tiredness  > check ABG if any concern such as paradoxical respiration or tiring   A:   Acute encephalopathy /COMA-at admission secondary to severe DKA.  Improving since arrival to the ER  P:   Monitor with DKA treatment     A:   Circulatory shock -present on admission secondary to DKA and dehydration    -Resolved with fluid hydration in the ED  P:  Aggressive fluid  therapy with DKA management    A: History of myocardial infarction otherwise specified in 2021 Chronic left ventricular systolic dysfunction with grade 1 diastolic dysfunction on echo -April 2021  P: Check cardiac enzymes Check troponin Get ekg   A: Sinus tachycardia   P: Monitor  INFECTIOUS A:   At risk for associated viral or bacterial infection precipitating her into DKA -at admission  P:   Check urine strep Check urine Legionella Check procalcitonin Check respiratory virus panel Check HIV   A:  Acute renal failure due to dehydration due to DKA -present at admission [previous history of similar with DKA present post bracket   P:  Monitor with hydration   A:  Hyperkalemia at admission secondary to DKA -given calcium correction by the ED  P: Monitor In particular monitor for hypophosphatemia    A:   History of gastroparesis due to diabetes - Prior to & Present on Admit   P:   Might need Reglan   A:  Mild anemia at admission   P:  - PRBC for hgb </= 6.9gm%    -  exceptions are   -  if ACS susepcted/confirmed then transfuse for hgb </= 8.0gm%,  or    -  active bleeding with hemodynamic instability, then transfuse regardless of hemoglobin value   At at all times try to transfuse 1 unit prbc as possible with exception of active hemorrhage    A Normal platelets at admission but at risk for thrombocytopenia  P Monitor with DVT prophylaxis   A:   Severe DKA in the setting of type 1 diabetes  P:   DKA protocol with aggressive volume hydration Need to sort out if she has diabetes related complications prior to admission -if so would be present on admission  MSK/DERM Hx of RA NOS Appears to have diapers at admission  Plan  - Monitor - check urine preg - check UDS - might need purewick - check RF and CCP    Best practice (daily eval):  Diet: N.p.o. Pain/Anxiety/Delirium protocol (if indicated): Not applicable VAP protocol (if  indicated): Not applicable DVT prophylaxis: Lovenox GI prophylaxis: Likely needs PPI Glucose control: DKA protocol Mobility: Bedrest Code Status: Full code Family Communication: Currently by EDP later by [facility] Disposition: Moved from ED to stepdown [facility] -[facility] hospitalist primary     ATTESTATION & SIGNATURE   The patient Madison Williams is critically ill with multiple organ systems failure and requires high complexity decision making for assessment and support, frequent evaluation and titration of therapies, application of advanced monitoring technologies and extensive interpretation of multiple databases.   Critical Care Time devoted to patient care services described in this note is  60  Minutes. This time reflects time of care of this signee Dr Kalman Shan. This critical care time does not reflect procedure time, or teaching time or supervisory time of PA/NP/Med student/Med Resident etc but could involve care discussion time      SIGNATURE    Dr. Kalman Shan, M.D., F.C.C.P,  Pulmonary and Critical Care Medicine Staff Physician, [facility] Director - Interstitial Lung Disease  Program  Pulmonary Fibrosis [facility] at [place], [place], [address]  NPI Number:  NPI #[id number]  Pager: [phone], If no answer  -> Check AMION or Try [phone] Telephone (clinical office): [phone] Telephone (research): [phone]  2:11 PM 07/03/2021   07/03/2021 2:11 PM    LABS    PULMONARY Recent Labs  Lab 07/03/21 1050 07/03/21 1120  PHART  --  6.894*  PCO2ART  --  10.7*  PO2ART  --  165*  HCO3  --  2.0*  TCO2 6*  --   O2SAT  --  96.4    CBC Recent Labs  Lab 07/03/21 1025 07/03/21 1050  HGB 11.8* 13.9  HCT 43.2 41.0  WBC 24.3*  --   PLT 304  --     COAGULATION No results for input(s): INR in the last 168 hours.  CARDIAC  No results for input(s): TROPONINI in the last 168  hours. No results for input(s): PROBNP in the last 168 hours.   CHEMISTRY Recent Labs  Lab 07/03/21 1026 07/03/21 1050  NA 123* 124*  K 6.9* 6.7*  CL 80* 95*  CO2 <7*  --   GLUCOSE 1,498* >700*  BUN 59* 51*  CREATININE 4.70* 4.20*  CALCIUM 7.1*  --    CrCl cannot be calculated (Unknown ideal weight.).   LIVER Recent Labs  Lab 07/03/21 1026  AST 28  ALT 40  ALKPHOS 137*  BILITOT 2.5*  PROT 6.8  ALBUMIN 4.0     INFECTIOUS Recent Labs  Lab 07/03/21 1045 07/03/21 1251  LATICACIDVEN 3.2* 2.1*     ENDOCRINE CBG (last 3)  Recent Labs    07/03/21 1138 07/03/21 1221 07/03/21 1334  GLUCAP >600* >600* >600*         IMAGING x48h  - image(s) personally visualized  -   highlighted in bold DG Chest Portable 1 View  Result Date: 07/03/2021 CLINICAL DATA:  Altered mental status. EXAM: PORTABLE CHEST 1 VIEW COMPARISON:  06/19/2021 FINDINGS: 1132 hours. Rightward patient rotation. The lungs are clear without focal pneumonia, edema, pneumothorax or pleural effusion. The cardiopericardial silhouette is within normal limits for size. The visualized bony structures of the thorax show no acute abnormality. Telemetry leads overlie the chest. IMPRESSION: No active disease. Electronically Signed   By: Kennith Center M.D.   On: 07/03/2021 12:11

## 2021-07-03 NOTE — Progress Notes (Signed)
Pharmacy Antibiotic Note  Madison Williams is a 35 y.o. female admitted on 07/03/2021 with sepsis.  Pharmacy has been consulted for vanc/cefepime dosing. Flagyl per Md  Plan: Vanc 1g x 1 then 750mg  IV q48 due to AKI Cefepime 2g IV q24 per current renal function  Height: 5\' 7"  (170.2 cm) Weight: 55.6 kg (122 lb 9.2 oz) IBW/kg (Calculated) : 61.6  Temp (24hrs), Avg:96.4 F (35.8 C), Min:96.4 F (35.8 C), Max:96.4 F (35.8 C)  Recent Labs  Lab 07/03/21 1025 07/03/21 1026 07/03/21 1045 07/03/21 1050 07/03/21 1251 07/03/21 1400 07/03/21 1448  WBC 24.3*  --   --   --   --   --   --   CREATININE  --  4.70*  --  4.20*  --  4.19*  --   LATICACIDVEN  --   --  3.2*  --  2.1*  --  1.8    Estimated Creatinine Clearance: 16.6 mL/min (A) (by C-G formula based on SCr of 4.19 mg/dL (H)).    No Known Allergies   Thank you for allowing pharmacy to be a part of this patient's care.  14/04/22 07/03/2021 4:03 PM

## 2021-07-03 NOTE — ED Triage Notes (Signed)
EMS reports from home, mother called due to AMS and Hyperglycemia since today. Pt c/o abdominal pain x 1 week.  Bp 76/40 Rr 40 Hr 100 SPO2 96 RA CBG "HIGH"

## 2021-07-04 ENCOUNTER — Observation Stay (HOSPITAL_COMMUNITY): Payer: Medicaid Other

## 2021-07-04 DIAGNOSIS — N170 Acute kidney failure with tubular necrosis: Secondary | ICD-10-CM | POA: Diagnosis not present

## 2021-07-04 DIAGNOSIS — E875 Hyperkalemia: Secondary | ICD-10-CM

## 2021-07-04 DIAGNOSIS — Z20822 Contact with and (suspected) exposure to covid-19: Secondary | ICD-10-CM | POA: Diagnosis present

## 2021-07-04 DIAGNOSIS — I252 Old myocardial infarction: Secondary | ICD-10-CM | POA: Diagnosis not present

## 2021-07-04 DIAGNOSIS — R0602 Shortness of breath: Secondary | ICD-10-CM

## 2021-07-04 DIAGNOSIS — Z23 Encounter for immunization: Secondary | ICD-10-CM | POA: Diagnosis not present

## 2021-07-04 DIAGNOSIS — E876 Hypokalemia: Secondary | ICD-10-CM | POA: Diagnosis not present

## 2021-07-04 DIAGNOSIS — F32A Depression, unspecified: Secondary | ICD-10-CM | POA: Diagnosis present

## 2021-07-04 DIAGNOSIS — E1043 Type 1 diabetes mellitus with diabetic autonomic (poly)neuropathy: Secondary | ICD-10-CM | POA: Diagnosis present

## 2021-07-04 DIAGNOSIS — Z8616 Personal history of COVID-19: Secondary | ICD-10-CM | POA: Diagnosis not present

## 2021-07-04 DIAGNOSIS — E860 Dehydration: Secondary | ICD-10-CM | POA: Diagnosis present

## 2021-07-04 DIAGNOSIS — M069 Rheumatoid arthritis, unspecified: Secondary | ICD-10-CM | POA: Diagnosis present

## 2021-07-04 DIAGNOSIS — F419 Anxiety disorder, unspecified: Secondary | ICD-10-CM | POA: Diagnosis present

## 2021-07-04 DIAGNOSIS — J4520 Mild intermittent asthma, uncomplicated: Secondary | ICD-10-CM | POA: Diagnosis present

## 2021-07-04 DIAGNOSIS — G9341 Metabolic encephalopathy: Secondary | ICD-10-CM | POA: Diagnosis present

## 2021-07-04 DIAGNOSIS — K3184 Gastroparesis: Secondary | ICD-10-CM | POA: Diagnosis present

## 2021-07-04 DIAGNOSIS — R4182 Altered mental status, unspecified: Secondary | ICD-10-CM | POA: Diagnosis present

## 2021-07-04 DIAGNOSIS — E1011 Type 1 diabetes mellitus with ketoacidosis with coma: Secondary | ICD-10-CM | POA: Diagnosis present

## 2021-07-04 DIAGNOSIS — R778 Other specified abnormalities of plasma proteins: Secondary | ICD-10-CM | POA: Diagnosis not present

## 2021-07-04 DIAGNOSIS — I5022 Chronic systolic (congestive) heart failure: Secondary | ICD-10-CM | POA: Diagnosis present

## 2021-07-04 DIAGNOSIS — N179 Acute kidney failure, unspecified: Secondary | ICD-10-CM | POA: Diagnosis present

## 2021-07-04 DIAGNOSIS — I110 Hypertensive heart disease with heart failure: Secondary | ICD-10-CM | POA: Diagnosis present

## 2021-07-04 DIAGNOSIS — E0811 Diabetes mellitus due to underlying condition with ketoacidosis with coma: Secondary | ICD-10-CM | POA: Diagnosis not present

## 2021-07-04 DIAGNOSIS — B954 Other streptococcus as the cause of diseases classified elsewhere: Secondary | ICD-10-CM | POA: Diagnosis present

## 2021-07-04 DIAGNOSIS — E1010 Type 1 diabetes mellitus with ketoacidosis without coma: Secondary | ICD-10-CM | POA: Diagnosis not present

## 2021-07-04 DIAGNOSIS — E10649 Type 1 diabetes mellitus with hypoglycemia without coma: Secondary | ICD-10-CM | POA: Diagnosis not present

## 2021-07-04 DIAGNOSIS — R571 Hypovolemic shock: Secondary | ICD-10-CM | POA: Diagnosis present

## 2021-07-04 DIAGNOSIS — D638 Anemia in other chronic diseases classified elsewhere: Secondary | ICD-10-CM | POA: Diagnosis present

## 2021-07-04 LAB — ECHOCARDIOGRAM COMPLETE
Area-P 1/2: 4.39 cm2
Calc EF: 59.3 %
Height: 67 in
S' Lateral: 2.8 cm
Single Plane A2C EF: 55.6 %
Single Plane A4C EF: 65.4 %
Weight: 1961.21 oz

## 2021-07-04 LAB — BLOOD CULTURE ID PANEL (REFLEXED) - BCID2

## 2021-07-04 LAB — HEPARIN LEVEL (UNFRACTIONATED)
Heparin Unfractionated: 0.31 IU/mL (ref 0.30–0.70)
Heparin Unfractionated: 0.42 IU/mL (ref 0.30–0.70)

## 2021-07-04 LAB — GLUCOSE, CAPILLARY
Glucose-Capillary: 114 mg/dL — ABNORMAL HIGH (ref 70–99)
Glucose-Capillary: 122 mg/dL — ABNORMAL HIGH (ref 70–99)
Glucose-Capillary: 128 mg/dL — ABNORMAL HIGH (ref 70–99)
Glucose-Capillary: 154 mg/dL — ABNORMAL HIGH (ref 70–99)
Glucose-Capillary: 165 mg/dL — ABNORMAL HIGH (ref 70–99)
Glucose-Capillary: 169 mg/dL — ABNORMAL HIGH (ref 70–99)
Glucose-Capillary: 185 mg/dL — ABNORMAL HIGH (ref 70–99)
Glucose-Capillary: 186 mg/dL — ABNORMAL HIGH (ref 70–99)
Glucose-Capillary: 187 mg/dL — ABNORMAL HIGH (ref 70–99)
Glucose-Capillary: 188 mg/dL — ABNORMAL HIGH (ref 70–99)
Glucose-Capillary: 189 mg/dL — ABNORMAL HIGH (ref 70–99)
Glucose-Capillary: 191 mg/dL — ABNORMAL HIGH (ref 70–99)
Glucose-Capillary: 197 mg/dL — ABNORMAL HIGH (ref 70–99)
Glucose-Capillary: 202 mg/dL — ABNORMAL HIGH (ref 70–99)
Glucose-Capillary: 235 mg/dL — ABNORMAL HIGH (ref 70–99)
Glucose-Capillary: >600 mg/dL (ref 70–99)

## 2021-07-04 LAB — COMPREHENSIVE METABOLIC PANEL
ALT: 36 U/L (ref 0–44)
AST: 37 U/L (ref 15–41)
Albumin: 3.4 g/dL — ABNORMAL LOW (ref 3.5–5.0)
Alkaline Phosphatase: 96 U/L (ref 38–126)
Anion gap: 15 (ref 5–15)
BUN: 48 mg/dL — ABNORMAL HIGH (ref 6–20)
CO2: 21 mmol/L — ABNORMAL LOW (ref 22–32)
Calcium: 7.3 mg/dL — ABNORMAL LOW (ref 8.9–10.3)
Chloride: 109 mmol/L (ref 98–111)
Creatinine, Ser: 2.29 mg/dL — ABNORMAL HIGH (ref 0.44–1.00)
GFR, Estimated: 28 mL/min — ABNORMAL LOW (ref >60)
Glucose, Bld: 179 mg/dL — ABNORMAL HIGH (ref 70–99)
Potassium: 3.6 mmol/L (ref 3.5–5.1)
Sodium: 145 mmol/L (ref 135–145)
Total Bilirubin: 0.9 mg/dL (ref 0.3–1.2)
Total Protein: 6.2 g/dL — ABNORMAL LOW (ref 6.5–8.1)

## 2021-07-04 LAB — RAPID URINE DRUG SCREEN, HOSP PERFORMED
Amphetamines: NOT DETECTED
Barbiturates: NOT DETECTED
Benzodiazepines: NOT DETECTED
Cocaine: NOT DETECTED
Opiates: POSITIVE — AB
Tetrahydrocannabinol: NOT DETECTED

## 2021-07-04 LAB — BASIC METABOLIC PANEL
Anion gap: 15 (ref 5–15)
BUN: 48 mg/dL — ABNORMAL HIGH (ref 6–20)
CO2: 20 mmol/L — ABNORMAL LOW (ref 22–32)
Calcium: 7.4 mg/dL — ABNORMAL LOW (ref 8.9–10.3)
Chloride: 109 mmol/L (ref 98–111)
Creatinine, Ser: 2.26 mg/dL — ABNORMAL HIGH (ref 0.44–1.00)
GFR, Estimated: 28 mL/min — ABNORMAL LOW (ref >60)
Glucose, Bld: 178 mg/dL — ABNORMAL HIGH (ref 70–99)
Potassium: 3.6 mmol/L (ref 3.5–5.1)
Sodium: 144 mmol/L (ref 135–145)

## 2021-07-04 LAB — HEMOGLOBIN A1C
Hgb A1c MFr Bld: 9.6 % — ABNORMAL HIGH (ref 4.8–5.6)
Mean Plasma Glucose: 228.82 mg/dL

## 2021-07-04 LAB — HIV ANTIBODY (ROUTINE TESTING W REFLEX): HIV Screen 4th Generation wRfx: NONREACTIVE

## 2021-07-04 LAB — CBC
HCT: 30.2 % — ABNORMAL LOW (ref 36.0–46.0)
Hemoglobin: 10.5 g/dL — ABNORMAL LOW (ref 12.0–15.0)
MCH: 30.4 pg (ref 26.0–34.0)
MCHC: 34.8 g/dL (ref 30.0–36.0)
MCV: 87.5 fL (ref 80.0–100.0)
Platelets: 166 10*3/uL (ref 150–400)
RBC: 3.45 MIL/uL — ABNORMAL LOW (ref 3.87–5.11)
RDW: 14.6 % (ref 11.5–15.5)
WBC: 10.6 10*3/uL — ABNORMAL HIGH (ref 4.0–10.5)
nRBC: 0 % (ref 0.0–0.2)

## 2021-07-04 LAB — TROPONIN I (HIGH SENSITIVITY)
Troponin I (High Sensitivity): 1371 ng/L (ref <18)
Troponin I (High Sensitivity): 1441 ng/L (ref <18)
Troponin I (High Sensitivity): 1278 ng/L (ref <18)
Troponin I (High Sensitivity): 1445 ng/L (ref <18)

## 2021-07-04 LAB — PHOSPHORUS: Phosphorus: 4 mg/dL (ref 2.5–4.6)

## 2021-07-04 LAB — LACTIC ACID, PLASMA: Lactic Acid, Venous: 1.5 mmol/L (ref 0.5–1.9)

## 2021-07-04 LAB — APTT: aPTT: 36 seconds (ref 24–36)

## 2021-07-04 LAB — URINE CULTURE: Culture: >=100000 — AB

## 2021-07-04 LAB — CK TOTAL AND CKMB (NOT AT ARMC)
CK, MB: 15.5 ng/mL — ABNORMAL HIGH (ref 0.5–5.0)
Relative Index: 5.7 — ABNORMAL HIGH (ref 0.0–2.5)
Total CK: 270 U/L — ABNORMAL HIGH (ref 38–234)

## 2021-07-04 LAB — PROTIME-INR
INR: 1.1 (ref 0.8–1.2)
Prothrombin Time: 14 seconds (ref 11.4–15.2)

## 2021-07-04 LAB — MAGNESIUM: Magnesium: 2 mg/dL (ref 1.7–2.4)

## 2021-07-04 LAB — BETA-HYDROXYBUTYRIC ACID: Beta-Hydroxybutyric Acid: 2.97 mmol/L — ABNORMAL HIGH (ref 0.05–0.27)

## 2021-07-04 LAB — PROCALCITONIN: Procalcitonin: 2.71 ng/mL

## 2021-07-04 LAB — STREP PNEUMONIAE URINARY ANTIGEN: Strep Pneumo Urinary Antigen: NEGATIVE

## 2021-07-04 MED ORDER — METOPROLOL TARTRATE 12.5 MG HALF TABLET
12.5000 mg | ORAL_TABLET | Freq: Two times a day (BID) | ORAL | Status: DC
  Administered 2021-07-04: 12.5 mg via ORAL
  Filled 2021-07-04: qty 1

## 2021-07-04 MED ORDER — TECHNETIUM TO 99M ALBUMIN AGGREGATED
4.4000 | Freq: Once | INTRAVENOUS | Status: AC | PRN
  Administered 2021-07-04: 4.4 via INTRAVENOUS

## 2021-07-04 MED ORDER — MORPHINE SULFATE (PF) 2 MG/ML IV SOLN
1.0000 mg | Freq: Once | INTRAVENOUS | Status: AC
  Administered 2021-07-04: 1 mg via INTRAVENOUS
  Filled 2021-07-04: qty 1

## 2021-07-04 MED ORDER — FLUOXETINE HCL 20 MG PO CAPS
20.0000 mg | ORAL_CAPSULE | Freq: Two times a day (BID) | ORAL | Status: DC
  Administered 2021-07-04 – 2021-07-07 (×6): 20 mg via ORAL
  Filled 2021-07-04 (×7): qty 1

## 2021-07-04 MED ORDER — CLONAZEPAM 1 MG PO TABS
1.0000 mg | ORAL_TABLET | Freq: Two times a day (BID) | ORAL | Status: DC
  Administered 2021-07-04 – 2021-07-07 (×7): 1 mg via ORAL
  Filled 2021-07-04 (×7): qty 1

## 2021-07-04 MED ORDER — HEPARIN (PORCINE) 25000 UT/250ML-% IV SOLN
950.0000 [IU]/h | INTRAVENOUS | Status: DC
  Administered 2021-07-04: 950 [IU]/h via INTRAVENOUS
  Filled 2021-07-04: qty 250

## 2021-07-04 MED ORDER — SODIUM CHLORIDE 0.9 % IV SOLN
2.0000 g | INTRAVENOUS | Status: DC
  Administered 2021-07-04 – 2021-07-05 (×2): 2 g via INTRAVENOUS
  Filled 2021-07-04 (×2): qty 20

## 2021-07-04 MED ORDER — HEPARIN (PORCINE) 25000 UT/250ML-% IV SOLN
950.0000 [IU]/h | INTRAVENOUS | Status: DC
  Administered 2021-07-04 – 2021-07-05 (×2): 950 [IU]/h via INTRAVENOUS
  Filled 2021-07-04: qty 250

## 2021-07-04 MED ORDER — INSULIN ASPART 100 UNIT/ML IJ SOLN
1.0000 [IU] | INTRAMUSCULAR | Status: DC
Start: 2021-07-04 — End: 2021-07-05
  Administered 2021-07-04: 1 [IU] via SUBCUTANEOUS
  Administered 2021-07-04: 3 [IU] via SUBCUTANEOUS
  Administered 2021-07-05: 1 [IU] via SUBCUTANEOUS

## 2021-07-04 MED ORDER — LAMOTRIGINE 25 MG PO TABS
25.0000 mg | ORAL_TABLET | Freq: Every day | ORAL | Status: DC
  Administered 2021-07-05 – 2021-07-06 (×3): 25 mg via ORAL
  Filled 2021-07-04 (×3): qty 1

## 2021-07-04 MED ORDER — NITROGLYCERIN 0.4 MG SL SUBL
0.4000 mg | SUBLINGUAL_TABLET | SUBLINGUAL | Status: AC | PRN
  Administered 2021-07-05: 0.4 mg via SUBLINGUAL
  Filled 2021-07-04: qty 1

## 2021-07-04 MED ORDER — SODIUM CHLORIDE 0.9 % IV SOLN: INTRAVENOUS | Status: DC | PRN

## 2021-07-04 MED ORDER — SODIUM CHLORIDE 0.9 % IV SOLN
2.0000 g | Freq: Two times a day (BID) | INTRAVENOUS | Status: DC
  Administered 2021-07-04: 2 g via INTRAVENOUS
  Filled 2021-07-04: qty 2

## 2021-07-04 MED ORDER — ONDANSETRON HCL 4 MG/2ML IJ SOLN
4.0000 mg | Freq: Once | INTRAMUSCULAR | Status: AC
  Administered 2021-07-04: 4 mg via INTRAVENOUS
  Filled 2021-07-04: qty 2

## 2021-07-04 MED ORDER — METOPROLOL TARTRATE 25 MG PO TABS
25.0000 mg | ORAL_TABLET | Freq: Two times a day (BID) | ORAL | Status: DC
  Administered 2021-07-04 – 2021-07-07 (×6): 25 mg via ORAL
  Filled 2021-07-04 (×7): qty 1

## 2021-07-04 MED ORDER — INSULIN DETEMIR 100 UNIT/ML ~~LOC~~ SOLN
12.0000 [IU] | Freq: Two times a day (BID) | SUBCUTANEOUS | Status: DC
  Administered 2021-07-04 – 2021-07-05 (×3): 12 [IU] via SUBCUTANEOUS
  Filled 2021-07-04 (×4): qty 0.12

## 2021-07-04 MED ORDER — CLONAZEPAM 0.5 MG PO TABS
0.5000 mg | ORAL_TABLET | Freq: Two times a day (BID) | ORAL | Status: DC | PRN
  Administered 2021-07-05 – 2021-07-06 (×3): 0.5 mg via ORAL
  Filled 2021-07-04 (×3): qty 1

## 2021-07-04 MED ORDER — ATORVASTATIN CALCIUM 40 MG PO TABS
80.0000 mg | ORAL_TABLET | Freq: Once | ORAL | Status: AC
Start: 2021-07-04 — End: 2021-07-04
  Administered 2021-07-04: 80 mg via ORAL
  Filled 2021-07-04: qty 2

## 2021-07-04 MED ORDER — NITROGLYCERIN 0.4 MG SL SUBL
SUBLINGUAL_TABLET | SUBLINGUAL | Status: AC
  Administered 2021-07-04: 0.4 mg via SUBLINGUAL
  Filled 2021-07-04: qty 1

## 2021-07-04 NOTE — Progress Notes (Signed)
ANTICOAGULATION CONSULT NOTE - Initial Consult  Pharmacy Consult for heparin Indication: VTE treatment  No Known Allergies  Patient Measurements: Height: 5\' 7"  (170.2 cm) Weight: 55.6 kg (122 lb 9.2 oz) IBW/kg (Calculated) : 61.6 Heparin Dosing Weight: 55.6kg  Vital Signs: Temp: 100.3 F (37.9 C) (12/05 0000) Temp Source: Oral (12/05 0000) BP: 122/78 (12/05 0100) Pulse Rate: 103 (12/05 0100)  Labs: Recent Labs    07/03/21 1025 07/03/21 1026 07/03/21 1050 07/03/21 1400 07/03/21 1448 07/03/21 1812 07/03/21 2018 07/03/21 2147 07/04/21 0027  HGB 11.8*  --  13.9  --   --   --   --   --   --   HCT 43.2  --  41.0  --   --   --   --   --   --   PLT 304  --   --   --   --   --   --   --   --   CREATININE  --    < > 4.20* 4.19*  --  3.42*  --  2.60*  --   TROPONINIHS  --   --   --   --  271*  --  815*  --  1,371*   < > = values in this interval not displayed.    Estimated Creatinine Clearance: 26.8 mL/min (A) (by C-G formula based on SCr of 2.6 mg/dL (H)).   Medical History: Past Medical History:  Diagnosis Date   Asthma    Diabetes mellitus without complication (HCC)    Rheumatoid arthritis (HCC)      Assessment: 35 yo female admitted for DKA/AKI/ENcephalopathy, rt sided chest pain.  Hx of STEMI 2021 and thrombocytopenia.  Pharmacy consulted to dose heparin drip for VTE treatment, no prior AC noted  CBC WNL Trop 1371 Scr 2.6 Baseline labs ordered STAT   Goal of Therapy:  Heparin level 0.3-0.7 units/ml Monitor platelets by anticoagulation protocol: Yes   Plan:  No bolus, enoxaparin 30mg  given tonight at 2115 Start heparin drip at 950 units/hr Heparin level in 6 hours Daily CBC   RPh 07/04/2021, 1:34 AM

## 2021-07-04 NOTE — Progress Notes (Signed)
PHARMACY - PHYSICIAN COMMUNICATION CRITICAL VALUE ALERT - BLOOD CULTURE IDENTIFICATION (BCID)  Madison Williams is an 35 y.o. female  presented to the ED on 07/03/21 with AMS and was found to have hyperglycemia.  She was started on cefepime, vancomycin and flagyl on adm for broad coverage for suspected sepsis. One of four blood culture bottle collected on 12/4 has GPC in chains (BCID= strep species).   Name of physician (or Provider) Contacted: Dr. Rhona Leavens  Current antibiotics: cefepime, vancomycin, flagyl  Changes to prescribed antibiotics recommended:  - change abx to ceftriaxone 2gm q24h  Results for orders placed or performed during the hospital encounter of 07/03/21  Blood Culture ID Panel (Reflexed) (Collected: 07/03/2021 10:47 AM)  Result Value Ref Range   Enterococcus faecalis NOT DETECTED NOT DETECTED   Enterococcus Faecium NOT DETECTED NOT DETECTED   Listeria monocytogenes NOT DETECTED NOT DETECTED   Staphylococcus species NOT DETECTED NOT DETECTED   Staphylococcus aureus (BCID) NOT DETECTED NOT DETECTED   Staphylococcus epidermidis NOT DETECTED NOT DETECTED   Staphylococcus lugdunensis NOT DETECTED NOT DETECTED   Streptococcus species DETECTED (A) NOT DETECTED   Streptococcus agalactiae NOT DETECTED NOT DETECTED   Streptococcus pneumoniae NOT DETECTED NOT DETECTED   Streptococcus pyogenes NOT DETECTED NOT DETECTED   A.calcoaceticus-baumannii NOT DETECTED NOT DETECTED   Bacteroides fragilis NOT DETECTED NOT DETECTED   Enterobacterales NOT DETECTED NOT DETECTED   Enterobacter cloacae complex NOT DETECTED NOT DETECTED   Escherichia coli NOT DETECTED NOT DETECTED   Klebsiella aerogenes NOT DETECTED NOT DETECTED   Klebsiella oxytoca NOT DETECTED NOT DETECTED   Klebsiella pneumoniae NOT DETECTED NOT DETECTED   Proteus species NOT DETECTED NOT DETECTED   Salmonella species NOT DETECTED NOT DETECTED   Serratia marcescens NOT DETECTED NOT DETECTED   Haemophilus influenzae NOT  DETECTED NOT DETECTED   Neisseria meningitidis NOT DETECTED NOT DETECTED   Pseudomonas aeruginosa NOT DETECTED NOT DETECTED   Stenotrophomonas maltophilia NOT DETECTED NOT DETECTED   Candida albicans NOT DETECTED NOT DETECTED   Candida auris NOT DETECTED NOT DETECTED   Candida glabrata NOT DETECTED NOT DETECTED   Candida krusei NOT DETECTED NOT DETECTED   Candida parapsilosis NOT DETECTED NOT DETECTED   Candida tropicalis NOT DETECTED NOT DETECTED   Cryptococcus neoformans/gattii NOT DETECTED NOT DETECTED    [name] 07/04/2021  11:33 AM

## 2021-07-04 NOTE — Progress Notes (Signed)
Pharmacy Antibiotic Note  Madison Williams is a 35 y.o. female presented to the ED on 07/03/2021 with AMS and was found to have hyperglycemia.  She's currently on vancomycin, cefepime and flagyl for broad empiric coverage for sepsis.    Today, 07/04/2021: - day #2 abx - Tmax 100.4 - all cultures have been negative thus far - scr trending down with 2.26 (crcl~30). On LR @125 /hr  Plan: - continue vancomycin 750 mg IV q48h for now - change cefepime to 2gm IV q12h - flagyl 500 mg q12h per MD - monitor renal function closely and adjust dose as needed ________________________________________________ Height: 5\' 7"  (170.2 cm) Weight: 55.6 kg (122 lb 9.2 oz) IBW/kg (Calculated) : 61.6  Temp (24hrs), Avg:99 F (37.2 C), Min:96.4 F (35.8 C), Max:100.4 F (38 C)  Recent Labs  Lab 07/03/21 1025 07/03/21 1026 07/03/21 1045 07/03/21 1050 07/03/21 1251 07/03/21 1400 07/03/21 1448 07/03/21 1812 07/03/21 2147 07/03/21 2234 07/04/21 0243  WBC 24.3*  --   --   --   --   --   --   --   --   --  10.6*  CREATININE  --    < >  --  4.20*  --  4.19*  --  3.42* 2.60*  --  2.26*  2.29*  LATICACIDVEN  --   --  3.2*  --  2.1*  --  1.8  --   --  1.5  --    < > = values in this interval not displayed.    Estimated Creatinine Clearance: 30.4 mL/min (A) (by C-G formula based on SCr of 2.29 mg/dL (H)).    No Known Allergies  12/4 cefepime>> 12/4  vanc>> 12/4 flagyl>>12/11  12/4 mrsa pcr: neg 12/4 ucx:  12/4 bcx x2:   Thank you for allowing pharmacy to be a part of this patient's care.  14/4 07/04/2021 7:14 AM

## 2021-07-04 NOTE — Progress Notes (Signed)
ANTICOAGULATION CONSULT NOTE - Follow Up Consult  Pharmacy Consult for heparin Indication: elevated troponin  No Known Allergies  Patient Measurements: Height: 5\' 7"  (170.2 cm) Weight: 55.6 kg (122 lb 9.2 oz) IBW/kg (Calculated) : 61.6 Heparin Dosing Weight: 56 kg  Vital Signs: Temp: 98.2 F (36.8 C) (12/05 1200) Temp Source: Axillary (12/05 1200) BP: 128/88 (12/05 1400) Pulse Rate: 72 (12/05 1600)  Labs: Recent Labs    07/03/21 1025 07/03/21 1026 07/03/21 1050 07/03/21 1400 07/03/21 1448 07/03/21 1812 07/03/21 2018 07/03/21 2147 07/04/21 0027 07/04/21 0243 07/04/21 0521 07/04/21 0930  HGB 11.8*  --  13.9  --   --   --   --   --   --  10.5*  --   --   HCT 43.2  --  41.0  --   --   --   --   --   --  30.2*  --   --   PLT 304  --   --   --   --   --   --   --   --  166  --   --   APTT  --   --   --   --   --   --   --   --   --  36  --   --   LABPROT  --   --   --   --   --   --   --   --   --  14.0  --   --   INR  --   --   --   --   --   --   --   --   --  1.1  --   --   HEPARINUNFRC  --   --   --   --   --   --   --   --   --   --   --  0.31  CREATININE  --    < > 4.20* 4.19*  --  3.42*  --  2.60*  --  2.26*  2.29*  --   --   CKTOTAL  --   --   --  270*  --   --   --   --   --   --   --   --   CKMB  --   --   --  15.5*  --   --   --   --   --   --   --   --   TROPONINIHS  --   --   --   --    < >  --    < >  --    < > 1,441* 1,278* 1,445*   < > = values in this interval not displayed.     Estimated Creatinine Clearance: 30.4 mL/min (A) (by C-G formula based on SCr of 2.29 mg/dL (H)).   Assessment: Patient is a 35 y.o F with hx DM presented to the ED on 07/03/21 with AMS and was found to have hyperglycemia.  She's currently on heparin drip for elevated troponin.  Today, 07/04/2021: - Heparin level is therapeutic at 0.42 on heparin 950 units/hr - Troponins peaked today at 1445 - Hgb down 10.5 and plts 166K (on LR at 125 ml/hr) - V/Q scan did not show evidence  of pulmonary embolism   Goal of Therapy:  Heparin level 0.3-0.7 units/ml Monitor platelets by  anticoagulation protocol: Yes   Plan:  - Continue heparin drip at 950 units/hr - Monitor for s/sx bleeding, daily HL and CBC - F/u recom from cardiology team  Rexford Maus, PharmD 07/04/2021 4:31 PM

## 2021-07-04 NOTE — Progress Notes (Signed)
NAME:  Madison Williams, MRN:  [id number], DOB:  01-15-1986, LOS: 0 ADMISSION DATE:  07/03/2021, CONSULTATION DATE:  12/4 REFERRING MD:  Lynelle Doctor, CHIEF COMPLAINT:  Chest pain, confused   History of Present Illness:  35 y/o female admitted with severe DKA and chest pain with a notably elevated troponin.    Pertinent  Medical History  Asthma DM1 poorly controlled with complications Depression RA Cigarette smoker Chronic systolic heart failure LVEF 35-40% on 10/2019 LHC (no CAD) COVID 19 03/2021 Gastroparesis   Significant Hospital Events: Including procedures, antibiotic start and stop dates in addition to other pertinent events   12/3 admission DKA 12/5 gap closed  Imaging: 12/5 Echo > LVEF normal, RV size/function normal  Micro 12/4 sars cov 2/flu > neg 12/4 urine >  12/4 blood >   Abx 12/4 cefepime 12/4 ceftriaxone x1 12/4 flagyl x1 12/4 vanc x1   Interim History / Subjective:   Back pain Wants to eat More awake/alert Feels better    Objective   Blood pressure 119/77, pulse 95, temperature (!) 100.4 F (38 C), temperature source Oral, resp. rate (!) 24, height 5\' 7"  (1.702 m), weight 55.6 kg, last menstrual period 03/16/2018, SpO2 91 %.        Intake/Output Summary (Last 24 hours) at 07/04/2021 14/11/2020 Last data filed at 07/04/2021 0551 Gross per 24 hour  Intake 3317.67 ml  Output 500 ml  Net 2817.67 ml   Filed Weights   07/03/21 1530  Weight: 55.6 kg    Examination: General:  Resting comfortably in bed HENT: NCAT OP clear PULM: CTA B, normal effort CV: RRR, no mgr GI: BS+, soft, nontender MSK: normal bulk and tone Neuro: awake, alert, no distress, MAEW   Resolved Hospital Problem list     Assessment & Plan:     Severe metabolic acidosis due to DKA > improved Change from insulin infusion per endo-tool protocol to sub cutaneous insulin Accuchecks AC and qHC  Acute encephalopathy due to DKA> resolved Hold sedatives Frequent  orientation  Circulatory shock due to volume depletion > resolved Monitor hemodynamics  AKI > improving Monitor BMET and UOP Replace electrolytes as needed  Chronic systolic heart failure present on admisison Troponin elevation; 12 lead with non-specific ST wave changes anterior precordial leads Chest pain; ddx includes PE given elevated d-dimer, vasospasm Heparin infusion to continue Cardiology consult V/Q pending Send urine drug screen Restart home metoprolol 1/2 dose  Anxiety Restart home prozac and clonazepam  Anemia of chronic diease Monitor for bleeding Transfuse PRBC for Hgb < 7 gm/dL  gastroparesis Advance diet Hold home reglan, she says she stopped due to tremor  Best Practice (right click and "Reselect all SmartList Selections" daily)   Per [facility]  Labs   CBC: Recent Labs  Lab 07/03/21 1025 07/03/21 1050 07/04/21 0243  WBC 24.3*  --  10.6*  NEUTROABS 17.4*  --   --   HGB 11.8* 13.9 10.5*  HCT 43.2 41.0 30.2*  MCV 112.5*  --  87.5  PLT 304  --  166    Basic Metabolic Panel: Recent Labs  Lab 07/03/21 1026 07/03/21 1050 07/03/21 1400 07/03/21 1448 07/03/21 1812 07/03/21 2147 07/04/21 0243  NA 123* 124* 134*  --  140 144 144  145  K 6.9* 6.7* 4.2  --  4.0 3.6 3.6  3.6  CL 80* 95* 95*  --  102 107 109  109  CO2 <7*  --  <7*  --  11* 17* 20*  21*  GLUCOSE 1,498* >700* 1,016*  --  518* 241* 178*  179*  BUN 59* 51* 61*  --  53* 51* 48*  48*  CREATININE 4.70* 4.20* 4.19*  --  3.42* 2.60* 2.26*  2.29*  CALCIUM 7.1*  --  7.0*  --  7.3* 7.5* 7.4*  7.3*  MG  --   --   --  3.1*  --   --  2.0  PHOS  --   --   --  8.4*  --   --  4.0   GFR: Estimated Creatinine Clearance: 30.4 mL/min (A) (by C-G formula based on SCr of 2.29 mg/dL (H)). Recent Labs  Lab 07/03/21 1025 07/03/21 1045 07/03/21 1251 07/03/21 1448 07/03/21 2234 07/04/21 0243  PROCALCITON  --   --   --  2.89  --  2.71  WBC 24.3*  --   --   --   --  10.6*  LATICACIDVEN  --  3.2*  2.1* 1.8 1.5  --     Liver Function Tests: Recent Labs  Lab 07/03/21 1026 07/04/21 0243  AST 28 37  ALT 40 36  ALKPHOS 137* 96  BILITOT 2.5* 0.9  PROT 6.8 6.2*  ALBUMIN 4.0 3.4*   Recent Labs  Lab 07/03/21 1026  LIPASE 45   No results for input(s): AMMONIA in the last 168 hours.  ABG    Component Value Date/Time   PHART 6.894 (LL) 07/03/2021 1120   PCO2ART 10.7 (LL) 07/03/2021 1120   PO2ART 165 (H) 07/03/2021 1120   HCO3 2.0 (L) 07/03/2021 1120   TCO2 6 (L) 07/03/2021 1050   ACIDBASEDEF NOT CALCULATED 07/03/2021 1120   O2SAT 96.4 07/03/2021 1120     Coagulation Profile: Recent Labs  Lab 07/04/21 0243  INR 1.1    Cardiac Enzymes: Recent Labs  Lab 07/03/21 1400  CKTOTAL 270*  CKMB 15.5*    HbA1C: HbA1c, POC (controlled diabetic range)  Date/Time Value Ref Range Status  07/07/2020 01:56 PM 9.8 (A) 0.0 - 7.0 % Final  04/06/2020 10:51 AM 10.7 (A) 0.0 - 7.0 % Final   Hgb A1c MFr Bld  Date/Time Value Ref Range Status  07/03/2021 10:25 AM 9.6 (H) 4.8 - 5.6 % Final    Comment:    (NOTE) Pre diabetes:          5.7%-6.4%  Diabetes:              >6.4%  Glycemic control for   <7.0% adults with diabetes   01/16/2021 08:02 AM 12.2 (H) 4.8 - 5.6 % Final    Comment:    (NOTE)         Prediabetes: 5.7 - 6.4         Diabetes: >6.4         Glycemic control for adults with diabetes: <7.0     CBG: Recent Labs  Lab 07/04/21 0308 07/04/21 0408 07/04/21 0502 07/04/21 0557 07/04/21 0745  GLUCAP 154* 185* 187* 169* 202*    Critical care time: 33 minutes     Heber [place], MD [facility] Pager: [phone] Cell: [phone] After 7:00 pm call Elink  [phone]

## 2021-07-04 NOTE — Progress Notes (Addendum)
eLink Physician-Brief Progress Note Patient Name: Madison Williams DOB: 05-03-1986 MRN: [id number]   Date of Service  07/04/2021  HPI/Events of Note  This is our DKA pt with rising Trop's, nowC/O teeth and CP, no PRNs, Trop's up this AM  Similar pain that happened when she got STEMI as per patient.  Camera evaluation: Chest pain in rt side , tooth pain. 0 to 10 pain is 8 as per RN discussion.    SBP 120  LHC in 2021.   eICU Interventions  - stat EKG done: reviewed while in room over Video: sinus tach, NO STEMI. Qtc prolonged. Qrs normal. - S/L NTG stat w/o much pain relief.  Morphine 1 mg IV ordered, asp precautions. Send troponin now, instead of 5 AM.  V/Q scan not done yet.      Intervention Category Intermediate Interventions: Pain - evaluation and management  Ranee Gosselin 07/04/2021, 4:51 AM

## 2021-07-04 NOTE — Progress Notes (Signed)
  Echocardiogram 2D Echocardiogram has been performed.  Madison Williams 07/04/2021, 8:40 AM

## 2021-07-04 NOTE — Progress Notes (Signed)
PROGRESS NOTE    Madison Williams  TGG:[id number] DOB: [birth year] DOA: 07/03/2021 PCP: Charlott Rakes, MD    Brief Narrative:  35yo with hx DM2, RA, recent COVID who presented with AMS with hyperglycemia.Pt was admitted for DKA, found to have markedly elevated troponins, ARF, and profound acidosis  Assessment & Plan:   Principal Problem:   DKA (diabetic ketoacidosis) ([place]) Active Problems:   AKI (acute kidney injury) ([place])   Type 1 diabetes mellitus with hyperglycemia (HCC)   Mild intermittent asthma without complication   Elevated troponin   Rheumatoid arthritis (HCC)   Hyperkalemia   SIRS (systemic inflammatory response syndrome) ([place])   Septic shock ([place])   Hypermagnesemia   Hyperphosphatemia  Active Problems:   AKI (acute kidney injury) ([place]) Suspected dehydration in setting of DKA Given vigorous rehydration. Renal function now improving Repeat bmet in AM     SIRS (systemic inflammatory response syndrome) ([place])   Vs sepsis due to undetermined organism Sepsis criteria met but these findings could also be DKA. Was started on broad-spectrum IV antibiotic therapy Procal at presentation was elevated at 2.71 Continued on IVF hydration Afebrile 1/2 blood cx with strep species. Urine cx with >100,000 lactobacillus species     Elevated troponin Initially suspected demand ischemia. Trend troponin levels trended up, peak to 1445 Pt has reported hx of chest pains Therapeutic heparin gtt started overnight Cardiology following VQ is pending     Hyperkalemia Resolved. Recheck bmet in AM     Hyperphosphatemia Continue IV fluids as tolerated     Hypermagnesemia Continue IV fluids Mg levels normalized this AM    Mild intermittent asthma without complication Supplemental oxygen as needed. Bronchodilators as needed. No audible wheezing this AM, on minimal O2 support     Rheumatoid arthritis (HCC) Currently not on therapy. Rheumatoid factor and anti-CCP were  obtained, pending  DVT prophylaxis: Heparin gtt Code Status: Full Family Communication: Pt in room, family not at bedside  Status is: Observation  The patient will require care spanning > 2 midnights and should be moved to inpatient because: Severity of illness   Consultants:  PCCM Cardiology  Procedures:    Antimicrobials: Anti-infectives (From admission, onward)    Start     Dose/Rate Route Frequency Ordered Stop   07/05/21 1800  vancomycin (VANCOREADY) IVPB 750 mg/150 mL  Status:  Discontinued        750 mg 150 mL/hr over 60 Minutes Intravenous Every 48 hours 07/03/21 1607 07/04/21 1138   07/04/21 2200  cefTRIAXone (ROCEPHIN) 2 g in sodium chloride 0.9 % 100 mL IVPB        2 g 200 mL/hr over 30 Minutes Intravenous Every 24 hours 07/04/21 1138     07/04/21 0800  ceFEPIme (MAXIPIME) 2 g in sodium chloride 0.9 % 100 mL IVPB  Status:  Discontinued        2 g 200 mL/hr over 30 Minutes Intravenous Every 12 hours 07/04/21 0720 07/04/21 1138   07/03/21 1800  metroNIDAZOLE (FLAGYL) IVPB 500 mg  Status:  Discontinued        500 mg 100 mL/hr over 60 Minutes Intravenous Every 12 hours 07/03/21 1558 07/04/21 1138   07/03/21 1700  ceFEPIme (MAXIPIME) 2 g in sodium chloride 0.9 % 100 mL IVPB  Status:  Discontinued        2 g 200 mL/hr over 30 Minutes Intravenous Every 24 hours 07/03/21 1558 07/04/21 0720   07/03/21 1645  vancomycin (VANCOREADY) IVPB 1000 mg/200 mL  1,000 mg 200 mL/hr over 60 Minutes Intravenous  Once 07/03/21 1558 07/03/21 1747   07/03/21 1115  cefTRIAXone (ROCEPHIN) 2 g in sodium chloride 0.9 % 100 mL IVPB        2 g 200 mL/hr over 30 Minutes Intravenous  Once 07/03/21 1110 07/03/21 1152       Subjective: Feeling tired this AM. Reports generalized chest pains  Objective: Vitals:   07/04/21 0900 07/04/21 1000 07/04/21 1200 07/04/21 1300  BP: 120/74 127/73 (!) 153/99 (!) 141/90  Pulse: 83 86 79 74  Resp: (!) 25 (!) 24 15   Temp:   98.2 F (36.8 C)    TempSrc:   Axillary   SpO2: 99% 98% 99% 98%  Weight:      Height:        Intake/Output Summary (Last 24 hours) at 07/04/2021 1325 Last data filed at 07/04/2021 1300 Gross per 24 hour  Intake 4357.6 ml  Output 500 ml  Net 3857.6 ml   Filed Weights   07/03/21 1530  Weight: 55.6 kg    Examination: General exam: Conversant, in no acute distress Respiratory system: normal chest rise, clear, no audible wheezing Cardiovascular system: regular rhythm, s1-s2 Gastrointestinal system: Nondistended, nontender, pos BS Central nervous system: No seizures, no tremors Extremities: No cyanosis, no joint deformities Skin: No rashes, no pallor Psychiatry: Affect normal // no auditory hallucinations   Data Reviewed: I have personally reviewed following labs and imaging studies  CBC: Recent Labs  Lab 07/03/21 1025 07/03/21 1050 07/04/21 0243  WBC 24.3*  --  10.6*  NEUTROABS 17.4*  --   --   HGB 11.8* 13.9 10.5*  HCT 43.2 41.0 30.2*  MCV 112.5*  --  87.5  PLT 304  --  564   Basic Metabolic Panel: Recent Labs  Lab 07/03/21 1026 07/03/21 1050 07/03/21 1400 07/03/21 1448 07/03/21 1812 07/03/21 2147 07/04/21 0243  NA 123* 124* 134*  --  140 144 144  145  K 6.9* 6.7* 4.2  --  4.0 3.6 3.6  3.6  CL 80* 95* 95*  --  102 107 109  109  CO2 <7*  --  <7*  --  11* 17* 20*  21*  GLUCOSE 1,498* >700* 1,016*  --  518* 241* 178*  179*  BUN 59* 51* 61*  --  53* 51* 48*  48*  CREATININE 4.70* 4.20* 4.19*  --  3.42* 2.60* 2.26*  2.29*  CALCIUM 7.1*  --  7.0*  --  7.3* 7.5* 7.4*  7.3*  MG  --   --   --  3.1*  --   --  2.0  PHOS  --   --   --  8.4*  --   --  4.0   GFR: Estimated Creatinine Clearance: 30.4 mL/min (A) (by C-G formula based on SCr of 2.29 mg/dL (H)). Liver Function Tests: Recent Labs  Lab 07/03/21 1026 07/04/21 0243  AST 28 37  ALT 40 36  ALKPHOS 137* 96  BILITOT 2.5* 0.9  PROT 6.8 6.2*  ALBUMIN 4.0 3.4*   Recent Labs  Lab 07/03/21 1026  LIPASE 45   No  results for input(s): AMMONIA in the last 168 hours. Coagulation Profile: Recent Labs  Lab 07/04/21 0243  INR 1.1   Cardiac Enzymes: Recent Labs  Lab 07/03/21 1400  CKTOTAL 270*  CKMB 15.5*   BNP (last 3 results) No results for input(s): PROBNP in the last 8760 hours. HbA1C: Recent Labs    07/03/21 1025  HGBA1C 9.6*   CBG: Recent Labs  Lab 07/04/21 0745 07/04/21 0849 07/04/21 0949 07/04/21 1108 07/04/21 1208  GLUCAP 202* 189* 165* 191* 235*   Lipid Profile: No results for input(s): CHOL, HDL, LDLCALC, TRIG, CHOLHDL, LDLDIRECT in the last 72 hours. Thyroid Function Tests: No results for input(s): TSH, T4TOTAL, FREET4, T3FREE, THYROIDAB in the last 72 hours. Anemia Panel: No results for input(s): VITAMINB12, FOLATE, FERRITIN, TIBC, IRON, RETICCTPCT in the last 72 hours. Sepsis Labs: Recent Labs  Lab 07/03/21 1045 07/03/21 1251 07/03/21 1448 07/03/21 2234 07/04/21 0243  PROCALCITON  --   --  2.89  --  2.71  LATICACIDVEN 3.2* 2.1* 1.8 1.5  --     Recent Results (from the past 240 hour(s))  Blood culture (routine x 2)     Status: None (Preliminary result)   Collection Time: 07/03/21 10:47 AM   Specimen: BLOOD  Result Value Ref Range Status   Specimen Description   Final    BLOOD BLOOD RIGHT ARM Performed at [facility] [address] [place], [place] [address]    Special Requests   Final    BOTTLES DRAWN AEROBIC AND ANAEROBIC Blood Culture adequate volume Performed at [facility] [address], [place], [place] [address]    Culture  Setup Time   Final    GRAM POSITIVE COCCI IN CHAINS ANAEROBIC BOTTLE ONLY CRITICAL RESULT CALLED TO, READ BACK BY AND VERIFIED WITH: Vena Austria D.WOFFORD ON [id number] AT 0932 BY E.PARRISH Performed at [facility], [place] [address], [place], [place] [address]    Culture GRAM POSITIVE COCCI  Final   Report Status PENDING  Incomplete  Blood Culture ID Panel (Reflexed)     Status:  Abnormal   Collection Time: 07/03/21 10:47 AM  Result Value Ref Range Status   Enterococcus faecalis NOT DETECTED NOT DETECTED Final   Enterococcus Faecium NOT DETECTED NOT DETECTED Final   Listeria monocytogenes NOT DETECTED NOT DETECTED Final   Staphylococcus species NOT DETECTED NOT DETECTED Final   Staphylococcus aureus (BCID) NOT DETECTED NOT DETECTED Final   Staphylococcus epidermidis NOT DETECTED NOT DETECTED Final   Staphylococcus lugdunensis NOT DETECTED NOT DETECTED Final   Streptococcus species DETECTED (A) NOT DETECTED Final    Comment: Not Enterococcus species, Streptococcus agalactiae, Streptococcus pyogenes, or Streptococcus pneumoniae. CRITICAL RESULT CALLED TO, READ BACK BY AND VERIFIED WITH: PHARM D D.WOFFORD ON [id number] AT 8250 BY E.PARRISH    Streptococcus agalactiae NOT DETECTED NOT DETECTED Final   Streptococcus pneumoniae NOT DETECTED NOT DETECTED Final   Streptococcus pyogenes NOT DETECTED NOT DETECTED Final   A.calcoaceticus-baumannii NOT DETECTED NOT DETECTED Final   Bacteroides fragilis NOT DETECTED NOT DETECTED Final   Enterobacterales NOT DETECTED NOT DETECTED Final   Enterobacter cloacae complex NOT DETECTED NOT DETECTED Final   Escherichia coli NOT DETECTED NOT DETECTED Final   Klebsiella aerogenes NOT DETECTED NOT DETECTED Final   Klebsiella oxytoca NOT DETECTED NOT DETECTED Final   Klebsiella pneumoniae NOT DETECTED NOT DETECTED Final   Proteus species NOT DETECTED NOT DETECTED Final   Salmonella species NOT DETECTED NOT DETECTED Final   Serratia marcescens NOT DETECTED NOT DETECTED Final   Haemophilus influenzae NOT DETECTED NOT DETECTED Final   Neisseria meningitidis NOT DETECTED NOT DETECTED Final   Pseudomonas aeruginosa NOT DETECTED NOT DETECTED Final   Stenotrophomonas maltophilia NOT DETECTED NOT DETECTED Final   Candida albicans NOT DETECTED NOT DETECTED Final   Candida auris NOT DETECTED NOT DETECTED Final  Candida glabrata NOT DETECTED  NOT DETECTED Final   Candida krusei NOT DETECTED NOT DETECTED Final   Candida parapsilosis NOT DETECTED NOT DETECTED Final   Candida tropicalis NOT DETECTED NOT DETECTED Final   Cryptococcus neoformans/gattii NOT DETECTED NOT DETECTED Final    Comment: Performed at [facility], [place] [address], [place], [place] [address]  Blood culture (routine x 2)     Status: None (Preliminary result)   Collection Time: 07/03/21 11:03 AM   Specimen: BLOOD  Result Value Ref Range Status   Specimen Description   Final    BLOOD RIGHT ANTECUBITAL Performed at [facility] [address], [place], [place] [address]    Special Requests   Final    BOTTLES DRAWN AEROBIC AND ANAEROBIC Blood Culture adequate volume Performed at [facility] [address], [place], [place] [address]    Culture   Final    NO GROWTH < 24 HOURS Performed at [facility] [address] [place], [place] [address]    Report Status PENDING  Incomplete  Resp Panel by RT-PCR (Flu A&B, Covid) Nasopharyngeal Swab     Status: None   Collection Time: 07/03/21 11:16 AM   Specimen: Nasopharyngeal Swab; Nasopharyngeal(NP) swabs in vial transport medium  Result Value Ref Range Status   SARS Coronavirus 2 by RT PCR NEGATIVE NEGATIVE Final    Comment: (NOTE) SARS-CoV-2 target nucleic acids are NOT DETECTED.  The SARS-CoV-2 RNA is generally detectable in upper respiratory specimens during the acute phase of infection. The lowest concentration of SARS-CoV-2 viral copies this assay can detect is 138 copies/mL. A negative result does not preclude SARS-Cov-2 infection and should not be used as the sole basis for treatment or other patient management decisions. A negative result may occur with  improper specimen collection/handling, submission of specimen other than nasopharyngeal swab, presence of viral mutation(s) within the areas targeted by this assay, and inadequate number of  viral copies(<138 copies/mL). A negative result must be combined with clinical observations, patient history, and epidemiological information. The expected result is Negative.  Fact Sheet for Patients:  EntrepreneurPulse.com.au  Fact Sheet for Healthcare Providers:  IncredibleEmployment.be  This test is no t yet approved or cleared by the [place] FDA and  has been authorized for detection and/or diagnosis of SARS-CoV-2 by FDA under an Emergency Use Authorization (EUA). This EUA will remain  in effect (meaning this test can be used) for the duration of the COVID-19 declaration under Section 564(b)(1) of the Act, 21 U.S.C.section 360bbb-3(b)(1), unless the authorization is terminated  or revoked sooner.       Influenza A by PCR NEGATIVE NEGATIVE Final   Influenza B by PCR NEGATIVE NEGATIVE Final    Comment: (NOTE) The Xpert Xpress SARS-CoV-2/FLU/RSV plus assay is intended as an aid in the diagnosis of influenza from Nasopharyngeal swab specimens and should not be used as a sole basis for treatment. Nasal washings and aspirates are unacceptable for Xpert Xpress SARS-CoV-2/FLU/RSV testing.  Fact Sheet for Patients: EntrepreneurPulse.com.au  Fact Sheet for Healthcare Providers: IncredibleEmployment.be  This test is not yet approved or cleared by the [place] FDA and has been authorized for detection and/or diagnosis of SARS-CoV-2 by FDA under an Emergency Use Authorization (EUA). This EUA will remain in effect (meaning this test can be used) for the duration of the COVID-19 declaration under Section 564(b)(1) of the Act, 21 U.S.C. section 360bbb-3(b)(1), unless the authorization is terminated or revoked.  Performed at  [facility], [place] [address] [place], [place] [address]   Urine Culture     Status: Abnormal   Collection Time: 07/03/21 11:57 AM   Specimen: In/Out Cath  Urine  Result Value Ref Range Status   Specimen Description   Final    IN/OUT CATH URINE Performed at [facility] [address] [place], [place] [address]    Special Requests   Final    NONE Performed at [facility], [place] [address], [place], [place] [address]    Culture (A)  Final    >=100,000 COLONIES/mL LACTOBACILLUS SPECIES Standardized susceptibility testing for this organism is not available. Performed at [facility], [place] [address], [place], [place] [address]    Report Status 07/04/2021 FINAL  Final  MRSA Next Gen by PCR, Nasal     Status: None   Collection Time: 07/03/21  3:54 PM   Specimen: Nasal Mucosa; Nasal Swab  Result Value Ref Range Status   MRSA by PCR Next Gen NOT DETECTED NOT DETECTED Final    Comment: (NOTE) The GeneXpert MRSA Assay (FDA approved for NASAL specimens only), is one component of a comprehensive MRSA colonization surveillance program. It is not intended to diagnose MRSA infection nor to guide or monitor treatment for MRSA infections. Test performance is not FDA approved in patients less than 59 years old. Performed at [facility], [place] [address] [place], [place] [address]      Radiology Studies: DG Chest Portable 1 View  Result Date: 07/03/2021 CLINICAL DATA:  Altered mental status. EXAM: PORTABLE CHEST 1 VIEW COMPARISON:  06/19/2021 FINDINGS: 1132 hours. Rightward patient rotation. The lungs are clear without focal pneumonia, edema, pneumothorax or pleural effusion. The cardiopericardial silhouette is within normal limits for size. The visualized bony structures of the thorax show no acute abnormality. Telemetry leads overlie the chest. IMPRESSION: No active disease. Electronically Signed   By: Misty Stanley M.D.   On: 07/03/2021 12:11   ECHOCARDIOGRAM COMPLETE  Result Date: 07/04/2021    ECHOCARDIOGRAM REPORT   Patient Name:   KENITA BINES Hu[place]able Date of Exam: 07/04/2021  Medical Rec #:  [id number]             Height:       67.0 in Accession #:    [id number]            Weight:       122.6 lb Date of Birth:  [birth year]              BSA:          1.642 m Patient Age:    30 years              BP:           119/77 mmHg Patient Gender: F                     HR:           86 bpm. Exam Location:  Inpatient Procedure: 2D Echo, 3D Echo, Cardiac Doppler and Color Doppler Indications:    R06.02 SOB. Respiratory distress.  History:        Patient has prior history of Echocardiogram examinations, most                 recent 11/25/2019. Abnormal ECG, Aortic Valve Disease,                 Signs/Symptoms:Chest Pain,  Shortness of Breath and Dyspnea; Risk                 Factors:Diabetes and Current Smoker. Substance withdrawal.                 Moderate AI.  Sonographer:    Roseanna Rainbow RDCS Referring Phys: [phone] [facility]  Sonographer Comments: Technically difficult study due to poor echo windows. Image acquisition challenging due to uncooperative patient. Study interrrupted for meds. Patient pushed probe away in subcostals. IMPRESSIONS  1. Left ventricular ejection fraction, by estimation, is 60 to 65%. Left ventricular ejection fraction by 3D volume is 64 %. The left ventricle has normal function. The left ventricle has no regional wall motion abnormalities. Left ventricular diastolic  parameters were normal.  2. Right ventricular systolic function is normal. The right ventricular size is normal.  3. The mitral valve is normal in structure. Trivial mitral valve regurgitation. No evidence of mitral stenosis.  4. The aortic valve was not well visualized. Aortic valve regurgitation is mild. No aortic stenosis is present. FINDINGS  Left Ventricle: Left ventricular ejection fraction, by estimation, is 60 to 65%. Left ventricular ejection fraction by 3D volume is 64 %. The left ventricle has normal function. The left ventricle has no regional wall motion abnormalities. The left ventricular internal  cavity size was normal in size. There is no left ventricular hypertrophy. Left ventricular diastolic parameters were normal. Right Ventricle: The right ventricular size is normal. No increase in right ventricular wall thickness. Right ventricular systolic function is normal. The tricuspid regurgitant velocity is 2.37 m/s, and with an assumed right atrial pressure of 3 mmHg, the estimated right ventricular systolic pressure is 91.5 mmHg. Left Atrium: Left atrial size was normal in size. Right Atrium: Right atrial size was normal in size. Pericardium: There is no evidence of pericardial effusion. Mitral Valve: The mitral valve is normal in structure. Trivial mitral valve regurgitation. No evidence of mitral valve stenosis. Tricuspid Valve: The tricuspid valve is normal in structure. Tricuspid valve regurgitation is trivial. Aortic Valve: The aortic valve was not well visualized. Aortic valve regurgitation is mild. No aortic stenosis is present. Pulmonic Valve: The pulmonic valve was not well visualized. Pulmonic valve regurgitation is not visualized. Aorta: The aortic root is normal in size and structure. IAS/Shunts: The interatrial septum was not well visualized.  LEFT VENTRICLE PLAX 2D LVIDd:         4.20 cm         Diastology LVIDs:         2.80 cm         LV e' medial:    13.90 cm/s LV PW:         0.80 cm         LV E/e' medial:  6.5 LV IVS:        0.90 cm         LV e' lateral:   15.30 cm/s LVOT diam:     1.90 cm         LV E/e' lateral: 5.9 LV SV:         66 LV SV Index:   40 LVOT Area:     2.84 cm        3D Volume EF                                LV 3D EF:    Left  ventricul LV Volumes (MOD)                            ar LV vol d, MOD    77.5 ml                    ejection A2C:                                        fraction LV vol d, MOD    76.0 ml                    by 3D A4C:                                        volume is LV vol s, MOD    34.4 ml                     64 %. A2C: LV vol s, MOD    26.3 ml A4C:                           3D Volume EF: LV SV MOD A2C:   43.1 ml       3D EF:        64 % LV SV MOD A4C:   76.0 ml       LV EDV:       122 ml LV SV MOD BP:    45.5 ml       LV ESV:       44 ml                                LV SV:        78 ml RIGHT VENTRICLE RV S prime:     14.50 cm/s TAPSE (M-mode): 1.6 cm LEFT ATRIUM             Index        RIGHT ATRIUM          Index LA diam:        2.50 cm 1.52 cm/m   RA Area:     9.91 cm LA Vol (A2C):   42.5 ml 25.88 ml/m  RA Volume:   20.80 ml 12.66 ml/m LA Vol (A4C):   22.6 ml 13.76 ml/m LA Biplane Vol: 32.3 ml 19.67 ml/m  AORTIC VALVE LVOT Vmax:   136.00 cm/s LVOT Vmean:  90.800 cm/s LVOT VTI:    0.232 m  AORTA Ao Root diam: 3.40 cm MITRAL VALVE               TRICUSPID VALVE MV Area (PHT): 4.39 cm    TR Peak grad:   22.5 mmHg MV Decel Time: 173 msec    TR Vmax:        237.00 cm/s MV E velocity: 90.00 cm/s MV A velocity: 62.60 cm/s  SHUNTS MV E/A ratio:  1.44        Systemic VTI:  0.23 m  Systemic Diam: 1.90 cm Oswaldo Milian MD Electronically signed by Oswaldo Milian MD Signature Date/Time: 07/04/2021/10:26:27 AM    Final     Scheduled Meds:  Chlorhexidine Gluconate Cloth  6 each Topical Daily   clonazePAM  1 mg Oral BID   FLUoxetine  20 mg Oral BID   insulin aspart  1-3 Units Subcutaneous Q4H   insulin detemir  12 Units Subcutaneous Q12H   lamoTRIgine  25 mg Oral QHS   metoprolol tartrate  12.5 mg Oral BID   pantoprazole (PROTONIX) IV  40 mg Intravenous Q24H   sodium zirconium cyclosilicate  10 g Oral Once   Continuous Infusions:  sodium chloride Stopped (07/04/21 1051)   cefTRIAXone (ROCEPHIN)  IV     dextrose 5% lactated ringers     heparin 950 Units/hr (07/04/21 1300)   insulin Stopped (07/04/21 1226)   lactated ringers 125 mL/hr at 07/03/21 1612   promethazine (PHENERGAN) injection (IM or IVPB) Stopped (07/04/21 0924)     LOS: 0 days   Marylu Lund,  MD [facility] Pager On Amion  If 7PM-7AM, please contact night-coverage 07/04/2021, 1:25 PM

## 2021-07-04 NOTE — Progress Notes (Addendum)
eLink Physician-Brief Progress Note Patient Name: Madison Williams DOB: 04/06/1986 MRN: [id number]   Date of Service  07/04/2021  HPI/Events of Note  Endo tool asking for ssi.  Nausea also.   eICU Interventions  Zofran IV once. BMP stat, if co2 over 20 and gap closing to switch.  Asp precautions.      Intervention Category Minor Interventions: Other: (nausea and DKA)  Ranee Gosselin 07/04/2021, 2:33 AM  3:49 AM labs seen. AG 15, co2 > 20. Has nausea. Not able to eat.  Type 1 DM. Takes 25 Units of lantus and 9 unist AC meals.  Would not shift to ssi/long acting now, would defer to AM, once nausea subsides and able to take liquids/diet.   Discussed with RN.

## 2021-07-04 NOTE — Consult Note (Addendum)
Cardiology Consultation:   Patient ID: Madison Williams MRN: [id number]; DOB: 1985/08/07  Admit date: 07/03/2021 Date of Consult: 07/04/2021  PCP:  Madison Register, MD   [facility] Providers Cardiologist:  Madison Carrow, MD        Patient Profile:   Madison Williams is a 35 y.o. female with a hx of DM1, HTN, HLD, RA, asthma, gastroparesis, hx DKA admits, STEMI 11/24/2019  w/ trop > 27,000 in setting of DKA/AKI felt 2nd spasm or resolved thrombus c/b RP hematoma, hx abuse kratom and ETOH, who is being seen 07/04/2021 for the evaluation of elevated troponin and chest pain at the request of Dr Madison Williams.  History of Present Illness:   [name] did not f/u w/ Cards after her 10/2019 admission.  Admitted 12/04 w/ DKA, encephalopathic, hypotensive, pH 6.89, creatinine 4.19, BUN 71, Gluc > 700, K+ 6.7, Na 124, trop peak 1,441 >> Cards asked to see.   [name] gets chest pain on a regular basis.  She says it happens once every couple of weeks.  She will take a warm bath and thinks that might help a little bit.  It reaches a 6 or 7/10.  She does not know what brings it on.  She cannot tell me much about it.  She started having the chest pain prior to admission and it was going on for several days prior to her coming in.  She is not currently having chest pain right now.  She does not think it gets worse with deep inspiration.  She does not remember getting anything that made it better.  She has not been short of breath.  In the nursing notes, she reported some chest pain just after midnight, her ECG did not show a STEMI.  The troponin has peaked and is starting to trend back down.  She admits that sometimes she misses her medications.  She lives with her daughter and grandmother.  Although she appears generally uncomfortable, she does not report any acute chest pain or shortness of breath at this time.  She says she has not been drinking, but is still on the Kratom.     Past Medical History:  Diagnosis Date   Asthma    Diabetes mellitus without complication (HCC)    Rheumatoid arthritis (HCC)     Past Surgical History:  Procedure Laterality Date   LAPAROSCOPIC OVARIAN     LEFT HEART CATH AND CORONARY ANGIOGRAPHY N/A 11/24/2019   Procedure: LEFT HEART CATH AND CORONARY ANGIOGRAPHY;  Surgeon: Kathleene Hazel, MD;  Location: [facility] INVASIVE CV LAB;  Service: Cardiovascular;  Laterality: N/A;     Home Medications:  Prior to Admission medications   Medication Sig Start Date End Date Taking? Authorizing Provider  clonazePAM (KLONOPIN) 1 MG tablet Take 1 mg by mouth 2 (two) times daily as needed for anxiety. 06/14/21  Yes [provider]  FLUoxetine (PROZAC) 20 MG capsule Take 20 mg by mouth 2 (two) times daily. 12/29/20  Yes [provider]  gabapentin (NEURONTIN) 300 MG capsule Take 300-600 mg by mouth 2 (two) times daily as needed (pain). 12/29/20  Yes [provider]  insulin lispro (HUMALOG) 100 UNIT/ML injection Inject 0-9 units into the skin three times daily with meals by sliding scale 03/07/21  Yes Newlin, Odette Horns, MD  lamoTRIgine (LAMICTAL) 25 MG tablet Take 50 mg by mouth at bedtime. 06/13/21  Yes [provider]  metoprolol succinate (TOPROL-XL) 25 MG 24 hr tablet Take 25 mg by mouth  daily. 06/14/21  Yes [provider]  promethazine (PHENERGAN) 25 MG tablet Take 1 tablet (25 mg total) by mouth every 6 (six) hours as needed for nausea or vomiting. 01/18/21  Yes Tyrone Nine, MD  benzonatate (TESSALON) 100 MG capsule Take 1 capsule (100 mg total) by mouth 2 (two) times daily as needed for cough. Patient not taking: Reported on 07/03/2021 04/29/21   Claiborne Rigg, NP  ibuprofen (ADVIL) 200 MG tablet Take 200 mg by mouth every 6 (six) hours as needed for moderate pain.    [provider]  insulin glargine (LANTUS) 100 UNIT/ML injection Inject 0.25 mLs (25 Units total) into the skin daily. 03/14/21  06/27/21  Madison Register, MD  Insulin Syringes, Disposable, U-100 0.5 ML MISC 1 each by Does not apply route 4 (four) times daily - after meals and at bedtime. 02/05/20   Anders Simmonds, PA-C  metoCLOPramide (REGLAN) 5 MG tablet TAKE 1 TABLET (5 MG TOTAL) BY MOUTH 3 (THREE) TIMES DAILY BEFORE MEALS. Patient not taking: Reported on 03/14/2021 07/07/20 07/07/21  Madison Register, MD    Inpatient Medications: Scheduled Meds:  Chlorhexidine Gluconate Cloth  6 each Topical Daily   clonazePAM  1 mg Oral BID   FLUoxetine  20 mg Oral BID   insulin aspart  1-3 Units Subcutaneous Q4H   insulin detemir  12 Units Subcutaneous Q12H   lamoTRIgine  25 mg Oral QHS   metoprolol tartrate  12.5 mg Oral BID   pantoprazole (PROTONIX) IV  40 mg Intravenous Q24H   sodium zirconium cyclosilicate  10 g Oral Once   Continuous Infusions:  sodium chloride Stopped (07/04/21 0833)   ceFEPime (MAXIPIME) IV 200 mL/hr at 07/04/21 0900   dextrose 5% lactated ringers     dextrose 5 % and 0.45% NaCl 125 mL/hr at 07/04/21 0900   heparin 950 Units/hr (07/04/21 0900)   insulin 2.2 Units/hr (07/04/21 0900)   lactated ringers 125 mL/hr at 07/03/21 1612   metronidazole Stopped (07/04/21 0833)   promethazine (PHENERGAN) injection (IM or IVPB) 25 mg (07/04/21 0909)   [START ON 07/05/2021] vancomycin     PRN Meds: sodium chloride, acetaminophen **OR** acetaminophen, dextrose, nitroGLYCERIN, promethazine (PHENERGAN) injection (IM or IVPB) **OR** promethazine  Allergies:   No Known Allergies  Social History:   Social History   Socioeconomic History   Marital status: Single    Spouse name: Not on file   Number of children: 1   Years of education: Not on file   Highest education level: Not on file  Occupational History   Not on file  Tobacco Use   Smoking status: Former    Packs/day: 1.00    Types: Cigarettes   Smokeless tobacco: Never  Vaping Use   Vaping Use: Every day   Substances: Nicotine  Substance and  Sexual Activity   Alcohol use: Not Currently   Drug use: Yes    Comment: Kratum   Sexual activity: Not on file  Other Topics Concern   Not on file  Social History Narrative   Not on file   Social Determinants of Health   Financial Resource Strain: Not on file  Food Insecurity: Not on file  Transportation Needs: Not on file  Physical Activity: Not on file  Stress: Not on file  Social Connections: Not on file  Intimate Partner Violence: Not on file    Family History:    Family History  Family history unknown: Yes     ROS:  Please see  the history of present illness.  All other ROS reviewed and negative.     Physical Exam/Data:   Vitals:   07/04/21 0300 07/04/21 0400 07/04/21 0500 07/04/21 0816  BP: 124/74 131/75 119/77 120/74  Pulse: (!) 107 (!) 102 95 90  Resp: (!) 21 (!) 23 (!) 24 20  Temp:  (!) 100.4 F (38 C)  (!) 100.6 F (38.1 C)  TempSrc:  Oral  Oral  SpO2: 98% 99% 91% 99%  Weight:      Height:        Intake/Output Summary (Last 24 hours) at 07/04/2021 1038 Last data filed at 07/04/2021 0900 Gross per 24 hour  Intake 3809.36 ml  Output 500 ml  Net 3309.36 ml   Last 3 Weights 07/03/2021 04/29/2021 03/14/2021  Weight (lbs) 122 lb 9.2 oz 125 lb 125 lb 9.6 oz  Weight (kg) 55.6 kg 56.7 kg 56.972 kg     Body mass index is 19.2 kg/m.  General:  Well nourished, well developed, in mild-moderate distress HEENT: normal Neck: no JVD Vascular: No carotid bruits; Distal pulses 2+ bilaterally Cardiac:  normal S1, S2; RRR; no murmur, no rub Lungs:  clear to auscultation bilaterally, no wheezing, rhonchi or rales  Abd: soft, nontender, no hepatomegaly  Ext: no edema Musculoskeletal:  No deformities, BUE and BLE strength weak but equal Skin: warm and dry  Neuro:  CNs 2-12 intact, no focal abnormalities noted Psych:  Normal affect   EKG:  The EKG was personally reviewed and demonstrates:   12/04 initial ECG is sinus rhythm with biatrial enlargement and high peak T  waves, heart rate 75 12/04-second EKG is sinus tach, heart rate 113 but peaked T waves and large P waves have resolved 12/04 22:20 ECG is sinus rhythm, T wave inversions in V1 are deeper and she has possible abnormal ST segments in some of the anterior leads Telemetry:  Telemetry was personally reviewed and demonstrates: Sinus rhythm, sinus tachycardia  Relevant CV Studies:  ECHO: 07/04/2021   1. Left ventricular ejection fraction, by estimation, is 60 to 65%. Left  ventricular ejection fraction by 3D volume is 64 %. The left ventricle has  normal function. The left ventricle has no regional wall motion  abnormalities. Left ventricular diastolic   parameters were normal.   2. Right ventricular systolic function is normal. The right ventricular  size is normal.   3. The mitral valve is normal in structure. Trivial mitral valve  regurgitation. No evidence of mitral stenosis.   4. The aortic valve was not well visualized. Aortic valve regurgitation  is mild. No aortic stenosis is present.   ECHO: 11/25/2019  1. Definity used; inferolateral and apical hypokinesis with overall mild LV dysfunction; grade 1 diastolic dysfunction; moderate AI.   2. Left ventricular ejection fraction, by estimation, is 45 to 50%. The  left ventricle has mildly decreased function. The left ventricle  demonstrates regional wall motion abnormalities (see scoring  diagram/findings for description). Left ventricular  diastolic parameters are consistent with Grade I diastolic dysfunction  (impaired relaxation).   3. Right ventricular systolic function is normal. The right ventricular  size is normal.   4. The mitral valve is normal in structure. Trivial mitral valve  regurgitation. No evidence of mitral stenosis.   5. The aortic valve is tricuspid. Aortic valve regurgitation is moderate.  No aortic stenosis is present.   6. The inferior vena cava is dilated in size with >50% respiratory  variability, suggesting  right atrial pressure of 8  mmHg.   CARDIA CATH: 11/24/2019 There is mild to moderate left ventricular systolic dysfunction. LV end diastolic pressure is normal. The left ventricular ejection fraction is 35-45% by visual estimate. There is no mitral valve regurgitation.   1. No angiographic evidence of CAD 2. Moderate segmental LV systolic dysfunction   It is not entirely clear what is causing her elevated troponin and chest pain. No evidence of coronary dissection. Excellent flow down all vessels with no atherosclerotic disease. This could represent a stress induced cardiomyopathy but generally we would not see a troponin level over 27,000 in the setting of stress induced cardiomyopathy. Possible coronary spasm or resolved thrombus. Also cannot exclude PE.  Admit to the ICU under PCCM service for continued management of DKA.   Laboratory Data:  High Sensitivity Troponin:   Recent Labs  Lab 07/03/21 1448 07/03/21 2018 07/04/21 0027 07/04/21 0243 07/04/21 0521  TROPONINIHS 271* 815* 1,371* 1,441* 1,278*     Chemistry Recent Labs  Lab 07/03/21 1448 07/03/21 1812 07/03/21 2147 07/04/21 0243  NA  --  140 144 144  145  K  --  4.0 3.6 3.6  3.6  CL  --  102 107 109  109  CO2  --  11* 17* 20*  21*  GLUCOSE  --  518* 241* 178*  179*  BUN  --  53* 51* 48*  48*  CREATININE  --  3.42* 2.60* 2.26*  2.29*  CALCIUM  --  7.3* 7.5* 7.4*  7.3*  MG 3.1*  --   --  2.0  GFRNONAA  --  17* 24* 28*  28*  ANIONGAP  --  27* 20* 15  15    Recent Labs  Lab 07/03/21 1026 07/04/21 0243  PROT 6.8 6.2*  ALBUMIN 4.0 3.4*  AST 28 37  ALT 40 36  ALKPHOS 137* 96  BILITOT 2.5* 0.9   Lipids No results for input(s): CHOL, TRIG, HDL, LABVLDL, LDLCALC, CHOLHDL in the last 168 hours.  Hematology Recent Labs  Lab 07/03/21 1025 07/03/21 1050 07/04/21 0243  WBC 24.3*  --  10.6*  RBC 3.84*  --  3.45*  HGB 11.8* 13.9 10.5*  HCT 43.2 41.0 30.2*  MCV 112.5*  --  87.5  MCH 30.7  --  30.4   MCHC 27.3*  --  34.8  RDW 15.0  --  14.6  PLT 304  --  166   Thyroid No results for input(s): TSH, FREET4 in the last 168 hours.  BNPNo results for input(s): BNP, PROBNP in the last 168 hours.  DDimer  Recent Labs  Lab 07/03/21 2234  DDIMER 1.92*     Radiology/Studies:  DG Chest Portable 1 View  Result Date: 07/03/2021 CLINICAL DATA:  Altered mental status. EXAM: PORTABLE CHEST 1 VIEW COMPARISON:  06/19/2021 FINDINGS: 1132 hours. Rightward patient rotation. The lungs are clear without focal pneumonia, edema, pneumothorax or pleural effusion. The cardiopericardial silhouette is within normal limits for size. The visualized bony structures of the thorax show no acute abnormality. Telemetry leads overlie the chest. IMPRESSION: No active disease. Electronically Signed   By: Kennith Center M.D.   On: 07/03/2021 12:11   ECHOCARDIOGRAM COMPLETE  Result Date: 07/04/2021    ECHOCARDIOGRAM REPORT   Patient Name:   HINA GUPTA Date of Exam: 07/04/2021 Medical Rec #:  [id number]             Height:       67.0 in Accession #:    [id number]  Weight:       122.6 lb Date of Birth:  [birth year]              BSA:          1.642 m Patient Age:    35 years              BP:           119/77 mmHg Patient Gender: F                     HR:           86 bpm. Exam Location:  Inpatient Procedure: 2D Echo, 3D Echo, Cardiac Doppler and Color Doppler Indications:    R06.02 SOB. Respiratory distress.  History:        Patient has prior history of Echocardiogram examinations, most                 recent 11/25/2019. Abnormal ECG, Aortic Valve Disease,                 Signs/Symptoms:Chest Pain, Shortness of Breath and Dyspnea; Risk                 Factors:Diabetes and Current Smoker. Substance withdrawal.                 Moderate AI.  Sonographer:    Sheralyn Boatman RDCS Referring Phys: [phone] [facility]  Sonographer Comments: Technically difficult study due to poor echo windows. Image acquisition challenging due to  uncooperative patient. Study interrrupted for meds. Patient pushed probe away in subcostals. IMPRESSIONS  1. Left ventricular ejection fraction, by estimation, is 60 to 65%. Left ventricular ejection fraction by 3D volume is 64 %. The left ventricle has normal function. The left ventricle has no regional wall motion abnormalities. Left ventricular diastolic  parameters were normal.  2. Right ventricular systolic function is normal. The right ventricular size is normal.  3. The mitral valve is normal in structure. Trivial mitral valve regurgitation. No evidence of mitral stenosis.  4. The aortic valve was not well visualized. Aortic valve regurgitation is mild. No aortic stenosis is present. FINDINGS  Left Ventricle: Left ventricular ejection fraction, by estimation, is 60 to 65%. Left ventricular ejection fraction by 3D volume is 64 %. The left ventricle has normal function. The left ventricle has no regional wall motion abnormalities. The left ventricular internal cavity size was normal in size. There is no left ventricular hypertrophy. Left ventricular diastolic parameters were normal. Right Ventricle: The right ventricular size is normal. No increase in right ventricular wall thickness. Right ventricular systolic function is normal. The tricuspid regurgitant velocity is 2.37 m/s, and with an assumed right atrial pressure of 3 mmHg, the estimated right ventricular systolic pressure is 25.5 mmHg. Left Atrium: Left atrial size was normal in size. Right Atrium: Right atrial size was normal in size. Pericardium: There is no evidence of pericardial effusion. Mitral Valve: The mitral valve is normal in structure. Trivial mitral valve regurgitation. No evidence of mitral valve stenosis. Tricuspid Valve: The tricuspid valve is normal in structure. Tricuspid valve regurgitation is trivial. Aortic Valve: The aortic valve was not well visualized. Aortic valve regurgitation is mild. No aortic stenosis is present. Pulmonic  Valve: The pulmonic valve was not well visualized. Pulmonic valve regurgitation is not visualized. Aorta: The aortic root is normal in size and structure. IAS/Shunts: The interatrial septum was not well visualized.  LEFT VENTRICLE PLAX 2D LVIDd:  4.20 cm         Diastology LVIDs:         2.80 cm         LV e' medial:    13.90 cm/s LV PW:         0.80 cm         LV E/e' medial:  6.5 LV IVS:        0.90 cm         LV e' lateral:   15.30 cm/s LVOT diam:     1.90 cm         LV E/e' lateral: 5.9 LV SV:         66 LV SV Index:   40 LVOT Area:     2.84 cm        3D Volume EF                                LV 3D EF:    Left                                             ventricul LV Volumes (MOD)                            ar LV vol d, MOD    77.5 ml                    ejection A2C:                                        fraction LV vol d, MOD    76.0 ml                    by 3D A4C:                                        volume is LV vol s, MOD    34.4 ml                    64 %. A2C: LV vol s, MOD    26.3 ml A4C:                           3D Volume EF: LV SV MOD A2C:   43.1 ml       3D EF:        64 % LV SV MOD A4C:   76.0 ml       LV EDV:       122 ml LV SV MOD BP:    45.5 ml       LV ESV:       44 ml                                LV SV:        78 ml RIGHT VENTRICLE RV S prime:     14.50 cm/s TAPSE (M-mode): 1.6  cm LEFT ATRIUM             Index        RIGHT ATRIUM          Index LA diam:        2.50 cm 1.52 cm/m   RA Area:     9.91 cm LA Vol (A2C):   42.5 ml 25.88 ml/m  RA Volume:   20.80 ml 12.66 ml/m LA Vol (A4C):   22.6 ml 13.76 ml/m LA Biplane Vol: 32.3 ml 19.67 ml/m  AORTIC VALVE LVOT Vmax:   136.00 cm/s LVOT Vmean:  90.800 cm/s LVOT VTI:    0.232 m  AORTA Ao Root diam: 3.40 cm MITRAL VALVE               TRICUSPID VALVE MV Area (PHT): 4.39 cm    TR Peak grad:   22.5 mmHg MV Decel Time: 173 msec    TR Vmax:        237.00 cm/s MV E velocity: 90.00 cm/s MV A velocity: 62.60 cm/s  SHUNTS MV E/A ratio:  1.44         Systemic VTI:  0.23 m                            Systemic Diam: 1.90 cm Epifanio Lesches MD Electronically signed by Epifanio Lesches MD Signature Date/Time: 07/04/2021/10:26:27 AM    Final      Assessment and Plan:   Non-STEMI -She has been having pain - At the time of her STEMI in 2021, her troponin was greater than 27,000, but coronaries were clear - Was unclear at the time if this might have been related to a resolved thrombus or spasm - She did not follow-up with cardiology but has continued to experience episodes of chest pain, worse just before admission - She is not consistently compliant with medications - Discuss with MD if we should try adding Imdur, her blood pressure has improved from admission and she might tolerate a low-dose -Prior to admission she was on Toprol-XL 25 mg a day, this has been restarted as Lopressor at 12.5 mg twice daily -Echo has just been resulted, EF is normal with no wall motion abnormalities and normal RV function -Demand ischemia could also cause some enzyme elevation, but this is going to be difficult to sort out - Discuss further work-up with MD  2.  DKA with metabolic encephalopathy -Her electrolytes have improved with hydration and medications, but her calcium is trending down - Will leave supplementation of this to attending MD  Risk Assessment/Risk Scores:     TIMI Risk Score for Unstable Angina or Non-ST Elevation MI:   The patient's TIMI risk score is 3, which indicates a 13% risk of all cause mortality, new or recurrent myocardial infarction or need for urgent revascularization in the next 14 days.  For questions or updates, please contact [facility] Please consult [email or web address] for contact info under    Signed, Theodore Demark, PA-C  07/04/2021 10:38 AM

## 2021-07-04 NOTE — Progress Notes (Addendum)
eLink Physician-Brief Progress Note Patient Name: Madison Williams DOB: 1985-09-18 MRN: [id number]   Date of Service  07/04/2021  HPI/Events of Note  Trop's bumped up to 1,371,   RN reports that pt had CP earlier and ECG was done, didn't see any alerts on ECG but ECG isn't crossing over,   CP free now.  She can swallow now, able to talk to RN.   eICU Interventions  Rx as NSTEMI. Elevated d dimer also. Preg neg. Statin/heparin gtt. Watch for low platelets or Hg while on it.   CTA chest not done due to AKI. V/Q scan ordered.      Intervention Category Intermediate Interventions: Diagnostic test evaluation  Ranee Gosselin 07/04/2021, 1:27 AM

## 2021-07-04 NOTE — Progress Notes (Signed)
ANTICOAGULATION CONSULT NOTE - Follow Up Consult  Pharmacy Consult for heparin Indication: elevated troponin  No Known Allergies  Patient Measurements: Height: 5\' 7"  (170.2 cm) Weight: 55.6 kg (122 lb 9.2 oz) IBW/kg (Calculated) : 61.6 Heparin Dosing Weight: 56 kg  Vital Signs: Temp: 100.4 F (38 C) (12/05 0400) Temp Source: Oral (12/05 0400) BP: 119/77 (12/05 0500) Pulse Rate: 95 (12/05 0500)  Labs: Recent Labs    07/03/21 1025 07/03/21 1026 07/03/21 1050 07/03/21 1400 07/03/21 1812 07/03/21 2018 07/03/21 2147 07/04/21 0027 07/04/21 0243 07/04/21 0521  HGB 11.8*  --  13.9  --   --   --   --   --  10.5*  --   HCT 43.2  --  41.0  --   --   --   --   --  30.2*  --   PLT 304  --   --   --   --   --   --   --  166  --   APTT  --   --   --   --   --   --   --   --  36  --   LABPROT  --   --   --   --   --   --   --   --  14.0  --   INR  --   --   --   --   --   --   --   --  1.1  --   CREATININE  --    < > 4.20*   < > 3.42*  --  2.60*  --  2.26*  2.29*  --   TROPONINIHS  --   --   --    < >  --    < >  --  1,371* 1,441* 1,278*   < > = values in this interval not displayed.    Estimated Creatinine Clearance: 30.4 mL/min (A) (by C-G formula based on SCr of 2.29 mg/dL (H)).   Assessment: Patient is a 35 y.o F with hx DM presented to the ED on 07/03/21 with AMS and was found to have hyperglycemia.  She's currently on heparin drip for elevated troponin.  Today, 07/04/2021: -  first heparin level is therapeutic at 0.31 - toponin 1278 - hgb down 10.5 and plts 166K (on LR at 125 ml/hr)   Goal of Therapy:  Heparin level 0.3-0.7 units/ml Monitor platelets by anticoagulation protocol: Yes   Plan:  -continue heparin drip at 950 units/hr - check another heparin level at 4pm to ensure level is still at goal before changing to daily monitoring - monitor for s/sx bleeding - f/u recom from cardiology team  14/11/2020 P 07/04/2021,7:22 AM

## 2021-07-05 DIAGNOSIS — E1011 Type 1 diabetes mellitus with ketoacidosis with coma: Secondary | ICD-10-CM | POA: Diagnosis not present

## 2021-07-05 DIAGNOSIS — E0811 Diabetes mellitus due to underlying condition with ketoacidosis with coma: Secondary | ICD-10-CM

## 2021-07-05 DIAGNOSIS — R778 Other specified abnormalities of plasma proteins: Secondary | ICD-10-CM | POA: Diagnosis not present

## 2021-07-05 DIAGNOSIS — N179 Acute kidney failure, unspecified: Secondary | ICD-10-CM | POA: Diagnosis not present

## 2021-07-05 LAB — CBC
HCT: 30.1 % — ABNORMAL LOW (ref 36.0–46.0)
Hemoglobin: 10.1 g/dL — ABNORMAL LOW (ref 12.0–15.0)
MCH: 30 pg (ref 26.0–34.0)
MCHC: 33.6 g/dL (ref 30.0–36.0)
MCV: 89.3 fL (ref 80.0–100.0)
Platelets: 132 10*3/uL — ABNORMAL LOW (ref 150–400)
RBC: 3.37 MIL/uL — ABNORMAL LOW (ref 3.87–5.11)
RDW: 15.9 % — ABNORMAL HIGH (ref 11.5–15.5)
WBC: 6.3 10*3/uL (ref 4.0–10.5)
nRBC: 0 % (ref 0.0–0.2)

## 2021-07-05 LAB — GLUCOSE, CAPILLARY
Glucose-Capillary: 124 mg/dL — ABNORMAL HIGH (ref 70–99)
Glucose-Capillary: 138 mg/dL — ABNORMAL HIGH (ref 70–99)
Glucose-Capillary: 262 mg/dL — ABNORMAL HIGH (ref 70–99)
Glucose-Capillary: 378 mg/dL — ABNORMAL HIGH (ref 70–99)
Glucose-Capillary: 57 mg/dL — ABNORMAL LOW (ref 70–99)
Glucose-Capillary: 66 mg/dL — ABNORMAL LOW (ref 70–99)
Glucose-Capillary: 74 mg/dL (ref 70–99)
Glucose-Capillary: 95 mg/dL (ref 70–99)
Glucose-Capillary: 96 mg/dL (ref 70–99)

## 2021-07-05 LAB — PROCALCITONIN: Procalcitonin: 0.67 ng/mL

## 2021-07-05 LAB — SEDIMENTATION RATE: Sed Rate: 7 mm/hr (ref 0–22)

## 2021-07-05 LAB — COMPREHENSIVE METABOLIC PANEL
ALT: 35 U/L (ref 0–44)
AST: 78 U/L — ABNORMAL HIGH (ref 15–41)
Albumin: 3.2 g/dL — ABNORMAL LOW (ref 3.5–5.0)
Alkaline Phosphatase: 73 U/L (ref 38–126)
Anion gap: 10 (ref 5–15)
BUN: 30 mg/dL — ABNORMAL HIGH (ref 6–20)
CO2: 23 mmol/L (ref 22–32)
Calcium: 7.5 mg/dL — ABNORMAL LOW (ref 8.9–10.3)
Chloride: 114 mmol/L — ABNORMAL HIGH (ref 98–111)
Creatinine, Ser: 0.98 mg/dL (ref 0.44–1.00)
GFR, Estimated: >60 mL/min (ref >60)
Glucose, Bld: 83 mg/dL (ref 70–99)
Potassium: 3.1 mmol/L — ABNORMAL LOW (ref 3.5–5.1)
Sodium: 147 mmol/L — ABNORMAL HIGH (ref 135–145)
Total Bilirubin: 0.7 mg/dL (ref 0.3–1.2)
Total Protein: 5.8 g/dL — ABNORMAL LOW (ref 6.5–8.1)

## 2021-07-05 LAB — RHEUMATOID FACTOR: Rheumatoid fact SerPl-aCnc: 10.4 IU/mL (ref <14.0)

## 2021-07-05 LAB — CYCLIC CITRUL PEPTIDE ANTIBODY, IGG/IGA: CCP Antibodies IgG/IgA: 4 units (ref 0–19)

## 2021-07-05 LAB — HEPARIN LEVEL (UNFRACTIONATED): Heparin Unfractionated: 0.42 IU/mL (ref 0.30–0.70)

## 2021-07-05 MED ORDER — INSULIN DETEMIR 100 UNIT/ML ~~LOC~~ SOLN
12.0000 [IU] | Freq: Two times a day (BID) | SUBCUTANEOUS | Status: DC
  Administered 2021-07-06 – 2021-07-07 (×3): 12 [IU] via SUBCUTANEOUS
  Filled 2021-07-05 (×5): qty 0.12

## 2021-07-05 MED ORDER — METHOCARBAMOL 1000 MG/10ML IJ SOLN
500.0000 mg | Freq: Three times a day (TID) | INTRAVENOUS | Status: DC | PRN
  Administered 2021-07-05: 500 mg via INTRAVENOUS
  Filled 2021-07-05: qty 500

## 2021-07-05 MED ORDER — METOCLOPRAMIDE HCL 5 MG/ML IJ SOLN
5.0000 mg | Freq: Once | INTRAMUSCULAR | Status: AC
Start: 2021-07-05 — End: 2021-07-05
  Administered 2021-07-05: 5 mg via INTRAVENOUS
  Filled 2021-07-05: qty 2

## 2021-07-05 MED ORDER — MORPHINE SULFATE (PF) 2 MG/ML IV SOLN
1.0000 mg | Freq: Once | INTRAVENOUS | Status: AC
  Administered 2021-07-05: 1 mg via INTRAVENOUS
  Filled 2021-07-05: qty 1

## 2021-07-05 MED ORDER — POTASSIUM CHLORIDE CRYS ER 20 MEQ PO TBCR: 40.0000 meq | EXTENDED_RELEASE_TABLET | Freq: Once | ORAL | Status: DC

## 2021-07-05 MED ORDER — INFLUENZA VAC SPLIT QUAD 0.5 ML IM SUSY
0.5000 mL | PREFILLED_SYRINGE | INTRAMUSCULAR | Status: AC
  Administered 2021-07-07: 0.5 mL via INTRAMUSCULAR
  Filled 2021-07-05: qty 0.5

## 2021-07-05 MED ORDER — MORPHINE SULFATE (PF) 2 MG/ML IV SOLN
1.0000 mg | Freq: Once | INTRAVENOUS | Status: AC | PRN
  Administered 2021-07-05: 1 mg via INTRAVENOUS
  Filled 2021-07-05: qty 1

## 2021-07-05 MED ORDER — POTASSIUM CHLORIDE 10 MEQ/100ML IV SOLN
INTRAVENOUS | Status: AC
  Filled 2021-07-05: qty 100

## 2021-07-05 MED ORDER — INSULIN ASPART 100 UNIT/ML IJ SOLN
0.0000 [IU] | Freq: Three times a day (TID) | INTRAMUSCULAR | Status: DC
  Administered 2021-07-05: 15 [IU] via SUBCUTANEOUS
  Administered 2021-07-06: 5 [IU] via SUBCUTANEOUS

## 2021-07-05 MED ORDER — LOSARTAN POTASSIUM 50 MG PO TABS: 25.0000 mg | ORAL_TABLET | Freq: Every day | ORAL | Status: DC

## 2021-07-05 MED ORDER — ASPIRIN 81 MG PO CHEW
81.0000 mg | CHEWABLE_TABLET | Freq: Every day | ORAL | Status: DC
  Administered 2021-07-05 – 2021-07-07 (×3): 81 mg via ORAL
  Filled 2021-07-05 (×4): qty 1

## 2021-07-05 MED ORDER — INSULIN ASPART 100 UNIT/ML IJ SOLN: 0.0000 [IU] | Freq: Every day | INTRAMUSCULAR | Status: DC

## 2021-07-05 MED ORDER — POTASSIUM CHLORIDE 10 MEQ/100ML IV SOLN
INTRAVENOUS | Status: AC
  Administered 2021-07-05: 10 meq via INTRAVENOUS
  Filled 2021-07-05: qty 100

## 2021-07-05 MED ORDER — KETOROLAC TROMETHAMINE 30 MG/ML IJ SOLN
30.0000 mg | Freq: Four times a day (QID) | INTRAMUSCULAR | Status: DC | PRN
  Administered 2021-07-05 – 2021-07-06 (×2): 30 mg via INTRAVENOUS
  Filled 2021-07-05 (×2): qty 1

## 2021-07-05 MED ORDER — AMLODIPINE BESYLATE 5 MG PO TABS
2.5000 mg | ORAL_TABLET | Freq: Every day | ORAL | Status: DC
  Administered 2021-07-05 – 2021-07-07 (×3): 2.5 mg via ORAL
  Filled 2021-07-05 (×3): qty 1

## 2021-07-05 MED ORDER — ENOXAPARIN SODIUM 40 MG/0.4ML IJ SOSY
40.0000 mg | PREFILLED_SYRINGE | INTRAMUSCULAR | Status: DC
  Administered 2021-07-05 – 2021-07-06 (×2): 40 mg via SUBCUTANEOUS
  Filled 2021-07-05 (×2): qty 0.4

## 2021-07-05 MED ORDER — POTASSIUM CHLORIDE 10 MEQ/100ML IV SOLN
10.0000 meq | INTRAVENOUS | Status: AC
  Administered 2021-07-05 (×4): 10 meq via INTRAVENOUS
  Filled 2021-07-05 (×4): qty 100

## 2021-07-05 MED ORDER — POTASSIUM CHLORIDE 10 MEQ/100ML IV SOLN
10.0000 meq | INTRAVENOUS | Status: AC
  Administered 2021-07-05 (×3): 10 meq via INTRAVENOUS

## 2021-07-05 NOTE — Progress Notes (Signed)
Progress Note  Patient Name: Madison Williams Date of Encounter: 07/05/2021  [facility] Cardiologist: Verne Carrow, MD   Subjective   Sleepy, had phenergan this am for nausea and bilious vomiting No more chest pain  Inpatient Medications    Scheduled Meds:  Chlorhexidine Gluconate Cloth  6 each Topical Daily   clonazePAM  1 mg Oral BID   FLUoxetine  20 mg Oral BID   insulin aspart  1-3 Units Subcutaneous Q4H   insulin detemir  12 Units Subcutaneous Q12H   lamoTRIgine  25 mg Oral QHS   metoprolol tartrate  25 mg Oral BID   pantoprazole (PROTONIX) IV  40 mg Intravenous Q24H   sodium zirconium cyclosilicate  10 g Oral Once   Continuous Infusions:  sodium chloride Stopped (07/05/21 0544)   cefTRIAXone (ROCEPHIN)  IV Stopped (07/04/21 2205)   heparin 950 Units/hr (07/05/21 0600)   potassium chloride 10 mEq (07/05/21 0802)   promethazine (PHENERGAN) injection (IM or IVPB) Stopped (07/05/21 0101)   PRN Meds: sodium chloride, acetaminophen **OR** acetaminophen, clonazePAM, dextrose, ketorolac, promethazine (PHENERGAN) injection (IM or IVPB) **OR** promethazine   Vital Signs    Vitals:   07/05/21 0600 07/05/21 0630 07/05/21 0700 07/05/21 0800  BP: (!) 119/94 (!) 127/100 (!) 118/94 (!) 136/107  Pulse: 70 69 73 76  Resp: (!) 30 (!) 29 (!) 24 17  Temp:      TempSrc:      SpO2: 99% 98% 97% 92%  Weight:      Height:        Intake/Output Summary (Last 24 hours) at 07/05/2021 0808 Last data filed at 07/05/2021 0759 Gross per 24 hour  Intake 1602.89 ml  Output 200 ml  Net 1402.89 ml   Last 3 Weights 07/03/2021 04/29/2021 03/14/2021  Weight (lbs) 122 lb 9.2 oz 125 lb 125 lb 9.6 oz  Weight (kg) 55.6 kg 56.7 kg 56.972 kg      Telemetry    SR, short run of NSVT but it appeared slightly irregular, MD to review - Personally Reviewed  ECG    None today - Personally Reviewed  Physical Exam   GEN: No acute distress.   Neck: No JVD Cardiac: RRR, no  murmurs, rubs, or gallops.  Respiratory: Clear to auscultation bilaterally. GI: Soft, nontender, non-distended  MS: No edema; No deformity. Neuro:  Nonfocal  Psych: sleepy affect   Labs    High Sensitivity Troponin:   Recent Labs  Lab 07/03/21 2018 07/04/21 0027 07/04/21 0243 07/04/21 0521 07/04/21 0930  TROPONINIHS 815* 1,371* 1,441* 1,278* 1,445*     Chemistry Recent Labs  Lab 07/03/21 1026 07/03/21 1050 07/03/21 1448 07/03/21 1812 07/03/21 2147 07/04/21 0243 07/05/21 0254  NA 123*   < >  --    < > 144 144  145 147*  K 6.9*   < >  --    < > 3.6 3.6  3.6 3.1*  CL 80*   < >  --    < > 107 109  109 114*  CO2 <7*   < >  --    < > 17* 20*  21* 23  GLUCOSE 1,498*   < >  --    < > 241* 178*  179* 83  BUN 59*   < >  --    < > 51* 48*  48* 30*  CREATININE 4.70*   < >  --    < > 2.60* 2.26*  2.29* 0.98  CALCIUM 7.1*   < >  --    < >  7.5* 7.4*  7.3* 7.5*  MG  --   --  3.1*  --   --  2.0  --   PROT 6.8  --   --   --   --  6.2* 5.8*  ALBUMIN 4.0  --   --   --   --  3.4* 3.2*  AST 28  --   --   --   --  37 78*  ALT 40  --   --   --   --  36 35  ALKPHOS 137*  --   --   --   --  96 73  BILITOT 2.5*  --   --   --   --  0.9 0.7  GFRNONAA 12*   < >  --    < > 24* 28*  28* >60  ANIONGAP NOT CALCULATED   < >  --    < > 20* 15  15 10    < > = values in this interval not displayed.    Lipids No results for input(s): CHOL, TRIG, HDL, LABVLDL, LDLCALC, CHOLHDL in the last 168 hours.  Hematology Recent Labs  Lab 07/03/21 1025 07/03/21 1050 07/04/21 0243 07/05/21 0254  WBC 24.3*  --  10.6* 6.3  RBC 3.84*  --  3.45* 3.37*  HGB 11.8* 13.9 10.5* 10.1*  HCT 43.2 41.0 30.2* 30.1*  MCV 112.5*  --  87.5 89.3  MCH 30.7  --  30.4 30.0  MCHC 27.3*  --  34.8 33.6  RDW 15.0  --  14.6 15.9*  PLT 304  --  166 132*   Thyroid No results for input(s): TSH, FREET4 in the last 168 hours.  BNPNo results for input(s): BNP, PROBNP in the last 168 hours.  DDimer  Recent Labs  Lab  07/03/21 2234  DDIMER 1.92*     Radiology    NM Pulmonary Perfusion  Result Date: 07/04/2021 CLINICAL DATA:  Chest pain, short of breath EXAM: NUCLEAR MEDICINE PERFUSION LUNG SCAN TECHNIQUE: Perfusion images were obtained in multiple projections after intravenous injection of radiopharmaceutical. RADIOPHARMACEUTICALS:  4.4 mCi Tc-35m MAA COMPARISON:  Chest radiograph 07/03/2021 FINDINGS: No wedge-shaped peripheral perfusion defect within LEFT or RIGHT lung to suggest acute pulmonary embolism. IMPRESSION: No evidence acute pulmonary embolism. Electronically Signed   By: Genevive Bi M.D.   On: 07/04/2021 15:25   DG Chest Portable 1 View  Result Date: 07/03/2021 CLINICAL DATA:  Altered mental status. EXAM: PORTABLE CHEST 1 VIEW COMPARISON:  06/19/2021 FINDINGS: 1132 hours. Rightward patient rotation. The lungs are clear without focal pneumonia, edema, pneumothorax or pleural effusion. The cardiopericardial silhouette is within normal limits for size. The visualized bony structures of the thorax show no acute abnormality. Telemetry leads overlie the chest. IMPRESSION: No active disease. Electronically Signed   By: Kennith Center M.D.   On: 07/03/2021 12:11   ECHOCARDIOGRAM COMPLETE  Result Date: 07/04/2021    ECHOCARDIOGRAM REPORT   Patient Name:   Madison Williams Date of Exam: 07/04/2021 Medical Rec #:  [id number]             Height:       67.0 in Accession #:    [id number]            Weight:       122.6 lb Date of Birth:  23-Jan-1986              BSA:          1.642 m Patient  Age:    35 years              BP:           119/77 mmHg Patient Gender: F                     HR:           86 bpm. Exam Location:  Inpatient Procedure: 2D Echo, 3D Echo, Cardiac Doppler and Color Doppler Indications:    R06.02 SOB. Respiratory distress.  History:        Patient has prior history of Echocardiogram examinations, most                 recent 11/25/2019. Abnormal ECG, Aortic Valve Disease,                  Signs/Symptoms:Chest Pain, Shortness of Breath and Dyspnea; Risk                 Factors:Diabetes and Current Smoker. Substance withdrawal.                 Moderate AI.  Sonographer:    Sheralyn Boatman RDCS Referring Phys: [phone] [facility]  Sonographer Comments: Technically difficult study due to poor echo windows. Image acquisition challenging due to uncooperative patient. Study interrrupted for meds. Patient pushed probe away in subcostals. IMPRESSIONS  1. Left ventricular ejection fraction, by estimation, is 60 to 65%. Left ventricular ejection fraction by 3D volume is 64 %. The left ventricle has normal function. The left ventricle has no regional wall motion abnormalities. Left ventricular diastolic  parameters were normal.  2. Right ventricular systolic function is normal. The right ventricular size is normal.  3. The mitral valve is normal in structure. Trivial mitral valve regurgitation. No evidence of mitral stenosis.  4. The aortic valve was not well visualized. Aortic valve regurgitation is mild. No aortic stenosis is present. FINDINGS  Left Ventricle: Left ventricular ejection fraction, by estimation, is 60 to 65%. Left ventricular ejection fraction by 3D volume is 64 %. The left ventricle has normal function. The left ventricle has no regional wall motion abnormalities. The left ventricular internal cavity size was normal in size. There is no left ventricular hypertrophy. Left ventricular diastolic parameters were normal. Right Ventricle: The right ventricular size is normal. No increase in right ventricular wall thickness. Right ventricular systolic function is normal. The tricuspid regurgitant velocity is 2.37 m/s, and with an assumed right atrial pressure of 3 mmHg, the estimated right ventricular systolic pressure is 25.5 mmHg. Left Atrium: Left atrial size was normal in size. Right Atrium: Right atrial size was normal in size. Pericardium: There is no evidence of pericardial effusion. Mitral Valve:  The mitral valve is normal in structure. Trivial mitral valve regurgitation. No evidence of mitral valve stenosis. Tricuspid Valve: The tricuspid valve is normal in structure. Tricuspid valve regurgitation is trivial. Aortic Valve: The aortic valve was not well visualized. Aortic valve regurgitation is mild. No aortic stenosis is present. Pulmonic Valve: The pulmonic valve was not well visualized. Pulmonic valve regurgitation is not visualized. Aorta: The aortic root is normal in size and structure. IAS/Shunts: The interatrial septum was not well visualized.  LEFT VENTRICLE PLAX 2D LVIDd:         4.20 cm         Diastology LVIDs:         2.80 cm         LV e' medial:  13.90 cm/s LV PW:         0.80 cm         LV E/e' medial:  6.5 LV IVS:        0.90 cm         LV e' lateral:   15.30 cm/s LVOT diam:     1.90 cm         LV E/e' lateral: 5.9 LV SV:         66 LV SV Index:   40 LVOT Area:     2.84 cm        3D Volume EF                                LV 3D EF:    Left                                             ventricul LV Volumes (MOD)                            ar LV vol d, MOD    77.5 ml                    ejection A2C:                                        fraction LV vol d, MOD    76.0 ml                    by 3D A4C:                                        volume is LV vol s, MOD    34.4 ml                    64 %. A2C: LV vol s, MOD    26.3 ml A4C:                           3D Volume EF: LV SV MOD A2C:   43.1 ml       3D EF:        64 % LV SV MOD A4C:   76.0 ml       LV EDV:       122 ml LV SV MOD BP:    45.5 ml       LV ESV:       44 ml                                LV SV:        78 ml RIGHT VENTRICLE RV S prime:     14.50 cm/s TAPSE (M-mode): 1.6 cm LEFT ATRIUM             Index        RIGHT ATRIUM          Index LA  diam:        2.50 cm 1.52 cm/m   RA Area:     9.91 cm LA Vol (A2C):   42.5 ml 25.88 ml/m  RA Volume:   20.80 ml 12.66 ml/m LA Vol (A4C):   22.6 ml 13.76 ml/m LA Biplane Vol: 32.3 ml 19.67  ml/m  AORTIC VALVE LVOT Vmax:   136.00 cm/s LVOT Vmean:  90.800 cm/s LVOT VTI:    0.232 m  AORTA Ao Root diam: 3.40 cm MITRAL VALVE               TRICUSPID VALVE MV Area (PHT): 4.39 cm    TR Peak grad:   22.5 mmHg MV Decel Time: 173 msec    TR Vmax:        237.00 cm/s MV E velocity: 90.00 cm/s MV A velocity: 62.60 cm/s  SHUNTS MV E/A ratio:  1.44        Systemic VTI:  0.23 m                            Systemic Diam: 1.90 cm Epifanio Lesches MD Electronically signed by Epifanio Lesches MD Signature Date/Time: 07/04/2021/10:26:27 AM    Final     Cardiac Studies   ECHO: 07/04/2021   1. Left ventricular ejection fraction, by estimation, is 60 to 65%. Left  ventricular ejection fraction by 3D volume is 64 %. The left ventricle has normal function. The left ventricle has no regional wall motion  abnormalities. Left ventricular diastolic parameters were normal.   2. Right ventricular systolic function is normal. The right ventricular  size is normal.   3. The mitral valve is normal in structure. Trivial mitral valve  regurgitation. No evidence of mitral stenosis.   4. The aortic valve was not well visualized. Aortic valve regurgitation  is mild. No aortic stenosis is present.   Patient Profile     35 y.o. female with a hx of DM1, HTN, HLD, RA, asthma, gastroparesis, hx DKA admits, STEMI 11/24/2019  w/ trop > 27,000 in setting of DKA/AKI felt 2nd spasm or resolved thrombus c/b RP hematoma, hx abuse kratom and ETOH.  She was admitted  07/03/2021 w/ / DKA, encephalopathic, hypotensive, pH 6.89, creatinine 4.19, BUN 71, Gluc 1,016, K+ 6.7, Na 124, trop peak 1,441 >> Cards asked to see.   Assessment & Plan    Chest pain - peak trop 1,445 - EF nl on echo w/ no WMA and nl diastolic function - with prev MI, ez were much higher and there was concern for thrombus or spasm, no CAD - do not think thrombus this admit, but spasm is a possibility - she is on metop 25 mg bid - could add low-dose  amlodipine on Imdur, discuss w/ MD - discuss if further eval needed this admit, or discuss w/ pt as outpt  2. Encephalopathy 2nd DKA - LOC has improved, as have electrolytes and renal function w/ hydration and meds - I/O +4.2 L since admit - mgt per CCM/IM    For questions or updates, please contact [facility] Please consult [email or web address] for contact info under        Signed, Theodore Demark, PA-C  07/05/2021, 8:08 AM

## 2021-07-05 NOTE — Progress Notes (Signed)
PROGRESS NOTE    Bird Tailor  RXV:[id number] DOB: 08-Oct-1985 DOA: 07/03/2021 PCP: Charlott Rakes, MD    Brief Narrative:  35yo with hx DM2, RA, recent COVID who presented with AMS with hyperglycemia.Pt was admitted for DKA, found to have markedly elevated troponins, ARF, and profound acidosis  Assessment & Plan:   Principal Problem:   DKA (diabetic ketoacidosis) ([place]) Active Problems:   Acute renal failure (HCC)   Type 1 diabetes mellitus with hyperglycemia (HCC)   Mild intermittent asthma without complication   Elevated troponin   Rheumatoid arthritis (HCC)   Hyperkalemia   SIRS (systemic inflammatory response syndrome) (HCC)   Septic shock (HCC)   Hypermagnesemia   Hyperphosphatemia  Active Problems:   AKI (acute kidney injury) ([place]) Suspected dehydration in setting of DKA Improved with aggressive hydration Renal function is improving Repeat bmet in AM     SIRS (systemic inflammatory response syndrome) ([place])   Vs sepsis due to undetermined organism Sepsis criteria met but these findings could also be DKA. Was started on broad-spectrum IV antibiotic therapy Procal at presentation was elevated at 2.71 Had been cont on IVF hydration 1/2 blood cx with strep species. Urine cx with >100,000 lactobacillus species     Elevated troponin Initially suspected demand ischemia. Trend troponin levels trended up, peak to 1445 Pt has reported hx of chest pains Therapeutic heparin gtt initially started, VQ neg for PE Cardiology had been following, has since signed off Heparin gtt now stopped and pt transitioned to prophylactic lovenox     Hyperkalemia, hypokalemia Resolved, now low this AM Will replace potassium today Recheck bmet in AM     Hyperphosphatemia Continue IV fluids as tolerated     Hypermagnesemia Continue IV fluids Mg levels normalized this AM    Mild intermittent asthma without complication Supplemental oxygen as needed. Bronchodilators as  needed. Without wheezing or sob this AM     Rheumatoid arthritis ([place]) Currently not on therapy. Rheumatoid factor and anti-CCP were obtained, pending ESR is normal at 7  Prolonged QTc -Earlier QTc of over 500's, now improved to 490's after 4 runs of KCl -Cont to replace potassium -Repeat EKG later today -Recent Mg was normal  Acute pain and nausea -complaining of primarily back pains with nausea -Presenting lipase neg -Suspecting element of gastroparesis -Earlier QTc in the 500's, improved to 490's with KCl replacement this AM -Will give another 4 runs of KCl and repeat EKG. If QTc improves, would consider scheduled reglan -For now, will give trial of PRN robaxin   DVT prophylaxis: Heparin gtt Code Status: Full Family Communication: Pt in room, family not at bedside  Status is: Inpatient  Requires continued inpatient stay because: Severity of illness   Consultants:  PCCM Cardiology  Procedures:    Antimicrobials: Anti-infectives (From admission, onward)    Start     Dose/Rate Route Frequency Ordered Stop   07/05/21 1800  vancomycin (VANCOREADY) IVPB 750 mg/150 mL  Status:  Discontinued        750 mg 150 mL/hr over 60 Minutes Intravenous Every 48 hours 07/03/21 1607 07/04/21 1138   07/04/21 2200  cefTRIAXone (ROCEPHIN) 2 g in sodium chloride 0.9 % 100 mL IVPB        2 g 200 mL/hr over 30 Minutes Intravenous Every 24 hours 07/04/21 1138     07/04/21 0800  ceFEPIme (MAXIPIME) 2 g in sodium chloride 0.9 % 100 mL IVPB  Status:  Discontinued        2 g 200  mL/hr over 30 Minutes Intravenous Every 12 hours 07/04/21 0720 07/04/21 1138   07/03/21 1800  metroNIDAZOLE (FLAGYL) IVPB 500 mg  Status:  Discontinued        500 mg 100 mL/hr over 60 Minutes Intravenous Every 12 hours 07/03/21 1558 07/04/21 1138   07/03/21 1700  ceFEPIme (MAXIPIME) 2 g in sodium chloride 0.9 % 100 mL IVPB  Status:  Discontinued        2 g 200 mL/hr over 30 Minutes Intravenous Every 24 hours  07/03/21 1558 07/04/21 0720   07/03/21 1645  vancomycin (VANCOREADY) IVPB 1000 mg/200 mL        1,000 mg 200 mL/hr over 60 Minutes Intravenous  Once 07/03/21 1558 07/03/21 1747   07/03/21 1115  cefTRIAXone (ROCEPHIN) 2 g in sodium chloride 0.9 % 100 mL IVPB        2 g 200 mL/hr over 30 Minutes Intravenous  Once 07/03/21 1110 07/03/21 1152       Subjective: Complaining of back pain and nausea  Objective: Vitals:   07/05/21 1007 07/05/21 1146 07/05/21 1219 07/05/21 1220  BP: (!) 136/108 (!) 136/104    Pulse: 86 82 83   Resp: 20 (!) 40 (!) 27   Temp:    98.1 F (36.7 C)  TempSrc:    Oral  SpO2: 98% 99% 97%   Weight:      Height:        Intake/Output Summary (Last 24 hours) at 07/05/2021 1258 Last data filed at 07/05/2021 1219 Gross per 24 hour  Intake 1482.57 ml  Output 200 ml  Net 1282.57 ml    Filed Weights   07/03/21 1530  Weight: 55.6 kg    Examination: General exam: Conversant, in no acute distress Respiratory system: normal chest rise, clear, no audible wheezing Cardiovascular system: regular rhythm, s1-s2 Gastrointestinal system: Nondistended, nontender, pos BS Central nervous system: No seizures, no tremors Extremities: No cyanosis, no joint deformities Skin: No rashes, no pallor Psychiatry: Affect normal // no auditory hallucinations   Data Reviewed: I have personally reviewed following labs and imaging studies  CBC: Recent Labs  Lab 07/03/21 1025 07/03/21 1050 07/04/21 0243 07/05/21 0254  WBC 24.3*  --  10.6* 6.3  NEUTROABS 17.4*  --   --   --   HGB 11.8* 13.9 10.5* 10.1*  HCT 43.2 41.0 30.2* 30.1*  MCV 112.5*  --  87.5 89.3  PLT 304  --  166 132*    Basic Metabolic Panel: Recent Labs  Lab 07/03/21 1400 07/03/21 1448 07/03/21 1812 07/03/21 2147 07/04/21 0243 07/05/21 0254  NA 134*  --  140 144 144  145 147*  K 4.2  --  4.0 3.6 3.6  3.6 3.1*  CL 95*  --  102 107 109  109 114*  CO2 <7*  --  11* 17* 20*  21* 23  GLUCOSE 1,016*  --   518* 241* 178*  179* 83  BUN 61*  --  53* 51* 48*  48* 30*  CREATININE 4.19*  --  3.42* 2.60* 2.26*  2.29* 0.98  CALCIUM 7.0*  --  7.3* 7.5* 7.4*  7.3* 7.5*  MG  --  3.1*  --   --  2.0  --   PHOS  --  8.4*  --   --  4.0  --     GFR: Estimated Creatinine Clearance: 71 mL/min (by C-G formula based on SCr of 0.98 mg/dL). Liver Function Tests: Recent Labs  Lab 07/03/21 1026 07/04/21 0243  07/05/21 0254  AST 28 37 78*  ALT 40 36 35  ALKPHOS 137* 96 73  BILITOT 2.5* 0.9 0.7  PROT 6.8 6.2* 5.8*  ALBUMIN 4.0 3.4* 3.2*    Recent Labs  Lab 07/03/21 1026  LIPASE 45    No results for input(s): AMMONIA in the last 168 hours. Coagulation Profile: Recent Labs  Lab 07/04/21 0243  INR 1.1    Cardiac Enzymes: Recent Labs  Lab 07/03/21 1400  CKTOTAL 270*  CKMB 15.5*    BNP (last 3 results) No results for input(s): PROBNP in the last 8760 hours. HbA1C: Recent Labs    07/03/21 1025  HGBA1C 9.6*    CBG: Recent Labs  Lab 07/04/21 1955 07/04/21 2347 07/05/21 0345 07/05/21 0902 07/05/21 1144  GLUCAP 114* 128* 74 124* 262*    Lipid Profile: No results for input(s): CHOL, HDL, LDLCALC, TRIG, CHOLHDL, LDLDIRECT in the last 72 hours. Thyroid Function Tests: No results for input(s): TSH, T4TOTAL, FREET4, T3FREE, THYROIDAB in the last 72 hours. Anemia Panel: No results for input(s): VITAMINB12, FOLATE, FERRITIN, TIBC, IRON, RETICCTPCT in the last 72 hours. Sepsis Labs: Recent Labs  Lab 07/03/21 1045 07/03/21 1251 07/03/21 1448 07/03/21 2234 07/04/21 0243 07/05/21 0254  PROCALCITON  --   --  2.89  --  2.71 0.67  LATICACIDVEN 3.2* 2.1* 1.8 1.5  --   --      Recent Results (from the past 240 hour(s))  Blood culture (routine x 2)     Status: Abnormal (Preliminary result)   Collection Time: 07/03/21 10:47 AM   Specimen: BLOOD RIGHT ARM  Result Value Ref Range Status   Specimen Description   Final    BLOOD RIGHT ARM Performed at [facility] [address] [place], [place] [address]    Special Requests   Final    BOTTLES DRAWN AEROBIC AND ANAEROBIC Blood Culture adequate volume Performed at [facility] [address] [place], [place] [address]    Culture  Setup Time   Final    GRAM POSITIVE COCCI IN CHAINS ANAEROBIC BOTTLE ONLY CRITICAL RESULT CALLED TO, READ BACK BY AND VERIFIED WITH: PHARM D D.WOFFORD ON [id number] AT 0932 BY E.PARRISH    Culture (A)  Final    STREPTOCOCCUS MITIS/ORALIS THE SIGNIFICANCE OF ISOLATING THIS ORGANISM FROM A SINGLE SET OF BLOOD CULTURES WHEN MULTIPLE SETS ARE DRAWN IS UNCERTAIN. PLEASE NOTIFY THE MICROBIOLOGY DEPARTMENT WITHIN ONE WEEK IF SPECIATION AND SENSITIVITIES ARE REQUIRED. Performed at [facility], [place] [address] [place], [place] [address]    Report Status PENDING  Incomplete  Blood Culture ID Panel (Reflexed)     Status: Abnormal   Collection Time: 07/03/21 10:47 AM  Result Value Ref Range Status   Enterococcus faecalis NOT DETECTED NOT DETECTED Final   Enterococcus Faecium NOT DETECTED NOT DETECTED Final   Listeria monocytogenes NOT DETECTED NOT DETECTED Final   Staphylococcus species NOT DETECTED NOT DETECTED Final   Staphylococcus aureus (BCID) NOT DETECTED NOT DETECTED Final   Staphylococcus epidermidis NOT DETECTED NOT DETECTED Final   Staphylococcus lugdunensis NOT DETECTED NOT DETECTED Final   Streptococcus species DETECTED (A) NOT DETECTED Final    Comment: Not Enterococcus species, Streptococcus agalactiae, Streptococcus pyogenes, or Streptococcus pneumoniae. CRITICAL RESULT CALLED TO, READ BACK BY AND VERIFIED WITH: PHARM D D.WOFFORD ON [id number] AT 0623 BY E.PARRISH    Streptococcus agalactiae NOT DETECTED NOT DETECTED Final   Streptococcus pneumoniae NOT DETECTED NOT DETECTED Final   Streptococcus  pyogenes NOT DETECTED NOT DETECTED Final   A.calcoaceticus-baumannii NOT DETECTED NOT DETECTED Final   Bacteroides fragilis NOT DETECTED NOT DETECTED  Final   Enterobacterales NOT DETECTED NOT DETECTED Final   Enterobacter cloacae complex NOT DETECTED NOT DETECTED Final   Escherichia coli NOT DETECTED NOT DETECTED Final   Klebsiella aerogenes NOT DETECTED NOT DETECTED Final   Klebsiella oxytoca NOT DETECTED NOT DETECTED Final   Klebsiella pneumoniae NOT DETECTED NOT DETECTED Final   Proteus species NOT DETECTED NOT DETECTED Final   Salmonella species NOT DETECTED NOT DETECTED Final   Serratia marcescens NOT DETECTED NOT DETECTED Final   Haemophilus influenzae NOT DETECTED NOT DETECTED Final   Neisseria meningitidis NOT DETECTED NOT DETECTED Final   Pseudomonas aeruginosa NOT DETECTED NOT DETECTED Final   Stenotrophomonas maltophilia NOT DETECTED NOT DETECTED Final   Candida albicans NOT DETECTED NOT DETECTED Final   Candida auris NOT DETECTED NOT DETECTED Final   Candida glabrata NOT DETECTED NOT DETECTED Final   Candida krusei NOT DETECTED NOT DETECTED Final   Candida parapsilosis NOT DETECTED NOT DETECTED Final   Candida tropicalis NOT DETECTED NOT DETECTED Final   Cryptococcus neoformans/gattii NOT DETECTED NOT DETECTED Final    Comment: Performed at [facility], [address], [place], [place] [address]  Blood culture (routine x 2)     Status: None (Preliminary result)   Collection Time: 07/03/21 11:03 AM   Specimen: BLOOD  Result Value Ref Range Status   Specimen Description   Final    BLOOD RIGHT ANTECUBITAL Performed at [facility] [address] [place], [place] [address]    Special Requests   Final    BOTTLES DRAWN AEROBIC AND ANAEROBIC Blood Culture adequate volume Performed at [facility] [address], [place], [place] [address]    Culture   Final    NO GROWTH 2 DAYS Performed at [facility] [address] [place], [place] [address]    Report Status PENDING  Incomplete  Resp Panel by RT-PCR (Flu A&B, Covid) Nasopharyngeal Swab     Status: None    Collection Time: 07/03/21 11:16 AM   Specimen: Nasopharyngeal Swab; Nasopharyngeal(NP) swabs in vial transport medium  Result Value Ref Range Status   SARS Coronavirus 2 by RT PCR NEGATIVE NEGATIVE Final    Comment: (NOTE) SARS-CoV-2 target nucleic acids are NOT DETECTED.  The SARS-CoV-2 RNA is generally detectable in upper respiratory specimens during the acute phase of infection. The lowest concentration of SARS-CoV-2 viral copies this assay can detect is 138 copies/mL. A negative result does not preclude SARS-Cov-2 infection and should not be used as the sole basis for treatment or other patient management decisions. A negative result may occur with  improper specimen collection/handling, submission of specimen other than nasopharyngeal swab, presence of viral mutation(s) within the areas targeted by this assay, and inadequate number of viral copies(<138 copies/mL). A negative result must be combined with clinical observations, patient history, and epidemiological information. The expected result is Negative.  Fact Sheet for Patients:  EntrepreneurPulse.com.au  Fact Sheet for Healthcare Providers:  IncredibleEmployment.be  This test is no t yet approved or cleared by the [place] FDA and  has been authorized for detection and/or diagnosis of SARS-CoV-2 by FDA under an Emergency Use Authorization (EUA). This EUA will remain  in effect (meaning this test can be used) for the duration of the COVID-19 declaration under Section 564(b)(1) of the Act, 21 U.S.C.section 360bbb-3(b)(1), unless the authorization  is terminated  or revoked sooner.       Influenza A by PCR NEGATIVE NEGATIVE Final   Influenza B by PCR NEGATIVE NEGATIVE Final    Comment: (NOTE) The Xpert Xpress SARS-CoV-2/FLU/RSV plus assay is intended as an aid in the diagnosis of influenza from Nasopharyngeal swab specimens and should not be used as a sole basis for treatment.  Nasal washings and aspirates are unacceptable for Xpert Xpress SARS-CoV-2/FLU/RSV testing.  Fact Sheet for Patients: EntrepreneurPulse.com.au  Fact Sheet for Healthcare Providers: IncredibleEmployment.be  This test is not yet approved or cleared by the [place] FDA and has been authorized for detection and/or diagnosis of SARS-CoV-2 by FDA under an Emergency Use Authorization (EUA). This EUA will remain in effect (meaning this test can be used) for the duration of the COVID-19 declaration under Section 564(b)(1) of the Act, 21 U.S.C. section 360bbb-3(b)(1), unless the authorization is terminated or revoked.  Performed at [facility], [place] [address] [place], [place] [address]   Urine Culture     Status: Abnormal   Collection Time: 07/03/21 11:57 AM   Specimen: In/Out Cath Urine  Result Value Ref Range Status   Specimen Description   Final    IN/OUT CATH URINE Performed at [facility] [address] [place], [place] [address]    Special Requests   Final    NONE Performed at [facility], [place] [address], [place], [place] [address]    Culture (A)  Final    >=100,000 COLONIES/mL LACTOBACILLUS SPECIES Standardized susceptibility testing for this organism is not available. Performed at [facility], [place] [address] [place], [place] [address]    Report Status 07/04/2021 FINAL  Final  MRSA Next Gen by PCR, Nasal     Status: None   Collection Time: 07/03/21  3:54 PM   Specimen: Nasal Mucosa; Nasal Swab  Result Value Ref Range Status   MRSA by PCR Next Gen NOT DETECTED NOT DETECTED Final    Comment: (NOTE) The GeneXpert MRSA Assay (FDA approved for NASAL specimens only), is one component of a comprehensive MRSA colonization surveillance program. It is not intended to diagnose MRSA infection nor to guide or monitor treatment for MRSA infections. Test performance is not FDA  approved in patients less than 60 years old. Performed at [facility], [place] [address], [place],  [address]       Radiology Studies: NM Pulmonary Perfusion  Result Date: 07/04/2021 CLINICAL DATA:  Chest pain, short of breath EXAM: NUCLEAR MEDICINE PERFUSION LUNG SCAN TECHNIQUE: Perfusion images were obtained in multiple projections after intravenous injection of radiopharmaceutical. RADIOPHARMACEUTICALS:  4.4 mCi Tc-59mMAA COMPARISON:  Chest radiograph 07/03/2021 FINDINGS: No wedge-shaped peripheral perfusion defect within LEFT or RIGHT lung to suggest acute pulmonary embolism. IMPRESSION: No evidence acute pulmonary embolism. Electronically Signed   By: SSuzy BouchardM.D.   On: 07/04/2021 15:25   ECHOCARDIOGRAM COMPLETE  Result Date: 07/04/2021    ECHOCARDIOGRAM REPORT   Patient Name:   DTANDREA KOMMERDate of Exam: 07/04/2021 Medical Rec #:  [id number]            Height:       67.0 in Accession #:    [id number]           Weight:       122.6 lb Date of Birth:  [birth year]             BSA:  1.642 m Patient Age:    35 years              BP:           119/77 mmHg Patient Gender: F                     HR:           86 bpm. Exam Location:  Inpatient Procedure: 2D Echo, 3D Echo, Cardiac Doppler and Color Doppler Indications:    R06.02 SOB. Respiratory distress.  History:        Patient has prior history of Echocardiogram examinations, most                 recent 11/25/2019. Abnormal ECG, Aortic Valve Disease,                 Signs/Symptoms:Chest Pain, Shortness of Breath and Dyspnea; Risk                 Factors:Diabetes and Current Smoker. Substance withdrawal.                 Moderate AI.  Sonographer:    Roseanna Rainbow RDCS Referring Phys: [phone] [facility]  Sonographer Comments: Technically difficult study due to poor echo windows. Image acquisition challenging due to uncooperative patient. Study interrrupted for meds. Patient pushed probe away in subcostals.  IMPRESSIONS  1. Left ventricular ejection fraction, by estimation, is 60 to 65%. Left ventricular ejection fraction by 3D volume is 64 %. The left ventricle has normal function. The left ventricle has no regional wall motion abnormalities. Left ventricular diastolic  parameters were normal.  2. Right ventricular systolic function is normal. The right ventricular size is normal.  3. The mitral valve is normal in structure. Trivial mitral valve regurgitation. No evidence of mitral stenosis.  4. The aortic valve was not well visualized. Aortic valve regurgitation is mild. No aortic stenosis is present. FINDINGS  Left Ventricle: Left ventricular ejection fraction, by estimation, is 60 to 65%. Left ventricular ejection fraction by 3D volume is 64 %. The left ventricle has normal function. The left ventricle has no regional wall motion abnormalities. The left ventricular internal cavity size was normal in size. There is no left ventricular hypertrophy. Left ventricular diastolic parameters were normal. Right Ventricle: The right ventricular size is normal. No increase in right ventricular wall thickness. Right ventricular systolic function is normal. The tricuspid regurgitant velocity is 2.37 m/s, and with an assumed right atrial pressure of 3 mmHg, the estimated right ventricular systolic pressure is 86.5 mmHg. Left Atrium: Left atrial size was normal in size. Right Atrium: Right atrial size was normal in size. Pericardium: There is no evidence of pericardial effusion. Mitral Valve: The mitral valve is normal in structure. Trivial mitral valve regurgitation. No evidence of mitral valve stenosis. Tricuspid Valve: The tricuspid valve is normal in structure. Tricuspid valve regurgitation is trivial. Aortic Valve: The aortic valve was not well visualized. Aortic valve regurgitation is mild. No aortic stenosis is present. Pulmonic Valve: The pulmonic valve was not well visualized. Pulmonic valve regurgitation is not  visualized. Aorta: The aortic root is normal in size and structure. IAS/Shunts: The interatrial septum was not well visualized.  LEFT VENTRICLE PLAX 2D LVIDd:         4.20 cm         Diastology LVIDs:         2.80 cm         LV  e' medial:    13.90 cm/s LV PW:         0.80 cm         LV E/e' medial:  6.5 LV IVS:        0.90 cm         LV e' lateral:   15.30 cm/s LVOT diam:     1.90 cm         LV E/e' lateral: 5.9 LV SV:         66 LV SV Index:   40 LVOT Area:     2.84 cm        3D Volume EF                                LV 3D EF:    Left                                             ventricul LV Volumes (MOD)                            ar LV vol d, MOD    77.5 ml                    ejection A2C:                                        fraction LV vol d, MOD    76.0 ml                    by 3D A4C:                                        volume is LV vol s, MOD    34.4 ml                    64 %. A2C: LV vol s, MOD    26.3 ml A4C:                           3D Volume EF: LV SV MOD A2C:   43.1 ml       3D EF:        64 % LV SV MOD A4C:   76.0 ml       LV EDV:       122 ml LV SV MOD BP:    45.5 ml       LV ESV:       44 ml                                LV SV:        78 ml RIGHT VENTRICLE RV S prime:     14.50 cm/s TAPSE (M-mode): 1.6 cm LEFT ATRIUM             Index        RIGHT ATRIUM  Index LA diam:        2.50 cm 1.52 cm/m   RA Area:     9.91 cm LA Vol (A2C):   42.5 ml 25.88 ml/m  RA Volume:   20.80 ml 12.66 ml/m LA Vol (A4C):   22.6 ml 13.76 ml/m LA Biplane Vol: 32.3 ml 19.67 ml/m  AORTIC VALVE LVOT Vmax:   136.00 cm/s LVOT Vmean:  90.800 cm/s LVOT VTI:    0.232 m  AORTA Ao Root diam: 3.40 cm MITRAL VALVE               TRICUSPID VALVE MV Area (PHT): 4.39 cm    TR Peak grad:   22.5 mmHg MV Decel Time: 173 msec    TR Vmax:        237.00 cm/s MV E velocity: 90.00 cm/s MV A velocity: 62.60 cm/s  SHUNTS MV E/A ratio:  1.44        Systemic VTI:  0.23 m                            Systemic Diam: 1.90 cm  Oswaldo Milian MD Electronically signed by Oswaldo Milian MD Signature Date/Time: 07/04/2021/10:26:27 AM    Final     Scheduled Meds:  amLODipine  2.5 mg Oral Daily   aspirin  81 mg Oral Daily   Chlorhexidine Gluconate Cloth  6 each Topical Daily   clonazePAM  1 mg Oral BID   enoxaparin (LOVENOX) injection  40 mg Subcutaneous Q24H   FLUoxetine  20 mg Oral BID   [START ON 07/06/2021] influenza vac split quadrivalent PF  0.5 mL Intramuscular Tomorrow-1000   insulin aspart  0-15 Units Subcutaneous TID WC   insulin aspart  0-5 Units Subcutaneous QHS   lamoTRIgine  25 mg Oral QHS   metoprolol tartrate  25 mg Oral BID   pantoprazole (PROTONIX) IV  40 mg Intravenous Q24H   sodium zirconium cyclosilicate  10 g Oral Once   Continuous Infusions:  sodium chloride Stopped (07/05/21 1215)   cefTRIAXone (ROCEPHIN)  IV Stopped (07/04/21 2205)   potassium chloride 100 mL/hr at 07/05/21 1219   promethazine (PHENERGAN) injection (IM or IVPB) Stopped (07/05/21 0920)     LOS: 1 day   Marylu Lund, MD [facility] Pager On Amion  If 7PM-7AM, please contact night-coverage 07/05/2021, 12:58 PM

## 2021-07-05 NOTE — Progress Notes (Signed)
ANTICOAGULATION CONSULT NOTE - Follow Up Consult  Pharmacy Consult for heparin Indication: elevated troponin  No Known Allergies  Patient Measurements: Height: 5\' 7"  (170.2 cm) Weight: 55.6 kg (122 lb 9.2 oz) IBW/kg (Calculated) : 61.6 Heparin Dosing Weight: 56 kg  Vital Signs: Temp: 100.3 F (37.9 C) (12/06 0000) Temp Source: Oral (12/06 0000) BP: 118/94 (12/06 0700) Pulse Rate: 73 (12/06 0700)  Labs: Recent Labs    07/03/21 1025 07/03/21 1026 07/03/21 1050 07/03/21 1400 07/03/21 1448 07/03/21 2147 07/04/21 0027 07/04/21 0243 07/04/21 0521 07/04/21 0930 07/04/21 1610 07/05/21 0254  HGB 11.8*  --  13.9  --   --   --   --  10.5*  --   --   --  10.1*  HCT 43.2  --  41.0  --   --   --   --  30.2*  --   --   --  30.1*  PLT 304  --   --   --   --   --   --  166  --   --   --  132*  APTT  --   --   --   --   --   --   --  36  --   --   --   --   LABPROT  --   --   --   --   --   --   --  14.0  --   --   --   --   INR  --   --   --   --   --   --   --  1.1  --   --   --   --   HEPARINUNFRC  --   --   --   --   --   --   --   --   --  0.31 0.42 0.42  CREATININE  --    < > 4.20* 4.19*   < > 2.60*  --  2.26*  2.29*  --   --   --  0.98  CKTOTAL  --   --   --  270*  --   --   --   --   --   --   --   --   CKMB  --   --   --  15.5*  --   --   --   --   --   --   --   --   TROPONINIHS  --   --   --   --    < >  --    < > 1,441* 1,278* 1,445*  --   --    < > = values in this interval not displayed.     Estimated Creatinine Clearance: 71 mL/min (by C-G formula based on SCr of 0.98 mg/dL).   Assessment: Patient is a 35 y.o F with hx DM presented to the ED on 07/03/21 with AMS and was found to have hyperglycemia.  She's currently on heparin drip for elevated troponin.  - 12/5 V/Q: No evidence acute pulmonary embolism  Today, 07/05/2021: - Heparin level remains therapeutic at 0.42 on heparin 950 units/hr - hgb stable, plts down 132K - no bleeding documented - scr improved to  0.98 with hydration   Goal of Therapy:  Heparin level 0.3-0.7 units/ml Monitor platelets by anticoagulation protocol: Yes   Plan:  - Continue heparin drip at 950 units/hr -  Monitor for s/sx bleeding, daily HL and CBC  Dorna Leitz, PharmD, BCPS 07/05/2021 7:41 AM

## 2021-07-05 NOTE — Progress Notes (Signed)
Hypoglycemic Event  CBG:66  Treatment: 4oz  Symptoms: none  Follow-up CBG: 2236 CBG Result 96  Possible Reasons for Event: low po intake  Comments/MD notified:protocol    Amo Kuffour, Alinda Money

## 2021-07-05 NOTE — Progress Notes (Signed)
Hypoglycemic Event  CBG: 57  Treatment: 8oz  Symptoms: none  Follow-up CBG: 2208  CBG Result:66  Possible Reasons for Event: reduce         po intake  Comments/MD notified                               protocol    Madison Williams, Alinda Money

## 2021-07-05 NOTE — Progress Notes (Signed)
NAME:  Madison Williams, MRN:  [id number], DOB:  04/05/1986, LOS: 1 ADMISSION DATE:  07/03/2021, CONSULTATION DATE:  12/4 REFERRING MD:  Lynelle Doctor, CHIEF COMPLAINT:  Chest pain, confused   History of Present Illness:  35 y/o female admitted with severe DKA and chest pain with a notably elevated troponin.    Pertinent  Medical History  Asthma DM1 poorly controlled with complications Depression RA Cigarette smoker Chronic systolic heart failure LVEF 35-40% on 10/2019 LHC (no CAD) COVID 19 03/2021 Gastroparesis   Significant Hospital Events: Including procedures, antibiotic start and stop dates in addition to other pertinent events   12/3 admission DKA 12/5 gap closed  Imaging: 12/5 Echo > LVEF normal, RV size/function normal 12/5 V/Q > no PE  Micro 12/4 sars cov 2/flu > neg 12/4 urine >  12/4 blood >   Abx 12/4 cefepime 12/4 ceftriaxone x1 12/4 flagyl x1 12/4 vanc x1   Interim History / Subjective:   Feels better today Still has back pain   Objective   Blood pressure (!) 136/108, pulse 82, temperature 100.3 F (37.9 C), temperature source Oral, resp. rate (!) 35, height 5\' 7"  (1.702 m), weight 55.6 kg, last menstrual period 03/16/2018, SpO2 97 %.        Intake/Output Summary (Last 24 hours) at 07/05/2021 0931 Last data filed at 07/05/2021 0759 Gross per 24 hour  Intake 1111.2 ml  Output 200 ml  Net 911.2 ml   Filed Weights   07/03/21 1530  Weight: 55.6 kg    Examination:  General:  Resting comfortably in bed HENT: NCAT OP clear PULM: CTA B, normal effort CV: RRR, no mgr GI: BS+, soft, nontender MSK: normal bulk and tone Neuro: awake, alert, no distress, MAEW    Resolved Hospital Problem list     Assessment & Plan:     Severe metabolic acidosis due to DKA > resolved Acute encephalopathy due to DKA> resolved Circulatory shock due to volume depletion > resolved AKI > resolved Chronic systolic heart failure > resolved Troponin elevation; 12  lead with non-specific ST wave changes anterior precordial leads Chest pain; unclear cause, demand ischemia related to  Anxiety Anemia of chronic diease  Gastroparesis baseline  Discussion Significant improvement. Not clear why troponin went so high but likely related to acute illness/DKA with severe acidosis  Plan: Heparin per primary/cardiology Insulin per primary  PCCM will be available prn    Best Practice (right click and "Reselect all SmartList Selections" daily)   Per [facility]  Labs   CBC: Recent Labs  Lab 07/03/21 1025 07/03/21 1050 07/04/21 0243 07/05/21 0254  WBC 24.3*  --  10.6* 6.3  NEUTROABS 17.4*  --   --   --   HGB 11.8* 13.9 10.5* 10.1*  HCT 43.2 41.0 30.2* 30.1*  MCV 112.5*  --  87.5 89.3  PLT 304  --  166 132*    Basic Metabolic Panel: Recent Labs  Lab 07/03/21 1400 07/03/21 1448 07/03/21 1812 07/03/21 2147 07/04/21 0243 07/05/21 0254  NA 134*  --  140 144 144  145 147*  K 4.2  --  4.0 3.6 3.6  3.6 3.1*  CL 95*  --  102 107 109  109 114*  CO2 <7*  --  11* 17* 20*  21* 23  GLUCOSE 1,016*  --  518* 241* 178*  179* 83  BUN 61*  --  53* 51* 48*  48* 30*  CREATININE 4.19*  --  3.42* 2.60* 2.26*  2.29* 0.98  CALCIUM 7.0*  --  7.3* 7.5* 7.4*  7.3* 7.5*  MG  --  3.1*  --   --  2.0  --   PHOS  --  8.4*  --   --  4.0  --    GFR: Estimated Creatinine Clearance: 71 mL/min (by C-G formula based on SCr of 0.98 mg/dL). Recent Labs  Lab 07/03/21 1025 07/03/21 1045 07/03/21 1251 07/03/21 1448 07/03/21 2234 07/04/21 0243 07/05/21 0254  PROCALCITON  --   --   --  2.89  --  2.71 0.67  WBC 24.3*  --   --   --   --  10.6* 6.3  LATICACIDVEN  --  3.2* 2.1* 1.8 1.5  --   --     Liver Function Tests: Recent Labs  Lab 07/03/21 1026 07/04/21 0243 07/05/21 0254  AST 28 37 78*  ALT 40 36 35  ALKPHOS 137* 96 73  BILITOT 2.5* 0.9 0.7  PROT 6.8 6.2* 5.8*  ALBUMIN 4.0 3.4* 3.2*   Recent Labs  Lab 07/03/21 1026  LIPASE 45   No results for  input(s): AMMONIA in the last 168 hours.  ABG    Component Value Date/Time   PHART 6.894 (LL) 07/03/2021 1120   PCO2ART 10.7 (LL) 07/03/2021 1120   PO2ART 165 (H) 07/03/2021 1120   HCO3 2.0 (L) 07/03/2021 1120   TCO2 6 (L) 07/03/2021 1050   ACIDBASEDEF NOT CALCULATED 07/03/2021 1120   O2SAT 96.4 07/03/2021 1120     Coagulation Profile: Recent Labs  Lab 07/04/21 0243  INR 1.1    Cardiac Enzymes: Recent Labs  Lab 07/03/21 1400  CKTOTAL 270*  CKMB 15.5*    HbA1C: HbA1c, POC (controlled diabetic range)  Date/Time Value Ref Range Status  07/07/2020 01:56 PM 9.8 (A) 0.0 - 7.0 % Final  04/06/2020 10:51 AM 10.7 (A) 0.0 - 7.0 % Final   Hgb A1c MFr Bld  Date/Time Value Ref Range Status  07/03/2021 10:25 AM 9.6 (H) 4.8 - 5.6 % Final    Comment:    (NOTE) Pre diabetes:          5.7%-6.4%  Diabetes:              >6.4%  Glycemic control for   <7.0% adults with diabetes   01/16/2021 08:02 AM 12.2 (H) 4.8 - 5.6 % Final    Comment:    (NOTE)         Prediabetes: 5.7 - 6.4         Diabetes: >6.4         Glycemic control for adults with diabetes: <7.0     CBG: Recent Labs  Lab 07/04/21 1537 07/04/21 1955 07/04/21 2347 07/05/21 0345 07/05/21 0902  GLUCAP 122* 114* 128* 74 124*    Critical care time: n/a minutes     Heber Hopkins, MD [facility] Pager: [phone] Cell: [phone] After 7:00 pm call Elink  [phone]

## 2021-07-05 NOTE — Progress Notes (Signed)
Inpatient Diabetes Program Recommendations  AACE/ADA: New Consensus Statement on Inpatient Glycemic Control (2015)  Target Ranges:  Prepandial:   less than 140 mg/dL      Peak postprandial:   less than 180 mg/dL (1-2 hours)      Critically ill patients:  140 - 180 mg/dL   Lab Results  Component Value Date   GLUCAP 262 (H) 07/05/2021   HGBA1C 9.6 (H) 07/03/2021    Review of Glycemic Control  Diabetes history: DM1 Outpatient Diabetes medications: Lantus 25 units QHS, Humalog 2-10 units TID Current orders for Inpatient glycemic control: Novolog 0-15 units TID with meals and 0-5 HS, Levemir 12 units BID, Levemir 12 units BID  HgbA1C - 9.6% Will likely need meal coverage insulin since Type 1 DM  Inpatient Diabetes Program Recommendations:    Add Novolog 3 units TID with meals ( this will need to be titrated daily) Decrease Novolog to 0-9 units TID with meals and 0-5 HS (since Type 1 and sensitive to insulin)  Spoke with pt at bedside regarding her HgbA1C of 9.6% and her glucose control at home. Pt states her diabetes is managed by her PCP. Said she needs to start checking blood sugars more often, but hasn't been doing this lately. Said her HgbA1c has been much higher, so she was glad it had come down to 9.6%. Discussed importance of tighter glucose control at home. Pt said she knows what to do, just needs to do it. No issues with getting meds. Had no questions/concerns.  Discussed above with RN.   Will continue to follow.  Thank you. Ailene Ards, RD, LDN, CDE Inpatient Diabetes Coordinator [phone]

## 2021-07-06 DIAGNOSIS — M069 Rheumatoid arthritis, unspecified: Secondary | ICD-10-CM

## 2021-07-06 DIAGNOSIS — E0811 Diabetes mellitus due to underlying condition with ketoacidosis with coma: Secondary | ICD-10-CM | POA: Diagnosis not present

## 2021-07-06 DIAGNOSIS — N170 Acute kidney failure with tubular necrosis: Secondary | ICD-10-CM

## 2021-07-06 DIAGNOSIS — E875 Hyperkalemia: Secondary | ICD-10-CM | POA: Diagnosis not present

## 2021-07-06 DIAGNOSIS — R778 Other specified abnormalities of plasma proteins: Secondary | ICD-10-CM | POA: Diagnosis not present

## 2021-07-06 DIAGNOSIS — R6510 Systemic inflammatory response syndrome (SIRS) of non-infectious origin without acute organ dysfunction: Secondary | ICD-10-CM

## 2021-07-06 DIAGNOSIS — E1065 Type 1 diabetes mellitus with hyperglycemia: Secondary | ICD-10-CM

## 2021-07-06 DIAGNOSIS — J4520 Mild intermittent asthma, uncomplicated: Secondary | ICD-10-CM

## 2021-07-06 LAB — CBC
HCT: 33.4 % — ABNORMAL LOW (ref 36.0–46.0)
Hemoglobin: 11 g/dL — ABNORMAL LOW (ref 12.0–15.0)
MCH: 30.1 pg (ref 26.0–34.0)
MCHC: 32.9 g/dL (ref 30.0–36.0)
MCV: 91.5 fL (ref 80.0–100.0)
Platelets: 105 10*3/uL — ABNORMAL LOW (ref 150–400)
RBC: 3.65 MIL/uL — ABNORMAL LOW (ref 3.87–5.11)
RDW: 16 % — ABNORMAL HIGH (ref 11.5–15.5)
WBC: 4.2 10*3/uL (ref 4.0–10.5)
nRBC: 0 % (ref 0.0–0.2)

## 2021-07-06 LAB — COMPREHENSIVE METABOLIC PANEL
ALT: 33 U/L (ref 0–44)
AST: 50 U/L — ABNORMAL HIGH (ref 15–41)
Albumin: 3.4 g/dL — ABNORMAL LOW (ref 3.5–5.0)
Alkaline Phosphatase: 80 U/L (ref 38–126)
Anion gap: 10 (ref 5–15)
BUN: 21 mg/dL — ABNORMAL HIGH (ref 6–20)
CO2: 20 mmol/L — ABNORMAL LOW (ref 22–32)
Calcium: 8 mg/dL — ABNORMAL LOW (ref 8.9–10.3)
Chloride: 111 mmol/L (ref 98–111)
Creatinine, Ser: 0.96 mg/dL (ref 0.44–1.00)
GFR, Estimated: >60 mL/min (ref >60)
Glucose, Bld: 282 mg/dL — ABNORMAL HIGH (ref 70–99)
Potassium: 3.8 mmol/L (ref 3.5–5.1)
Sodium: 141 mmol/L (ref 135–145)
Total Bilirubin: 0.7 mg/dL (ref 0.3–1.2)
Total Protein: 6.2 g/dL — ABNORMAL LOW (ref 6.5–8.1)

## 2021-07-06 LAB — GLUCOSE, CAPILLARY
Glucose-Capillary: 286 mg/dL — ABNORMAL HIGH (ref 70–99)
Glucose-Capillary: 226 mg/dL — ABNORMAL HIGH (ref 70–99)
Glucose-Capillary: 244 mg/dL — ABNORMAL HIGH (ref 70–99)
Glucose-Capillary: 123 mg/dL — ABNORMAL HIGH (ref 70–99)
Glucose-Capillary: 88 mg/dL (ref 70–99)

## 2021-07-06 LAB — CULTURE, BLOOD (ROUTINE X 2): Special Requests: ADEQUATE

## 2021-07-06 LAB — LEGIONELLA PNEUMOPHILA SEROGP 1 UR AG: L. pneumophila Serogp 1 Ur Ag: NEGATIVE

## 2021-07-06 MED ORDER — MORPHINE SULFATE (PF) 2 MG/ML IV SOLN
1.0000 mg | Freq: Once | INTRAVENOUS | Status: AC
  Administered 2021-07-06: 1 mg via INTRAVENOUS
  Filled 2021-07-06: qty 1

## 2021-07-06 MED ORDER — INSULIN ASPART 100 UNIT/ML IJ SOLN: 0.0000 [IU] | Freq: Three times a day (TID) | INTRAMUSCULAR | Status: DC

## 2021-07-06 MED ORDER — PANTOPRAZOLE SODIUM 40 MG PO TBEC
40.0000 mg | DELAYED_RELEASE_TABLET | Freq: Every day | ORAL | Status: DC
  Administered 2021-07-06: 40 mg via ORAL
  Filled 2021-07-06: qty 1

## 2021-07-06 MED ORDER — INSULIN ASPART 100 UNIT/ML IJ SOLN
0.0000 [IU] | Freq: Three times a day (TID) | INTRAMUSCULAR | Status: DC
  Administered 2021-07-06: 3 [IU] via SUBCUTANEOUS
  Administered 2021-07-06: 1 [IU] via SUBCUTANEOUS

## 2021-07-06 MED ORDER — HYDROCODONE-ACETAMINOPHEN 5-325 MG PO TABS
1.0000 | ORAL_TABLET | ORAL | Status: DC | PRN
  Administered 2021-07-06 – 2021-07-07 (×5): 1 via ORAL
  Filled 2021-07-06 (×5): qty 1

## 2021-07-06 MED ORDER — INSULIN ASPART 100 UNIT/ML IJ SOLN
3.0000 [IU] | Freq: Once | INTRAMUSCULAR | Status: AC
  Administered 2021-07-06: 3 [IU] via SUBCUTANEOUS

## 2021-07-06 MED ORDER — METOCLOPRAMIDE HCL 5 MG PO TABS
5.0000 mg | ORAL_TABLET | Freq: Four times a day (QID) | ORAL | Status: DC | PRN
  Administered 2021-07-07: 5 mg via ORAL
  Filled 2021-07-06: qty 1

## 2021-07-06 NOTE — Progress Notes (Signed)
    OVERNIGHT PROGRESS REPORT  Notified by RN for patient having continued pain. Patient is refusing currently prescribed medications for this.  Regularly requesting Morphine or more frequent dosing of other medications.   Chinita Greenland MSNA MSN ACNPC-AG Acute Care Nurse Practitioner [facility]

## 2021-07-06 NOTE — Progress Notes (Signed)
PROGRESS NOTE    Madison Williams  ELF:[id number] DOB: 1986-06-09 DOA: 07/03/2021 PCP: Charlott Rakes, MD    Brief Narrative:  Madison Williams is a 35 year old female with past medical history significant for type 1 diabetes mellitus, rheumatoid arthritis, gastroparesis, history of tobacco abuse, recent COVID-19 viral infection, history of asthma who presented to [place] ED on 12/4 via EMS for altered mental status.  Patient was found by mother in which she was complaining of abdominal pain for 1 week.  She was noted to have hyperglycemia and was not not able to answer questions effectively.  Patient has been using her insulin but has had multiple missed doses due to her job schedule.  Patient denied headache, no chest pain, no back pain.  Also denied fever/chills but multiple episodes of emesis and diarrhea.  Also reports poor urination for the last 2 days.  In the ED, temperature 96.4 F, HR 107, RR 40, BP 72/47, SPO2 90% on room air.  Sodium 123, potassium 6.9, chloride 80, CO2 less than 7, glucose 1498, BUN 59, creatinine 4.70, calcium 7.1, alkaline phosphatase 137, lipase 45, AST 28, ALT 40, total bilirubin 2.5.  WBC 24.3, hemoglobin 11.8, platelets 304.  Beta hydroxybutyrate acid greater than 8.00.  Hemoglobin A1c 9.6.  hCG negative.  COVID-19 PCR negative.  Influenza A/B PCR negative.  Troponin elevated to 71.  ABG with pH 6.9, PCO2 10.7, PO2 165.  Chest x-ray with no cardiopulmonary disease process.   Assessment & Plan:   Principal Problem:   DKA (diabetic ketoacidosis) ([place]) Active Problems:   Acute renal failure (HCC)   Type 1 diabetes mellitus with hyperglycemia (HCC)   Mild intermittent asthma without complication   Elevated troponin   Rheumatoid arthritis (HCC)   Hyperkalemia   SIRS (systemic inflammatory response syndrome) (HCC)   Septic shock (HCC)   Hypermagnesemia   Hyperphosphatemia   DKA, severe: Resolved Hx type 1 diabetes mellitus with  hyperglycemia Patient presenting to ED after being found by family obtunded.  On arrival to the ED, glucose 1498, hydroxybutyrate acid greater than 8.00 and ABG with pH 6.9.  Hemoglobin A1c 9.6, poorly controlled.  PCCM was consulted and patient was started on DKA protocol with insulin drip.  Patient at baseline takes Levemir 25 units subcutaneously daily with insulin sliding scale during the day.  Patient reports several missed doses of her insulin due to job requirements. --Diabetic educator following, appreciate assistance --Levemir 12u [place] q12h --Sensitive SSI for further coverage --CBGs qAC/HS  Acute renal failure: Resolved Etiology likely secondary to severe dehydration from DKA as above. --Cr 4.70>>>0.96 --Continue to encourage increased oral intake  Hx chronic systolic congestive heart failure; now with improved LVEF Elevated troponin Upon presentation, high sensitive troponin elevated to 71 which peaked at 1445. EKG with nonspecific ST wave changes anterior precordial leads.  Evaluated by cardiology with repeat TTE showing LVEF now 60-65% with no LV regional wall motion abnormalities.  VQ scan negative for PE.  Suspect elevated troponin related to severe acidosis from underlying DKA and not CAD in nature per cardiology. --Continue monitor on telemetry --Continue aspirin 81 mg p.o. daily --Outpatient follow with cardiology  SIRS (systemic inflammatory response syndrome) Patient initially presented with elevated WBC count, hypotension.  Etiology likely secondary to severe acidosis with underlying DKA and severe dehydration.  He was started on broad-spectrum IV antibiotics and IV fluid hydration.  Blood cultures 1/4 positive for Streptococcus mitis/paralysis; suspicious for contaminant.  Urine culture with lactobacillus.  Leukocytosis now  resolved.  Discontinue antibiotic therapy.  Hyperkalemia: Resolved Potassium 6.9 on admission, likely secondary to severe acidosis with DKA and  dehydration.  Resolved with IV insulin.  Potassium 3.8 this morning.  Hyperphosphatemia: Resolved Resolved with IV fluid hydration.  Hypomagnesemia: Resolved Resolved with IV fluid hydration  Hx mild intermittent asthma without complication --Bronchodilators as needed  Hx rheumatoid arthritis Not on therapy outpatient.  ESR 7, within normal limits.  Anxiety --Prozac 20 mg p.o. twice daily --Clonazepam 1 mg p.o. twice daily, 0.5 mg p.o. twice daily as needed  Essential hypertension --Started on amlodipine 2.5 mg p.o. daily per cardiology --Recommend avoiding ARB due to recurrent DKA/prerenal episodes --Aspirin 81 mg p.o. daily --Outpatient follow-up with cardiology, Dr. Angelena Form or APP at [facility] 5 mg IV as needed every 6 hours nausea/vomiting not relieved with Phenergan  Mood disorder: --Lamictal 25 mg p.o. nightly  QTC prolongation: Resolved Repeat EKG this morning with QTC 479. --Continue monitor on telemetry   DVT prophylaxis: enoxaparin (LOVENOX) injection 40 mg Start: 07/05/21 1200   Code Status: Full Code Family Communication: No family present at bedside this morning  Disposition Plan:  Level of care: Telemetry Status is: Inpatient  Remains inpatient appropriate because: Continues with poor oral intake, needs further adjustment of insulin regimen.  Continues with episodes of hypoglycemic and hyperglycemic events following transition from IV insulin.    Consultants:  Cardiology -signed off 12/6 PCCM -signed off 12/6  Procedures:  TTE Nuclear med VQ scan  Antimicrobials:  Cefepime 12/4 - 12/5 Flagyl 12/4 - 12/5 Ceftriaxone 12/4 - 12/7  Vancomycin 12/4 - 12/5   Subjective: Patient seen examined bedside, resting comfortably.  Continues with poor oral intake, states will try to eat a little more today.  Continues with episodes of hypoglycemic and hyperglycemic events following insulin transition from IV.  Continues requests  additional pain medications overnight, specifically morphine IV.  Discussed with patient needs better compliance with insulin regimen given her multiple hospitalizations for recurrent DKA.  No other specific questions or concerns at this time.  Denies headache, no visual changes, no current chest pain, no palpitations, no current nausea/vomiting/diarrhea, no fever/chills/night sweats, no shortness of breath, no abdominal pain, no weakness, no fatigue, no paresthesias.  No acute events overnight per nursing staff.  Objective: Vitals:   07/06/21 0430 07/06/21 0500 07/06/21 0725 07/06/21 0800  BP:   (!) 127/105   Pulse:      Resp: (!) 38 (!) 26 (!) 22   Temp:    98.6 F (37 C)  TempSrc:    Oral  SpO2:      Weight:      Height:        Intake/Output Summary (Last 24 hours) at 07/06/2021 1156 Last data filed at 07/05/2021 2300 Gross per 24 hour  Intake 978.58 ml  Output --  Net 978.58 ml   Filed Weights   07/03/21 1530  Weight: 55.6 kg    Examination:  General exam: Appears calm and comfortable, thin in appearance and appears older than stated age Respiratory system: Clear to auscultation. Respiratory effort normal.  On room air Cardiovascular system: S1 & S2 heard, RRR. No JVD, murmurs, rubs, gallops or clicks. No pedal edema. Gastrointestinal system: Abdomen is nondistended, soft and nontender. No organomegaly or masses felt. Normal bowel sounds heard. Central nervous system: Alert and oriented. No focal neurological deficits. Extremities: Symmetric 5 x 5 power. Skin: No rashes, lesions or ulcers Psychiatry: Judgement and insight appear normal. Mood &  affect appropriate.     Data Reviewed: I have personally reviewed following labs and imaging studies  CBC: Recent Labs  Lab 07/03/21 1025 07/03/21 1050 07/04/21 0243 07/05/21 0254 07/06/21 0304  WBC 24.3*  --  10.6* 6.3 4.2  NEUTROABS 17.4*  --   --   --   --   HGB 11.8* 13.9 10.5* 10.1* 11.0*  HCT 43.2 41.0 30.2* 30.1*  33.4*  MCV 112.5*  --  87.5 89.3 91.5  PLT 304  --  166 132* 017*   Basic Metabolic Panel: Recent Labs  Lab 07/03/21 1448 07/03/21 1812 07/03/21 2147 07/04/21 0243 07/05/21 0254 07/06/21 0304  NA  --  140 144 144  145 147* 141  K  --  4.0 3.6 3.6  3.6 3.1* 3.8  CL  --  102 107 109  109 114* 111  CO2  --  11* 17* 20*  21* 23 20*  GLUCOSE  --  518* 241* 178*  179* 83 282*  BUN  --  53* 51* 48*  48* 30* 21*  CREATININE  --  3.42* 2.60* 2.26*  2.29* 0.98 0.96  CALCIUM  --  7.3* 7.5* 7.4*  7.3* 7.5* 8.0*  MG 3.1*  --   --  2.0  --   --   PHOS 8.4*  --   --  4.0  --   --    GFR: Estimated Creatinine Clearance: 72.5 mL/min (by C-G formula based on SCr of 0.96 mg/dL). Liver Function Tests: Recent Labs  Lab 07/03/21 1026 07/04/21 0243 07/05/21 0254 07/06/21 0304  AST 28 37 78* 50*  ALT 40 36 35 33  ALKPHOS 137* 96 73 80  BILITOT 2.5* 0.9 0.7 0.7  PROT 6.8 6.2* 5.8* 6.2*  ALBUMIN 4.0 3.4* 3.2* 3.4*   Recent Labs  Lab 07/03/21 1026  LIPASE 45   No results for input(s): AMMONIA in the last 168 hours. Coagulation Profile: Recent Labs  Lab 07/04/21 0243  INR 1.1   Cardiac Enzymes: Recent Labs  Lab 07/03/21 1400  CKTOTAL 270*  CKMB 15.5*   BNP (last 3 results) No results for input(s): PROBNP in the last 8760 hours. HbA1C: No results for input(s): HGBA1C in the last 72 hours. CBG: Recent Labs  Lab 07/05/21 2236 07/05/21 2356 07/06/21 0356 07/06/21 0743 07/06/21 1133  GLUCAP 96 138* 286* 226* 244*   Lipid Profile: No results for input(s): CHOL, HDL, LDLCALC, TRIG, CHOLHDL, LDLDIRECT in the last 72 hours. Thyroid Function Tests: No results for input(s): TSH, T4TOTAL, FREET4, T3FREE, THYROIDAB in the last 72 hours. Anemia Panel: No results for input(s): VITAMINB12, FOLATE, FERRITIN, TIBC, IRON, RETICCTPCT in the last 72 hours. Sepsis Labs: Recent Labs  Lab 07/03/21 1045 07/03/21 1251 07/03/21 1448 07/03/21 2234 07/04/21 0243 07/05/21 0254   PROCALCITON  --   --  2.89  --  2.71 0.67  LATICACIDVEN 3.2* 2.1* 1.8 1.5  --   --     Recent Results (from the past 240 hour(s))  Blood culture (routine x 2)     Status: Abnormal   Collection Time: 07/03/21 10:47 AM   Specimen: BLOOD RIGHT ARM  Result Value Ref Range Status   Specimen Description   Final    BLOOD RIGHT ARM Performed at [facility], [place] [address], [place],  [address]    Special Requests   Final    BOTTLES DRAWN AEROBIC AND ANAEROBIC Blood Culture adequate volume Performed at [place] [address] [place], [place] [address]    Culture  Setup Time   Final    GRAM POSITIVE COCCI IN CHAINS ANAEROBIC BOTTLE ONLY CRITICAL RESULT CALLED TO, READ BACK BY AND VERIFIED WITH: PHARM D D.WOFFORD ON [id number] AT 1540 BY E.PARRISH    Culture (A)  Final    STREPTOCOCCUS MITIS/ORALIS THE SIGNIFICANCE OF ISOLATING THIS ORGANISM FROM A SINGLE SET OF BLOOD CULTURES WHEN MULTIPLE SETS ARE DRAWN IS UNCERTAIN. PLEASE NOTIFY THE MICROBIOLOGY DEPARTMENT WITHIN ONE WEEK IF SPECIATION AND SENSITIVITIES ARE REQUIRED. Performed at [facility], [place] [address], [place], [place] [address]    Report Status 07/06/2021 FINAL  Final  Blood Culture ID Panel (Reflexed)     Status: Abnormal   Collection Time: 07/03/21 10:47 AM  Result Value Ref Range Status   Enterococcus faecalis NOT DETECTED NOT DETECTED Final   Enterococcus Faecium NOT DETECTED NOT DETECTED Final   Listeria monocytogenes NOT DETECTED NOT DETECTED Final   Staphylococcus species NOT DETECTED NOT DETECTED Final   Staphylococcus aureus (BCID) NOT DETECTED NOT DETECTED Final   Staphylococcus epidermidis NOT DETECTED NOT DETECTED Final   Staphylococcus lugdunensis NOT DETECTED NOT DETECTED Final   Streptococcus species DETECTED (A) NOT DETECTED Final    Comment: Not Enterococcus species, Streptococcus agalactiae, Streptococcus pyogenes, or Streptococcus pneumoniae. CRITICAL RESULT  CALLED TO, READ BACK BY AND VERIFIED WITH: PHARM D D.WOFFORD ON [id number] AT 7124 BY E.PARRISH    Streptococcus agalactiae NOT DETECTED NOT DETECTED Final   Streptococcus pneumoniae NOT DETECTED NOT DETECTED Final   Streptococcus pyogenes NOT DETECTED NOT DETECTED Final   A.calcoaceticus-baumannii NOT DETECTED NOT DETECTED Final   Bacteroides fragilis NOT DETECTED NOT DETECTED Final   Enterobacterales NOT DETECTED NOT DETECTED Final   Enterobacter cloacae complex NOT DETECTED NOT DETECTED Final   Escherichia coli NOT DETECTED NOT DETECTED Final   Klebsiella aerogenes NOT DETECTED NOT DETECTED Final   Klebsiella oxytoca NOT DETECTED NOT DETECTED Final   Klebsiella pneumoniae NOT DETECTED NOT DETECTED Final   Proteus species NOT DETECTED NOT DETECTED Final   Salmonella species NOT DETECTED NOT DETECTED Final   Serratia marcescens NOT DETECTED NOT DETECTED Final   Haemophilus influenzae NOT DETECTED NOT DETECTED Final   Neisseria meningitidis NOT DETECTED NOT DETECTED Final   Pseudomonas aeruginosa NOT DETECTED NOT DETECTED Final   Stenotrophomonas maltophilia NOT DETECTED NOT DETECTED Final   Candida albicans NOT DETECTED NOT DETECTED Final   Candida auris NOT DETECTED NOT DETECTED Final   Candida glabrata NOT DETECTED NOT DETECTED Final   Candida krusei NOT DETECTED NOT DETECTED Final   Candida parapsilosis NOT DETECTED NOT DETECTED Final   Candida tropicalis NOT DETECTED NOT DETECTED Final   Cryptococcus neoformans/gattii NOT DETECTED NOT DETECTED Final    Comment: Performed at [facility], [address], [place], [place] [address]  Blood culture (routine x 2)     Status: None (Preliminary result)   Collection Time: 07/03/21 11:03 AM   Specimen: BLOOD  Result Value Ref Range Status   Specimen Description   Final    BLOOD RIGHT ANTECUBITAL Performed at [place] [facility] [address] [place], [place] [address]    Special Requests   Final    BOTTLES DRAWN  AEROBIC AND ANAEROBIC Blood Culture adequate volume Performed at [place] [address] [place], [place] [address]    Culture   Final    NO GROWTH 3 DAYS Performed at [facility], [place]  [address], [place], [place] [address]    Report Status PENDING  Incomplete  Resp Panel by RT-PCR (Flu A&B, Covid) Nasopharyngeal Swab     Status: None   Collection Time: 07/03/21 11:16 AM   Specimen: Nasopharyngeal Swab; Nasopharyngeal(NP) swabs in vial transport medium  Result Value Ref Range Status   SARS Coronavirus 2 by RT PCR NEGATIVE NEGATIVE Final    Comment: (NOTE) SARS-CoV-2 target nucleic acids are NOT DETECTED.  The SARS-CoV-2 RNA is generally detectable in upper respiratory specimens during the acute phase of infection. The lowest concentration of SARS-CoV-2 viral copies this assay can detect is 138 copies/mL. A negative result does not preclude SARS-Cov-2 infection and should not be used as the sole basis for treatment or other patient management decisions. A negative result may occur with  improper specimen collection/handling, submission of specimen other than nasopharyngeal swab, presence of viral mutation(s) within the areas targeted by this assay, and inadequate number of viral copies(<138 copies/mL). A negative result must be combined with clinical observations, patient history, and epidemiological information. The expected result is Negative.  Fact Sheet for Patients:  EntrepreneurPulse.com.au  Fact Sheet for Healthcare Providers:  IncredibleEmployment.be  This test is no t yet approved or cleared by the [place] FDA and  has been authorized for detection and/or diagnosis of SARS-CoV-2 by FDA under an Emergency Use Authorization (EUA). This EUA will remain  in effect (meaning this test can be used) for the duration of the COVID-19 declaration under Section 564(b)(1) of the Act, 21 U.S.C.section  360bbb-3(b)(1), unless the authorization is terminated  or revoked sooner.       Influenza A by PCR NEGATIVE NEGATIVE Final   Influenza B by PCR NEGATIVE NEGATIVE Final    Comment: (NOTE) The Xpert Xpress SARS-CoV-2/FLU/RSV plus assay is intended as an aid in the diagnosis of influenza from Nasopharyngeal swab specimens and should not be used as a sole basis for treatment. Nasal washings and aspirates are unacceptable for Xpert Xpress SARS-CoV-2/FLU/RSV testing.  Fact Sheet for Patients: EntrepreneurPulse.com.au  Fact Sheet for Healthcare Providers: IncredibleEmployment.be  This test is not yet approved or cleared by the [place] FDA and has been authorized for detection and/or diagnosis of SARS-CoV-2 by FDA under an Emergency Use Authorization (EUA). This EUA will remain in effect (meaning this test can be used) for the duration of the COVID-19 declaration under Section 564(b)(1) of the Act, 21 U.S.C. section 360bbb-3(b)(1), unless the authorization is terminated or revoked.  Performed at [facility], [place] [address], [place], [place] [address]   Urine Culture     Status: Abnormal   Collection Time: 07/03/21 11:57 AM   Specimen: In/Out Cath Urine  Result Value Ref Range Status   Specimen Description   Final    IN/OUT CATH URINE Performed at [place] [address], [place], [place] [address]    Special Requests   Final    NONE Performed at [facility], [place] [address], [place], [place] [address]    Culture (A)  Final    >=100,000 COLONIES/mL LACTOBACILLUS SPECIES Standardized susceptibility testing for this organism is not available. Performed at [facility], [place] [address] [place], [place] [address]    Report Status 07/04/2021 FINAL  Final  MRSA Next Gen by PCR, Nasal     Status: None   Collection Time: 07/03/21  3:54 PM   Specimen: Nasal Mucosa; Nasal  Swab  Result Value Ref Range Status   MRSA by  PCR Next Gen NOT DETECTED NOT DETECTED Final    Comment: (NOTE) The GeneXpert MRSA Assay (FDA approved for NASAL specimens only), is one component of a comprehensive MRSA colonization surveillance program. It is not intended to diagnose MRSA infection nor to guide or monitor treatment for MRSA infections. Test performance is not FDA approved in patients less than 59 years old. Performed at [facility], [place] [address] [place], [place] [address]          Radiology Studies: NM Pulmonary Perfusion  Result Date: 07/04/2021 CLINICAL DATA:  Chest pain, short of breath EXAM: NUCLEAR MEDICINE PERFUSION LUNG SCAN TECHNIQUE: Perfusion images were obtained in multiple projections after intravenous injection of radiopharmaceutical. RADIOPHARMACEUTICALS:  4.4 mCi Tc-89mMAA COMPARISON:  Chest radiograph 07/03/2021 FINDINGS: No wedge-shaped peripheral perfusion defect within LEFT or RIGHT lung to suggest acute pulmonary embolism. IMPRESSION: No evidence acute pulmonary embolism. Electronically Signed   By: SSuzy BouchardM.D.   On: 07/04/2021 15:25        Scheduled Meds:  amLODipine  2.5 mg Oral Daily   aspirin  81 mg Oral Daily   Chlorhexidine Gluconate Cloth  6 each Topical Daily   clonazePAM  1 mg Oral BID   enoxaparin (LOVENOX) injection  40 mg Subcutaneous Q24H   FLUoxetine  20 mg Oral BID   influenza vac split quadrivalent PF  0.5 mL Intramuscular Tomorrow-1000   insulin aspart  0-5 Units Subcutaneous QHS   insulin detemir  12 Units Subcutaneous BID   lamoTRIgine  25 mg Oral QHS   metoprolol tartrate  25 mg Oral BID   pantoprazole  40 mg Oral QHS   sodium zirconium cyclosilicate  10 g Oral Once   Continuous Infusions:  sodium chloride Stopped (07/05/21 1820)   cefTRIAXone (ROCEPHIN)  IV Stopped (07/05/21 2211)   methocarbamol (ROBAXIN) IV Stopped (07/05/21 1529)   promethazine (PHENERGAN) injection (IM or IVPB)  Stopped (07/05/21 1836)     LOS: 2 days    Time spent: 38 minutes spent on chart review, discussion with nursing staff, consultants, updating family and interview/physical exam; more than 50% of that time was spent in counseling and/or coordination of care.    Anthonio Mizzell J ABritish [place] DO [facility] Available via Epic secure chat 7am-7pm After these hours, please refer to coverage provider listed on [email or web address] 07/06/2021, 11:56 AM

## 2021-07-07 DIAGNOSIS — E875 Hyperkalemia: Secondary | ICD-10-CM | POA: Diagnosis not present

## 2021-07-07 DIAGNOSIS — N170 Acute kidney failure with tubular necrosis: Secondary | ICD-10-CM | POA: Diagnosis not present

## 2021-07-07 DIAGNOSIS — R778 Other specified abnormalities of plasma proteins: Secondary | ICD-10-CM | POA: Diagnosis not present

## 2021-07-07 DIAGNOSIS — E0811 Diabetes mellitus due to underlying condition with ketoacidosis with coma: Secondary | ICD-10-CM | POA: Diagnosis not present

## 2021-07-07 LAB — GLUCOSE, CAPILLARY: Glucose-Capillary: 113 mg/dL — ABNORMAL HIGH (ref 70–99)

## 2021-07-07 MED ORDER — PANTOPRAZOLE SODIUM 40 MG PO TBEC: 40.0000 mg | DELAYED_RELEASE_TABLET | Freq: Every day | ORAL | 2 refills | Status: DC

## 2021-07-07 MED ORDER — AMLODIPINE BESYLATE 2.5 MG PO TABS: 2.5000 mg | ORAL_TABLET | Freq: Every day | ORAL | 2 refills | Status: DC

## 2021-07-07 NOTE — Plan of Care (Signed)
Pt for discharge this afternoon to home with family.  AVS printed and reviewed with patient at bedside.  Follow ups and new medications discussed in detail.  Also reviewed basic DM education including sick day management.  All questions addressed, verbalized understanding.   Problem: Education: Goal: Knowledge of General Education information will improve Description: Including pain rating scale, medication(s)/side effects and non-pharmacologic comfort measures Outcome: Adequate for Discharge   Problem: Clinical Measurements: Goal: Ability to maintain clinical measurements within normal limits will improve Outcome: Adequate for Discharge   Problem: Coping: Goal: Level of anxiety will decrease Outcome: Adequate for Discharge   Problem: Pain Managment: Goal: General experience of comfort will improve Outcome: Adequate for Discharge   Problem: Safety: Goal: Ability to remain free from injury will improve Outcome: Adequate for Discharge

## 2021-07-07 NOTE — Plan of Care (Signed)

## 2021-07-07 NOTE — Progress Notes (Signed)
The patient is injury-free, afebrile, alert, and oriented X 3. The vital signs were within the baseline during this shift. Current pain regiment is effective in controlling her back pain. Nausea is improving with PRN antiemetics. She denies chest pain, SOB, dizziness, signs or symptoms of bleeding, infection, or acute changes during this shift. We will continue to monitor and work toward achieving the care plan goals

## 2021-07-07 NOTE — Discharge Summary (Signed)
Physician Discharge Summary  Madison Williams LOV:[id number] DOB: 25-Jul-1986 DOA: 07/03/2021  PCP: Charlott Rakes, MD  Admit date: 07/03/2021 Discharge date: 07/07/2021  Admitted From: Home Disposition: Home  Recommendations for Outpatient Follow-up:  Follow up with PCP in 1-2 weeks Continue to encourage compliance with her insulin regimen as this is the leading etiology to her recurrent hospitalizations for DKA  Home Health: No Equipment/Devices: None  Discharge Condition: Stable CODE STATUS: Full code Diet recommendation: Heart healthy/consistent carb regular diet  History of present illness:  Madison Williams is a 35 year old female with past medical history significant for type 1 diabetes mellitus, rheumatoid arthritis, gastroparesis, history of tobacco abuse, recent COVID-19 viral infection, history of asthma who presented to [facility] on 12/4 via EMS for altered mental status.  Patient was found by mother in which she was complaining of abdominal pain for 1 week.  She was noted to have hyperglycemia and was not not able to answer questions effectively.  Patient has been using her insulin but has had multiple missed doses due to her job schedule.  Patient denied headache, no chest pain, no back pain.  Also denied fever/chills but multiple episodes of emesis and diarrhea.  Also reports poor urination for the last 2 days.   In the ED, temperature 96.4 F, HR 107, RR 40, BP 72/47, SPO2 90% on room air.  Sodium 123, potassium 6.9, chloride 80, CO2 less than 7, glucose 1498, BUN 59, creatinine 4.70, calcium 7.1, alkaline phosphatase 137, lipase 45, AST 28, ALT 40, total bilirubin 2.5.  WBC 24.3, hemoglobin 11.8, platelets 304.  Beta hydroxybutyrate acid greater than 8.00.  Hemoglobin A1c 9.6.  hCG negative.  COVID-19 PCR negative.  Influenza A/B PCR negative.  Troponin elevated to 71.  ABG with pH 6.9, PCO2 10.7, PO2 165.  Chest x-ray with no cardiopulmonary disease process.  Hospital  course:  DKA, severe: Resolved Hx type 1 diabetes mellitus with hyperglycemia Patient presenting to ED after being found by family obtunded.  On arrival to the ED, glucose 1498, hydroxybutyrate acid greater than 8.00 and ABG with pH 6.9.  Hemoglobin A1c 9.6, poorly controlled.  PCCM was consulted and patient was started on DKA protocol with insulin drip.  Patient at baseline takes Levemir 25 units subcutaneously daily with insulin sliding scale during the day.  Patient reports several missed doses of her insulin due to job requirements.  Patient was titrated off the insulin drip back to subcutaneous insulin with good control of her blood sugar.  Discussed with her extensively during hospitalization that she needs to remain compliant with outpatient with her insulin regimen.  No changes to her home insulin regimen as this remained well controlled during her hospitalization.   Acute renal failure: Resolved Etiology likely secondary to severe dehydration from DKA as above.  Creatinine 4.70 on admission, improved to 0.96 at time of discharge.   Hx chronic systolic congestive heart failure; now with improved LVEF Elevated troponin Upon presentation, high sensitive troponin elevated to 71 which peaked at 1445. EKG with nonspecific ST wave changes anterior precordial leads.  Evaluated by cardiology with repeat TTE showing LVEF now 60-65% with no LV regional wall motion abnormalities.  VQ scan negative for PE.  Suspect elevated troponin related to severe acidosis from underlying DKA and not CAD in nature per cardiology.  Aspirin 81 mg p.o. daily.  Outpatient follow-up with cardiology, Dr. Angelena Form in 3 weeks.   SIRS (systemic inflammatory response syndrome) Patient initially presented with elevated WBC  count, hypotension.  Etiology likely secondary to severe acidosis with underlying DKA and severe dehydration.  He was started on broad-spectrum IV antibiotics and IV fluid hydration.  Blood cultures 1/4 positive  for Streptococcus mitis/paralysis; suspicious for contaminant.  Urine culture with lactobacillus.  Leukocytosis now resolved.  Discontinue antibiotic therapy.   Hyperkalemia: Resolved Potassium 6.9 on admission, likely secondary to severe acidosis with DKA and dehydration.  Resolved with IV insulin.    Hyperphosphatemia: Resolved Resolved with IV fluid hydration.   Hypomagnesemia: Resolved Resolved with IV fluid hydration   Hx mild intermittent asthma without complication Bronchodilators as needed   Hx rheumatoid arthritis Not on therapy outpatient.  ESR 7, within normal limits.   Anxiety Continue home Prozac and Clonazepam   Essential hypertension Started on amlodipine 2.5 mg p.o. daily per cardiology. Recommend avoiding ARB due to recurrent DKA/prerenal episodes. Aspirin 81 mg p.o. daily. Outpatient follow-up with cardiology, Dr. Angelena Form or [facility] at [facility].   Gastroparesis Patient reports intolerant to Reglan.  Continue home Phenergan as needed.  Better glucose control to help alleviate symptoms.   Mood disorder: Lamictal 25 mg p.o. nightly   QTC prolongation: Resolved Repeat EKG this morning with QTC 479.  Discharge Diagnoses:  Principal Problem:   DKA (diabetic ketoacidosis) ([place]) Active Problems:   Acute renal failure (HCC)   Type 1 diabetes mellitus with hyperglycemia (HCC)   Mild intermittent asthma without complication   Elevated troponin   Rheumatoid arthritis (HCC)   Hyperkalemia   SIRS (systemic inflammatory response syndrome) (HCC)   Septic shock (HCC)   Hypermagnesemia   Hyperphosphatemia    Discharge Instructions  Discharge Instructions     Call MD for:  difficulty breathing, headache or visual disturbances   Complete by: As directed    Call MD for:  extreme fatigue   Complete by: As directed    Call MD for:  persistant dizziness or light-headedness   Complete by: As directed    Call MD for:  persistant nausea and vomiting   Complete by:  As directed    Call MD for:  severe uncontrolled pain   Complete by: As directed    Call MD for:  temperature >100.4   Complete by: As directed    Diet - low sodium heart healthy   Complete by: As directed    Increase activity slowly   Complete by: As directed       Allergies as of 07/07/2021   No Known Allergies      Medication List     STOP taking these medications    benzonatate 100 MG capsule Commonly known as: TESSALON   metoCLOPramide 5 MG tablet Commonly known as: REGLAN       TAKE these medications    amLODipine 2.5 MG tablet Commonly known as: NORVASC Take 1 tablet (2.5 mg total) by mouth daily.   clonazePAM 1 MG tablet Commonly known as: KLONOPIN Take 1 mg by mouth 2 (two) times daily as needed for anxiety.   FLUoxetine 20 MG capsule Commonly known as: PROZAC Take 20 mg by mouth 2 (two) times daily.   gabapentin 300 MG capsule Commonly known as: NEURONTIN Take 300-600 mg by mouth 2 (two) times daily as needed (pain).   HumaLOG 100 UNIT/ML injection Generic drug: insulin lispro Inject 0-9 units into the skin three times daily with meals by sliding scale What changed:  how much to take when to take this additional instructions   ibuprofen 200 MG tablet Commonly known as:  ADVIL Take 200 mg by mouth every 6 (six) hours as needed for moderate pain.   Insulin Syringes (Disposable) U-100 0.5 ML Misc 1 each by Does not apply route 4 (four) times daily - after meals and at bedtime.   lamoTRIgine 25 MG tablet Commonly known as: LAMICTAL Take 50 mg by mouth at bedtime.   Lantus 100 UNIT/ML injection Generic drug: insulin glargine Inject 0.25 mLs (25 Units total) into the skin daily.   metoprolol succinate 25 MG 24 hr tablet Commonly known as: TOPROL-XL Take 25 mg by mouth daily.   pantoprazole 40 MG tablet Commonly known as: PROTONIX Take 1 tablet (40 mg total) by mouth at bedtime.   promethazine 25 MG tablet Commonly known as:  PHENERGAN Take 1 tablet (25 mg total) by mouth every 6 (six) hours as needed for nausea or vomiting.        No Known Allergies  Consultations: PCCM Cardiology   Procedures/Studies: NM Pulmonary Perfusion  Result Date: 07/04/2021 CLINICAL DATA:  Chest pain, short of breath EXAM: NUCLEAR MEDICINE PERFUSION LUNG SCAN TECHNIQUE: Perfusion images were obtained in multiple projections after intravenous injection of radiopharmaceutical. RADIOPHARMACEUTICALS:  4.4 mCi Tc-33mMAA COMPARISON:  Chest radiograph 07/03/2021 FINDINGS: No wedge-shaped peripheral perfusion defect within LEFT or RIGHT lung to suggest acute pulmonary embolism. IMPRESSION: No evidence acute pulmonary embolism. Electronically Signed   By: SSuzy BouchardM.D.   On: 07/04/2021 15:25   DG Chest Portable 1 View  Result Date: 07/03/2021 CLINICAL DATA:  Altered mental status. EXAM: PORTABLE CHEST 1 VIEW COMPARISON:  06/19/2021 FINDINGS: 1132 hours. Rightward patient rotation. The lungs are clear without focal pneumonia, edema, pneumothorax or pleural effusion. The cardiopericardial silhouette is within normal limits for size. The visualized bony structures of the thorax show no acute abnormality. Telemetry leads overlie the chest. IMPRESSION: No active disease. Electronically Signed   By: EMisty StanleyM.D.   On: 07/03/2021 12:11   ECHOCARDIOGRAM COMPLETE  Result Date: 07/04/2021    ECHOCARDIOGRAM REPORT   Patient Name:   DVANNESA ABAIRSTEVENS Date of Exam: 07/04/2021 Medical Rec #:  [id number]            Height:       67.0 in Accession #:    [id number]           Weight:       122.6 lb Date of Birth:  [birth year]             BSA:          1.642 m Patient Age:    89years              BP:           119/77 mmHg Patient Gender: F                     HR:           86 bpm. Exam Location:  Inpatient Procedure: 2D Echo, 3D Echo, Cardiac Doppler and Color Doppler Indications:    R06.02 SOB. Respiratory distress.  History:        Patient has  prior history of Echocardiogram examinations, most                 recent 11/25/2019. Abnormal ECG, Aortic Valve Disease,                 Signs/Symptoms:Chest Pain, Shortness of Breath and Dyspnea; Risk  Factors:Diabetes and Current Smoker. Substance withdrawal.                 Moderate AI.  Sonographer:    Roseanna Rainbow RDCS Referring Phys: [phone] [facility]  Sonographer Comments: Technically difficult study due to poor echo windows. Image acquisition challenging due to uncooperative patient. Study interrrupted for meds. Patient pushed probe away in subcostals. IMPRESSIONS  1. Left ventricular ejection fraction, by estimation, is 60 to 65%. Left ventricular ejection fraction by 3D volume is 64 %. The left ventricle has normal function. The left ventricle has no regional wall motion abnormalities. Left ventricular diastolic  parameters were normal.  2. Right ventricular systolic function is normal. The right ventricular size is normal.  3. The mitral valve is normal in structure. Trivial mitral valve regurgitation. No evidence of mitral stenosis.  4. The aortic valve was not well visualized. Aortic valve regurgitation is mild. No aortic stenosis is present. FINDINGS  Left Ventricle: Left ventricular ejection fraction, by estimation, is 60 to 65%. Left ventricular ejection fraction by 3D volume is 64 %. The left ventricle has normal function. The left ventricle has no regional wall motion abnormalities. The left ventricular internal cavity size was normal in size. There is no left ventricular hypertrophy. Left ventricular diastolic parameters were normal. Right Ventricle: The right ventricular size is normal. No increase in right ventricular wall thickness. Right ventricular systolic function is normal. The tricuspid regurgitant velocity is 2.37 m/s, and with an assumed right atrial pressure of 3 mmHg, the estimated right ventricular systolic pressure is 96.0 mmHg. Left Atrium: Left atrial size was  normal in size. Right Atrium: Right atrial size was normal in size. Pericardium: There is no evidence of pericardial effusion. Mitral Valve: The mitral valve is normal in structure. Trivial mitral valve regurgitation. No evidence of mitral valve stenosis. Tricuspid Valve: The tricuspid valve is normal in structure. Tricuspid valve regurgitation is trivial. Aortic Valve: The aortic valve was not well visualized. Aortic valve regurgitation is mild. No aortic stenosis is present. Pulmonic Valve: The pulmonic valve was not well visualized. Pulmonic valve regurgitation is not visualized. Aorta: The aortic root is normal in size and structure. IAS/Shunts: The interatrial septum was not well visualized.  LEFT VENTRICLE PLAX 2D LVIDd:         4.20 cm         Diastology LVIDs:         2.80 cm         LV e' medial:    13.90 cm/s LV PW:         0.80 cm         LV E/e' medial:  6.5 LV IVS:        0.90 cm         LV e' lateral:   15.30 cm/s LVOT diam:     1.90 cm         LV E/e' lateral: 5.9 LV SV:         66 LV SV Index:   40 LVOT Area:     2.84 cm        3D Volume EF                                LV 3D EF:    Left  ventricul LV Volumes (MOD)                            ar LV vol d, MOD    77.5 ml                    ejection A2C:                                        fraction LV vol d, MOD    76.0 ml                    by 3D A4C:                                        volume is LV vol s, MOD    34.4 ml                    64 %. A2C: LV vol s, MOD    26.3 ml A4C:                           3D Volume EF: LV SV MOD A2C:   43.1 ml       3D EF:        64 % LV SV MOD A4C:   76.0 ml       LV EDV:       122 ml LV SV MOD BP:    45.5 ml       LV ESV:       44 ml                                LV SV:        78 ml RIGHT VENTRICLE RV S prime:     14.50 cm/s TAPSE (M-mode): 1.6 cm LEFT ATRIUM             Index        RIGHT ATRIUM          Index LA diam:        2.50 cm 1.52 cm/m   RA Area:     9.91  cm LA Vol (A2C):   42.5 ml 25.88 ml/m  RA Volume:   20.80 ml 12.66 ml/m LA Vol (A4C):   22.6 ml 13.76 ml/m LA Biplane Vol: 32.3 ml 19.67 ml/m  AORTIC VALVE LVOT Vmax:   136.00 cm/s LVOT Vmean:  90.800 cm/s LVOT VTI:    0.232 m  AORTA Ao Root diam: 3.40 cm MITRAL VALVE               TRICUSPID VALVE MV Area (PHT): 4.39 cm    TR Peak grad:   22.5 mmHg MV Decel Time: 173 msec    TR Vmax:        237.00 cm/s MV E velocity: 90.00 cm/s MV A velocity: 62.60 cm/s  SHUNTS MV E/A ratio:  1.44        Systemic VTI:  0.23 m  Systemic Diam: 1.90 cm Oswaldo Milian MD Electronically signed by Oswaldo Milian MD Signature Date/Time: 07/04/2021/10:26:27 AM    Final      Subjective: Patient seen examined bedside, resting comfortably.  Tolerating diet.  Glucose remains well controlled on current insulin regimen.  Discussed with her once again needs compliance with Ensure no recurrent episodes of DKA.  Ready for discharge home.  Denies headache, no visual changes, no chest pain, palpitations, shortness of breath, no abdominal pain, no weakness, no fatigue, no paresthesias.  No acute events overnight per nursing staff.  Discharge Exam: Vitals:   07/06/21 2119 07/07/21 0415  BP: 123/90 114/87  Pulse: 91 84  Resp: 19 17  Temp: 100.3 F (37.9 C) (!) 97.3 F (36.3 C)  SpO2: 96% 93%   Vitals:   07/06/21 1415 07/06/21 1530 07/06/21 2119 07/07/21 0415  BP:   123/90 114/87  Pulse:   91 84  Resp: (!) '30  19 17  ' Temp:  98.4 F (36.9 C) 100.3 F (37.9 C) (!) 97.3 F (36.3 C)  TempSrc:  Oral    SpO2:   96% 93%  Weight:      Height:        General: Pt is alert, awake, not in acute distress, appears older than stated age Cardiovascular: RRR, S1/S2 +, no rubs, no gallops Respiratory: CTA bilaterally, no wheezing, no rhonchi, on room air Abdominal: Soft, NT, ND, bowel sounds + Extremities: no edema, no cyanosis    The results of significant diagnostics from this  hospitalization (including imaging, microbiology, ancillary and laboratory) are listed below for reference.     Microbiology: Recent Results (from the past 240 hour(s))  Blood culture (routine x 2)     Status: Abnormal   Collection Time: 07/03/21 10:47 AM   Specimen: BLOOD RIGHT ARM  Result Value Ref Range Status   Specimen Description   Final    BLOOD RIGHT ARM Performed at [facility], [address] [place], [place] [address]    Special Requests   Final    BOTTLES DRAWN AEROBIC AND ANAEROBIC Blood Culture adequate volume Performed at [facility] [address] [place], [place] [address]    Culture  Setup Time   Final    GRAM POSITIVE COCCI IN CHAINS ANAEROBIC BOTTLE ONLY CRITICAL RESULT CALLED TO, READ BACK BY AND VERIFIED WITH: PHARM D D.WOFFORD ON [id number] AT [id number] BY E.PARRISH    Culture (A)  Final    STREPTOCOCCUS MITIS/ORALIS THE SIGNIFICANCE OF ISOLATING THIS ORGANISM FROM A SINGLE SET OF BLOOD CULTURES WHEN MULTIPLE SETS ARE DRAWN IS UNCERTAIN. PLEASE NOTIFY THE MICROBIOLOGY DEPARTMENT WITHIN ONE WEEK IF SPECIATION AND SENSITIVITIES ARE REQUIRED. Performed at [facility], [place] [address], [place], [place] [address]    Report Status 07/06/2021 FINAL  Final  Blood Culture ID Panel (Reflexed)     Status: Abnormal   Collection Time: 07/03/21 10:47 AM  Result Value Ref Range Status   Enterococcus faecalis NOT DETECTED NOT DETECTED Final   Enterococcus Faecium NOT DETECTED NOT DETECTED Final   Listeria monocytogenes NOT DETECTED NOT DETECTED Final   Staphylococcus species NOT DETECTED NOT DETECTED Final   Staphylococcus aureus (BCID) NOT DETECTED NOT DETECTED Final   Staphylococcus epidermidis NOT DETECTED NOT DETECTED Final   Staphylococcus lugdunensis NOT DETECTED NOT DETECTED Final   Streptococcus species DETECTED (A) NOT DETECTED Final    Comment: Not Enterococcus species, Streptococcus agalactiae, Streptococcus pyogenes, or  Streptococcus pneumoniae. CRITICAL RESULT CALLED  TO, READ BACK BY AND VERIFIED WITH: PHARM D D.WOFFORD ON [id number] AT 1607 BY E.PARRISH    Streptococcus agalactiae NOT DETECTED NOT DETECTED Final   Streptococcus pneumoniae NOT DETECTED NOT DETECTED Final   Streptococcus pyogenes NOT DETECTED NOT DETECTED Final   A.calcoaceticus-baumannii NOT DETECTED NOT DETECTED Final   Bacteroides fragilis NOT DETECTED NOT DETECTED Final   Enterobacterales NOT DETECTED NOT DETECTED Final   Enterobacter cloacae complex NOT DETECTED NOT DETECTED Final   Escherichia coli NOT DETECTED NOT DETECTED Final   Klebsiella aerogenes NOT DETECTED NOT DETECTED Final   Klebsiella oxytoca NOT DETECTED NOT DETECTED Final   Klebsiella pneumoniae NOT DETECTED NOT DETECTED Final   Proteus species NOT DETECTED NOT DETECTED Final   Salmonella species NOT DETECTED NOT DETECTED Final   Serratia marcescens NOT DETECTED NOT DETECTED Final   Haemophilus influenzae NOT DETECTED NOT DETECTED Final   Neisseria meningitidis NOT DETECTED NOT DETECTED Final   Pseudomonas aeruginosa NOT DETECTED NOT DETECTED Final   Stenotrophomonas maltophilia NOT DETECTED NOT DETECTED Final   Candida albicans NOT DETECTED NOT DETECTED Final   Candida auris NOT DETECTED NOT DETECTED Final   Candida glabrata NOT DETECTED NOT DETECTED Final   Candida krusei NOT DETECTED NOT DETECTED Final   Candida parapsilosis NOT DETECTED NOT DETECTED Final   Candida tropicalis NOT DETECTED NOT DETECTED Final   Cryptococcus neoformans/gattii NOT DETECTED NOT DETECTED Final    Comment: Performed at [facility], [address], [place], [place] [address]  Blood culture (routine x 2)     Status: None (Preliminary result)   Collection Time: 07/03/21 11:03 AM   Specimen: BLOOD  Result Value Ref Range Status   Specimen Description   Final    BLOOD RIGHT ANTECUBITAL Performed at [facility] [address], [place], [place] [address]     Special Requests   Final    BOTTLES DRAWN AEROBIC AND ANAEROBIC Blood Culture adequate volume Performed at [facility] [address] [place], [place] [address]    Culture   Final    NO GROWTH 3 DAYS Performed at [facility], [place] [address], [place], [place] [address]    Report Status PENDING  Incomplete  Resp Panel by RT-PCR (Flu A&B, Covid) Nasopharyngeal Swab     Status: None   Collection Time: 07/03/21 11:16 AM   Specimen: Nasopharyngeal Swab; Nasopharyngeal(NP) swabs in vial transport medium  Result Value Ref Range Status   SARS Coronavirus 2 by RT PCR NEGATIVE NEGATIVE Final    Comment: (NOTE) SARS-CoV-2 target nucleic acids are NOT DETECTED.  The SARS-CoV-2 RNA is generally detectable in upper respiratory specimens during the acute phase of infection. The lowest concentration of SARS-CoV-2 viral copies this assay can detect is 138 copies/mL. A negative result does not preclude SARS-Cov-2 infection and should not be used as the sole basis for treatment or other patient management decisions. A negative result may occur with  improper specimen collection/handling, submission of specimen other than nasopharyngeal swab, presence of viral mutation(s) within the areas targeted by this assay, and inadequate number of viral copies(<138 copies/mL). A negative result must be combined with clinical observations, patient history, and epidemiological information. The expected result is Negative.  Fact Sheet for Patients:  EntrepreneurPulse.com.au  Fact Sheet for Healthcare Providers:  IncredibleEmployment.be  This test is no t yet approved or cleared by the [place] FDA and  has been authorized for detection and/or diagnosis of SARS-CoV-2 by FDA under  an Emergency Use Authorization (EUA). This EUA will remain  in effect (meaning this test can be used) for the duration of the COVID-19 declaration under Section  564(b)(1) of the Act, 21 U.S.C.section 360bbb-3(b)(1), unless the authorization is terminated  or revoked sooner.       Influenza A by PCR NEGATIVE NEGATIVE Final   Influenza B by PCR NEGATIVE NEGATIVE Final    Comment: (NOTE) The Xpert Xpress SARS-CoV-2/FLU/RSV plus assay is intended as an aid in the diagnosis of influenza from Nasopharyngeal swab specimens and should not be used as a sole basis for treatment. Nasal washings and aspirates are unacceptable for Xpert Xpress SARS-CoV-2/FLU/RSV testing.  Fact Sheet for Patients: EntrepreneurPulse.com.au  Fact Sheet for Healthcare Providers: IncredibleEmployment.be  This test is not yet approved or cleared by the [place] FDA and has been authorized for detection and/or diagnosis of SARS-CoV-2 by FDA under an Emergency Use Authorization (EUA). This EUA will remain in effect (meaning this test can be used) for the duration of the COVID-19 declaration under Section 564(b)(1) of the Act, 21 U.S.C. section 360bbb-3(b)(1), unless the authorization is terminated or revoked.  Performed at [facility], [place] [address], [place], [place] [address]   Urine Culture     Status: Abnormal   Collection Time: 07/03/21 11:57 AM   Specimen: In/Out Cath Urine  Result Value Ref Range Status   Specimen Description   Final    IN/OUT CATH URINE Performed at [facility] [address], [place], [place] [address]    Special Requests   Final    NONE Performed at [facility], [place] [address], [place], [place] [address]    Culture (A)  Final    >=100,000 COLONIES/mL LACTOBACILLUS SPECIES Standardized susceptibility testing for this organism is not available. Performed at [facility], [place] [address], [place], [place] [address]    Report Status 07/04/2021 FINAL  Final  MRSA Next Gen by PCR, Nasal     Status: None   Collection Time: 07/03/21  3:54  PM   Specimen: Nasal Mucosa; Nasal Swab  Result Value Ref Range Status   MRSA by PCR Next Gen NOT DETECTED NOT DETECTED Final    Comment: (NOTE) The GeneXpert MRSA Assay (FDA approved for NASAL specimens only), is one component of a comprehensive MRSA colonization surveillance program. It is not intended to diagnose MRSA infection nor to guide or monitor treatment for MRSA infections. Test performance is not FDA approved in patients less than 78 years old. Performed at [facility], [place] [address], [place],  [address]      Labs: BNP (last 3 results) No results for input(s): BNP in the last 8760 hours. Basic Metabolic Panel: Recent Labs  Lab 07/03/21 1448 07/03/21 1812 07/03/21 2147 07/04/21 0243 07/05/21 0254 07/06/21 0304  NA  --  140 144 144  145 147* 141  K  --  4.0 3.6 3.6  3.6 3.1* 3.8  CL  --  102 107 109  109 114* 111  CO2  --  11* 17* 20*  21* 23 20*  GLUCOSE  --  518* 241* 178*  179* 83 282*  BUN  --  53* 51* 48*  48* 30* 21*  CREATININE  --  3.42* 2.60* 2.26*  2.29* 0.98 0.96  CALCIUM  --  7.3* 7.5* 7.4*  7.3* 7.5* 8.0*  MG 3.1*  --   --  2.0  --   --  PHOS 8.4*  --   --  4.0  --   --    Liver Function Tests: Recent Labs  Lab 07/03/21 1026 07/04/21 0243 07/05/21 0254 07/06/21 0304  AST 28 37 78* 50*  ALT 40 36 35 33  ALKPHOS 137* 96 73 80  BILITOT 2.5* 0.9 0.7 0.7  PROT 6.8 6.2* 5.8* 6.2*  ALBUMIN 4.0 3.4* 3.2* 3.4*   Recent Labs  Lab 07/03/21 1026  LIPASE 45   No results for input(s): AMMONIA in the last 168 hours. CBC: Recent Labs  Lab 07/03/21 1025 07/03/21 1050 07/04/21 0243 07/05/21 0254 07/06/21 0304  WBC 24.3*  --  10.6* 6.3 4.2  NEUTROABS 17.4*  --   --   --   --   HGB 11.8* 13.9 10.5* 10.1* 11.0*  HCT 43.2 41.0 30.2* 30.1* 33.4*  MCV 112.5*  --  87.5 89.3 91.5  PLT 304  --  166 132* 105*   Cardiac Enzymes: Recent Labs  Lab 07/03/21 1400  CKTOTAL 270*  CKMB 15.5*   BNP: Invalid  input(s): POCBNP CBG: Recent Labs  Lab 07/06/21 0743 07/06/21 1133 07/06/21 1638 07/06/21 2121 07/07/21 0822  GLUCAP 226* 244* 123* 88 113*   D-Dimer No results for input(s): DDIMER in the last 72 hours. Hgb A1c No results for input(s): HGBA1C in the last 72 hours. Lipid Profile No results for input(s): CHOL, HDL, LDLCALC, TRIG, CHOLHDL, LDLDIRECT in the last 72 hours. Thyroid function studies No results for input(s): TSH, T4TOTAL, T3FREE, THYROIDAB in the last 72 hours.  Invalid input(s): FREET3 Anemia work up No results for input(s): VITAMINB12, FOLATE, FERRITIN, TIBC, IRON, RETICCTPCT in the last 72 hours. Urinalysis    Component Value Date/Time   COLORURINE YELLOW 07/03/2021 1157   APPEARANCEUR CLOUDY (A) 07/03/2021 1157   LABSPEC 1.015 07/03/2021 1157   PHURINE 5.0 07/03/2021 1157   GLUCOSEU >=500 (A) 07/03/2021 1157   HGBUR SMALL (A) 07/03/2021 1157   BILIRUBINUR NEGATIVE 07/03/2021 1157   KETONESUR 20 (A) 07/03/2021 1157   PROTEINUR 30 (A) 07/03/2021 1157   NITRITE NEGATIVE 07/03/2021 1157   LEUKOCYTESUR TRACE (A) 07/03/2021 1157   Sepsis Labs Invalid input(s): PROCALCITONIN,  WBC,  LACTICIDVEN Microbiology Recent Results (from the past 240 hour(s))  Blood culture (routine x 2)     Status: Abnormal   Collection Time: 07/03/21 10:47 AM   Specimen: BLOOD RIGHT ARM  Result Value Ref Range Status   Specimen Description   Final    BLOOD RIGHT ARM Performed at [facility], [place] [address], [place], [place] [address]    Special Requests   Final    BOTTLES DRAWN AEROBIC AND ANAEROBIC Blood Culture adequate volume Performed at [facility] [address] [place], [place] [address]    Culture  Setup Time   Final    GRAM POSITIVE COCCI IN CHAINS ANAEROBIC BOTTLE ONLY CRITICAL RESULT CALLED TO, READ BACK BY AND VERIFIED WITH: PHARM D D.WOFFORD ON [id number] AT [id number] BY E.PARRISH    Culture (A)  Final    STREPTOCOCCUS MITIS/ORALIS THE  SIGNIFICANCE OF ISOLATING THIS ORGANISM FROM A SINGLE SET OF BLOOD CULTURES WHEN MULTIPLE SETS ARE DRAWN IS UNCERTAIN. PLEASE NOTIFY THE MICROBIOLOGY DEPARTMENT WITHIN ONE WEEK IF SPECIATION AND SENSITIVITIES ARE REQUIRED. Performed at [facility], [place] [address] [place],  [address]    Report Status 07/06/2021 FINAL  Final  Blood Culture ID Panel (Reflexed)     Status: Abnormal   Collection Time:  07/03/21 10:47 AM  Result Value Ref Range Status   Enterococcus faecalis NOT DETECTED NOT DETECTED Final   Enterococcus Faecium NOT DETECTED NOT DETECTED Final   Listeria monocytogenes NOT DETECTED NOT DETECTED Final   Staphylococcus species NOT DETECTED NOT DETECTED Final   Staphylococcus aureus (BCID) NOT DETECTED NOT DETECTED Final   Staphylococcus epidermidis NOT DETECTED NOT DETECTED Final   Staphylococcus lugdunensis NOT DETECTED NOT DETECTED Final   Streptococcus species DETECTED (A) NOT DETECTED Final    Comment: Not Enterococcus species, Streptococcus agalactiae, Streptococcus pyogenes, or Streptococcus pneumoniae. CRITICAL RESULT CALLED TO, READ BACK BY AND VERIFIED WITH: PHARM D D.WOFFORD ON [id number] AT [id number] BY E.PARRISH    Streptococcus agalactiae NOT DETECTED NOT DETECTED Final   Streptococcus pneumoniae NOT DETECTED NOT DETECTED Final   Streptococcus pyogenes NOT DETECTED NOT DETECTED Final   A.calcoaceticus-baumannii NOT DETECTED NOT DETECTED Final   Bacteroides fragilis NOT DETECTED NOT DETECTED Final   Enterobacterales NOT DETECTED NOT DETECTED Final   Enterobacter cloacae complex NOT DETECTED NOT DETECTED Final   Escherichia coli NOT DETECTED NOT DETECTED Final   Klebsiella aerogenes NOT DETECTED NOT DETECTED Final   Klebsiella oxytoca NOT DETECTED NOT DETECTED Final   Klebsiella pneumoniae NOT DETECTED NOT DETECTED Final   Proteus species NOT DETECTED NOT DETECTED Final   Salmonella species NOT DETECTED NOT DETECTED Final   Serratia marcescens NOT DETECTED  NOT DETECTED Final   Haemophilus influenzae NOT DETECTED NOT DETECTED Final   Neisseria meningitidis NOT DETECTED NOT DETECTED Final   Pseudomonas aeruginosa NOT DETECTED NOT DETECTED Final   Stenotrophomonas maltophilia NOT DETECTED NOT DETECTED Final   Candida albicans NOT DETECTED NOT DETECTED Final   Candida auris NOT DETECTED NOT DETECTED Final   Candida glabrata NOT DETECTED NOT DETECTED Final   Candida krusei NOT DETECTED NOT DETECTED Final   Candida parapsilosis NOT DETECTED NOT DETECTED Final   Candida tropicalis NOT DETECTED NOT DETECTED Final   Cryptococcus neoformans/gattii NOT DETECTED NOT DETECTED Final    Comment: Performed at [facility], [address] [place], [place] [address]  Blood culture (routine x 2)     Status: None (Preliminary result)   Collection Time: 07/03/21 11:03 AM   Specimen: BLOOD  Result Value Ref Range Status   Specimen Description   Final    BLOOD RIGHT ANTECUBITAL Performed at [facility] [address], [place], [place] [address]    Special Requests   Final    BOTTLES DRAWN AEROBIC AND ANAEROBIC Blood Culture adequate volume Performed at [facility] [address] [place], [place] [address]    Culture   Final    NO GROWTH 3 DAYS Performed at [facility], [place] [address], [place], [place] [address]    Report Status PENDING  Incomplete  Resp Panel by RT-PCR (Flu A&B, Covid) Nasopharyngeal Swab     Status: None   Collection Time: 07/03/21 11:16 AM   Specimen: Nasopharyngeal Swab; Nasopharyngeal(NP) swabs in vial transport medium  Result Value Ref Range Status   SARS Coronavirus 2 by RT PCR NEGATIVE NEGATIVE Final    Comment: (NOTE) SARS-CoV-2 target nucleic acids are NOT DETECTED.  The SARS-CoV-2 RNA is generally detectable in upper respiratory specimens during the acute phase of infection. The lowest concentration of SARS-CoV-2 viral copies this assay can detect is 138 copies/mL. A  negative result does not preclude SARS-Cov-2 infection and should not be used as the sole basis for treatment or other patient  management decisions. A negative result may occur with  improper specimen collection/handling, submission of specimen other than nasopharyngeal swab, presence of viral mutation(s) within the areas targeted by this assay, and inadequate number of viral copies(<138 copies/mL). A negative result must be combined with clinical observations, patient history, and epidemiological information. The expected result is Negative.  Fact Sheet for Patients:  EntrepreneurPulse.com.au  Fact Sheet for Healthcare Providers:  IncredibleEmployment.be  This test is no t yet approved or cleared by the [place] FDA and  has been authorized for detection and/or diagnosis of SARS-CoV-2 by FDA under an Emergency Use Authorization (EUA). This EUA will remain  in effect (meaning this test can be used) for the duration of the COVID-19 declaration under Section 564(b)(1) of the Act, 21 U.S.C.section 360bbb-3(b)(1), unless the authorization is terminated  or revoked sooner.       Influenza A by PCR NEGATIVE NEGATIVE Final   Influenza B by PCR NEGATIVE NEGATIVE Final    Comment: (NOTE) The Xpert Xpress SARS-CoV-2/FLU/RSV plus assay is intended as an aid in the diagnosis of influenza from Nasopharyngeal swab specimens and should not be used as a sole basis for treatment. Nasal washings and aspirates are unacceptable for Xpert Xpress SARS-CoV-2/FLU/RSV testing.  Fact Sheet for Patients: EntrepreneurPulse.com.au  Fact Sheet for Healthcare Providers: IncredibleEmployment.be  This test is not yet approved or cleared by the [place] FDA and has been authorized for detection and/or diagnosis of SARS-CoV-2 by FDA under an Emergency Use Authorization (EUA). This EUA will remain in effect (meaning this test can  be used) for the duration of the COVID-19 declaration under Section 564(b)(1) of the Act, 21 U.S.C. section 360bbb-3(b)(1), unless the authorization is terminated or revoked.  Performed at [facility], [place] [address] [place], [place] [address]   Urine Culture     Status: Abnormal   Collection Time: 07/03/21 11:57 AM   Specimen: In/Out Cath Urine  Result Value Ref Range Status   Specimen Description   Final    IN/OUT CATH URINE Performed at [facility] [address], [place], [place] [address]    Special Requests   Final    NONE Performed at [facility], [place] [address], [place], [place] [address]    Culture (A)  Final    >=100,000 COLONIES/mL LACTOBACILLUS SPECIES Standardized susceptibility testing for this organism is not available. Performed at [facility], [place] [address] [place], [place] [address]    Report Status 07/04/2021 FINAL  Final  MRSA Next Gen by PCR, Nasal     Status: None   Collection Time: 07/03/21  3:54 PM   Specimen: Nasal Mucosa; Nasal Swab  Result Value Ref Range Status   MRSA by PCR Next Gen NOT DETECTED NOT DETECTED Final    Comment: (NOTE) The GeneXpert MRSA Assay (FDA approved for NASAL specimens only), is one component of a comprehensive MRSA colonization surveillance program. It is not intended to diagnose MRSA infection nor to guide or monitor treatment for MRSA infections. Test performance is not FDA approved in patients less than 39 years old. Performed at [facility], [place] [address] [place], [place] [address]      Time coordinating discharge: Over 30 minutes  SIGNED:   Derriana Oser J [place] DO  [facility] 07/07/2021, 9:14 AM

## 2021-07-08 ENCOUNTER — Telehealth: Payer: Self-pay

## 2021-07-08 LAB — CULTURE, BLOOD (ROUTINE X 2)
Culture: NO GROWTH
Special Requests: ADEQUATE

## 2021-07-08 NOTE — Telephone Encounter (Signed)
Transition Care Management Unsuccessful Follow-up Telephone Call  Date of discharge and from where:  [facility]  Attempts:  1st Attempt  Reason for unsuccessful TCM follow-up call:  Left voice message unable to reach pt at this time.

## 2021-07-10 LAB — URINE DRUGS OF ABUSE SCREEN W ALC, ROUTINE (REF LAB)
Amphetamines, Urine: NEGATIVE ng/mL
Barbiturate, Ur: NEGATIVE ng/mL
Benzodiazepine Quant, Ur: NEGATIVE ng/mL
Cannabinoid Quant, Ur: NEGATIVE ng/mL
Cocaine (Metab.): NEGATIVE ng/mL
Ethanol U, Quan: NEGATIVE %
Methadone Screen, Urine: NEGATIVE ng/mL
Phencyclidine, Ur: NEGATIVE ng/mL
Propoxyphene, Urine: NEGATIVE ng/mL

## 2021-07-10 LAB — OPIATES CONFIRMATION, URINE: OPIATES: NEGATIVE

## 2021-07-11 ENCOUNTER — Telehealth: Payer: Self-pay

## 2021-07-11 NOTE — Telephone Encounter (Signed)
Transition Care Management Unsuccessful Follow-up Telephone Call  Date of discharge and from where:  07/07/2021, [facility]   Attempts:  2nd Attempt  Reason for unsuccessful TCM follow-up call:  Left voice message on # [phone]. Call placed to # [phone], voicemail not set up.  Patient has appointment with Dr Alvis Lemmings 07/20/2021.

## 2021-07-12 ENCOUNTER — Telehealth: Payer: Self-pay

## 2021-07-12 ENCOUNTER — Encounter: Payer: Self-pay | Admitting: Family Medicine

## 2021-07-12 NOTE — Telephone Encounter (Signed)
Transition Care Management Unsuccessful Follow-up Telephone Call  Date of discharge and from where:  07/07/2021, [facility].   Attempts:  3rd Attempt  Reason for unsuccessful TCM follow-up call:  Left voice message  on # [phone]. Call placed to # [phone], voicemail not set up.   Patient has appointment with Dr Alvis Lemmings 07/20/2021. The visit information is noted on her AVS

## 2021-07-14 ENCOUNTER — Other Ambulatory Visit: Payer: Self-pay

## 2021-07-19 ENCOUNTER — Other Ambulatory Visit: Payer: Self-pay

## 2021-07-20 ENCOUNTER — Other Ambulatory Visit: Payer: Self-pay | Admitting: Pharmacist

## 2021-07-20 ENCOUNTER — Other Ambulatory Visit: Payer: Self-pay

## 2021-07-20 ENCOUNTER — Encounter: Payer: Self-pay | Admitting: Family Medicine

## 2021-07-20 ENCOUNTER — Ambulatory Visit: Payer: Medicaid Other | Attending: Family Medicine | Admitting: Family Medicine

## 2021-07-20 DIAGNOSIS — E1169 Type 2 diabetes mellitus with other specified complication: Secondary | ICD-10-CM | POA: Diagnosis not present

## 2021-07-20 DIAGNOSIS — E1065 Type 1 diabetes mellitus with hyperglycemia: Secondary | ICD-10-CM

## 2021-07-20 DIAGNOSIS — E785 Hyperlipidemia, unspecified: Secondary | ICD-10-CM

## 2021-07-20 MED ORDER — INSULIN SYRINGES (DISPOSABLE) U-100 0.5 ML MISC
1.0000 | Freq: Three times a day (TID) | 6 refills | Status: DC
  Filled 2021-07-20 – 2022-07-05 (×6): qty 120, fill #0

## 2021-07-20 MED ORDER — INSULIN LISPRO 100 UNIT/ML IJ SOLN
0.0000 [IU] | Freq: Three times a day (TID) | INTRAMUSCULAR | 3 refills | Status: DC
  Filled 2021-07-20: qty 30, 84d supply, fill #0
  Filled 2021-08-22: qty 30, 111d supply, fill #0
  Filled 2021-08-22: qty 10, 28d supply, fill #0
  Filled 2021-08-22: qty 30, 111d supply, fill #0
  Filled 2021-09-28: qty 10, 28d supply, fill #1
  Filled 2022-01-15: qty 10, 28d supply, fill #2
  Filled 2022-02-23: qty 10, 28d supply, fill #3
  Filled 2022-03-22: qty 10, 28d supply, fill #4

## 2021-07-20 MED ORDER — "INSULIN SYRINGE-NEEDLE U-100 31G X 5/16"" 0.5 ML MISC"
6 refills | Status: DC
  Filled 2021-07-20 – 2021-08-22 (×2): qty 100, 25d supply, fill #0
  Filled 2021-09-28 – 2021-11-03 (×2): qty 100, 25d supply, fill #1
  Filled 2021-12-09: qty 100, 25d supply, fill #2
  Filled 2022-01-15: qty 100, 25d supply, fill #3
  Filled 2022-01-30 – 2022-02-23 (×2): qty 100, 25d supply, fill #4
  Filled 2022-03-22 – 2022-04-11 (×2): qty 100, 25d supply, fill #5
  Filled 2022-07-05: qty 100, 25d supply, fill #6

## 2021-07-20 NOTE — Progress Notes (Signed)
Virtual Visit via Telephone Note  I connected with Madison Williams, on 07/20/2021 at 2:51 PM by telephone due to the COVID-19 pandemic and verified that I am speaking with the correct person using two identifiers.   Consent: I discussed the limitations, risks, security and privacy concerns of performing an evaluation and management service by telephone and the availability of in person appointments. I also discussed with the patient that there may be a patient responsible charge related to this service. The patient expressed understanding and agreed to proceed.   Location of Patient: Work  Government social research officer of Provider: Clinic   Persons participating in Telemedicine visit: Madison Williams Dr. Alvis Lemmings     History of Present Illness: Madison Williams is a 35 y.o. year old female with a history of hypertension, type 1 diabetes mellitus (A1c 9.6), rheumatoid arthritis, alcohol abuse, anxiety. She was hospitalized 07/03/2021 through 07/07/2021 for DKA after she had presented with altered mental status, hyperglycemia of 1498 and electrolytes abnormalities (after missing several doses of insulin due to work schedule), acute kidney injury with creatinine of 4.7, found to have elevated troponin of 71, nonspecific ST changes on EKG. She was treated with IV fluids, insulin drip. Electrolytes normalized with improvement in her renal function, glucose and she was subsequently discharged to follow-up with cardiology outpatient due to cardiac findings.  She still needs a return to work note for 07/16/21 as she has returned to work. She works at the [employer] at [employer] Her sugar has improved but she states she is still struggling to eat and has word finding deficits.  Denies presence of weakness in any body parts or gait abnormalities. Blood sugars run between 90 and 170, she has no hypoglycemia.  She does have a pattern for administration of her insulin and this is working well  with her work schedule. Past Medical History:  Diagnosis Date   Asthma    Diabetes mellitus without complication (HCC)    Rheumatoid arthritis (HCC)    No Known Allergies  Current Outpatient Medications on File Prior to Visit  Medication Sig Dispense Refill   amLODipine (NORVASC) 2.5 MG tablet Take 1 tablet (2.5 mg total) by mouth daily. 30 tablet 2   clonazePAM (KLONOPIN) 1 MG tablet Take 1 mg by mouth 2 (two) times daily as needed for anxiety.     FLUoxetine (PROZAC) 20 MG capsule Take 20 mg by mouth 2 (two) times daily.     gabapentin (NEURONTIN) 300 MG capsule Take 300-600 mg by mouth 2 (two) times daily as needed (pain).     ibuprofen (ADVIL) 200 MG tablet Take 200 mg by mouth every 6 (six) hours as needed for moderate pain.     insulin glargine (LANTUS) 100 UNIT/ML injection Inject 0.25 mLs (25 Units total) into the skin daily. 30 mL 3   insulin lispro (HUMALOG) 100 UNIT/ML injection Inject 0-9 units into the skin three times daily with meals by sliding scale (Patient taking differently: Inject 0-9 Units into the skin 3 (three) times daily with meals. Per sliding scale) 10 mL 1   Insulin Syringes, Disposable, U-100 0.5 ML MISC 1 each by Does not apply route 4 (four) times daily - after meals and at bedtime. 100 each 5   lamoTRIgine (LAMICTAL) 25 MG tablet Take 50 mg by mouth at bedtime.     metoprolol succinate (TOPROL-XL) 25 MG 24 hr tablet Take 25 mg by mouth daily.     pantoprazole (PROTONIX) 40 MG  tablet Take 1 tablet (40 mg total) by mouth at bedtime. 30 tablet 2   promethazine (PHENERGAN) 25 MG tablet Take 1 tablet (25 mg total) by mouth every 6 (six) hours as needed for nausea or vomiting. 30 tablet 0   No current facility-administered medications on file prior to visit.    ROS: See HPI  Observations/Objective: Awake, alert, oriented x3 Not in acute distress Normal mood   CMP Latest Ref Rng & Units 07/06/2021 07/05/2021 07/04/2021  Glucose 70 - 99 mg/dL 481(E) 83 563(J)   BUN 6 - 20 mg/dL 49(F) 02(O) 37(C)  Creatinine 0.44 - 1.00 mg/dL 5.88 5.02 7.74(J)  Sodium 135 - 145 mmol/L 141 147(H) 144  Potassium 3.5 - 5.1 mmol/L 3.8 3.1(L) 3.6  Chloride 98 - 111 mmol/L 111 114(H) 109  CO2 22 - 32 mmol/L 20(L) 23 20(L)  Calcium 8.9 - 10.3 mg/dL 8.0(L) 7.5(L) 7.4(L)  Total Protein 6.5 - 8.1 g/dL 6.2(L) 5.8(L) -  Total Bilirubin 0.3 - 1.2 mg/dL 0.7 0.7 -  Alkaline Phos 38 - 126 U/L 80 73 -  AST 15 - 41 U/L 50(H) 78(H) -  ALT 0 - 44 U/L 33 35 -    Lipid Panel     Component Value Date/Time   CHOL 275 (H) 02/05/2020 1152   TRIG 70 02/05/2020 1152   HDL 138 02/05/2020 1152   CHOLHDL 2.0 02/05/2020 1152   CHOLHDL 2.1 11/25/2019 0920   VLDL 16 11/25/2019 0920   LDLCALC 126 (H) 02/05/2020 1152   LABVLDL 11 02/05/2020 1152    Lab Results  Component Value Date   HGBA1C 9.6 (H) 07/03/2021    Assessment and Plan: 1. Type 1 diabetes mellitus with hyperglycemia (HCC) Uncontrolled with A1c of 9.6; goal is less than 7.0 Noncompliance due to work schedule  She endorses being more compliant with her medications and blood sugars are at goal No regimen changes today Counseled on Diabetic diet, my plate method, 287 minutes of moderate intensity exercise/week Blood sugar logs with fasting goals of 80-120 mg/dl, random of less than 867 and in the event of sugars less than 60 mg/dl or greater than 672 mg/dl encouraged to notify the clinic. Advised on the need for annual eye exams, annual foot exams, Pneumonia vaccine. - LP+Non-HDL Cholesterol; Future - Insulin Syringes, Disposable, U-100 0.5 ML MISC; 1 each by Does not apply route 4 (four) times daily -  with meals and at bedtime.  Dispense: 120 each; Refill: 6  2. Hyperlipidemia associated with type 2 diabetes mellitus (HCC) Last lipid panel from last year was elevated She is currently not on a statin I have ordered fasting labs and will place on a statin if lipids are elevated  Due to transient period of loss of  consciousness on presentation that could explain some of her word finding deficits.  She did not have any head imaging during hospitalization.  She has no other neurological deficits and has been advised that if she does recall on notice that she has some additional weakness, speech impediment to gait abnormality to notify me and I will order CT head   Follow Up Instructions: Labs in 2 weeks, follow-up for medical condition in 37-month   I discussed the assessment and treatment plan with the patient. The patient was provided an opportunity to ask questions and all were answered. The patient agreed with the plan and demonstrated an understanding of the instructions.   The patient was advised to call back or seek an in-person evaluation  if the symptoms worsen or if the condition fails to improve as anticipated.     I provided 12 minutes total of non-face-to-face time during this encounter.   Hoy Register, MD, FAAFP. [facility] [place], [place] [phone]   07/20/2021, 2:51 PM

## 2021-07-29 ENCOUNTER — Other Ambulatory Visit: Payer: Self-pay

## 2021-08-02 ENCOUNTER — Other Ambulatory Visit: Payer: Medicaid Other

## 2021-08-22 ENCOUNTER — Other Ambulatory Visit: Payer: Self-pay

## 2021-08-25 ENCOUNTER — Other Ambulatory Visit: Payer: Self-pay

## 2021-08-29 NOTE — Progress Notes (Deleted)
Cardiology Office Note    Date:  08/29/2021   ID:  Madison Williams, DOB 11/14/1985, MRN [id number]   PCP:  Hoy RegisterNewlin, Enobong, MD   [facility]  Cardiologist:  Verne Carrowhristopher McAlhany, MD *** Advanced Practice Provider:  No care team member to display Electrophysiologist:  None   [phone]   No chief complaint on file.   History of Present Illness:  Madison Williams is a 36 y.o. female  a hx of DM1, HTN, HLD, RA, asthma, gastroparesis, hx DKA admits, STEMI 11/24/2019  w/ trop > 27,000 in setting of DKA/AKI felt 2nd spasm or resolved thrombus c/b RP hematoma, cath normal coronaries, hx abuse kratom  and ETOH.   She was admitted  07/03/2021 w/ / DKA, encephalopathic, hypotensive, pH 6.89, creatinine 4.19, BUN 71, Gluc 1,016, K+ 6.7, Na 124, trop peak 1,441 . Echo no WMA and no thrombus.  Troponin elevation felt due to DKA/acidemia. Amlodipine added.     Past Medical History:  Diagnosis Date   Asthma    Diabetes mellitus without complication (HCC)    Rheumatoid arthritis (HCC)     Past Surgical History:  Procedure Laterality Date   LAPAROSCOPIC OVARIAN     LEFT HEART CATH AND CORONARY ANGIOGRAPHY N/A 11/24/2019   Procedure: LEFT HEART CATH AND CORONARY ANGIOGRAPHY;  Surgeon: Kathleene HazelMcAlhany, Christopher D, MD;  Location: [facility] INVASIVE CV LAB;  Service: Cardiovascular;  Laterality: N/A;    Current Medications: No outpatient medications have been marked as taking for the 08/31/21 encounter (Appointment) with Dyann KiefLenze, Madison Gillham M, PA-C.     Allergies:   Patient has no known allergies.   Social History   Socioeconomic History   Marital status: Single    Spouse name: Not on file   Number of children: 1   Years of education: Not on file   Highest education level: Not on file  Occupational History   Not on file  Tobacco Use   Smoking status: Former    Packs/day: 1.00    Types: Cigarettes   Smokeless tobacco: Never  Vaping Use   Vaping Use: Every day    Substances: Nicotine  Substance and Sexual Activity   Alcohol use: Not Currently   Drug use: Yes    Comment: Kratum   Sexual activity: Not on file  Other Topics Concern   Not on file  Social History Narrative   Not on file   Social Determinants of Health   Financial Resource Strain: Not on file  Food Insecurity: Not on file  Transportation Needs: Not on file  Physical Activity: Not on file  Stress: Not on file  Social Connections: Not on file     Family History:  The patient's ***Family history is unknown by patient.   ROS:   Please see the history of present illness.    ROS All other systems reviewed and are negative.   PHYSICAL EXAM:   VS:  LMP 03/16/2018 Comment: negative urine preg test 01/14/20  Physical Exam  GEN: Well nourished, well developed, in no acute distress  HEENT: normal  Neck: no JVD, carotid bruits, or masses Cardiac:RRR; no murmurs, rubs, or gallops  Respiratory:  clear to auscultation bilaterally, normal work of breathing GI: soft, nontender, nondistended, + BS Ext: without cyanosis, clubbing, or edema, Good distal pulses bilaterally MS: no deformity or atrophy  Skin: warm and dry, no rash Neuro:  Alert and Oriented x 3, Strength and sensation are intact Psych: euthymic mood, full affect  Wt  Readings from Last 3 Encounters:  07/03/21 122 lb 9.2 oz (55.6 kg)  04/29/21 125 lb (56.7 kg)  03/14/21 125 lb 9.6 oz (57 kg)      Studies/Labs Reviewed:   EKG:  EKG is*** ordered today.  The ekg ordered today demonstrates ***  Recent Labs: 07/04/2021: Magnesium 2.0 07/06/2021: ALT 33; BUN 21; Creatinine, Ser 0.96; Hemoglobin 11.0; Platelets 105; Potassium 3.8; Sodium 141   Lipid Panel    Component Value Date/Time   CHOL 275 (H) 02/05/2020 1152   TRIG 70 02/05/2020 1152   HDL 138 02/05/2020 1152   CHOLHDL 2.0 02/05/2020 1152   CHOLHDL 2.1 11/25/2019 0920   VLDL 16 11/25/2019 0920   LDLCALC 126 (H) 02/05/2020 1152    Additional studies/ records  that were reviewed today include:  ECHO: 07/04/2021   1. Left ventricular ejection fraction, by estimation, is 60 to 65%. Left  ventricular ejection fraction by 3D volume is 64 %. The left ventricle has  normal function. The left ventricle has no regional wall motion  abnormalities. Left ventricular diastolic   parameters were normal.   2. Right ventricular systolic function is normal. The right ventricular  size is normal.   3. The mitral valve is normal in structure. Trivial mitral valve  regurgitation. No evidence of mitral stenosis.   4. The aortic valve was not well visualized. Aortic valve regurgitation  is mild. No aortic stenosis is present.    ECHO: 11/25/2019  1. Definity used; inferolateral and apical hypokinesis with overall mild LV dysfunction; grade 1 diastolic dysfunction; moderate AI.   2. Left ventricular ejection fraction, by estimation, is 45 to 50%. The  left ventricle has mildly decreased function. The left ventricle  demonstrates regional wall motion abnormalities (see scoring  diagram/findings for description). Left ventricular  diastolic parameters are consistent with Grade I diastolic dysfunction  (impaired relaxation).   3. Right ventricular systolic function is normal. The right ventricular  size is normal.   4. The mitral valve is normal in structure. Trivial mitral valve  regurgitation. No evidence of mitral stenosis.   5. The aortic valve is tricuspid. Aortic valve regurgitation is moderate.  No aortic stenosis is present.   6. The inferior vena cava is dilated in size with >50% respiratory  variability, suggesting right atrial pressure of 8 mmHg.    CARDIA CATH: 11/24/2019 There is mild to moderate left ventricular systolic dysfunction. LV end diastolic pressure is normal. The left ventricular ejection fraction is 35-45% by visual estimate. There is no mitral valve regurgitation.   1. No angiographic evidence of CAD 2. Moderate segmental LV  systolic dysfunction   It is not entirely clear what is causing her elevated troponin and chest pain. No evidence of coronary dissection. Excellent flow down all vessels with no atherosclerotic disease. This could represent a stress induced cardiomyopathy but generally we would not see a troponin level over 27,000 in the setting of stress induced cardiomyopathy. Possible coronary spasm or resolved thrombus. Also cannot exclude PE.  Admit to the ICU under PCCM service for continued management of DKA.     Risk Assessment/Calculations:   {Does this patient have ATRIAL FIBRILLATION?:[phone]}     ASSESSMENT:    1. Chest tightness   2. Type 1 diabetes mellitus with hyperglycemia (HCC)   3. Essential hypertension   4. Mixed hyperlipidemia      PLAN:  In order of problems listed above:  STEMI 11/24/19 in setting of DKA/AKI felt 2nd spasm or resolved thrombus,  elevated troponins 06/2021 setting DKA, echo normal LVEF, no WMA. Amlodpipine added  DM1 uncontrolled  HTN  HLD  Shared Decision Making/Informed Consent   {Are you ordering a CV Procedure (e.g. stress test, cath, DCCV, TEE, etc)?   Press F2        :[id number]}    Medication Adjustments/Labs and Tests Ordered: Current medicines are reviewed at length with the patient today.  Concerns regarding medicines are outlined above.  Medication changes, Labs and Tests ordered today are listed in the Patient Instructions below. There are no Patient Instructions on file for this visit.   Elson Clan, PA-C  08/29/2021 12:32 PM    [facility] [address] [place], [place], [place]  [address] Phone: [phone]; Fax: [phone]

## 2021-08-31 ENCOUNTER — Ambulatory Visit: Payer: Medicaid Other | Admitting: Physician Assistant

## 2021-08-31 DIAGNOSIS — I10 Essential (primary) hypertension: Secondary | ICD-10-CM

## 2021-08-31 DIAGNOSIS — R0789 Other chest pain: Secondary | ICD-10-CM

## 2021-08-31 DIAGNOSIS — E782 Mixed hyperlipidemia: Secondary | ICD-10-CM

## 2021-08-31 DIAGNOSIS — E1065 Type 1 diabetes mellitus with hyperglycemia: Secondary | ICD-10-CM

## 2021-09-29 ENCOUNTER — Other Ambulatory Visit: Payer: Self-pay

## 2021-09-29 ENCOUNTER — Telehealth: Payer: Self-pay | Admitting: Family Medicine

## 2021-09-29 ENCOUNTER — Ambulatory Visit: Payer: Self-pay

## 2021-09-29 NOTE — Telephone Encounter (Signed)
Pt called here, after talking with Kayla at [facility] and was advised to see Cari.  There is a telephone encounter from [facility] in the pts chart. I advised her that Johnette Abraham was not here this afternoon or tomorrow, and that both of our providers this afternoon were full. I offered her an appt with Tonya tomorrow morning but she says she has to work and can't come. Please review Kayla's message and advise if I can overbook this afternoon with one of the providers. ?

## 2021-09-29 NOTE — Telephone Encounter (Signed)
?  Chief Complaint: numbness/tingling ?Symptoms: RLE numbness and tingling and sharp pain more in R shoulder blade, headache and dizziness ?Frequency: 3 days ?Pertinent Negatives: NA ?Disposition: [] ED /[] Urgent Care (no appt availability in office) / [] Appointment(In office/virtual)/ []  [facility]/ [] Home Care/ [] Refused Recommended Disposition /[x] [facility]/ []  Follow-up with PCP ?Additional Notes: Pt states she is concerned about these symptoms d/t her DM but was unsure if this was the cause. Advised her she can go to UC or PCE and be seen as walk in. Pt was provided with [facility] number per request.  ? ? ?Reason for Disposition ? [1] Tingling (e.g., pins and needles) of the face, arm / hand, or leg / foot on one side of the body AND [2] present now (Exceptions: chronic/recurrent symptom lasting > 4 weeks or tingling from known cause, such as: bumped elbow, carpal tunnel syndrome, pinched nerve, frostbite) ? ?Answer Assessment - Initial Assessment Questions ?1. SYMPTOM: "What is the main symptom you are concerned about?" (e.g., weakness, numbness) ?    Numbness and tingling in RLE, in between shoulder blades more to R sharp pain and tingling and numbness  ?2. ONSET: "When did this start?" (minutes, hours, days; while sleeping) ?    3 days ago  ?4. PATTERN "Does this come and go, or has it been constant since it started?"  "Is it present now?" ?    constant ?6. NEUROLOGIC SYMPTOMS: "Have you had any of the following symptoms: headache, dizziness, vision loss, double vision, changes in speech, unsteady on your feet?" ?    Headaches 3-4 days, dizziness ?7. OTHER SYMPTOMS: "Do you have any other symptoms?" ?    No ? ?Protocols used: Neurologic Deficit-A-AH ? ?

## 2021-09-29 NOTE — Telephone Encounter (Signed)
Att to contact pt to schedule next available appt no ans lvm.If pt continues to have numbness, she should seek medical attention at the emergency department.   ?

## 2021-09-29 NOTE — Telephone Encounter (Signed)
Attempt to call patient to schedule appt with Dr. Alvis LemmingsNewlin. LVM to return call.  ?

## 2021-09-30 ENCOUNTER — Other Ambulatory Visit: Payer: Self-pay

## 2021-09-30 NOTE — Telephone Encounter (Addendum)
Left message on voicemail to return call.

## 2021-10-04 ENCOUNTER — Ambulatory Visit: Payer: Self-pay | Admitting: *Deleted

## 2021-10-04 NOTE — Telephone Encounter (Signed)
Patient called and reported she is still having N/T in right leg/ foot. And sharp pain in right shoulder per agent. Patient hung up before being triaged. NT attempted to call patient back , no answer, LVMTCB [phone]. ?

## 2021-10-11 NOTE — Telephone Encounter (Addendum)
Left message on voicemail to return call.

## 2021-10-13 ENCOUNTER — Ambulatory Visit: Payer: Medicaid Other | Attending: Family | Admitting: Family

## 2021-10-13 ENCOUNTER — Encounter: Payer: Self-pay | Admitting: Family

## 2021-10-13 ENCOUNTER — Other Ambulatory Visit: Payer: Self-pay

## 2021-10-13 VITALS — BP 115/77 | HR 94 | Wt 133.0 lb

## 2021-10-13 DIAGNOSIS — M5441 Lumbago with sciatica, right side: Secondary | ICD-10-CM | POA: Diagnosis present

## 2021-10-13 DIAGNOSIS — M5431 Sciatica, right side: Secondary | ICD-10-CM

## 2021-10-13 DIAGNOSIS — F32A Depression, unspecified: Secondary | ICD-10-CM | POA: Diagnosis not present

## 2021-10-13 DIAGNOSIS — M79671 Pain in right foot: Secondary | ICD-10-CM | POA: Diagnosis not present

## 2021-10-13 DIAGNOSIS — F419 Anxiety disorder, unspecified: Secondary | ICD-10-CM | POA: Insufficient documentation

## 2021-10-13 DIAGNOSIS — Z7182 Exercise counseling: Secondary | ICD-10-CM | POA: Diagnosis not present

## 2021-10-13 DIAGNOSIS — Z79899 Other long term (current) drug therapy: Secondary | ICD-10-CM | POA: Diagnosis not present

## 2021-10-13 MED ORDER — NAPROXEN 250 MG PO TABS
250.0000 mg | ORAL_TABLET | Freq: Two times a day (BID) | ORAL | 0 refills | Status: DC
  Filled 2021-10-13: qty 10, 5d supply, fill #0

## 2021-10-13 NOTE — Telephone Encounter (Signed)
Scheduled appt for today with Provider Ethelene Hal, NP at [facility]. ?

## 2021-10-13 NOTE — Progress Notes (Signed)
? ?Madison Williams, is a 36 y.o. female ? ?ZOX:[id number] ? ?[id number] ? ?DOB - 01/27/1986 ? ?Subjective:  ?Chief Complaint and HPI: ?Madison Williams is a 36 y.o. female who presents to the clinic this afternoon with complaints of pain radiating from the lower part of her back all the way to the right foot.  Patient says symptoms started a few weeks ago and they are getting worse mainly when she is sitting down.  [place] [employer] [place] [employer] and 75% of the time sitting behind the desk.  She has a past medical history of anxiety and depression and her psychiatrist put her on some gabapentin which is helping with her pain right now.  Denies difficulty ambulating, chest pain, fever, or any other symptoms than above.  Chaperone Harle Stanfordlycia Farrington was in the room during physical exam. ? ?ED/Hospital notes reviewed.   ?Social History: Reviewed ?Family history: Reviewed ? ?ROS:   ?Constitutional:  No f/c, No night sweats, No unexplained weight loss. ?EENT:  No vision changes, No blurry vision, No hearing changes. No mouth, throat, or ear problems.  ?Respiratory: No cough, No SOB ?Cardiac: No CP, no palpitations ?GI:  No abd pain, No N/V/D. ?GU: No Urinary s/sx ?Musculoskeletal: Pain to lower back radiating to the right lower extremity, right foot. ?Neuro: No headache, no dizziness, no motor weakness.  ?Skin: No rash ?Endocrine:  No polydipsia. No polyuria.  ?Psych: Denies SI/HI ? ?No problems updated. ? ?ALLERGIES: ?No Known Allergies ? ?PAST MEDICAL HISTORY: ?Past Medical History:  ?Diagnosis Date  ? Asthma   ? Diabetes mellitus without complication (HCC)   ? Rheumatoid arthritis (HCC)   ? ? ?MEDICATIONS AT HOME: ?Prior to Admission medications   ?Medication Sig Start Date End Date Taking? Authorizing Provider  ?naproxen (NAPROSYN) 250 MG tablet Take 1 tablet (250 mg total) by mouth 2 (two) times daily with a meal for 5 days. 10/13/21 10/18/21 Yes Eleonore ChiquitoGiramata, Darron Stuck, FNP  ?amLODipine (NORVASC) 2.5 MG tablet Take 1 tablet (2.5  mg total) by mouth daily. 07/07/21 10/05/21  [place], Eric J, DO  ?clonazePAM (KLONOPIN) 1 MG tablet Take 1 mg by mouth 2 (two) times daily as needed for anxiety. 06/14/21   [provider]  ?FLUoxetine (PROZAC) 20 MG capsule Take 20 mg by mouth 2 (two) times daily. 12/29/20   [provider]  ?gabapentin (NEURONTIN) 300 MG capsule Take 300-600 mg by mouth 2 (two) times daily as needed (pain). 12/29/20   [provider]  ?ibuprofen (ADVIL) 200 MG tablet Take 200 mg by mouth every 6 (six) hours as needed for moderate pain.    [provider]  ?insulin glargine (LANTUS) 100 UNIT/ML injection Inject 0.25 mLs (25 Units total) into the skin daily. 03/14/21 10/28/21  Hoy RegisterNewlin, Enobong, MD  ?insulin lispro (HUMALOG) 100 UNIT/ML injection Inject 0-0.09 mLs (0-9 Units total) into the skin 3 (three) times daily with meals. Per sliding scale 07/20/21   Hoy RegisterNewlin, Enobong, MD  ?Insulin Syringe-Needle U-100 (TRUEPLUS INSULIN SYRINGE) 31G X 5/16" 0.5 ML MISC Use to inject insulin daily. 07/20/21   Hoy RegisterNewlin, Enobong, MD  ?Insulin Syringes, Disposable, U-100 0.5 ML MISC 1 each by Does not apply route 4 (four) times daily -  with meals and at bedtime. 07/20/21   Hoy RegisterNewlin, Enobong, MD  ?lamoTRIgine (LAMICTAL) 25 MG tablet Take 50 mg by mouth at bedtime. 06/13/21   [provider]  ?metoprolol succinate (TOPROL-XL) 25 MG 24 hr tablet Take 25 mg by mouth daily. 06/14/21   [provider]  ?pantoprazole (PROTONIX) 40 MG tablet Take 1 tablet (40 mg total) by mouth at bedtime. 07/07/21 10/05/21  [place], Eric J, DO  ?promethazine (PHENERGAN) 25 MG tablet Take 1 tablet (25 mg total) by mouth every 6 (six) hours as needed for nausea or vomiting. 01/18/21   Tyrone Nine, MD  ? ? ? ?Objective:  ?EXAM:  ? ?Vitals:  ? 10/13/21 1605  ?BP: 115/77  ?Pulse: 94  ?SpO2: 100%  ?Weight: 133 lb (60.3 kg)  ? ? ?General appearance : A&OX3. NAD. Non-toxic-appearing ?HEENT: Atraumatic and Normocephalic.  PERRLA. EOM intact.   TM clear B. ?Mouth-MMM, post pharynx WNL w/o erythema, No PND. ?Neck: supple, no JVD. No cervical lymphadenopathy. No thyromegaly ?Chest/Lungs:  Breathing-non-labored, Good air entry bilaterally, breath sounds normal without rales, rhonchi, or wheezing  ?CVS: S1 S2 regular, no murmurs, gallops, rubs  ?Abdomen: Bowel sounds present, Non tender and not distended with no gaurding, rigidity or rebound. ?Extremities: Bilateral Lower Ext shows no edema, both legs are warm to touch with = pulse throughout.  Pain with palpation to the right part of buttock muscles with palpation. ?Neurology:  CN II-XII grossly intact, Non focal.   ?Psych:  TP linear. J/I WNL. Normal speech. Appropriate eye contact and affect.  ?Skin: Intact, no redness or rash. ? ?Data Review ?Lab Results  ?Component Value Date  ? HGBA1C 9.6 (H) 07/03/2021  ? HGBA1C 12.2 (H) 01/16/2021  ? HGBA1C 12.0 (H) 01/15/2021  ? ? ? ?Assessment & Plan  ? ?1. Sciatica of right side ? ?- naproxen (NAPROSYN) 250 MG tablet; Take 1 tablet (250 mg total) by mouth 2 (two) times daily with a meal for 5 days.  Dispense: 10 tablet; Refill: 0 ? ? ?Patient have been counseled extensively about nutrition and exercise ? ?Return if symptoms worsen or fail to improve. ? ?The patient was given clear instructions to go to ER or return to medical center if symptoms don't improve, worsen or new problems develop. The patient verbalized understanding. The patient was told to call to get lab results if they haven't heard anything in the next week.  ? ? ? ?Eleonore Chiquito, PA-C ?[facility] ?[place], [place] ?[phone]   ?10/13/2021, 4:51 PM  ?

## 2021-10-13 NOTE — Patient Instructions (Signed)
1) Take naproxen 250 mg by mouth twice per day with food for 5 days ?2) Use cold or hot packs as needed ?3) Report new or worsening symptoms to the clinic or local ED ?

## 2021-10-14 ENCOUNTER — Other Ambulatory Visit: Payer: Self-pay

## 2021-10-17 ENCOUNTER — Other Ambulatory Visit: Payer: Self-pay

## 2021-10-18 ENCOUNTER — Ambulatory Visit: Payer: Self-pay

## 2021-10-18 ENCOUNTER — Telehealth: Payer: Medicaid Other | Admitting: Physician Assistant

## 2021-10-18 ENCOUNTER — Other Ambulatory Visit: Payer: Self-pay

## 2021-10-18 DIAGNOSIS — M5431 Sciatica, right side: Secondary | ICD-10-CM | POA: Diagnosis not present

## 2021-10-18 MED ORDER — GABAPENTIN 300 MG PO CAPS
300.0000 mg | ORAL_CAPSULE | Freq: Two times a day (BID) | ORAL | 0 refills | Status: DC | PRN
  Filled 2021-10-18: qty 30, 8d supply, fill #0

## 2021-10-18 MED ORDER — CYCLOBENZAPRINE HCL 5 MG PO TABS
5.0000 mg | ORAL_TABLET | Freq: Three times a day (TID) | ORAL | 1 refills | Status: DC | PRN
  Filled 2021-10-18: qty 30, 5d supply, fill #0

## 2021-10-18 NOTE — Telephone Encounter (Signed)
She has already received refill of medication after an E- visit. ?

## 2021-10-18 NOTE — Patient Instructions (Signed)
?Madison Williams, thank you for joining Mar Daring, PA-C for today's virtual visit.  While this provider is not your primary care provider (PCP), if your PCP is located in our provider database this encounter information will be shared with them immediately following your visit. ? ?Consent: ?(Patient) Madison Williams provided verbal consent for this virtual visit at the beginning of the encounter. ? ?Current Medications: ? ?Current Outpatient Medications:  ?  cyclobenzaprine (FLEXERIL) 5 MG tablet, Take 1-2 tablets (5-10 mg total) by mouth 3 (three) times daily as needed for muscle spasms., Disp: 30 tablet, Rfl: 1 ?  amLODipine (NORVASC) 2.5 MG tablet, Take 1 tablet (2.5 mg total) by mouth daily., Disp: 30 tablet, Rfl: 2 ?  clonazePAM (KLONOPIN) 1 MG tablet, Take 1 mg by mouth 2 (two) times daily as needed for anxiety., Disp: , Rfl:  ?  FLUoxetine (PROZAC) 20 MG capsule, Take 20 mg by mouth 2 (two) times daily., Disp: , Rfl:  ?  gabapentin (NEURONTIN) 300 MG capsule, Take 1-2 capsules (300-600 mg total) by mouth 2 (two) times daily as needed (pain)., Disp: 30 capsule, Rfl: 0 ?  ibuprofen (ADVIL) 200 MG tablet, Take 200 mg by mouth every 6 (six) hours as needed for moderate pain., Disp: , Rfl:  ?  insulin glargine (LANTUS) 100 UNIT/ML injection, Inject 0.25 mLs (25 Units total) into the skin daily., Disp: 30 mL, Rfl: 3 ?  insulin lispro (HUMALOG) 100 UNIT/ML injection, Inject 0-0.09 mLs (0-9 Units total) into the skin 3 (three) times daily with meals. Per sliding scale, Disp: 30 mL, Rfl: 3 ?  Insulin Syringe-Needle U-100 (TRUEPLUS INSULIN SYRINGE) 31G X 5/16" 0.5 ML MISC, Use to inject insulin daily., Disp: 100 each, Rfl: 6 ?  Insulin Syringes, Disposable, U-100 0.5 ML MISC, 1 each by Does not apply route 4 (four) times daily -  with meals and at bedtime., Disp: 120 each, Rfl: 6 ?  lamoTRIgine (LAMICTAL) 25 MG tablet, Take 50 mg by mouth at bedtime., Disp: , Rfl:  ?  metoprolol succinate  (TOPROL-XL) 25 MG 24 hr tablet, Take 25 mg by mouth daily., Disp: , Rfl:  ?  pantoprazole (PROTONIX) 40 MG tablet, Take 1 tablet (40 mg total) by mouth at bedtime., Disp: 30 tablet, Rfl: 2 ?  promethazine (PHENERGAN) 25 MG tablet, Take 1 tablet (25 mg total) by mouth every 6 (six) hours as needed for nausea or vomiting., Disp: 30 tablet, Rfl: 0  ? ?Medications ordered in this encounter:  ?Meds ordered this encounter  ?Medications  ? cyclobenzaprine (FLEXERIL) 5 MG tablet  ?  Sig: Take 1-2 tablets (5-10 mg total) by mouth 3 (three) times daily as needed for muscle spasms.  ?  Dispense:  30 tablet  ?  Refill:  1  ?  Order Specific Question:   Supervising Provider  ?  Answer:   Noemi Chapel [[id number]]  ? gabapentin (NEURONTIN) 300 MG capsule  ?  Sig: Take 1-2 capsules (300-600 mg total) by mouth 2 (two) times daily as needed (pain).  ?  Dispense:  30 capsule  ?  Refill:  0  ?  Order Specific Question:   Supervising Provider  ?  Answer:   Noemi Chapel [[id number]]  ?  ? ?*If you need refills on other medications prior to your next appointment, please contact your pharmacy* ? ?Follow-Up: ?Call back or seek an in-person evaluation if the symptoms worsen or if the condition fails to improve as anticipated. ? ?Other Instructions ?Sciatica Rehab ?  Ask your health care provider which exercises are safe for you. Do exercises exactly as told by your health care provider and adjust them as directed. It is normal to feel mild stretching, pulling, tightness, or discomfort as you do these exercises. Stop right away if you feel sudden pain or your pain gets worse. Do not begin these exercises until told by your health care provider. ?Stretching and range-of-motion exercises ?These exercises warm up your muscles and joints and improve the movement and flexibility of your hips and back. These exercises also help to relieve pain, numbness, and tingling. ?Sciatic nerve glide ?Sit in a chair with your head facing down toward your chest. Place  your hands behind your back. Let your shoulders slump forward. ?Slowly straighten one of your legs while you tilt your head back as if you are looking toward the ceiling. Only straighten your leg as far as you can without making your symptoms worse. ?Hold this position for __________ seconds. ?Slowly return to the starting position. ?Repeat with your other leg. ?Repeat __________ times. Complete this exercise __________ times a day. ?Knee to chest with hip adduction and internal rotation ? ?Lie on your back on a firm surface with both legs straight. ?Bend one of your knees and move it up toward your chest until you feel a gentle stretch in your lower back and buttock. Then, move your knee toward the shoulder that is on the opposite side from your leg. This is hip adduction and internal rotation. ?Hold your leg in this position by holding on to the front of your knee. ?Hold this position for __________ seconds. ?Slowly return to the starting position. ?Repeat with your other leg. ?Repeat __________ times. Complete this exercise __________ times a day. ?Prone extension on elbows ? ?Lie on your abdomen on a firm surface. A bed may be too soft for this exercise. ?Prop yourself up on your elbows. ?Use your arms to help lift your chest up until you feel a gentle stretch in your abdomen and your lower back. ?This will place some of your body weight on your elbows. If this is uncomfortable, try stacking pillows under your chest. ?Your hips should stay down, against the surface that you are lying on. Keep your hip and back muscles relaxed. ?Hold this position for __________ seconds. ?Slowly relax your upper body and return to the starting position. ?Repeat __________ times. Complete this exercise __________ times a day. ?Strengthening exercises ?These exercises build strength and endurance in your back. Endurance is the ability to use your muscles for a long time, even after they get tired. ?Pelvic tilt ?This exercise  strengthens the muscles that lie deep in the abdomen. ?Lie on your back on a firm surface. Bend your knees and keep your feet flat on the floor. ?Tense your abdominal muscles. Tip your pelvis up toward the ceiling and flatten your lower back into the floor. ?To help with this exercise, you may place a small towel under your lower back and try to push your back into the towel. ?Hold this position for __________ seconds. ?Let your muscles relax completely before you repeat this exercise. ?Repeat __________ times. Complete this exercise __________ times a day. ?Alternating arm and leg raises ? ?Get on your hands and knees on a firm surface. If you are on a hard floor, you may want to use padding, such as an exercise mat, to cushion your knees. ?Line up your arms and legs. Your hands should be directly below your shoulders, and  your knees should be directly below your hips. ?Lift your left leg behind you. At the same time, raise your right arm and straighten it in front of you. ?Do not lift your leg higher than your hip. ?Do not lift your arm higher than your shoulder. ?Keep your abdominal and back muscles tight. ?Keep your hips facing the ground. ?Do not arch your back. ?Keep your balance carefully, and do not hold your breath. ?Hold this position for __________ seconds. ?Slowly return to the starting position. ?Repeat with your right leg and your left arm. ?Repeat __________ times. Complete this exercise __________ times a day. ?Posture and body mechanics ?Good posture and healthy body mechanics can help to relieve stress in your body's tissues and joints. Body mechanics refers to the movements and positions of your body while you do your daily activities. Posture is part of body mechanics. Good posture means: ?Your spine is in its natural S-curve position (neutral). ?Your shoulders are pulled back slightly. ?Your head is not tipped forward. ?Follow these guidelines to improve your posture and body mechanics in your  everyday activities. ?Standing ? ?When standing, keep your spine neutral and your feet about hip width apart. Keep a slight bend in your knees. Your ears, shoulders, and hips should line up. ?When you do a task in [facility]

## 2021-10-18 NOTE — Telephone Encounter (Signed)
Summary: sciatica pain  ? PT called with sciatica pain needing to be seen asap.  I checked with [facility] and there is nothing until April 12th.  Please [phone]   ?  ? ?Reason for Disposition ? [1] Pain radiates into the thigh or further down the leg AND [2] one leg ? ?Answer Assessment - Initial Assessment Questions ?1. ONSET: "When did the pain begin?"  ?    3 weeks ?2. LOCATION: "Where does it hurt?" (upper, mid or lower back) ?    Lower back ?3. SEVERITY: "How bad is the pain?"  (e.g., Scale 1-10; mild, moderate, or severe) ?  - MILD (1-3): doesn't interfere with normal activities  ?  - MODERATE (4-7): interferes with normal activities or awakens from sleep  ?  - SEVERE (8-10): excruciating pain, unable to do any normal activities  ?    8/10 ?4. PATTERN: "Is the pain constant?" (e.g., yes, no; constant, intermittent)  ?    yes ?5. RADIATION: "Does the pain shoot into your legs or elsewhere?" ?    Down leg ?6. CAUSE:  "What do you think is causing the back pain?"  ?    sciatica ?7. BACK OVERUSE:  "Any recent lifting of heavy objects, strenuous work or exercise?" ?    no ?8. MEDICATIONS: "What have you taken so far for the pain?" (e.g., nothing, acetaminophen, NSAIDS) ?    Aleve and gabapentin ?9. NEUROLOGIC SYMPTOMS: "Do you have any weakness, numbness, or problems with bowel/bladder control?" ?    no ?10. OTHER SYMPTOMS: "Do you have any other symptoms?" (e.g., fever, abdominal pain, burning with urination, blood in urine) ?      no ?11. PREGNANCY: "Is there any chance you are pregnant?" (e.g., yes, no; LMP) ?      no ? ?Protocols used: Back Pain-A-AH ? ?

## 2021-10-18 NOTE — Progress Notes (Signed)
?Virtual Visit Consent  ? ?Madison Williams, you are scheduled for a virtual visit with a [facility] provider today.   ?  ?Just as with appointments in the office, your consent must be obtained to participate.  Your consent will be active for this visit and any virtual visit you may have with one of our providers in the next 365 days.   ?  ?If you have a MyChart account, a copy of this consent can be sent to you electronically.  All virtual visits are billed to your insurance company just like a traditional visit in the office.   ? ?As this is a virtual visit, video technology does not allow for your provider to perform a traditional examination.  This may limit your provider's ability to fully assess your condition.  If your provider identifies any concerns that need to be evaluated in person or the need to arrange testing (such as labs, EKG, etc.), we will make arrangements to do so.   ?  ?Although advances in technology are sophisticated, we cannot ensure that it will always work on either your end or our end.  If the connection with a video visit is poor, the visit may have to be switched to a telephone visit.  With either a video or telephone visit, we are not always able to ensure that we have a secure connection.    ? ?I need to obtain your verbal consent now.   Are you willing to proceed with your visit today?  ?  ?Madison Williams has provided verbal consent on 10/18/2021 for a virtual visit (video or telephone). ?  ?Mar Daring, PA-C  ? ?Date: 10/18/2021 12:12 PM ? ? ?Virtual Visit via Video Note  ? ?Madison Williams, connected with  Madison Williams  ([id number], 26-Feb-1986) on 10/18/21 at 12:00 PM EDT by a video-enabled telemedicine application and verified that I am speaking with the correct person using two identifiers. ? ?Location: ?Patient: Virtual Visit Location Patient: Other: work; isolated ?Provider: Virtual Visit Location Provider: Home Office ?  ?I discussed the  limitations of evaluation and management by telemedicine and the availability of in person appointments. The patient expressed understanding and agreed to proceed.   ? ?History of Present Illness: ?Madison Williams is a 36 y.o. who identifies as a female who was assigned female at birth, and is being seen today for sciatica on the right side, recurrent issue. Was seen on 10/14/21 by PCP/Internal medicine and was given naproxen 250mg . She did not pick up because she has this medication at home already.  ? ?HPI: Back Pain ?This is a recurrent problem. The current episode started 1 to 4 weeks ago (3-4 weeks). The problem occurs constantly. The problem has been gradually worsening since onset. The pain is present in the gluteal and lumbar spine. The quality of the pain is described as aching, shooting and stabbing. The pain radiates to the right thigh, right knee and right foot. The pain is moderate. The pain is The same all the time. The symptoms are aggravated by bending, sitting and standing. Stiffness is present All day. Associated symptoms include leg pain and tingling. Pertinent negatives include no bladder incontinence, bowel incontinence, dysuria, fever, numbness, paresis, paresthesias, perianal numbness or weakness. Risk factors include sedentary lifestyle (sits at work long hours). She has tried bed rest and NSAIDs (gabapentin from psychiatrist) for the symptoms. The treatment provided no relief.   ? ? ?Problems:  ?Patient Active Problem List  ?  Diagnosis Date Noted  ? Hyperkalemia 07/03/2021  ? SIRS (systemic inflammatory response syndrome) ([place]) 07/03/2021  ? Hypermagnesemia 07/03/2021  ? Hyperphosphatemia 07/03/2021  ? Septic shock ([place])   ? Rheumatoid arthritis ([place])   ? Substance withdrawal ([place]) 01/14/2020  ? SOB (shortness of breath)   ? Hypokalemia   ? Macrocytosis without anemia   ? Retroperitoneal hematoma   ? DKA (diabetic ketoacidosis) ([place]) 11/24/2019  ? Acute ST elevation myocardial  infarction (STEMI) of anterolateral wall (H[facility])   ? Abnormal EKG   ? Elevated troponin   ? Nausea & vomiting 10/16/2019  ? Abdominal pain 10/16/2019  ? Chest tightness   ? Mild intermittent asthma without complication   ? Acute vomiting 01/31/2019  ? Type 1 diabetes mellitus with hyperglycemia ([place]) 01/31/2019  ? Uninsured 01/31/2019  ? DKA (diabetic ketoacidoses) 03/30/2018  ? Acute renal failure ([place]) 03/30/2018  ? Leukocytosis 03/30/2018  ? Dehydration 03/30/2018  ? Tobacco use disorder 03/30/2018  ?  ?Allergies: No Known Allergies ?Medications:  ?Current Outpatient Medications:  ?  cyclobenzaprine (FLEXERIL) 5 MG tablet, Take 1-2 tablets (5-10 mg total) by mouth 3 (three) times daily as needed for muscle spasms., Disp: 30 tablet, Rfl: 1 ?  amLODipine (NORVASC) 2.5 MG tablet, Take 1 tablet (2.5 mg total) by mouth daily., Disp: 30 tablet, Rfl: 2 ?  clonazePAM (KLONOPIN) 1 MG tablet, Take 1 mg by mouth 2 (two) times daily as needed for anxiety., Disp: , Rfl:  ?  FLUoxetine (PROZAC) 20 MG capsule, Take 20 mg by mouth 2 (two) times daily., Disp: , Rfl:  ?  gabapentin (NEURONTIN) 300 MG capsule, Take 1-2 capsules (300-600 mg total) by mouth 2 (two) times daily as needed (pain)., Disp: 30 capsule, Rfl: 0 ?  ibuprofen (ADVIL) 200 MG tablet, Take 200 mg by mouth every 6 (six) hours as needed for moderate pain., Disp: , Rfl:  ?  insulin glargine (LANTUS) 100 UNIT/ML injection, Inject 0.25 mLs (25 Units total) into the skin daily., Disp: 30 mL, Rfl: 3 ?  insulin lispro (HUMALOG) 100 UNIT/ML injection, Inject 0-0.09 mLs (0-9 Units total) into the skin 3 (three) times daily with meals. Per sliding scale, Disp: 30 mL, Rfl: 3 ?  Insulin Syringe-Needle U-100 (TRUEPLUS INSULIN SYRINGE) 31G X 5/16" 0.5 ML MISC, Use to inject insulin daily., Disp: 100 each, Rfl: 6 ?  Insulin Syringes, Disposable, U-100 0.5 ML MISC, 1 each by Does not apply route 4 (four) times daily -  with meals and at bedtime., Disp: 120 each, Rfl: 6 ?   lamoTRIgine (LAMICTAL) 25 MG tablet, Take 50 mg by mouth at bedtime., Disp: , Rfl:  ?  metoprolol succinate (TOPROL-XL) 25 MG 24 hr tablet, Take 25 mg by mouth daily., Disp: , Rfl:  ?  pantoprazole (PROTONIX) 40 MG tablet, Take 1 tablet (40 mg total) by mouth at bedtime., Disp: 30 tablet, Rfl: 2 ?  promethazine (PHENERGAN) 25 MG tablet, Take 1 tablet (25 mg total) by mouth every 6 (six) hours as needed for nausea or vomiting., Disp: 30 tablet, Rfl: 0 ? ?Observations/Objective: ?Patient is well-developed, well-nourished in no acute distress.  ?Resting comfortably  ?Head is normocephalic, atraumatic.  ?No labored breathing.  ?Speech is clear and coherent with logical content.  ?Patient is alert and oriented at baseline.  ? ? ?Assessment and Plan: ?1. Sciatica, right side ?- cyclobenzaprine (FLEXERIL) 5 MG tablet; Take 1-2 tablets (5-10 mg total) by mouth 3 (three) times daily as needed for muscle spasms.  Dispense: 30  tablet; Refill: 1 ?- gabapentin (NEURONTIN) 300 MG capsule; Take 1-2 capsules (300-600 mg total) by mouth 2 (two) times daily as needed (pain).  Dispense: 30 capsule; Refill: 0 ? ?- Sciatica right side ?- Continue naproxen at home, may add tylenol ?- Gabapentin given for neuropathy symptoms  ?- Flexeril for muscle spasm ?- Heat ?- Massage ?- Exercises and stretches provided via AVS ?- Seek in person evaluation if worsening or not improving ? ?Follow Up Instructions: ?I discussed the assessment and treatment plan with the patient. The patient was provided an opportunity to ask questions and all were answered. The patient agreed with the plan and demonstrated an understanding of the instructions.  A copy of instructions were sent to the patient via MyChart unless otherwise noted below.  ? ? ?The patient was advised to call back or seek an in-person evaluation if the symptoms worsen or if the condition fails to improve as anticipated. ? ?Time:  ?I spent 15 minutes with the patient via telehealth technology  discussing the above problems/concerns.   ? ?Mar Daring, PA-C ?

## 2021-10-18 NOTE — Telephone Encounter (Signed)
?  Chief Complaint: Back sciatica pain ?Symptoms: back pain ?Frequency: 3 weeks ?Pertinent Negatives: Patient denies Weakness, numbness, bladder or bowel issues ?Disposition: ED /[] Urgent Care (no appt availability in office) / Appointment(In office/virtual)/  [facility]/ Home Care/ Refused Recommended Disposition /[] [facility]/  Follow-up with PCP ?Additional Notes: Pt was seen in office and given Aleve. Pt has gotten no relief from Aleve. Pt has been taking extra gabapentin as she read on the internet that it may help - mild relief with gabapentin. PT will now be short of her gabapentin this month. No appointments in office. Made virtual appt for pt.  ? ? ? ? ?Summary: sciatica pain  ? PT called with sciatica pain needing to be seen asap.  I checked with [facility] and there is nothing until April 12th.  Please [phone]   ?  ? ?

## 2021-11-04 ENCOUNTER — Other Ambulatory Visit: Payer: Self-pay

## 2021-11-07 ENCOUNTER — Other Ambulatory Visit: Payer: Self-pay

## 2021-12-09 ENCOUNTER — Other Ambulatory Visit: Payer: Self-pay | Admitting: Physician Assistant

## 2021-12-09 ENCOUNTER — Other Ambulatory Visit: Payer: Self-pay

## 2021-12-09 ENCOUNTER — Other Ambulatory Visit: Payer: Self-pay | Admitting: Family Medicine

## 2021-12-09 DIAGNOSIS — M5431 Sciatica, right side: Secondary | ICD-10-CM

## 2021-12-09 NOTE — Telephone Encounter (Unsigned)
Copied from [place] [phone]. Topic: General - Other ?>> Dec 09, 2021 11:27 AM Tessa Lerner A wrote: ?Reason for CRM: Medication Refill - Medication: Rx #: [id number]  ?gabapentin (NEURONTIN) 300 MG capsule [id number]  ? ?Rx #: [id number]  ?cyclobenzaprine (FLEXERIL) 5 MG tablet [id number]  ? ? ?Has the patient contacted their pharmacy? Yes.  The patient was directed to contact their PCP ?(Agent: If no, request that the patient contact the pharmacy for the refill. If patient does not wish to contact the pharmacy document the reason why and proceed with request.) ?(Agent: If yes, when and what did the pharmacy advise?) ? ?Preferred Pharmacy (with phone number or street name): [facility] [address], [place] [address] ?Phone: [phone] Fax: [phone] ?Hours: M-F 7:30a-6:00p ? ? ?Has the patient been seen for an appointment in the last year OR does the patient have an upcoming appointment? Yes.   ? ?Agent: Please be advised that RX refills may take up to 3 business days. We ask that you follow-up with your pharmacy. ?

## 2021-12-12 NOTE — Telephone Encounter (Signed)
Requested medication (s) are due for refill today:   Provider to review ? ?Requested medication (s) are on the active medication list:   Yes for both ? ?Future visit scheduled:   Yes tomorrow 5/16 with Madison Williams ? ? ?Last ordered: Flexeril 10/18/2021 #30, 1 refill;   gabapentin same ? ?Returned because non delegated refill.     ? ?Requested Prescriptions  ?Pending Prescriptions Disp Refills  ? cyclobenzaprine (FLEXERIL) 5 MG tablet 30 tablet 1  ?  Sig: Take 1-2 tablets (5-10 mg total) by mouth 3 (three) times daily as needed for muscle spasms.  ?  ? Not Delegated - Analgesics:  Muscle Relaxants Failed - 12/09/2021  1:15 PM  ?  ?  Failed - This refill cannot be delegated  ?  ?  Passed - Valid encounter within last 6 months  ?  Recent Outpatient Visits   ? ?      ? 2 months ago Sciatica of right side  ? [facility] [place], Jomarie Longs, [place]  ? 4 months ago Hyperlipidemia associated with type 2 diabetes mellitus (HCC)  ? [facility] Hoy Register, MD  ? 7 months ago Upper respiratory infection, viral  ? [facility] [place], [place] W, NP  ? 9 months ago Type 1 diabetes mellitus with hyperglycemia (HCC)  ? [facility] [place], Odette Horns, MD  ? 1 year ago Type 1 diabetes mellitus with hyperglycemia (HCC)  ? [facility] Hoy Register, MD  ? ?  ?  ?Future Appointments   ? ?        ? Tomorrow Claiborne Rigg, NP [facility]  ? ?  ? ? ?  ?  ?  ? gabapentin (NEURONTIN) 300 MG capsule 30 capsule 0  ?  Sig: Take 1-2 capsules (300-600 mg total) by mouth 2 (two) times daily as needed (pain).  ?  ? Neurology: Anticonvulsants - gabapentin Passed - 12/09/2021  1:15 PM  ?  ?  Passed - Cr in normal range and within 360 days  ?  Creatinine, Ser  ?Date Value Ref Range Status  ?07/06/2021 0.96 0.44 - 1.00 mg/dL Final  ?   ?  ?  Passed - Completed PHQ-2 or PHQ-9 in the  last 360 days  ?  ?  Passed - Valid encounter within last 12 months  ?  Recent Outpatient Visits   ? ?      ? 2 months ago Sciatica of right side  ? [facility] [place], Jomarie Long[place], [place]  ? 4 months ago Hyperlipidemia associated with type 2 diabetes mellitus (HCC)  ? [facility] Hoy Register, MD  ? 7 months ago Upper respiratory infection, viral  ? [facility] [place], [place] W, NP  ? 9 months ago Type 1 diabetes mellitus with hyperglycemia (HCC)  ? [facility] [place], Odette Horns, MD  ? 1 year ago Type 1 diabetes mellitus with hyperglycemia (HCC)  ? [facility] Hoy Register, MD  ? ?  ?  ?Future Appointments   ? ?        ? Tomorrow Claiborne Rigg, NP [facility]  ? ?  ? ? ?  ?  ?  ? ?

## 2021-12-13 ENCOUNTER — Telehealth (HOSPITAL_BASED_OUTPATIENT_CLINIC_OR_DEPARTMENT_OTHER): Payer: Medicaid Other | Admitting: Nurse Practitioner

## 2021-12-13 ENCOUNTER — Other Ambulatory Visit: Payer: Self-pay

## 2021-12-13 ENCOUNTER — Encounter: Payer: Self-pay | Admitting: Nurse Practitioner

## 2021-12-13 DIAGNOSIS — Z1159 Encounter for screening for other viral diseases: Secondary | ICD-10-CM

## 2021-12-13 DIAGNOSIS — E1065 Type 1 diabetes mellitus with hyperglycemia: Secondary | ICD-10-CM

## 2021-12-13 DIAGNOSIS — M5431 Sciatica, right side: Secondary | ICD-10-CM | POA: Diagnosis not present

## 2021-12-13 MED ORDER — GABAPENTIN 300 MG PO CAPS
300.0000 mg | ORAL_CAPSULE | Freq: Three times a day (TID) | ORAL | 1 refills | Status: DC
Start: 2021-12-13 — End: 2022-02-03
  Filled 2021-12-13: qty 60, 10d supply, fill #0
  Filled 2022-01-16: qty 60, 10d supply, fill #1

## 2021-12-13 MED ORDER — CYCLOBENZAPRINE HCL 5 MG PO TABS
5.0000 mg | ORAL_TABLET | Freq: Three times a day (TID) | ORAL | 1 refills | Status: DC | PRN
Start: 2021-12-13 — End: 2023-10-03
  Filled 2021-12-13: qty 30, 5d supply, fill #0

## 2021-12-13 NOTE — Progress Notes (Signed)
Virtual Visit Note ? I discussed the limitations, risks, security and privacy concerns of performing an evaluation and management service by video and the availability of in person appointments. I also discussed with the patient that there may be a patient responsible charge related to this service. The patient expressed understanding and agreed to proceed.  ? ? ?I connected with Doristine Counter on 12/13/21  at  11:10 AM EDT  EDT by VIDEO and verified that I am speaking with the correct person using two identifiers. ? ? ?Location of Patient: ?Private Residence ?  ?Location of Provider: ?Scientist, research (physical sciences) and [employer] Office  ?  ?Persons participating in VIRTUAL visit: ?Geryl Rankins FNP-BC ?Haileyann [employer]  ?  ?History of Present Illness: ?VIRTUAL visit for: Low back pain with right-sided sciatica ? ?She has complaints of persistent pain radiating from the lower part of her back all the way to the right foot.  Symptoms started a few months ago and unrelated to any injury or trauma.  Aggravating symptoms: Prolonged sitting.  She works at [employer], and 75% of the time sitting behind the desk.  She has a past medical history of anxiety and depression  for which she is currently taking gabapentin.   She had been previously prescribed naproxen however this was ineffective in relieving her pain.  On 10/18/2021 she had a virtual visit and at that time her gabapentin was increased to 2 capsules twice daily as needed pain and today she states with increase in gabapentin this is significantly helped to reduce her pain along with cyclobenzaprine.  She is requesting a refill and is aware that this is only a temporary increase in her gabapentin and any permanent increase will need to come from her PCP or psychiatrist.   ? ? ?Past Medical History:  ?Diagnosis Date  ? Asthma   ? Diabetes mellitus without complication ([place])   ? Rheumatoid arthritis ([place])   ?  ?Past Surgical History:  ?Procedure Laterality  Date  ? LAPAROSCOPIC OVARIAN    ? LEFT HEART CATH AND CORONARY ANGIOGRAPHY N/A 11/24/2019  ? Procedure: LEFT HEART CATH AND CORONARY ANGIOGRAPHY;  Surgeon: Burnell Blanks, MD;  Location: [facility];  Service: Cardiovascular;  Laterality: N/A;  ?  ?Family History  ?Family history unknown: Yes  ?  ?Social History  ? ?Socioeconomic History  ? Marital status: Single  ?  Spouse name: Not on file  ? Number of children: 1  ? Years of education: Not on file  ? Highest education level: Not on file  ?Occupational History  ? Not on file  ?Tobacco Use  ? Smoking status: Former  ?  Packs/day: 1.00  ?  Types: Cigarettes  ? Smokeless tobacco: Never  ?Vaping Use  ? Vaping Use: Every day  ? Substances: Nicotine  ?Substance and Sexual Activity  ? Alcohol use: Not Currently  ? Drug use: Yes  ?  Comment: [place]  ? Sexual activity: Not on file  ?Other Topics Concern  ? Not on file  ?Social History Narrative  ? Not on file  ? ?Social Determinants of Health  ? ?Financial Resource Strain: Not on file  ?Food Insecurity: Not on file  ?Transportation Needs: Not on file  ?Physical Activity: Not on file  ?Stress: Not on file  ?Social Connections: Not on file  ?  ? ?Observations/Objective: ?Awake, alert and oriented x 3 ? ? ?Review of Systems  ?Constitutional:  Negative for fever, malaise/fatigue and weight loss.  ?HENT:  Negative.  Negative for nosebleeds.   ?Eyes: Negative.  Negative for blurred vision, double vision and photophobia.  ?Respiratory: Negative.  Negative for cough and shortness of breath.   ?Cardiovascular: Negative.  Negative for chest pain, palpitations and leg swelling.  ?Gastrointestinal: Negative.  Negative for heartburn, nausea and vomiting.  ?Genitourinary:   ?     Denies any involuntary loss of urine or stool  ?Musculoskeletal:  Positive for back pain and myalgias.  ?Neurological:  Positive for sensory change. Negative for dizziness, focal weakness, seizures and headaches.  ?Psychiatric/Behavioral:  Negative.  Negative for suicidal ideas.    ?Assessment and Plan: ?Diagnoses and all orders for this visit: ? ?Sciatica, right side ?-     gabapentin (NEURONTIN) 300 MG capsule; Take 1-2 capsules (300-600 mg total) by mouth 3 (three) times daily. ?-     cyclobenzaprine (FLEXERIL) 5 MG tablet; Take 1-2 tablets (5-10 mg total) by mouth 3 (three) times daily as needed for muscle spasms. ?May also alternate with XS Tylenol for pain relief ? ?Need for hepatitis C screening test ?-     HCV Ab w Reflex to Quant PCR ? ?Type 1 diabetes mellitus with hyperglycemia (HCC) ?She is well overdue for follow-up with her PCP for her diabetes which is poorly controlled.  Appointment was made today for a follow-up in September ?-     CMP14+EGFR ?-     Hemoglobin A1c ? ?  ? ?Follow Up Instructions ?Return in about 3 months (around 03/15/2022), or if symptoms worsen or fail to improve.  ? ?  ?I discussed the assessment and treatment plan with the patient. The patient was provided an opportunity to ask questions and all were answered. The patient agreed with the plan and demonstrated an understanding of the instructions. ?  ?The patient was advised to call back or seek an in-person evaluation if the symptoms worsen or if the condition fails to improve as anticipated. ? ?I provided 12 minutes of face-to-face time during this encounter including median intraservice time, reviewing previous notes, labs, imaging, medications and explaining diagnosis and management. ? ?Gildardo Pounds, FNP-BC  ?

## 2021-12-14 ENCOUNTER — Other Ambulatory Visit: Payer: Self-pay

## 2021-12-30 ENCOUNTER — Other Ambulatory Visit: Payer: Self-pay

## 2021-12-30 ENCOUNTER — Encounter: Payer: Self-pay | Admitting: Family Medicine

## 2021-12-30 ENCOUNTER — Ambulatory Visit: Payer: Medicaid Other | Attending: Family Medicine | Admitting: Family Medicine

## 2021-12-30 DIAGNOSIS — J069 Acute upper respiratory infection, unspecified: Secondary | ICD-10-CM

## 2021-12-30 DIAGNOSIS — E1065 Type 1 diabetes mellitus with hyperglycemia: Secondary | ICD-10-CM | POA: Diagnosis not present

## 2021-12-30 MED ORDER — INSULIN GLARGINE 100 UNIT/ML ~~LOC~~ SOLN
32.0000 [IU] | Freq: Every day | SUBCUTANEOUS | 3 refills | Status: DC
  Filled 2021-12-30: qty 10, 31d supply, fill #0
  Filled 2022-01-15: qty 10, 28d supply, fill #0
  Filled 2022-02-23: qty 10, 28d supply, fill #1
  Filled 2022-03-22 – 2022-04-07 (×2): qty 10, 28d supply, fill #2

## 2021-12-30 NOTE — Progress Notes (Signed)
Virtual Visit via Telephone Note  I connected with Madison Williams, on 12/30/2021 at 1:34 PM by telephone and verified that I am speaking with the correct person using two identifiers.   Consent: I discussed the limitations, risks, security and privacy concerns of performing an evaluation and management service by telephone and the availability of in person appointments. I also discussed with the patient that there may be a patient responsible charge related to this service. The patient expressed understanding and agreed to proceed.   Location of Patient: Work  Government social research officerLocation of Provider: Clinic   Persons participating in Telemedicine visit: Madison Oysterevon Nichole Wolz Dr. Alvis LemmingsNewlin     History of Present Illness: Madison Williams is a 36 y.o. year old female with a history of hypertension, type 1 diabetes mellitus (A1c 9.6), rheumatoid arthritis, alcohol abuse, anxiety.   She had a sore throat on 12/19/21 with associated voice hoarseness, fever. She treated this with OTC cold remedies and symptoms have improved at the moment. This caused her to miss work 12/19/21 - 12/24/21 and she would like a note for work.  She takes 30 units of Lantus rather than 25 units on her med list and has noticed improvement in her sugars. Blood sugars are 150-175 fasting, prior to lunch sugars are in the 90s, highest sugars are in the 300s.  Past Medical History:  Diagnosis Date   Asthma    Diabetes mellitus without complication (HCC)    Rheumatoid arthritis (HCC)    No Known Allergies  Current Outpatient Medications on File Prior to Visit  Medication Sig Dispense Refill   amLODipine (NORVASC) 2.5 MG tablet Take 1 tablet (2.5 mg total) by mouth daily. 30 tablet 2   clonazePAM (KLONOPIN) 1 MG tablet Take 1 mg by mouth 2 (two) times daily as needed for anxiety.     cyclobenzaprine (FLEXERIL) 5 MG tablet Take 1-2 tablets (5-10 mg total) by mouth 3 (three) times daily as needed for muscle spasms. 30  tablet 1   FLUoxetine (PROZAC) 20 MG capsule Take 20 mg by mouth 2 (two) times daily.     gabapentin (NEURONTIN) 300 MG capsule Take 1-2 capsules (300-600 mg total) by mouth 3 (three) times daily. 60 capsule 1   ibuprofen (ADVIL) 200 MG tablet Take 200 mg by mouth every 6 (six) hours as needed for moderate pain.     insulin glargine (LANTUS) 100 UNIT/ML injection Inject 0.25 mLs (25 Units total) into the skin daily. 30 mL 3   insulin lispro (HUMALOG) 100 UNIT/ML injection Inject 0-0.09 mLs (0-9 Units total) into the skin 3 (three) times daily with meals. Per sliding scale 30 mL 3   Insulin Syringe-Needle U-100 (TRUEPLUS INSULIN SYRINGE) 31G X 5/16" 0.5 ML MISC Use to inject insulin daily. 100 each 6   Insulin Syringes, Disposable, U-100 0.5 ML MISC Use 4 times a day as directed 120 each 6   lamoTRIgine (LAMICTAL) 25 MG tablet Take 50 mg by mouth at bedtime.     metoprolol succinate (TOPROL-XL) 25 MG 24 hr tablet Take 25 mg by mouth daily.     pantoprazole (PROTONIX) 40 MG tablet Take 1 tablet (40 mg total) by mouth at bedtime. 30 tablet 2   promethazine (PHENERGAN) 25 MG tablet Take 1 tablet (25 mg total) by mouth every 6 (six) hours as needed for nausea or vomiting. 30 tablet 0   No current facility-administered medications on file prior to visit.    ROS: See HPI  Observations/Objective: Awake, alert,  oriented x3 Not in acute distress Normal mood      Latest Ref Rng & Units 07/06/2021    3:04 AM 07/05/2021    2:54 AM 07/04/2021    2:43 AM  CMP  Glucose 70 - 99 mg/dL 446   83   286     381    BUN 6 - 20 mg/dL 21   30   48     48    Creatinine 0.44 - 1.00 mg/dL 7.71   1.65   7.90     2.26    Sodium 135 - 145 mmol/L 141   147   145     144    Potassium 3.5 - 5.1 mmol/L 3.8   3.1   3.6     3.6    Chloride 98 - 111 mmol/L 111   114   109     109    CO2 22 - 32 mmol/L 20   23   21     20     Calcium 8.9 - 10.3 mg/dL 8.0   7.5   7.3     7.4    Total Protein 6.5 - 8.1 g/dL 6.2    5.8   6.2    Total Bilirubin 0.3 - 1.2 mg/dL 0.7   0.7   0.9    Alkaline Phos 38 - 126 U/L 80   73   96    AST 15 - 41 U/L 50   78   37    ALT 0 - 44 U/L 33   35   36      Lipid Panel     Component Value Date/Time   CHOL 275 (H) 02/05/2020 1152   TRIG 70 02/05/2020 1152   HDL 138 02/05/2020 1152   CHOLHDL 2.0 02/05/2020 1152   CHOLHDL 2.1 11/25/2019 0920   VLDL 16 11/25/2019 0920   LDLCALC 126 (H) 02/05/2020 1152   LABVLDL 11 02/05/2020 1152    Lab Results  Component Value Date   HGBA1C 9.6 (H) 07/03/2021    Assessment and Plan: 1. Type 1 diabetes mellitus with hyperglycemia (HCC) Uncontrolled with last A1c of 9.6 Advised that she is due for an office visit Based on blood sugar log I have advised her to increase Lantus from 38 to 32 units She has been advised to call the clinic to schedule a sooner appointment Counseled on Diabetic diet, my plate method, 383 minutes of moderate intensity exercise/week Blood sugar logs with fasting goals of 80-120 mg/dl, random of less than 338 and in the event of sugars less than 60 mg/dl or greater than 329 mg/dl encouraged to notify the clinic. Advised on the need for annual eye exams, annual foot exams, Pneumonia vaccine. - insulin glargine (LANTUS) 100 UNIT/ML injection; Inject 0.32 mLs (32 Units total) into the skin daily. Dose increase  Dispense: 30 mL; Refill: 3  2. Viral upper respiratory tract infection Symptoms have been improved Letter for work has been written   Follow Up Instructions: Advised to call the clinic to schedule an earlier appointment   I discussed the assessment and treatment plan with the patient. The patient was provided an opportunity to ask questions and all were answered. The patient agreed with the plan and demonstrated an understanding of the instructions.   The patient was advised to call back or seek an in-person evaluation if the symptoms worsen or if the condition fails to improve as  anticipated.  I provided 9 minutes total of non-face-to-face time during this encounter.   Hoy Register, MD, FAAFP. [facility] [place], [place] [phone]   12/30/2021, 1:34 PM

## 2022-01-16 ENCOUNTER — Other Ambulatory Visit: Payer: Self-pay

## 2022-01-29 ENCOUNTER — Other Ambulatory Visit: Payer: Self-pay | Admitting: Nurse Practitioner

## 2022-01-29 DIAGNOSIS — M5431 Sciatica, right side: Secondary | ICD-10-CM

## 2022-01-30 ENCOUNTER — Other Ambulatory Visit: Payer: Self-pay

## 2022-02-03 ENCOUNTER — Telehealth: Payer: Medicaid Other | Admitting: Physician Assistant

## 2022-02-03 ENCOUNTER — Telehealth: Payer: Medicaid Other | Admitting: Emergency Medicine

## 2022-02-03 ENCOUNTER — Other Ambulatory Visit: Payer: Self-pay

## 2022-02-03 DIAGNOSIS — M5431 Sciatica, right side: Secondary | ICD-10-CM

## 2022-02-03 MED ORDER — GABAPENTIN 300 MG PO CAPS
300.0000 mg | ORAL_CAPSULE | Freq: Three times a day (TID) | ORAL | 1 refills | Status: DC
  Filled 2022-02-03: qty 60, 10d supply, fill #0
  Filled 2022-02-17: qty 60, 10d supply, fill #1

## 2022-02-03 NOTE — Progress Notes (Signed)
Because this is a chronic, ongoing issue and you are requesting medication increases, I feel your condition warrants further evaluation and I recommend that you be seen in a face to face visit.  Please contact your primary care physician practice to be seen prior to scheduled follow up in September. Many offices offer virtual options to be seen via video if you are not comfortable going in person to a medical facility at this time.  NOTE: You will NOT be charged for this eVisit.  If you do not have a PCP, [facility] offers a free physician referral service available at [phone]. Our trained staff has the experience, knowledge and resources to put you in touch with a physician who is right for you.   If your Primary Care Office is unavailable, you can be seen at a local Urgent Care facility for further evaluation.   If you are having a true medical emergency please call 911.      For an urgent face to face visit, [facility] has seven urgent care centers for your convenience:     [facility] at [facility] Directions [phone] [address] [place], [place] [address]    [facility]) Get Driving Directions [phone] [address] [place], [place] [address]  [facility] Center [facility] [place]) Get Driving Directions [phone] [address] [place],  [place]  [address]  [facility] Center [facility] - at [facility] Directions  [phone] 409 067 [address] [place],  [place] [address]   [facility] Urgent Care at [facility] Get Driving Directions [phone] [address] [place], [place] [address]   [facility] Health Urgent Care at [facility] Get Driving Directions  [phone] [address] [place], [place] [address]   [facility] Urgent Care at [facility]  Directions [phone] [address] [place], [place] [address]  Your MyChart E-visit questionnaire answers were reviewed by a board certified advanced clinical practitioner to complete your personal care plan based on your specific symptoms.  Thank you for using e-Visits.   I provided 5 minutes of non face-to-face time during this encounter for chart review and documentation.

## 2022-02-03 NOTE — Patient Instructions (Signed)
Madison Williams, thank you for joining Cathlyn Parsons, NP for today's virtual visit.  While this provider is not your primary care provider (PCP), if your PCP is located in our provider database this encounter information will be shared with them immediately following your visit.  Consent: (Patient) Madison Williams provided verbal consent for this virtual visit at the beginning of the encounter.  Current Medications:  Current Outpatient Medications:    amLODipine (NORVASC) 2.5 MG tablet, Take 1 tablet (2.5 mg total) by mouth daily., Disp: 30 tablet, Rfl: 2   clonazePAM (KLONOPIN) 1 MG tablet, Take 1 mg by mouth 2 (two) times daily as needed for anxiety., Disp: , Rfl:    cyclobenzaprine (FLEXERIL) 5 MG tablet, Take 1-2 tablets (5-10 mg total) by mouth 3 (three) times daily as needed for muscle spasms., Disp: 30 tablet, Rfl: 1   FLUoxetine (PROZAC) 20 MG capsule, Take 20 mg by mouth 2 (two) times daily., Disp: , Rfl:    gabapentin (NEURONTIN) 300 MG capsule, Take 1-2 capsules (300-600 mg total) by mouth 3 (three) times daily., Disp: 60 capsule, Rfl: 1   ibuprofen (ADVIL) 200 MG tablet, Take 200 mg by mouth every 6 (six) hours as needed for moderate pain., Disp: , Rfl:    insulin glargine (LANTUS) 100 UNIT/ML injection, Inject 0.32 mLs (32 Units total) into the skin daily. Dose increase, Disp: 30 mL, Rfl: 3   insulin lispro (HUMALOG) 100 UNIT/ML injection, Inject 0-0.09 mLs (0-9 Units total) into the skin 3 (three) times daily with meals. Per sliding scale, Disp: 30 mL, Rfl: 3   Insulin Syringe-Needle U-100 (TRUEPLUS INSULIN SYRINGE) 31G X 5/16" 0.5 ML MISC, Use to inject insulin daily., Disp: 100 each, Rfl: 6   Insulin Syringes, Disposable, U-100 0.5 ML MISC, Use 4 times a day as directed, Disp: 120 each, Rfl: 6   lamoTRIgine (LAMICTAL) 25 MG tablet, Take 50 mg by mouth at bedtime., Disp: , Rfl:    metoprolol succinate (TOPROL-XL) 25 MG 24 hr tablet, Take 25 mg by mouth daily., Disp: ,  Rfl:    pantoprazole (PROTONIX) 40 MG tablet, Take 1 tablet (40 mg total) by mouth at bedtime., Disp: 30 tablet, Rfl: 2   promethazine (PHENERGAN) 25 MG tablet, Take 1 tablet (25 mg total) by mouth every 6 (six) hours as needed for nausea or vomiting., Disp: 30 tablet, Rfl: 0   Medications ordered in this encounter:  Meds ordered this encounter  Medications   gabapentin (NEURONTIN) 300 MG capsule    Sig: Take 1-2 capsules (300-600 mg total) by mouth 3 (three) times daily.    Dispense:  60 capsule    Refill:  1     *If you need refills on other medications prior to your next appointment, please contact your pharmacy*  Follow-Up: Call back or seek an in-person evaluation if the symptoms worsen or if the condition fails to improve as anticipated.  Other Instructions I believe your symptoms are serious enough that you should be seen in the emergency room today.  After this acute emergency is treated, you may need follow-up with sports medicine .  You can try [place] sports medicine at [facility].  Their phone number is [phone].  You can also try the [facility] health sports medicine center.  Their phone number is [phone].   If you have been instructed to have an in-person evaluation today at a local Urgent Care facility, please use the link below. It will take you to a list  of all of our available [facility], including address, phone number and hours of operation. Please do not delay care.  [facility]  If you or a family member do not have a primary care provider, use the link below to schedule a visit and establish care. When you choose a [facility] primary care physician or advanced practice provider, you gain a long-term partner in health. Find a Primary Care Provider  Learn more about [facility]'s in-office and virtual care options: [facility]

## 2022-02-03 NOTE — Progress Notes (Signed)
Virtual Visit Consent   Madison Williams, you are scheduled for a virtual visit with a [facility] provider today. Just as with appointments in the office, your consent must be obtained to participate. Your consent will be active for this visit and any virtual visit you may have with one of our providers in the next 365 days. If you have a MyChart account, a copy of this consent can be sent to you electronically.  As this is a virtual visit, video technology does not allow for your provider to perform a traditional examination. This may limit your provider's ability to fully assess your condition. If your provider identifies any concerns that need to be evaluated in person or the need to arrange testing (such as labs, EKG, etc.), we will make arrangements to do so. Although advances in technology are sophisticated, we cannot ensure that it will always work on either your end or our end. If the connection with a video visit is poor, the visit may have to be switched to a telephone visit. With either a video or telephone visit, we are not always able to ensure that we have a secure connection.  By engaging in this virtual visit, you consent to the provision of healthcare and authorize for your insurance to be billed (if applicable) for the services provided during this visit. Depending on your insurance coverage, you may receive a charge related to this service.  I need to obtain your verbal consent now. Are you willing to proceed with your visit today? Madison Williams has provided verbal consent on 02/03/2022 for a virtual visit (video or telephone). Cathlyn Parsons, NP  Date: 02/03/2022 11:44 AM  Virtual Visit via Video Note   I, Cathlyn Parsons, connected with  Madison Williams  ([id number], Sep 02, 1985) on 02/03/22 at 11:30 AM EDT by a video-enabled telemedicine application and verified that I am speaking with the correct person using two identifiers.  Location: Patient:  Provider:  Virtual Visit Location Provider: Other: work   I discussed the limitations of evaluation and management by telemedicine and the availability of in person appointments. The patient expressed understanding and agreed to proceed.    History of Present Illness: Madison Williams is a 36 y.o. who identifies as a female who was assigned female at birth, and is being seen today for possible sciatica.  Patient reports she has had this problem intermittently since March current episode flare has been for the last 2 days.  Pain and numbness starts in her right upper back travels down her back into her buttocks and then down her right lower extremity.  The thing that has helped her most has been gabapentin.  She is almost out of it and requests refill of her prescription.  She also has tried Tylenol and ibuprofen for some relief.  But beyond the gabapentin, the thing that helps her most is the application of heat such as with a heating pad.  She reports that her right foot is weaker than her left and she cannot use it as well as normal or as well as her left.  She also reports that in the last couple of days she has experienced a change of bowel and bladder habits where she does not have the same control and when she needs to use the bathroom the sensation is an abrupt onset and she has to rush to get to the bathroom did not have an accident.  This is abnormal for her.  HPI:  HPI  Problems:  Patient Active Problem List   Diagnosis Date Noted   Hyperkalemia 07/03/2021   SIRS (systemic inflammatory response syndrome) (HCC) 07/03/2021   Hypermagnesemia 07/03/2021   Hyperphosphatemia 07/03/2021   Septic shock (HCC)    Rheumatoid arthritis (HCC)    Substance withdrawal (HCC) 01/14/2020   SOB (shortness of breath)    Hypokalemia    Macrocytosis without anemia    Retroperitoneal hematoma    DKA (diabetic ketoacidosis) (HCC) 11/24/2019   Acute ST elevation myocardial infarction (STEMI) of anterolateral wall  (HCC)    Abnormal EKG    Elevated troponin    Nausea & vomiting 10/16/2019   Abdominal pain 10/16/2019   Chest tightness    Mild intermittent asthma without complication    Acute vomiting 01/31/2019   Type 1 diabetes mellitus with hyperglycemia (HCC) 01/31/2019   Uninsured 01/31/2019   DKA (diabetic ketoacidoses) 03/30/2018   Acute renal failure (HCC) 03/30/2018   Leukocytosis 03/30/2018   Dehydration 03/30/2018   Tobacco use disorder 03/30/2018    Allergies: No Known Allergies Medications:  Current Outpatient Medications:    amLODipine (NORVASC) 2.5 MG tablet, Take 1 tablet (2.5 mg total) by mouth daily., Disp: 30 tablet, Rfl: 2   clonazePAM (KLONOPIN) 1 MG tablet, Take 1 mg by mouth 2 (two) times daily as needed for anxiety., Disp: , Rfl:    cyclobenzaprine (FLEXERIL) 5 MG tablet, Take 1-2 tablets (5-10 mg total) by mouth 3 (three) times daily as needed for muscle spasms., Disp: 30 tablet, Rfl: 1   FLUoxetine (PROZAC) 20 MG capsule, Take 20 mg by mouth 2 (two) times daily., Disp: , Rfl:    gabapentin (NEURONTIN) 300 MG capsule, Take 1-2 capsules (300-600 mg total) by mouth 3 (three) times daily., Disp: 60 capsule, Rfl: 1   ibuprofen (ADVIL) 200 MG tablet, Take 200 mg by mouth every 6 (six) hours as needed for moderate pain., Disp: , Rfl:    insulin glargine (LANTUS) 100 UNIT/ML injection, Inject 0.32 mLs (32 Units total) into the skin daily. Dose increase, Disp: 30 mL, Rfl: 3   insulin lispro (HUMALOG) 100 UNIT/ML injection, Inject 0-0.09 mLs (0-9 Units total) into the skin 3 (three) times daily with meals. Per sliding scale, Disp: 30 mL, Rfl: 3   Insulin Syringe-Needle U-100 (TRUEPLUS INSULIN SYRINGE) 31G X 5/16" 0.5 ML MISC, Use to inject insulin daily., Disp: 100 each, Rfl: 6   Insulin Syringes, Disposable, U-100 0.5 ML MISC, Use 4 times a day as directed, Disp: 120 each, Rfl: 6   lamoTRIgine (LAMICTAL) 25 MG tablet, Take 50 mg by mouth at bedtime., Disp: , Rfl:    metoprolol  succinate (TOPROL-XL) 25 MG 24 hr tablet, Take 25 mg by mouth daily., Disp: , Rfl:    pantoprazole (PROTONIX) 40 MG tablet, Take 1 tablet (40 mg total) by mouth at bedtime., Disp: 30 tablet, Rfl: 2   promethazine (PHENERGAN) 25 MG tablet, Take 1 tablet (25 mg total) by mouth every 6 (six) hours as needed for nausea or vomiting., Disp: 30 tablet, Rfl: 0  Observations/Objective: Patient is well-developed, well-nourished in no acute distress.  Resting comfortably  at work.  Head is normocephalic, atraumatic.  No labored breathing.  Speech is clear and coherent with logical content.  Patient is alert and oriented at baseline.  Patient is walking around while on video visit.  Assessment and Plan: 1. Sciatica, right side - gabapentin (NEURONTIN) 300 MG capsule; Take 1-2 capsules (300-600 mg total) by mouth 3 (three) times daily.  Dispense: 60 capsule; Refill: 1  Based on symptoms, I think patient needs to be seen emergently in the ER.  I explained to patient why and advised her to go.  Not confident that she will go.  And I am not sure what kind of prior care she has had for this problem.  I did share contact information for sports medicine clinics with her.  I did refill her gabapentin as requested.  I do believe she needs to be seen in the ER.  Follow Up Instructions: I discussed the assessment and treatment plan with the patient. The patient was provided an opportunity to ask questions and all were answered. The patient agreed with the plan and demonstrated an understanding of the instructions.  A copy of instructions were sent to the patient via MyChart unless otherwise noted below.   The patient was advised to call back or seek an in-person evaluation if the symptoms worsen or if the condition fails to improve as anticipated.  Time:  I spent 14 minutes with the patient via telehealth technology discussing the above problems/concerns.    Carvel Getting, NP

## 2022-02-17 ENCOUNTER — Other Ambulatory Visit: Payer: Self-pay

## 2022-02-23 ENCOUNTER — Other Ambulatory Visit: Payer: Self-pay

## 2022-02-27 ENCOUNTER — Other Ambulatory Visit: Payer: Self-pay

## 2022-03-22 ENCOUNTER — Other Ambulatory Visit: Payer: Self-pay

## 2022-03-29 ENCOUNTER — Telehealth: Payer: Medicaid Other | Admitting: Family

## 2022-03-29 ENCOUNTER — Other Ambulatory Visit: Payer: Self-pay

## 2022-03-29 DIAGNOSIS — M5431 Sciatica, right side: Secondary | ICD-10-CM

## 2022-03-29 DIAGNOSIS — M5441 Lumbago with sciatica, right side: Secondary | ICD-10-CM | POA: Diagnosis not present

## 2022-03-29 MED ORDER — GABAPENTIN 300 MG PO CAPS
300.0000 mg | ORAL_CAPSULE | Freq: Three times a day (TID) | ORAL | 0 refills | Status: DC
Start: 2022-03-29 — End: 2022-04-11
  Filled 2022-03-29: qty 120, 20d supply, fill #0

## 2022-03-29 NOTE — Progress Notes (Signed)
Virtual Visit Consent   Madison Williams, you are scheduled for a virtual visit with a  provider today. Just as with appointments in the office, your consent must be obtained to participate. Your consent will be active for this visit and any virtual visit you may have with one of our providers in the next 365 days. If you have a MyChart account, a copy of this consent can be sent to you electronically.  As this is a virtual visit, video technology does not allow for your provider to perform a traditional examination. This may limit your provider's ability to fully assess your condition. If your provider identifies any concerns that need to be evaluated in person or the need to arrange testing (such as labs, EKG, etc.), we will make arrangements to do so. Although advances in technology are sophisticated, we cannot ensure that it will always work on either your end or our end. If the connection with a video visit is poor, the visit may have to be switched to a telephone visit. With either a video or telephone visit, we are not always able to ensure that we have a secure connection.  By engaging in this virtual visit, you consent to the provision of healthcare and authorize for your insurance to be billed (if applicable) for the services provided during this visit. Depending on your insurance coverage, you may receive a charge related to this service.  I need to obtain your verbal consent now. Are you willing to proceed with your visit today? [name] has provided verbal consent on 03/29/2022 for a virtual visit (video or telephone). Jannifer Rodney, FNP  Date: 03/29/2022 11:21 AM  Virtual Visit via Video Note   I, Jannifer Rodney, connected with  Madison Williams  ([id number], 07-24-1986) on 03/29/22 at 11:00 AM EDT by a video-enabled telemedicine application and verified that I am speaking with the correct person using two identifiers.  Location: Patient: Virtual Visit  Location Patient: Other: work Provider: Pharmacist, community: Home Office   I discussed the limitations of evaluation and management by telemedicine and the availability of in person appointments. The patient expressed understanding and agreed to proceed.    History of Present Illness: Madison Williams is a 36 y.o. who identifies as a female who was assigned female at birth, and is being seen today for back pain and sciatic of right leg.  She is taking gabapentin that her behavorial health provide gives her and states this helps the most. However, when she has a flare up she has to take 600 mg TID prn. However, only prescribed 300 mg TID prn.   She has a follow up with her PCP 04/10/22 for this pain.  HPI: Back Pain This is a recurrent problem. The current episode started 1 to 4 weeks ago. The problem occurs constantly. The problem has been waxing and waning since onset. The pain is present in the gluteal. The quality of the pain is described as aching. The pain radiates to the right thigh. The pain is moderate. The symptoms are aggravated by twisting and bending. Associated symptoms include leg pain, tingling and weakness. She has tried bed rest and NSAIDs for the symptoms. The treatment provided mild relief.    Problems:  Patient Active Problem List   Diagnosis Date Noted   Hyperkalemia 07/03/2021   SIRS (systemic inflammatory response syndrome) (HCC) 07/03/2021   Hypermagnesemia 07/03/2021   Hyperphosphatemia 07/03/2021   Septic shock (HCC)    Rheumatoid  arthritis (HCC)    Substance withdrawal (HCC) 01/14/2020   SOB (shortness of breath)    Hypokalemia    Macrocytosis without anemia    Retroperitoneal hematoma    DKA (diabetic ketoacidosis) (HCC) 11/24/2019   Acute ST elevation myocardial infarction (STEMI) of anterolateral wall (HCC)    Abnormal EKG    Elevated troponin    Nausea & vomiting 10/16/2019   Abdominal pain 10/16/2019   Chest tightness    Mild  intermittent asthma without complication    Acute vomiting 01/31/2019   Type 1 diabetes mellitus with hyperglycemia (HCC) 01/31/2019   Uninsured 01/31/2019   DKA (diabetic ketoacidoses) 03/30/2018   Acute renal failure (HCC) 03/30/2018   Leukocytosis 03/30/2018   Dehydration 03/30/2018   Tobacco use disorder 03/30/2018    Allergies: No Known Allergies Medications:  Current Outpatient Medications:    amLODipine (NORVASC) 2.5 MG tablet, Take 1 tablet (2.5 mg total) by mouth daily., Disp: 30 tablet, Rfl: 2   clonazePAM (KLONOPIN) 1 MG tablet, Take 1 mg by mouth 2 (two) times daily as needed for anxiety., Disp: , Rfl:    cyclobenzaprine (FLEXERIL) 5 MG tablet, Take 1-2 tablets (5-10 mg total) by mouth 3 (three) times daily as needed for muscle spasms., Disp: 30 tablet, Rfl: 1   FLUoxetine (PROZAC) 20 MG capsule, Take 20 mg by mouth 2 (two) times daily., Disp: , Rfl:    gabapentin (NEURONTIN) 300 MG capsule, Take 1-2 capsules (300-600 mg total) by mouth 3 (three) times daily., Disp: 120 capsule, Rfl: 0   ibuprofen (ADVIL) 200 MG tablet, Take 200 mg by mouth every 6 (six) hours as needed for moderate pain., Disp: , Rfl:    insulin glargine (LANTUS) 100 UNIT/ML injection, Inject 0.32 mLs (32 Units total) into the skin daily. Dose increase, Disp: 30 mL, Rfl: 3   insulin lispro (HUMALOG) 100 UNIT/ML injection, Inject 0-0.09 mLs (0-9 Units total) into the skin 3 (three) times daily with meals. Per sliding scale, Disp: 30 mL, Rfl: 3   Insulin Syringe-Needle U-100 (TRUEPLUS INSULIN SYRINGE) 31G X 5/16" 0.5 ML MISC, Use to inject insulin daily., Disp: 100 each, Rfl: 6   Insulin Syringes, Disposable, U-100 0.5 ML MISC, Use 4 times a day as directed, Disp: 120 each, Rfl: 6   lamoTRIgine (LAMICTAL) 25 MG tablet, Take 50 mg by mouth at bedtime., Disp: , Rfl:    metoprolol succinate (TOPROL-XL) 25 MG 24 hr tablet, Take 25 mg by mouth daily., Disp: , Rfl:    pantoprazole (PROTONIX) 40 MG tablet, Take 1 tablet  (40 mg total) by mouth at bedtime., Disp: 30 tablet, Rfl: 2   promethazine (PHENERGAN) 25 MG tablet, Take 1 tablet (25 mg total) by mouth every 6 (six) hours as needed for nausea or vomiting., Disp: 30 tablet, Rfl: 0  Observations/Objective: Patient is well-developed, well-nourished in no acute distress.  Resting comfortably  at work Head is normocephalic, atraumatic.  No labored breathing.  Speech is clear and coherent with logical content.  Patient is alert and oriented at baseline.    Assessment and Plan: 1. Acute right-sided low back pain with right-sided sciatica  2. Sciatica, right side - gabapentin (NEURONTIN) 300 MG capsule; Take 1-2 capsules (300-600 mg total) by mouth 3 (three) times daily.  Dispense: 120 capsule; Refill: 0  Rest ROM exercises encouraged  Continue NSAIDs Keep follow up with PCP as she may need referral to PT, ortho, or imaging.  Follow Up Instructions: I discussed the assessment and treatment plan with  the patient. The patient was provided an opportunity to ask questions and all were answered. The patient agreed with the plan and demonstrated an understanding of the instructions.  A copy of instructions were sent to the patient via MyChart unless otherwise noted below.     The patient was advised to call back or seek an in-person evaluation if the symptoms worsen or if the condition fails to improve as anticipated.  Time:  I spent 13 minutes with the patient via telehealth technology discussing the above problems/concerns.    Jannifer Rodney, FNP

## 2022-04-07 ENCOUNTER — Other Ambulatory Visit: Payer: Self-pay

## 2022-04-10 ENCOUNTER — Telehealth: Payer: Self-pay | Admitting: Family Medicine

## 2022-04-10 ENCOUNTER — Ambulatory Visit: Payer: Medicaid Other | Attending: Family Medicine | Admitting: Family Medicine

## 2022-04-10 ENCOUNTER — Encounter: Payer: Self-pay | Admitting: Family Medicine

## 2022-04-10 ENCOUNTER — Other Ambulatory Visit: Payer: Self-pay

## 2022-04-10 VITALS — BP 102/70 | HR 101 | Ht 67.0 in | Wt 130.8 lb

## 2022-04-10 DIAGNOSIS — Z794 Long term (current) use of insulin: Secondary | ICD-10-CM | POA: Insufficient documentation

## 2022-04-10 DIAGNOSIS — M5431 Sciatica, right side: Secondary | ICD-10-CM | POA: Insufficient documentation

## 2022-04-10 DIAGNOSIS — F1010 Alcohol abuse, uncomplicated: Secondary | ICD-10-CM | POA: Insufficient documentation

## 2022-04-10 DIAGNOSIS — F32A Depression, unspecified: Secondary | ICD-10-CM | POA: Insufficient documentation

## 2022-04-10 DIAGNOSIS — E1065 Type 1 diabetes mellitus with hyperglycemia: Secondary | ICD-10-CM | POA: Diagnosis present

## 2022-04-10 DIAGNOSIS — F419 Anxiety disorder, unspecified: Secondary | ICD-10-CM | POA: Diagnosis not present

## 2022-04-10 DIAGNOSIS — Z79899 Other long term (current) drug therapy: Secondary | ICD-10-CM | POA: Insufficient documentation

## 2022-04-10 LAB — POCT GLYCOSYLATED HEMOGLOBIN (HGB A1C): Hemoglobin A1C: 10.8 % — AB (ref 4.0–5.6)

## 2022-04-10 MED ORDER — INSULIN LISPRO 100 UNIT/ML IJ SOLN
0.0000 [IU] | Freq: Three times a day (TID) | INTRAMUSCULAR | 3 refills | Status: DC
Start: 2022-04-10 — End: 2023-06-01
  Filled 2022-04-10 – 2022-04-11 (×2): qty 30, 84d supply, fill #0
  Filled 2022-07-05 – 2022-08-02 (×3): qty 30, 84d supply, fill #1
  Filled 2023-02-06: qty 30, 84d supply, fill #2

## 2022-04-10 MED ORDER — INSULIN GLARGINE 100 UNIT/ML ~~LOC~~ SOLN
SUBCUTANEOUS | 3 refills | Status: DC
  Filled 2022-04-10: qty 30, fill #0
  Filled 2022-04-11: qty 30, 75d supply, fill #0
  Filled 2022-07-05 – 2022-08-02 (×2): qty 30, 75d supply, fill #1
  Filled 2022-11-20: qty 30, 75d supply, fill #2
  Filled 2023-02-06 (×2): qty 30, 75d supply, fill #3

## 2022-04-10 NOTE — Telephone Encounter (Signed)
FYI, I referred her to [facility] due to her poor satisfaction with the high turnover at the [facility].  If you could please ensure she gets connected.  She already has a therapist.  Thank you.

## 2022-04-10 NOTE — Progress Notes (Signed)
Subjective:  Patient ID: Madison Williams, female    DOB: 1986/02/13  Age: 36 y.o. MRN: [id number]  CC: Diabetes   HPI Madison Williams is a 36 y.o. year old female with a history of hypertension, type 1 diabetes mellitus (A1c 9.6), rheumatoid arthritis, alcohol abuse, anxiety.  Interval History:  Fasting sugar was 87 but she endorses that sometimes her postprandial sugars get up to 300. Her cat passed away a month ago and she has not been adherent with a diabetic diet. She eats a lot of rice and pasta. She administers 30 units qhs and her Humalog is usually 5-7 units with meals but sometimes forgets to administer her Humalog and at other times she gets Depressed and does not feel like taking it.  She has been going to the [facility] but is not happy that they change Clinicians a lot. She sees a therapist with an appointment coming up this week.  She does have sciatica for which she has had telehealth visits.  Pain radiates from her right lower back down her posterior right lower extremity and sometimes radiates up to the right side of her back.  She was prescribed Flexeril and gabapentin which she has been taking. Past Medical History:  Diagnosis Date   Asthma    Diabetes mellitus without complication (HCC)    Rheumatoid arthritis (HCC)     Past Surgical History:  Procedure Laterality Date   LAPAROSCOPIC OVARIAN     LEFT HEART CATH AND CORONARY ANGIOGRAPHY N/A 11/24/2019   Procedure: LEFT HEART CATH AND CORONARY ANGIOGRAPHY;  Surgeon: Kathleene Hazel, MD;  Location: [facility];  Service: Cardiovascular;  Laterality: N/A;    Family History  Family history unknown: Yes    Social History   Socioeconomic History   Marital status: Single    Spouse name: Not on file   Number of children: 1   Years of education: Not on file   Highest education level: Not on file  Occupational History   Not on file  Tobacco Use   Smoking status: Former     Packs/day: 1.00    Types: Cigarettes   Smokeless tobacco: Never  Vaping Use   Vaping Use: Every day   Substances: Nicotine  Substance and Sexual Activity   Alcohol use: Not Currently   Drug use: Yes    Comment: Kratum   Sexual activity: Not on file  Other Topics Concern   Not on file  Social History Narrative   Not on file   Social Determinants of Health   Financial Resource Strain: Not on file  Food Insecurity: Not on file  Transportation Needs: Not on file  Physical Activity: Not on file  Stress: Not on file  Social Connections: Not on file    No Known Allergies  Outpatient Medications Prior to Visit  Medication Sig Dispense Refill   clonazePAM (KLONOPIN) 1 MG tablet Take 1 mg by mouth 2 (two) times daily as needed for anxiety.     FLUoxetine (PROZAC) 20 MG capsule Take 20 mg by mouth 2 (two) times daily.     gabapentin (NEURONTIN) 300 MG capsule Take 1-2 capsules (300-600 mg total) by mouth 3 (three) times daily. 120 capsule 0   ibuprofen (ADVIL) 200 MG tablet Take 200 mg by mouth every 6 (six) hours as needed for moderate pain.     Insulin Syringe-Needle U-100 (TRUEPLUS INSULIN SYRINGE) 31G X 5/16" 0.5 ML MISC Use to inject insulin daily. 100 each 6  Insulin Syringes, Disposable, U-100 0.5 ML MISC Use 4 times a day as directed 120 each 6   promethazine (PHENERGAN) 25 MG tablet Take 1 tablet (25 mg total) by mouth every 6 (six) hours as needed for nausea or vomiting. 30 tablet 0   insulin glargine (LANTUS) 100 UNIT/ML injection Inject 0.32 mLs (32 Units total) into the skin daily. Dose increase 30 mL 3   insulin lispro (HUMALOG) 100 UNIT/ML injection Inject 0-0.09 mLs (0-9 Units total) into the skin 3 (three) times daily with meals. Per sliding scale 30 mL 3   cyclobenzaprine (FLEXERIL) 5 MG tablet Take 1-2 tablets (5-10 mg total) by mouth 3 (three) times daily as needed for muscle spasms. (Patient not taking: Reported on 04/10/2022) 30 tablet 1   amLODipine (NORVASC) 2.5  MG tablet Take 1 tablet (2.5 mg total) by mouth daily. 30 tablet 2   lamoTRIgine (LAMICTAL) 25 MG tablet Take 50 mg by mouth at bedtime.     metoprolol succinate (TOPROL-XL) 25 MG 24 hr tablet Take 25 mg by mouth daily.     pantoprazole (PROTONIX) 40 MG tablet Take 1 tablet (40 mg total) by mouth at bedtime. 30 tablet 2   No facility-administered medications prior to visit.     ROS Review of Systems  Constitutional:  Negative for activity change, appetite change and fatigue.  HENT:  Negative for congestion, sinus pressure and sore throat.   Eyes:  Negative for visual disturbance.  Respiratory:  Negative for cough, chest tightness, shortness of breath and wheezing.   Cardiovascular:  Negative for chest pain and palpitations.  Gastrointestinal:  Negative for abdominal distention, abdominal pain and constipation.  Endocrine: Negative for polydipsia.  Genitourinary:  Negative for dysuria and frequency.  Musculoskeletal:        See HPI  Skin:  Negative for rash.  Neurological:  Negative for tremors, light-headedness and numbness.  Hematological:  Does not bruise/bleed easily.  Psychiatric/Behavioral:  Positive for dysphoric mood. Negative for agitation and behavioral problems.     Objective:  BP 102/70   Pulse (!) 101   Ht 5\' 7"  (1.702 m)   Wt 130 lb 12.8 oz (59.3 kg)   LMP 03/16/2018 Comment: negative urine preg test 01/14/20  SpO2 97%   BMI 20.49 kg/m      04/10/2022    4:02 PM 10/13/2021    4:05 PM 07/07/2021    4:15 AM  BP/Weight  Systolic BP 102 115 114  Diastolic BP 70 77 87  Wt. (Lbs) 130.8 133   BMI 20.49 kg/m2 20.83 kg/m2       Physical Exam Constitutional:      Appearance: She is well-developed.  Cardiovascular:     Rate and Rhythm: Normal rate.     Heart sounds: Normal heart sounds. No murmur heard. Pulmonary:     Effort: Pulmonary effort is normal.     Breath sounds: Normal breath sounds. No wheezing or rales.  Chest:     Chest wall: No tenderness.   Abdominal:     General: Bowel sounds are normal. There is no distension.     Palpations: Abdomen is soft. There is no mass.     Tenderness: There is no abdominal tenderness.  Musculoskeletal:        General: Normal range of motion.     Right lower leg: No edema.     Left lower leg: No edema.     Comments: Positive straight leg raise on the right  Neurological:  Mental Status: She is alert and oriented to person, place, and time.  Psychiatric:     Comments: Dysphoric mood    Diabetic Foot Exam - Simple   Simple Foot Form Diabetic Foot exam was performed with the following findings: Yes 04/10/2022  4:45 PM  Visual Inspection No deformities, no ulcerations, no other skin breakdown bilaterally: Yes Sensation Testing Intact to touch and monofilament testing bilaterally: Yes Pulse Check Posterior Tibialis and Dorsalis pulse intact bilaterally: Yes Comments        Latest Ref Rng & Units 07/06/2021    3:04 AM 07/05/2021    2:54 AM 07/04/2021    2:43 AM  CMP  Glucose 70 - 99 mg/dL 361  83  443    154   BUN 6 - 20 mg/dL 21  30  48    48   Creatinine 0.44 - 1.00 mg/dL 0.08  6.76  1.95    0.93   Sodium 135 - 145 mmol/L 141  147  145    144   Potassium 3.5 - 5.1 mmol/L 3.8  3.1  3.6    3.6   Chloride 98 - 111 mmol/L 111  114  109    109   CO2 22 - 32 mmol/L 20  23  21    20    Calcium 8.9 - 10.3 mg/dL 8.0  7.5  7.3    7.4   Total Protein 6.5 - 8.1 g/dL 6.2  5.8  6.2   Total Bilirubin 0.3 - 1.2 mg/dL 0.7  0.7  0.9   Alkaline Phos 38 - 126 U/L 80  73  96   AST 15 - 41 U/L 50  78  37   ALT 0 - 44 U/L 33  35  36     Lipid Panel     Component Value Date/Time   CHOL 275 (H) 02/05/2020 1152   TRIG 70 02/05/2020 1152   HDL 138 02/05/2020 1152   CHOLHDL 2.0 02/05/2020 1152   CHOLHDL 2.1 11/25/2019 0920   VLDL 16 11/25/2019 0920   LDLCALC 126 (H) 02/05/2020 1152    CBC    Component Value Date/Time   WBC 4.2 07/06/2021 0304   RBC 3.65 (L) 07/06/2021 0304   HGB 11.0  (L) 07/06/2021 0304   HCT 33.4 (L) 07/06/2021 0304   PLT 105 (L) 07/06/2021 0304   MCV 91.5 07/06/2021 0304   MCH 30.1 07/06/2021 0304   MCHC 32.9 07/06/2021 0304   RDW 16.0 (H) 07/06/2021 0304   LYMPHSABS 4.1 (H) 07/03/2021 1025   MONOABS 1.7 (H) 07/03/2021 1025   EOSABS 0.0 07/03/2021 1025   BASOSABS 0.1 07/03/2021 1025    Lab Results  Component Value Date   HGBA1C 10.8 (A) 04/10/2022    Assessment & Plan:  1. Type 1 diabetes mellitus with hyperglycemia (HCC) Uncontrolled with A1c of 10.8; goal is less than 7.0 Nonadherence with diabetic diet and Humalog contributing I will add on a second dose of Lantus 10 unit in the mornings due to the fact that she forgets to take her Humalog during the day and she will continue her nighttime Lantus of 30 units. She will benefit from an insulin pump and has been referred to endocrinology Depression also playing a role in compliance Counseled on Diabetic diet, my plate method, 06/10/2022 minutes of moderate intensity exercise/week Blood sugar logs with fasting goals of 80-120 mg/dl, random of less than 267 and in the event of sugars less than 60 mg/dl or  greater than 400 mg/dl encouraged to notify the clinic. Advised on the need for annual eye exams, annual foot exams, Pneumonia vaccine. Due for lipid panel.  Will have this drawn at her next visit when she is fasting - POCT glycosylated hemoglobin (Hb A1C) - Ambulatory referral to Endocrinology - Microalbumin/Creatinine Ratio, Urine - insulin glargine (LANTUS) 100 UNIT/ML injection; Inject subcutaneously twice daily 30 units at bedtime and 10 units in the morning  Dispense: 30 mL; Refill: 3 - insulin lispro (HUMALOG) 100 UNIT/ML injection; Inject 0-0.12 mLs (0-12 Units total) into the skin 3 (three) times daily with meals. Per sliding scale  Dispense: 30 mL; Refill: 3 - Ambulatory referral to Ophthalmology  2. Sciatica of right side Uncontrolled She will benefit from initiation of Cymbalta but is  currently on Prozac.  Advised to discuss this with her psychiatrist Continue gabapentin Demonstrated stretching exercises - Ambulatory referral to Physical Therapy  3. Anxiety and depression Uncontrolled She is unhappy with the frequent turnover of psychiatrist at the [facility] We will refer to neuropsychiatry associated - Ambulatory referral to Psychiatry    Meds ordered this encounter  Medications   insulin glargine (LANTUS) 100 UNIT/ML injection    Sig: Inject subcutaneously twice daily 30 units at bedtime and 10 units in the morning    Dispense:  30 mL    Refill:  3   insulin lispro (HUMALOG) 100 UNIT/ML injection    Sig: Inject 0-0.12 mLs (0-12 Units total) into the skin 3 (three) times daily with meals. Per sliding scale    Dispense:  30 mL    Refill:  3    For blood sugars 0-150 give 0 units of insulin, 151-200 give 2 units of insulin, 201-250 give 4 units, 251-300 give 6 units, 301-350 give 8 units, 351-400 give 10 units,> 400 give 12 units    Follow-up: Return in about 1 month (around 05/10/2022) for CPE/ Preventive Health Exam.       Hoy Register, MD, FAAFP. [facility] [place], [place] [phone]   04/10/2022, 4:49 PM

## 2022-04-10 NOTE — Progress Notes (Signed)
A1c 10.8.

## 2022-04-10 NOTE — Patient Instructions (Signed)

## 2022-04-11 ENCOUNTER — Other Ambulatory Visit: Payer: Self-pay | Admitting: Family

## 2022-04-11 ENCOUNTER — Other Ambulatory Visit: Payer: Self-pay | Admitting: Family Medicine

## 2022-04-11 ENCOUNTER — Other Ambulatory Visit: Payer: Self-pay

## 2022-04-11 DIAGNOSIS — M5431 Sciatica, right side: Secondary | ICD-10-CM

## 2022-04-11 LAB — CMP14+EGFR
ALT: 22 IU/L (ref 0–32)
AST: 20 IU/L (ref 0–40)
Albumin/Globulin Ratio: 1.8 (ref 1.2–2.2)
Albumin: 4.9 g/dL (ref 3.9–4.9)
Alkaline Phosphatase: 101 IU/L (ref 44–121)
BUN/Creatinine Ratio: 6 — ABNORMAL LOW (ref 9–23)
BUN: 5 mg/dL — ABNORMAL LOW (ref 6–20)
Bilirubin Total: 0.3 mg/dL (ref 0.0–1.2)
CO2: 23 mmol/L (ref 20–29)
Calcium: 9.5 mg/dL (ref 8.7–10.2)
Chloride: 98 mmol/L (ref 96–106)
Creatinine, Ser: 0.77 mg/dL (ref 0.57–1.00)
Globulin, Total: 2.7 g/dL (ref 1.5–4.5)
Glucose: 175 mg/dL — ABNORMAL HIGH (ref 70–99)
Potassium: 3.9 mmol/L (ref 3.5–5.2)
Sodium: 138 mmol/L (ref 134–144)
Total Protein: 7.6 g/dL (ref 6.0–8.5)
eGFR: 103 mL/min/{1.73_m2} (ref >59)

## 2022-04-11 LAB — HCV INTERPRETATION

## 2022-04-11 LAB — HCV AB W REFLEX TO QUANT PCR: HCV Ab: NONREACTIVE

## 2022-04-11 LAB — MICROALBUMIN / CREATININE URINE RATIO
Creatinine, Urine: 134.7 mg/dL
Microalb/Creat Ratio: 24 mg/g creat (ref 0–29)
Microalbumin, Urine: 32.9 ug/mL

## 2022-04-12 ENCOUNTER — Telehealth: Payer: Self-pay | Admitting: Family Medicine

## 2022-04-12 ENCOUNTER — Telehealth: Payer: Medicaid Other | Admitting: Physician Assistant

## 2022-04-12 ENCOUNTER — Other Ambulatory Visit: Payer: Self-pay

## 2022-04-12 ENCOUNTER — Telehealth: Payer: Medicaid Other

## 2022-04-12 DIAGNOSIS — Z760 Encounter for issue of repeat prescription: Secondary | ICD-10-CM

## 2022-04-12 DIAGNOSIS — M5431 Sciatica, right side: Secondary | ICD-10-CM

## 2022-04-12 MED ORDER — GABAPENTIN 300 MG PO CAPS
300.0000 mg | ORAL_CAPSULE | Freq: Three times a day (TID) | ORAL | 3 refills | Status: DC | PRN
Start: 2022-04-12 — End: 2022-04-12
  Filled 2022-04-12: qty 90, 30d supply, fill #0
  Filled 2022-04-12: qty 120, 40d supply, fill #0

## 2022-04-12 MED ORDER — GABAPENTIN 300 MG PO CAPS
600.0000 mg | ORAL_CAPSULE | Freq: Three times a day (TID) | ORAL | 2 refills | Status: DC | PRN
Start: 2022-04-12 — End: 2022-07-05
  Filled 2022-04-12 – 2022-04-15 (×3): qty 180, 30d supply, fill #0
  Filled 2022-05-14: qty 180, 30d supply, fill #1
  Filled 2022-06-11: qty 180, 30d supply, fill #2

## 2022-04-12 NOTE — Progress Notes (Signed)
Patient requesting med refill of her Gabapentin. Per chart her PCP refilled this morning with 3 additional refills. Patient made aware. Nothing else needed.

## 2022-04-12 NOTE — Telephone Encounter (Signed)
Increased dose was sent to pharmacy.

## 2022-04-12 NOTE — Telephone Encounter (Signed)
Pt states directions for gabapentin (NEURONTIN) 300 MG capsule are incorrect  Pt states she is suppose to take 6 a day and will run out of the current medication on 9-15  Pt inquiring if additional tablets can be prescribed  Please assist further

## 2022-04-13 ENCOUNTER — Telehealth: Payer: Self-pay

## 2022-04-13 ENCOUNTER — Other Ambulatory Visit: Payer: Self-pay

## 2022-04-13 NOTE — Telephone Encounter (Signed)
Noted  

## 2022-04-13 NOTE — Telephone Encounter (Signed)
The way I read this message is that the patient is stating she is taking 6 daily which is correct.  If she is taking more than 6 the MD would have to send in a new script.  The pharmacists are looking into whether or not this can be filled early as she picked up a 20 days supply at 6 caps daily on 03/29/22.  No increase in dosage was received by the pharmacy.

## 2022-04-13 NOTE — Telephone Encounter (Signed)
Patients DOB verified. Patient informed of providers comments.

## 2022-04-13 NOTE — Telephone Encounter (Signed)
Message sent down to [facility] to clarify.

## 2022-04-13 NOTE — Telephone Encounter (Addendum)
Patient requesting PCP to contact pharmacy and give a "ok" to refill gabapentin (NEURONTIN) 300 MG capsule today due to  the pain she is experiencing from her sciatic nerve.   Patient states she has been taking 1 or 2 more then instructioned due to the scaitic nerve pain and she will run out on 04/14/2022.    Tresa Endo from [facility] stated patient is not due to pick up medication until 04/17/2022 unless PCP approves.

## 2022-04-13 NOTE — Telephone Encounter (Signed)
Patients DOB verified. Results given from provider.

## 2022-04-17 ENCOUNTER — Other Ambulatory Visit: Payer: Self-pay

## 2022-04-24 NOTE — Telephone Encounter (Signed)
LCSWA called patient today and few times before to introduce herself and to assess patients' mental health needs. Patient did not answer the phone. LCSWA was unable to leave a message due to voicemail box being full. Patient was referred by PCP for community resources for therapist.

## 2022-05-03 ENCOUNTER — Ambulatory Visit (HOSPITAL_BASED_OUTPATIENT_CLINIC_OR_DEPARTMENT_OTHER): Payer: Medicaid Other | Admitting: Physical Therapy

## 2022-05-09 ENCOUNTER — Ambulatory Visit (HOSPITAL_BASED_OUTPATIENT_CLINIC_OR_DEPARTMENT_OTHER): Payer: Medicaid Other | Admitting: Physical Therapy

## 2022-05-11 ENCOUNTER — Encounter: Payer: Self-pay | Admitting: Family Medicine

## 2022-05-11 ENCOUNTER — Ambulatory Visit: Payer: Medicaid Other | Attending: Family Medicine | Admitting: Family Medicine

## 2022-05-11 ENCOUNTER — Other Ambulatory Visit (HOSPITAL_COMMUNITY)
Admission: RE | Admit: 2022-05-11 | Discharge: 2022-05-11 | Disposition: A | Discharge status: Home / Self Care | Payer: Medicaid Other | Source: Ambulatory Visit | Attending: Family Medicine | Admitting: Family Medicine

## 2022-05-11 ENCOUNTER — Ambulatory Visit (HOSPITAL_BASED_OUTPATIENT_CLINIC_OR_DEPARTMENT_OTHER): Payer: Medicaid Other | Admitting: Orthopaedic Surgery

## 2022-05-11 ENCOUNTER — Other Ambulatory Visit: Payer: Self-pay

## 2022-05-11 VITALS — BP 115/74 | HR 92 | Temp 98.7°F | Ht 67.0 in | Wt 138.2 lb

## 2022-05-11 DIAGNOSIS — Z124 Encounter for screening for malignant neoplasm of cervix: Secondary | ICD-10-CM

## 2022-05-11 DIAGNOSIS — Z113 Encounter for screening for infections with a predominantly sexual mode of transmission: Secondary | ICD-10-CM | POA: Insufficient documentation

## 2022-05-11 DIAGNOSIS — K089 Disorder of teeth and supporting structures, unspecified: Secondary | ICD-10-CM | POA: Diagnosis not present

## 2022-05-11 DIAGNOSIS — B3731 Acute candidiasis of vulva and vagina: Secondary | ICD-10-CM

## 2022-05-11 DIAGNOSIS — Z0000 Encounter for general adult medical examination without abnormal findings: Secondary | ICD-10-CM | POA: Diagnosis not present

## 2022-05-11 DIAGNOSIS — G8929 Other chronic pain: Secondary | ICD-10-CM

## 2022-05-11 MED ORDER — FLUCONAZOLE 150 MG PO TABS
150.0000 mg | ORAL_TABLET | Freq: Once | ORAL | 0 refills | Status: AC
  Filled 2022-05-11: qty 1, 1d supply, fill #0

## 2022-05-11 NOTE — Patient Instructions (Signed)

## 2022-05-11 NOTE — Progress Notes (Signed)
CPE/PAP °

## 2022-05-11 NOTE — Progress Notes (Signed)
Subjective:  Patient ID: Madison Williams, female    DOB: [birth year]  Age: 36 y.o. MRN: [id number]  CC: Annual Exam and Gynecologic Exam   HPI Madison Williams is a 36 y.o. year old female with a history of  hypertension, type 1 diabetes mellitus (A1c 10.8), rheumatoid arthritis, alcohol abuse, anxiety.  Interval History: Presents today for complete physical exam and is due for breast cancer screening.  She would also like screening for STDs. Denies family history of breast cancer.  She has tooth decay and is concerned she might have periodontal disease.  Requests a dental referral. Past Medical History:  Diagnosis Date   Asthma    Diabetes mellitus without complication ([place])    Rheumatoid arthritis ([place])     Past Surgical History:  Procedure Laterality Date   LAPAROSCOPIC OVARIAN     LEFT HEART CATH AND CORONARY ANGIOGRAPHY N/A 11/24/2019   Procedure: LEFT HEART CATH AND CORONARY ANGIOGRAPHY;  Surgeon: Burnell Blanks, MD;  Location: [facility];  Service: Cardiovascular;  Laterality: N/A;    Family History  Family history unknown: Yes    Social History   Socioeconomic History   Marital status: Single    Spouse name: Not on file   Number of children: 1   Years of education: Not on file   Highest education level: Not on file  Occupational History   Not on file  Tobacco Use   Smoking status: Former    Packs/day: 1.00    Types: Cigarettes   Smokeless tobacco: Never  Vaping Use   Vaping Use: Every day   Substances: Nicotine  Substance and Sexual Activity   Alcohol use: Not Currently   Drug use: Yes    Comment: Kratum   Sexual activity: Not on file  Other Topics Concern   Not on file  Social History Narrative   Not on file   Social Determinants of Health   Financial Resource Strain: Not on file  Food Insecurity: Not on file  Transportation Needs: Not on file  Physical Activity: Not on file  Stress: Not on file  Social  Connections: Not on file    No Known Allergies  Outpatient Medications Prior to Visit  Medication Sig Dispense Refill   clonazePAM (KLONOPIN) 1 MG tablet Take 1 mg by mouth 2 (two) times daily as needed for anxiety.     FLUoxetine (PROZAC) 20 MG capsule Take 20 mg by mouth 2 (two) times daily.     gabapentin (NEURONTIN) 300 MG capsule Take 2 capsules (600 mg total) by mouth 3 (three) times daily as needed. 180 capsule 2   ibuprofen (ADVIL) 200 MG tablet Take 200 mg by mouth every 6 (six) hours as needed for moderate pain.     insulin glargine (LANTUS) 100 UNIT/ML injection Inject 0.1 mLs (10 Units total) into the skin in the morning AND 0.3 mLs (30 Units total) at bedtime. 30 mL 3   insulin lispro (HUMALOG) 100 UNIT/ML injection Inject 0-0.12 mLs (0-12 Units total) into the skin 3 (three) times daily with meals. Per sliding scale 30 mL 3   Insulin Syringe-Needle U-100 (TRUEPLUS INSULIN SYRINGE) 31G X 5/16" 0.5 ML MISC Use to inject insulin daily. 100 each 6   Insulin Syringes, Disposable, U-100 0.5 ML MISC Use 4 times a day as directed 120 each 6   promethazine (PHENERGAN) 25 MG tablet Take 1 tablet (25 mg total) by mouth every 6 (six) hours as needed for nausea or vomiting.  30 tablet 0   cyclobenzaprine (FLEXERIL) 5 MG tablet Take 1-2 tablets (5-10 mg total) by mouth 3 (three) times daily as needed for muscle spasms. (Patient not taking: Reported on 04/10/2022) 30 tablet 1   No facility-administered medications prior to visit.     ROS Review of Systems  Constitutional:  Negative for activity change, appetite change and fatigue.  HENT:  Positive for dental problem. Negative for congestion, sinus pressure and sore throat.   Eyes:  Negative for visual disturbance.  Respiratory:  Negative for cough, chest tightness, shortness of breath and wheezing.   Cardiovascular:  Negative for chest pain and palpitations.  Gastrointestinal:  Negative for abdominal distention, abdominal pain and  constipation.  Endocrine: Negative for polydipsia.  Genitourinary:  Negative for dysuria and frequency.  Musculoskeletal:  Negative for arthralgias and back pain.  Skin:  Negative for rash.  Neurological:  Negative for tremors, light-headedness and numbness.  Hematological:  Does not bruise/bleed easily.  Psychiatric/Behavioral:  Negative for agitation and behavioral problems.     Objective:  BP 115/74   Pulse 92   Temp 98.7 F (37.1 C) (Oral)   Ht 5\' 7"  (1.702 m)   Wt 138 lb 3.2 oz (62.7 kg)   LMP 03/16/2018 Comment: negative urine preg test 01/14/20  SpO2 99%   BMI 21.65 kg/m      05/11/2022   10:22 AM 04/10/2022    4:02 PM 10/13/2021    4:05 PM  BP/Weight  Systolic BP 115 102 115  Diastolic BP 74 70 77  Wt. (Lbs) 138.2 130.8 133  BMI 21.65 kg/m2 20.49 kg/m2 20.83 kg/m2      Physical Exam Exam conducted with a chaperone present.  Constitutional:      General: She is not in acute distress.    Appearance: She is well-developed. She is not diaphoretic.  HENT:     Head: Normocephalic.     Right Ear: External ear normal.     Left Ear: External ear normal.     Nose: Nose normal.     Mouth/Throat:     Comments: Loss of molar teeth Eyes:     Conjunctiva/sclera: Conjunctivae normal.     Pupils: Pupils are equal, round, and reactive to light.  Neck:     Vascular: No JVD.  Cardiovascular:     Rate and Rhythm: Normal rate and regular rhythm.     Heart sounds: Normal heart sounds. No murmur heard.    No gallop.  Pulmonary:     Effort: Pulmonary effort is normal. No respiratory distress.     Breath sounds: Normal breath sounds. No wheezing or rales.  Chest:     Chest wall: No tenderness.  Breasts:    Right: Normal. No mass, nipple discharge or tenderness.     Left: Normal. No mass, nipple discharge or tenderness.  Abdominal:     General: Bowel sounds are normal. There is no distension.     Palpations: Abdomen is soft. There is no mass.     Tenderness: There is no  abdominal tenderness.     Hernia: There is no hernia in the left inguinal area or right inguinal area.  Genitourinary:    General: Normal vulva.     Pubic Area: No rash.      Labia:        Right: No rash.        Left: No rash.      Vagina: Vaginal discharge (whitish) present.     Cervix: Normal.  Uterus: Normal.      Adnexa: Right adnexa normal and left adnexa normal.       Right: No tenderness.         Left: No tenderness.    Musculoskeletal:        General: No tenderness. Normal range of motion.     Cervical back: Normal range of motion. No tenderness.  Lymphadenopathy:     Upper Body:     Right upper body: No supraclavicular or axillary adenopathy.     Left upper body: No supraclavicular or axillary adenopathy.  Skin:    General: Skin is warm and dry.  Neurological:     Mental Status: She is alert and oriented to person, place, and time.     Deep Tendon Reflexes: Reflexes are normal and symmetric.        Latest Ref Rng & Units 04/10/2022    4:45 PM 07/06/2021    3:04 AM 07/05/2021    2:54 AM  CMP  Glucose 70 - 99 mg/dL 960  454  83   BUN 6 - 20 mg/dL 5  21  30    Creatinine 0.57 - 1.00 mg/dL  0.98  1.19   Sodium 134 - 144 mmol/L 138  141  147   Potassium 3.5 - 5.2 mmol/L 3.9  3.8  3.1   Chloride 96 - 106 mmol/L 98  111  114   CO2 20 - 29 mmol/L 23  20  23    Calcium 8.7 - 10.2 mg/dL 9.5  8.0  7.5   Total Protein 6.0 - 8.5 g/dL 7.6  6.2  5.8   Total Bilirubin 0.0 - 1.2 mg/dL 0.3  0.7  0.7   Alkaline Phos 44 - 121 IU/L 101  80  73   AST 0 - 40 IU/L 20  50  78   ALT 0 - 32 IU/L 22  33  35     Lipid Panel     Component Value Date/Time   CHOL 275 (H) 02/05/2020 1152   TRIG 70 02/05/2020 1152   HDL 138 02/05/2020 1152   CHOLHDL 2.0 02/05/2020 1152   CHOLHDL 2.1 11/25/2019 0920   VLDL 16 11/25/2019 0920   LDLCALC 126 (H) 02/05/2020 1152    CBC    Component Value Date/Time   WBC 4.2 07/06/2021 0304   RBC 3.65 (L) 07/06/2021 0304   HGB 11.0 (L)  07/06/2021 0304   HCT 33.4 (L) 07/06/2021 0304   PLT 105 (L) 07/06/2021 0304   MCV 91.5 07/06/2021 0304   MCH 30.1 07/06/2021 0304   MCHC 32.9 07/06/2021 0304   RDW 16.0 (H) 07/06/2021 0304   LYMPHSABS 4.1 (H) 07/03/2021 1025   MONOABS 1.7 (H) 07/03/2021 1025   EOSABS 0.0 07/03/2021 1025   BASOSABS 0.1 07/03/2021 1025    Lab Results  Component Value Date   HGBA1C 10.8 (A) 04/10/2022    Assessment & Plan:  1. Annual physical exam Counseled on 150 minutes of exercise per week, healthy eating (including decreased daily intake of saturated fats, cholesterol, added sugars, sodium), routine healthcare maintenance.   2. Screening for STD (sexually transmitted disease) - Cervicovaginal ancillary only  3. Screening for cervical cancer - Cytology - PAP  4. Chronic dental pain - Ambulatory referral to Dentistry  5. Vaginal candidiasis - fluconazole (DIFLUCAN) 150 MG tablet; Take 1 tablet (150 mg total) by mouth once for 1 dose.  Dispense: 1 tablet; Refill: 0    Meds ordered this encounter  Medications   fluconazole (DIFLUCAN) 150 MG tablet    Sig: Take 1 tablet (150 mg total) by mouth once for 1 dose.    Dispense:  1 tablet    Refill:  0    Follow-up: Return in about 3 months (around 08/11/2022) for Chronic medical conditions.       Hoy Register, MD, FAAFP. [facility] [place], [place] [phone]   05/11/2022, 10:52 AM

## 2022-05-12 LAB — CERVICOVAGINAL ANCILLARY ONLY
Candida Glabrata: NEGATIVE
Candida Vaginitis: NEGATIVE
Chlamydia: NEGATIVE
Comment: NEGATIVE
Comment: NEGATIVE
Comment: NEGATIVE
Comment: NEGATIVE
Comment: NORMAL
Neisseria Gonorrhea: NEGATIVE
Trichomonas: NEGATIVE

## 2022-05-15 ENCOUNTER — Other Ambulatory Visit: Payer: Self-pay

## 2022-05-16 ENCOUNTER — Other Ambulatory Visit: Payer: Self-pay

## 2022-05-16 LAB — CYTOLOGY - PAP
Adequacy: ABSENT
Comment: NEGATIVE
Diagnosis: NEGATIVE
High risk HPV: NEGATIVE

## 2022-05-18 ENCOUNTER — Encounter: Payer: Self-pay | Admitting: Family Medicine

## 2022-05-25 ENCOUNTER — Ambulatory Visit (HOSPITAL_BASED_OUTPATIENT_CLINIC_OR_DEPARTMENT_OTHER): Payer: Medicaid Other | Admitting: Physical Therapy

## 2022-06-12 ENCOUNTER — Other Ambulatory Visit: Payer: Self-pay

## 2022-06-19 NOTE — Therapy (Addendum)
OUTPATIENT PHYSICAL THERAPY THORACOLUMBAR EVALUATION   Patient Name: Madison Williams MRN: [id number] DOB:1986/02/10, 36 y.o., female Today's Date: 06/21/2022  END OF SESSION:  PT End of Session - 06/20/22 1712     Visit Number 1    Number of Visits 12    Date for PT Re-Evaluation 08/01/22    Authorization Type MCD    PT Start Time 1610    PT Stop Time 1703    PT Time Calculation (min) 53 min    Activity Tolerance Patient tolerated treatment well    Behavior During Therapy WFL for tasks assessed/performed             Past Medical History:  Diagnosis Date   Asthma    Diabetes mellitus without complication (HCC)    Rheumatoid arthritis (HCC)    Past Surgical History:  Procedure Laterality Date   LAPAROSCOPIC OVARIAN     LEFT HEART CATH AND CORONARY ANGIOGRAPHY N/A 11/24/2019   Procedure: LEFT HEART CATH AND CORONARY ANGIOGRAPHY;  Surgeon: Kathleene Hazel, MD;  Location: [facility];  Service: Cardiovascular;  Laterality: N/A;   Patient Active Problem List   Diagnosis Date Noted   Hyperkalemia 07/03/2021   SIRS (systemic inflammatory response syndrome) (HCC) 07/03/2021   Hypermagnesemia 07/03/2021   Hyperphosphatemia 07/03/2021   Septic shock (HCC)    Rheumatoid arthritis (HCC)    Substance withdrawal (HCC) 01/14/2020   SOB (shortness of breath)    Hypokalemia    Macrocytosis without anemia    Retroperitoneal hematoma    DKA (diabetic ketoacidosis) (HCC) 11/24/2019   Acute ST elevation myocardial infarction (STEMI) of anterolateral wall (HCC)    Abnormal EKG    Elevated troponin    Nausea & vomiting 10/16/2019   Abdominal pain 10/16/2019   Chest tightness    Mild intermittent asthma without complication    Acute vomiting 01/31/2019   Type 1 diabetes mellitus with hyperglycemia (HCC) 01/31/2019   Uninsured 01/31/2019   DKA (diabetic ketoacidoses) 03/30/2018   Acute renal failure (HCC) 03/30/2018   Leukocytosis 03/30/2018   Dehydration  03/30/2018   Tobacco use disorder 03/30/2018    PCP: Hoy Register, MD  REFERRING PROVIDER: Hoy Register, MD   REFERRING DIAG:  M54.31 (ICD-10-CM) - Sciatica of right side    Rationale for Evaluation and Treatment: Rehabilitation  THERAPY DIAG:  Sciatica, right side  Other low back pain  Muscle weakness (generalized)  Joint stiffness in lumbar  ONSET DATE: Exacerbation in March 2023  SUBJECTIVE:  SUBJECTIVE STATEMENT:  Pt states she has had intermittent back, hip, and leg pain since she was a teenager.  Pt reports her pain has worsened as she has gotten older.  Pt reports she had an exacerbation of pain and sx's in March.  She thinks it could be due to her increased sitting at work.  Pt denies any specific traumatic injury.  Pt saw MD on 04/10/22 and MD ordered PT.  Pt received a back handout including exercises, appropriate positions, and posture from MD.  Pt states he has been doing some of the stretches.  Pt reports the stretches help temporarily.    Pt has increased pain with sitting and performing work activities.  "It's mainly on my right side".  Pt reports having tingling in low back and and R hip.  She has numbness in R posterior thigh from buttock to R post knee.  Pt reports decreased stamina.  She is limited with staying in the same position.  Pt reports she is limited with reaching.  Pt has increased pain with folding clothes, unloading dishwasher, and doing laundry.  Pt is limited with playing with her daughter occasionally.   Pt reports Flexeril helps occasionally and gabapentin helps consistently.    PERTINENT HISTORY:  -R > L knee pain -Type 1 DM, Anxiety and depression, diabetic ketoacidosis, and STEMI -Pt states she has had RA since she was 21-20 yrs old, though she was  told by MD she doesn't have RA due to recent bloodwork  PAIN:  NPRS:  5/10 current, 8-8.5/10 worst, 2.5-3/10 best Worst pain in prolonged positions, working multiple shifts without rest, and overexertion Location:  R>L sided lumbar, R sided thoracic, medial scap. Numbness in R post thigh Easing factors:  gabapentin, changing positions, heating pad/warm bath Aggravating factors:  prolonged positions, cold weather, excessive ambulation   PRECAUTIONS: Pt states she was informed she doesn't have RA, though was dx'd in the past with RA.  WEIGHT BEARING RESTRICTIONS: No  FALLS:  Has patient fallen in last 6 months? Yes. 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  Primarily sitting and performing computer work  PLOF: Independent; Pt has a hx of pain though was able to perform her daily activities and work activities with improved ease and less pain.   PATIENT GOALS: increase knowledge of management of sx's.  improve strength, stamina, and pain    OBJECTIVE:   DIAGNOSTIC FINDINGS:  None   PATIENT SURVEYS:  Modified Oswestry 54%     COGNITION: Overall cognitive status: Within functional limits for tasks assessed     SENSATION: Assess next visit   PALPATION: Pt has soft tissue tightness in bilat lumbar paraspinals.  She is tender to palpate bilat lumbar paraspinals and mid thoracic paraspinals and medial scap mm.    LUMBAR ROM:   AROM eval  Flexion WNL "feels good"  Extension WNL with pain  Right lateral flexion 80%  Left lateral flexion WFL   Right rotation 75%  Left rotation 75%   (Blank rows = not tested)    LOWER EXTREMITY MMT:    MMT Right eval Left eval  Hip flexion 4/5 5/5  Hip extension    Hip abduction    Hip adduction    Hip internal rotation    Hip external rotation 3+/5 4+/5  Knee flexion    Knee extension 5/5 4+/5  Ankle dorsiflexion 4+/5 and shaky 5/5  Ankle plantarflexion  Weak  and shaky WFL in sitting  Ankle inversion    Ankle eversion     (Blank rows = not tested)  LUMBAR SPECIAL TESTS:  Slump Test: R: positive, L:  negative Supine SLR test: R:  positive, L:  negative    GAIT: Assistive device utilized: None Level of assistance: Complete Independence Comments: decreased reciprocal arm swing and pelvic rotation  TODAY'S TREATMENT:                                                                                                                                  PATIENT EDUCATION:  Education details: Reviewed some of MD program which was given to pt.  PT instructed pt in performing seated nn flossing including correct form.  POC, dx, objective findings, and relevant anatomy.  Person educated: Patient Education method: Explanation, Demonstration, and Verbal cues Education comprehension: verbalized understanding, verbal cues required, and needs further education  HOME EXERCISE PROGRAM: Pt has a program from MD.  ASSESSMENT:  CLINICAL IMPRESSION: Patient is a 35 y.o. female with a dx of R sided sciatica presenting to the clinic with lumbar pain, R LE numbness, thoracic pain, limited lumbar ROM, and muscle weakness.  Pt reports having a hx of back, leg, and hip pain which exacerbated in March 2023.  Pt c/o's of R sided > L sided lumbar pain and numbness in R posterior thigh from buttock to R post knee.  Pt has increased pain in prolonged positions including sitting to perform work activities.  She is limited with reaching and has increased pain with performing household chores.  Pt is limited with playing with her daughter occasionally.  Pt has positive neural tension tests on R side.  Pt should benefit from skilled PT services to address impairments and to improve overall function.    OBJECTIVE IMPAIRMENTS: Abnormal gait, decreased activity tolerance, decreased endurance, decreased mobility, difficulty walking, decreased ROM, decreased strength,  hypomobility, increased fascial restrictions, impaired flexibility, and pain.   ACTIVITY LIMITATIONS: bending, sitting, standing, locomotion level, and reaching  PARTICIPATION LIMITATIONS: cleaning, laundry, and occupation  PERSONAL FACTORS: Time since onset of injury/illness/exacerbation and 3+ comorbidities: bilat knee pain, Type 1 DM, Anxiety, and depression  are also affecting patient's functional outcome.   REHAB POTENTIAL: Good  CLINICAL DECISION MAKING: Evolving/moderate complexity  EVALUATION COMPLEXITY: Moderate   GOALS:   SHORT TERM GOALS: Target date: 07/11/22   Pt will be independent and compliant with HEP for improved pain, ROM, strength, and function.  Baseline: Goal status: INITIAL  2.  Pt will demo improved lumbar AROM to be [facility] in R Sb'ing and bilat rotation for improved mobility and tightness.  Baseline:  Goal status: INITIAL  3.  Pt will report at least a 25% improvement in pain and sx's overall for improved mobility.  Baseline:  Goal status: INITIAL   LONG TERM GOALS: Target date: 08/01/22  Pt will be able to play with her daughter without limitation and  without significant pain.  Baseline:  Goal status: INITIAL  2.  Pt will report at least a 70% improvement in pain and R LE sx's overall.  Baseline:  Goal status: INITIAL  3.  Pt will be able to perform her normal household chores including laundry and loading/unloading dishwasher without significant pain and difficulty.  Baseline:  Goal status: INITIAL  4.  Pt will demo improved core strength as evidenced by progression of core exercises without adverse effects for improved tolerance of and performance of functional mobility and playing with her daughter.  Baseline:  Goal status: INITIAL  5.  Pt will be able to perform her normal work activities without significant pain.  Baseline:  Goal status: INITIAL    PLAN:  PT FREQUENCY: 2x/week  PT DURATION: 6 weeks  PLANNED INTERVENTIONS:  Therapeutic exercises, Therapeutic activity, Neuromuscular re-education, Gait training, Patient/Family education, Self Care, Stair training, Aquatic Therapy, Dry Needling, Electrical stimulation, Cryotherapy, Moist heat, Taping, Ultrasound, Manual therapy, and Re-evaluation.  PLAN FOR NEXT SESSION: Assess sensation to LT next visit.  Neural flossing techniques.  HS stretching.  Core strengthening.  Perform lumbar rotation.  Soft tissue mobilization.   Check all possible CPT codes: 46962 - PT Re-evaluation, 97110- Therapeutic Exercise, [phone]- Neuro Re-education, [phone] - Gait Training, [phone] - Manual Therapy, 97530 - Therapeutic Activities, [phone] - Self Care, [phone] - Electrical stimulation (unattended), [id number] - Ultrasound, [id number] - Physical performance training, and [phone] - Aquatic therapy    Check all conditions that are expected to impact treatment: Diabetes mellitus   If treatment provided at initial evaluation, no treatment charged due to lack of authorization.       Audie Clear III PT, DPT 06/21/22 9:49 PM  PHYSICAL THERAPY DISCHARGE SUMMARY  Visits from Start of Care: 1  Current functional level related to goals / functional outcomes: Unable to assess due to pt not being present at discharge.   Remaining deficits: Unable to assess due to pt not being present at discharge.    Education / Equipment:    Patient was seen for an initial evaluation on 06/20/2022 and did not return to therapy.  She cancelled multiple appointments with the majority of cancellations due to car trouble/transportation issues.  Pt will be considered discharged from skilled PT at this time.   Audie Clear III PT, DPT 02/28/23 9:23 AM

## 2022-06-20 ENCOUNTER — Ambulatory Visit (HOSPITAL_BASED_OUTPATIENT_CLINIC_OR_DEPARTMENT_OTHER): Payer: Medicaid Other | Attending: Family Medicine | Admitting: Physical Therapy

## 2022-06-20 ENCOUNTER — Other Ambulatory Visit: Payer: Self-pay

## 2022-06-20 DIAGNOSIS — M5459 Other low back pain: Secondary | ICD-10-CM | POA: Insufficient documentation

## 2022-06-20 DIAGNOSIS — M6281 Muscle weakness (generalized): Secondary | ICD-10-CM | POA: Insufficient documentation

## 2022-06-20 DIAGNOSIS — M5431 Sciatica, right side: Secondary | ICD-10-CM | POA: Diagnosis present

## 2022-06-21 ENCOUNTER — Encounter (HOSPITAL_BASED_OUTPATIENT_CLINIC_OR_DEPARTMENT_OTHER): Payer: Self-pay | Admitting: Physical Therapy

## 2022-07-04 ENCOUNTER — Ambulatory Visit (HOSPITAL_BASED_OUTPATIENT_CLINIC_OR_DEPARTMENT_OTHER): Payer: Medicaid Other | Admitting: Physical Therapy

## 2022-07-04 ENCOUNTER — Encounter (HOSPITAL_BASED_OUTPATIENT_CLINIC_OR_DEPARTMENT_OTHER): Payer: Self-pay

## 2022-07-05 ENCOUNTER — Other Ambulatory Visit: Payer: Self-pay | Admitting: Family Medicine

## 2022-07-05 ENCOUNTER — Other Ambulatory Visit: Payer: Self-pay

## 2022-07-05 DIAGNOSIS — M5431 Sciatica, right side: Secondary | ICD-10-CM

## 2022-07-05 MED ORDER — GABAPENTIN 300 MG PO CAPS
600.0000 mg | ORAL_CAPSULE | Freq: Three times a day (TID) | ORAL | 2 refills | Status: DC | PRN
  Filled 2022-07-05: qty 180, 30d supply, fill #0
  Filled 2022-08-02: qty 180, 30d supply, fill #1
  Filled 2022-08-29: qty 180, 30d supply, fill #2

## 2022-07-06 ENCOUNTER — Other Ambulatory Visit: Payer: Self-pay

## 2022-07-07 ENCOUNTER — Other Ambulatory Visit: Payer: Self-pay

## 2022-07-11 ENCOUNTER — Other Ambulatory Visit: Payer: Self-pay

## 2022-07-11 ENCOUNTER — Encounter (HOSPITAL_BASED_OUTPATIENT_CLINIC_OR_DEPARTMENT_OTHER): Payer: Medicaid Other | Admitting: Physical Therapy

## 2022-07-11 ENCOUNTER — Encounter (HOSPITAL_BASED_OUTPATIENT_CLINIC_OR_DEPARTMENT_OTHER): Payer: Self-pay

## 2022-07-12 ENCOUNTER — Other Ambulatory Visit: Payer: Self-pay

## 2022-07-18 ENCOUNTER — Encounter (HOSPITAL_BASED_OUTPATIENT_CLINIC_OR_DEPARTMENT_OTHER): Payer: Medicaid Other | Admitting: Physical Therapy

## 2022-07-20 ENCOUNTER — Ambulatory Visit (HOSPITAL_BASED_OUTPATIENT_CLINIC_OR_DEPARTMENT_OTHER): Payer: Medicaid Other | Admitting: Physical Therapy

## 2022-07-25 ENCOUNTER — Encounter (HOSPITAL_BASED_OUTPATIENT_CLINIC_OR_DEPARTMENT_OTHER): Payer: Medicaid Other | Admitting: Physical Therapy

## 2022-08-01 ENCOUNTER — Encounter (HOSPITAL_BASED_OUTPATIENT_CLINIC_OR_DEPARTMENT_OTHER): Payer: Medicaid Other | Admitting: Physical Therapy

## 2022-08-02 ENCOUNTER — Other Ambulatory Visit: Payer: Self-pay

## 2022-08-03 ENCOUNTER — Encounter (HOSPITAL_BASED_OUTPATIENT_CLINIC_OR_DEPARTMENT_OTHER): Payer: Medicaid Other | Admitting: Physical Therapy

## 2022-08-04 ENCOUNTER — Other Ambulatory Visit: Payer: Self-pay

## 2022-08-08 ENCOUNTER — Encounter (HOSPITAL_BASED_OUTPATIENT_CLINIC_OR_DEPARTMENT_OTHER): Payer: Medicaid Other | Admitting: Physical Therapy

## 2022-08-10 ENCOUNTER — Ambulatory Visit (HOSPITAL_BASED_OUTPATIENT_CLINIC_OR_DEPARTMENT_OTHER): Payer: Medicaid Other | Admitting: Physical Therapy

## 2022-08-10 ENCOUNTER — Encounter (HOSPITAL_BASED_OUTPATIENT_CLINIC_OR_DEPARTMENT_OTHER): Payer: Self-pay

## 2022-08-12 ENCOUNTER — Telehealth: Payer: Medicaid Other | Admitting: Family Medicine

## 2022-08-12 DIAGNOSIS — A084 Viral intestinal infection, unspecified: Secondary | ICD-10-CM | POA: Diagnosis not present

## 2022-08-12 MED ORDER — ONDANSETRON HCL 4 MG PO TABS: 4.0000 mg | ORAL_TABLET | Freq: Three times a day (TID) | ORAL | 0 refills | Status: DC | PRN

## 2022-08-12 NOTE — Patient Instructions (Signed)

## 2022-08-12 NOTE — Progress Notes (Signed)
Virtual Visit Consent   Madison Williams, you are scheduled for a virtual visit with a [facility] provider today. Just as with appointments in the office, your consent must be obtained to participate. Your consent will be active for this visit and any virtual visit you may have with one of our providers in the next 365 days. If you have a MyChart account, a copy of this consent can be sent to you electronically.  As this is a virtual visit, video technology does not allow for your provider to perform a traditional examination. This may limit your provider's ability to fully assess your condition. If your provider identifies any concerns that need to be evaluated in person or the need to arrange testing (such as labs, EKG, etc.), we will make arrangements to do so. Although advances in technology are sophisticated, we cannot ensure that it will always work on either your end or our end. If the connection with a video visit is poor, the visit may have to be switched to a telephone visit. With either a video or telephone visit, we are not always able to ensure that we have a secure connection.  By engaging in this virtual visit, you consent to the provision of healthcare and authorize for your insurance to be billed (if applicable) for the services provided during this visit. Depending on your insurance coverage, you may receive a charge related to this service.  I need to obtain your verbal consent now. Are you willing to proceed with your visit today? Madison Williams has provided verbal consent on 08/12/2022 for a virtual visit (video or telephone). Madison Nims, FNP  Date: 08/12/2022 7:41 AM  Virtual Visit via Video Note   I, Madison Williams, connected with  Madison Williams  ([id number], 11/20/1952) on 08/12/22 at  8:15 AM EST by a video-enabled telemedicine application and verified that I am speaking with the correct person using two identifiers.  Location: Patient: Virtual Visit Location  Patient: Home Provider: Virtual Visit Location Provider: Home Office   I discussed the limitations of evaluation and management by telemedicine and the availability of in person appointments. The patient expressed understanding and agreed to proceed.    History of Present Illness: Madison Williams is a 37 y.o. who identifies as a female who was assigned female at birth, and is being seen today for nausea vomiting and diarrhea, started at 3 am, No fever, chills body aches. Her daughter has had a stomach virus for 2 days and she has it now. She is able to keep down sips of water. Marland Kitchen  HPI: HPI  Problems:  Patient Active Problem List   Diagnosis Date Noted   Hyperkalemia 07/03/2021   SIRS (systemic inflammatory response syndrome) ([place]) 07/03/2021   Hypermagnesemia 07/03/2021   Hyperphosphatemia 07/03/2021   Septic shock (HCC)    Rheumatoid arthritis ([place])    Substance withdrawal ([place]) 01/14/2020   SOB (shortness of breath)    Hypokalemia    Macrocytosis without anemia    Retroperitoneal hematoma    DKA (diabetic ketoacidosis) ([place]) 11/24/2019   Acute ST elevation myocardial infarction (STEMI) of anterolateral wall (HCC)    Abnormal EKG    Elevated troponin    Nausea & vomiting 10/16/2019   Abdominal pain 10/16/2019   Chest tightness    Mild intermittent asthma without complication    Acute vomiting 01/31/2019   Type 1 diabetes mellitus with hyperglycemia ([place]) 01/31/2019   Uninsured 01/31/2019   DKA (diabetic ketoacidoses) 03/30/2018  Acute renal failure ([place]) 03/30/2018   Leukocytosis 03/30/2018   Dehydration 03/30/2018   Tobacco use disorder 03/30/2018    Allergies: No Known Allergies Medications:  Current Outpatient Medications:    ondansetron (ZOFRAN) 4 MG tablet, Take 1 tablet (4 mg total) by mouth every 8 (eight) hours as needed for nausea or vomiting., Disp: 20 tablet, Rfl: 0   clonazePAM (KLONOPIN) 1 MG tablet, Take 1 mg by mouth 2 (two) times daily as needed for  anxiety., Disp: , Rfl:    cyclobenzaprine (FLEXERIL) 5 MG tablet, Take 1-2 tablets (5-10 mg total) by mouth 3 (three) times daily as needed for muscle spasms. (Patient not taking: Reported on 04/10/2022), Disp: 30 tablet, Rfl: 1   FLUoxetine (PROZAC) 20 MG capsule, Take 20 mg by mouth 2 (two) times daily., Disp: , Rfl:    gabapentin (NEURONTIN) 300 MG capsule, Take 2 capsules (600 mg total) by mouth 3 (three) times daily as needed., Disp: 180 capsule, Rfl: 2   ibuprofen (ADVIL) 200 MG tablet, Take 200 mg by mouth every 6 (six) hours as needed for moderate pain., Disp: , Rfl:    insulin glargine (LANTUS) 100 UNIT/ML injection, Inject 0.1 mLs (10 Units total) into the skin in the morning AND 0.3 mLs (30 Units total) at bedtime., Disp: 30 mL, Rfl: 3   insulin lispro (HUMALOG) 100 UNIT/ML injection, Inject 0-0.12 mLs (0-12 Units total) into the skin 3 (three) times daily with meals. Per sliding scale, Disp: 30 mL, Rfl: 3   Insulin Syringe-Needle U-100 (TRUEPLUS INSULIN SYRINGE) 31G X 5/16" 0.5 ML MISC, Use to inject insulin daily., Disp: 100 each, Rfl: 6   promethazine (PHENERGAN) 25 MG tablet, Take 1 tablet (25 mg total) by mouth every 6 (six) hours as needed for nausea or vomiting., Disp: 30 tablet, Rfl: 0  Observations/Objective: Patient is well-developed, well-nourished in no acute distress.  Resting comfortably  at home.  Head is normocephalic, atraumatic.  No labored breathing.  Speech is clear and coherent with logical content.  Patient is alert and oriented at baseline.    Assessment and Plan: 1. Viral gastroenteritis  Fluids as tolerated, no milk or dairy, progress diet as tolerated, urgent care if sx persist or worsen.   Follow Up Instructions: I discussed the assessment and treatment plan with the patient. The patient was provided an opportunity to ask questions and all were answered. The patient agreed with the plan and demonstrated an understanding of the instructions.  A copy of  instructions were sent to the patient via MyChart unless otherwise noted below.     The patient was advised to call back or seek an in-person evaluation if the symptoms worsen or if the condition fails to improve as anticipated.  Time:  I spent 10 minutes with the patient via telehealth technology discussing the above problems/concerns.    Madison Nims, FNP

## 2022-08-15 ENCOUNTER — Telehealth: Payer: Self-pay

## 2022-08-15 ENCOUNTER — Ambulatory Visit (HOSPITAL_BASED_OUTPATIENT_CLINIC_OR_DEPARTMENT_OTHER): Payer: Medicaid Other | Admitting: Physical Therapy

## 2022-08-15 ENCOUNTER — Encounter (HOSPITAL_BASED_OUTPATIENT_CLINIC_OR_DEPARTMENT_OTHER): Payer: Self-pay

## 2022-08-15 ENCOUNTER — Other Ambulatory Visit: Payer: Self-pay

## 2022-08-15 ENCOUNTER — Telehealth: Payer: Self-pay | Admitting: Family Medicine

## 2022-08-15 ENCOUNTER — Ambulatory Visit: Payer: Medicaid Other | Attending: Family Medicine | Admitting: Family Medicine

## 2022-08-15 ENCOUNTER — Encounter: Payer: Self-pay | Admitting: Family Medicine

## 2022-08-15 VITALS — BP 109/73 | HR 96 | Ht 67.0 in | Wt 137.8 lb

## 2022-08-15 DIAGNOSIS — Z91119 Patient's noncompliance with dietary regimen due to unspecified reason: Secondary | ICD-10-CM | POA: Diagnosis not present

## 2022-08-15 DIAGNOSIS — K047 Periapical abscess without sinus: Secondary | ICD-10-CM | POA: Diagnosis not present

## 2022-08-15 DIAGNOSIS — F419 Anxiety disorder, unspecified: Secondary | ICD-10-CM | POA: Insufficient documentation

## 2022-08-15 DIAGNOSIS — E1065 Type 1 diabetes mellitus with hyperglycemia: Secondary | ICD-10-CM | POA: Insufficient documentation

## 2022-08-15 DIAGNOSIS — Z91148 Patient's other noncompliance with medication regimen for other reason: Secondary | ICD-10-CM | POA: Insufficient documentation

## 2022-08-15 DIAGNOSIS — F32A Depression, unspecified: Secondary | ICD-10-CM

## 2022-08-15 DIAGNOSIS — I10 Essential (primary) hypertension: Secondary | ICD-10-CM | POA: Insufficient documentation

## 2022-08-15 DIAGNOSIS — M069 Rheumatoid arthritis, unspecified: Secondary | ICD-10-CM | POA: Diagnosis not present

## 2022-08-15 DIAGNOSIS — Z794 Long term (current) use of insulin: Secondary | ICD-10-CM | POA: Diagnosis not present

## 2022-08-15 LAB — POCT GLYCOSYLATED HEMOGLOBIN (HGB A1C): HbA1c, POC (controlled diabetic range): 11.5 % — AB (ref 0.0–7.0)

## 2022-08-15 LAB — GLUCOSE, POCT (MANUAL RESULT ENTRY): POC Glucose: 88 mg/dl (ref 70–99)

## 2022-08-15 MED ORDER — AMOXICILLIN 500 MG PO CAPS
500.0000 mg | ORAL_CAPSULE | Freq: Three times a day (TID) | ORAL | 0 refills | Status: AC
  Filled 2022-08-15 – 2022-08-23 (×2): qty 30, 10d supply, fill #0

## 2022-08-15 MED ORDER — FREESTYLE LIBRE 2 READER DEVI
0 refills | Status: DC
  Filled 2022-08-15 – 2022-08-24 (×2): qty 1, 30d supply, fill #0

## 2022-08-15 MED ORDER — FREESTYLE LIBRE 2 SENSOR MISC
3 refills | Status: DC
  Filled 2022-08-15 – 2022-08-24 (×2): qty 2, 28d supply, fill #0
  Filled 2022-09-20: qty 2, 28d supply, fill #1
  Filled 2022-10-22: qty 2, 28d supply, fill #2
  Filled 2022-11-20: qty 2, 28d supply, fill #3

## 2022-08-15 NOTE — Patient Instructions (Signed)
Dental Abscess  A dental abscess is an infection around a tooth that may involve pain, swelling, and a collection of pus, as well as other symptoms. Treatment is important to help with symptoms and to prevent the infection from spreading. The general types of dental abscesses are: Pulpal abscess. This abscess may form from the inner part of the tooth (pulp). Periodontal abscess. This abscess may form from the gum. What are the causes? This condition is caused by a bacterial infection in or around the tooth. It may result from: Severe tooth decay (cavities). Trauma to the tooth, such as a broken or chipped tooth. What increases the risk? This condition is more likely to develop in males. It is also more likely to develop in people who: Have cavities. Have severe gum disease. Eat sugary snacks between meals. Use tobacco products. Have diabetes. Have a weakened disease-fighting system (immune system). Do not brush and care for their teeth regularly. What are the signs or symptoms? Mild symptoms of this condition include: Tenderness. Bad breath. Fever. A bitter taste in the mouth. Pain in and around the infected tooth. Moderate symptoms of this condition include: Swollen neck glands. Chills. Pus drainage. Swelling and redness around the infected tooth, in the mouth, or in the face. Severe pain in and around the infected tooth. Severe symptoms of this condition include: Difficulty swallowing. Difficulty opening the mouth. Nausea. Vomiting. How is this diagnosed? This condition is diagnosed based on: Your symptoms and your medical and dental history. An examination of the infected tooth. During the exam, your dental care provider may tap on the infected tooth. You may also need to have X-rays taken of the affected area. How is this treated? This condition is treated by getting rid of the infection. This may be done with: Antibiotic medicines. These may be used in certain  situations. Antibacterial mouth rinse. Incision and drainage. This procedure is done by making an incision in the abscess to drain out the pus. Removing pus is the first priority in treating an abscess. A root canal. This may be performed to save the tooth. Your dental care provider accesses the visible part of your tooth (crown) with a drill and removes any infected pulp. Then the space is filled and sealed off. Tooth extraction. The tooth is pulled out if it cannot be saved by other treatment. You may also receive treatment for pain, such as: Acetaminophen or NSAIDs. Gels that contain a numbing medicine. An injection to block the pain near your nerve. Follow these instructions at home: Medicines Take over-the-counter and prescription medicines only as told by your dental care provider. If you were prescribed an antibiotic, take it as told by your dental care provider. Do not stop taking the antibiotic even if you start to feel better. If you were prescribed a gel that contains a numbing medicine, use it exactly as told in the directions. Do not use these gels for children who are younger than 2 years of age. Use an antibacterial mouth rinse as told by your dental care provider. General instructions  Gargle with a mixture of salt and water 3-4 times a day or as needed. To make salt water, completely dissolve -1 tsp (3-6 g) of salt in 1 cup (237 mL) of warm water. Eat a soft diet while your abscess is healing. Drink enough fluid to keep your urine pale yellow. Do not apply heat to the outside of your mouth. Do not use any products that contain nicotine or tobacco. These   products include cigarettes, chewing tobacco, and vaping devices, such as e-cigarettes. If you need help quitting, ask your dental care provider. Keep all follow-up visits. This is important. How is this prevented?  Excellent dental home care, which includes brushing your teeth every morning and night with fluoride  toothpaste. Floss one time each day. Get regularly scheduled dental cleanings. Consider having a dental sealant applied on teeth that have deep grooves to prevent cavities. Drink fluoridated water regularly. This includes most tap water. Check the label on bottled water to see if it contains fluoride. Reduce or eliminate sugary drinks. Eat healthy meals and snacks. Wear a mouth guard or face shield to protect your teeth while playing sports. Contact a health care provider if: Your pain is worse and is not helped by medicine. You have swelling. You see pus around the tooth. You have a fever or chills. Get help right away if: Your symptoms suddenly get worse. You have a very bad headache. You have problems breathing or swallowing. You have trouble opening your mouth. You have swelling in your neck or around your eye. These symptoms may represent a serious problem that is an emergency. Do not wait to see if the symptoms will go away. Get medical help right away. Call your local emergency services (911 in the [place] Do not drive yourself to the hospital. Summary A dental abscess is a collection of pus in or around a tooth that results from an infection. A dental abscess may result from severe tooth decay, trauma to the tooth, or severe gum disease around a tooth. Symptoms include severe pain, swelling, redness, and drainage of pus in and around the infected tooth. The first priority in treating a dental abscess is to drain out the pus. Treatment may also involve removing damage inside the tooth (root canal) or extracting the tooth. This information is not intended to replace advice given to you by your health care provider. Make sure you discuss any questions you have with your health care provider. Document Revised: 09/23/2020 Document Reviewed: 09/23/2020 Elsevier Patient Education  2023 [employer]  

## 2022-08-15 NOTE — Progress Notes (Signed)
Subjective:  Patient ID: Madison Williams, female    DOB: 1985/11/24  Age: 37 y.o. MRN: [id number]  CC: Dental Problem   HPI Madison Williams is a 37 y.o. year old female with a history of hypertension, type 1 diabetes mellitus diagnosed in 2017 (A1c 11.5), rheumatoid arthritis, alcohol abuse, anxiety.   Interval History:  Her back molar is broken off and she thinks she has an abscess, her teeth are broken off and she thinks she needs dentures.She has brushed and flossed regularly and cannot understand why her teeth are in poor condition.  A1c is 11.5 up from 10.5. She sometimes forgets to take her medications and skips on some days. She has also been non adherent with a diabetic diet.  Blood sugar fluctuates whenever she checks it sometimes she has lows and sometimes she has highs above 300.  This morning she had to take in applesauce because her blood sugar was low, 80s 88 in the clinic. She needs   Her Psychiatrist is at the [facility]- Dr Evelene Croon and she has a therapist whom she had a visit with last week.  She has had to be on a payment plan with them and this has been difficult to afford.  She feels like living is hard and she can't catch a break, misses work due to this,  she denies suicidal ideation. Past Medical History:  Diagnosis Date   Asthma    Diabetes mellitus without complication (HCC)    Rheumatoid arthritis (HCC)     Past Surgical History:  Procedure Laterality Date   LAPAROSCOPIC OVARIAN     LEFT HEART CATH AND CORONARY ANGIOGRAPHY N/A 11/24/2019   Procedure: LEFT HEART CATH AND CORONARY ANGIOGRAPHY;  Surgeon: Kathleene Hazel, MD;  Location: [facility];  Service: Cardiovascular;  Laterality: N/A;    Family History  Family history unknown: Yes    Social History   Socioeconomic History   Marital status: Single    Spouse name: Not on file   Number of children: 1   Years of education: Not on file   Highest education level: Not on  file  Occupational History   Not on file  Tobacco Use   Smoking status: Former    Packs/day: 1.00    Types: Cigarettes   Smokeless tobacco: Never  Vaping Use   Vaping Use: Every day   Substances: Nicotine  Substance and Sexual Activity   Alcohol use: Not Currently   Drug use: Yes    Comment: [place]   Sexual activity: Not on file  Other Topics Concern   Not on file  Social History Narrative   Not on file   Social Determinants of Health   Financial Resource Strain: Not on file  Food Insecurity: Not on file  Transportation Needs: Not on file  Physical Activity: Not on file  Stress: Not on file  Social Connections: Not on file    No Known Allergies  Outpatient Medications Prior to Visit  Medication Sig Dispense Refill   clonazePAM (KLONOPIN) 1 MG tablet Take 1 mg by mouth 2 (two) times daily as needed for anxiety.     cyclobenzaprine (FLEXERIL) 5 MG tablet Take 1-2 tablets (5-10 mg total) by mouth 3 (three) times daily as needed for muscle spasms. 30 tablet 1   FLUoxetine (PROZAC) 20 MG capsule Take 20 mg by mouth 2 (two) times daily.     gabapentin (NEURONTIN) 300 MG capsule Take 2 capsules (600 mg total) by mouth  3 (three) times daily as needed. 180 capsule 2   ibuprofen (ADVIL) 200 MG tablet Take 200 mg by mouth every 6 (six) hours as needed for moderate pain.     insulin glargine (LANTUS) 100 UNIT/ML injection Inject 0.1 mLs (10 Units total) into the skin in the morning AND 0.3 mLs (30 Units total) at bedtime. 30 mL 3   insulin lispro (HUMALOG) 100 UNIT/ML injection Inject 0-0.12 mLs (0-12 Units total) into the skin 3 (three) times daily with meals. Per sliding scale 30 mL 3   Insulin Syringe-Needle U-100 (TRUEPLUS INSULIN SYRINGE) 31G X 5/16" 0.5 ML MISC Use to inject insulin daily. 100 each 6   ondansetron (ZOFRAN) 4 MG tablet Take 1 tablet (4 mg total) by mouth every 8 (eight) hours as needed for nausea or vomiting. 20 tablet 0   promethazine (PHENERGAN) 25 MG tablet  Take 1 tablet (25 mg total) by mouth every 6 (six) hours as needed for nausea or vomiting. 30 tablet 0   No facility-administered medications prior to visit.     ROS Review of Systems  Constitutional:  Negative for activity change and appetite change.  HENT:  Positive for dental problem. Negative for sinus pressure and sore throat.   Respiratory:  Negative for chest tightness, shortness of breath and wheezing.   Cardiovascular:  Negative for chest pain and palpitations.  Gastrointestinal:  Negative for abdominal distention, abdominal pain and constipation.  Genitourinary: Negative.   Musculoskeletal: Negative.   Psychiatric/Behavioral:  Positive for dysphoric mood. Negative for behavioral problems.     Objective:  BP 109/73   Pulse 96   Ht 5\' 7"  (1.702 m)   Wt 137 lb 12.8 oz (62.5 kg)   LMP 03/16/2018 Comment: negative urine preg test 01/14/20  SpO2 99%   BMI 21.58 kg/m      08/15/2022    9:27 AM 05/11/2022   10:22 AM 04/10/2022    4:02 PM  BP/Weight  Systolic BP 109 115 102  Diastolic BP 73 74 70  Wt. (Lbs) 137.8 138.2 130.8  BMI 21.58 kg/m2 21.65 kg/m2 20.49 kg/m2      Physical Exam Constitutional:      Appearance: She is well-developed.  HENT:     Mouth/Throat:     Comments: Poor dentition Cardiovascular:     Rate and Rhythm: Normal rate.     Heart sounds: Normal heart sounds. No murmur heard. Pulmonary:     Effort: Pulmonary effort is normal.     Breath sounds: Normal breath sounds. No wheezing or rales.  Chest:     Chest wall: No tenderness.  Abdominal:     General: Bowel sounds are normal. There is no distension.     Palpations: Abdomen is soft. There is no mass.     Tenderness: There is no abdominal tenderness.  Musculoskeletal:        General: Normal range of motion.     Right lower leg: No edema.     Left lower leg: No edema.  Neurological:     Mental Status: She is alert and oriented to person, place, and time.  Psychiatric:     Comments:  Dysphoric mood        Latest Ref Rng & Units 04/10/2022    4:45 PM 07/06/2021    3:04 AM 07/05/2021    2:54 AM  CMP  Glucose 70 - 99 mg/dL 14/12/2020  846  83   BUN 6 - 20 mg/dL 5  21  30  Creatinine 0.57 - 1.00 mg/dL 0.77  0.96  0.98   Sodium 134 - 144 mmol/L 138  141  147   Potassium 3.5 - 5.2 mmol/L 3.9  3.8  3.1   Chloride 96 - 106 mmol/L 98  111  114   CO2 20 - 29 mmol/L 23  20  23    Calcium 8.7 - 10.2 mg/dL 9.5  8.0  7.5   Total Protein 6.0 - 8.5 g/dL 7.6  6.2  5.8   Total Bilirubin 0.0 - 1.2 mg/dL 0.3  0.7  0.7   Alkaline Phos 44 - 121 IU/L 101  80  73   AST 0 - 40 IU/L 20  50  78   ALT 0 - 32 IU/L 22  33  35     Lipid Panel     Component Value Date/Time   CHOL 275 (H) 02/05/2020 1152   TRIG 70 02/05/2020 1152   HDL 138 02/05/2020 1152   CHOLHDL 2.0 02/05/2020 1152   CHOLHDL 2.1 11/25/2019 0920   VLDL 16 11/25/2019 0920   LDLCALC 126 (H) 02/05/2020 1152    CBC    Component Value Date/Time   WBC 4.2 07/06/2021 0304   RBC 3.65 (L) 07/06/2021 0304   HGB 11.0 (L) 07/06/2021 0304   HCT 33.4 (L) 07/06/2021 0304   PLT 105 (L) 07/06/2021 0304   MCV 91.5 07/06/2021 0304   MCH 30.1 07/06/2021 0304   MCHC 32.9 07/06/2021 0304   RDW 16.0 (H) 07/06/2021 0304   LYMPHSABS 4.1 (H) 07/03/2021 1025   MONOABS 1.7 (H) 07/03/2021 1025   EOSABS 0.0 07/03/2021 1025   BASOSABS 0.1 07/03/2021 1025    Lab Results  Component Value Date   HGBA1C 11.5 (A) 08/15/2022       08/15/2022    9:29 AM 05/11/2022   10:24 AM 04/10/2022    4:07 PM 10/13/2021    4:12 PM 04/29/2021   11:32 AM  Depression screen PHQ 2/9  Decreased Interest 3 2 1 1  0  Down, Depressed, Hopeless 2 2 1 1  0  PHQ - 2 Score 5 4 2 2  0  Altered sleeping 2 3 1 2  0  Tired, decreased energy 2 3 2 2  0  Change in appetite 2 1 1  0 0  Feeling bad or failure about yourself  3 2 1 1  0  Trouble concentrating 3 2 2 1  0  Moving slowly or fidgety/restless 2 1 0 0 0  Suicidal thoughts 2 1 0 0 0  PHQ-9 Score 21 17 9 8  0   Difficult doing work/chores     Not difficult at all       08/15/2022    9:29 AM 05/11/2022   10:24 AM 04/10/2022    4:07 PM 10/13/2021    4:12 PM  GAD 7 : Generalized Anxiety Score  Nervous, Anxious, on Edge 3 3 3 2   Control/stop worrying 3 3 3 2   Worry too much - different things 3 3 3 2   Trouble relaxing 3 3 3 3   Restless 3 1 0 1  Easily annoyed or irritable 2 2 1 3   Afraid - awful might happen 3 1 2 2   Total GAD 7 Score 20 16 15 15      Assessment & Plan:  1. Type 1 diabetes mellitus with hyperglycemia (HCC) Uncontrolled with A1c of 11.5 due to medication and dietary nonadherence Underlying depression playing a role Goal A1c is less than 7.0 She will benefit from having a  CGM and insulin pump Will order CGM Strongly encouraged to adhere to medication regimen Will refer to endocrinology - POCT glucose (manual entry) - POCT glycosylated hemoglobin (Hb A1C) - Microalbumin / creatinine urine ratio - LP+Non-HDL Cholesterol - CMP14+EGFR - Ambulatory referral to Endocrinology  2. Dental abscess - amoxicillin (AMOXIL) 500 MG capsule; Take 1 capsule (500 mg total) by mouth 3 (three) times daily for 10 days.  Dispense: 30 capsule; Refill: 0 - Ambulatory referral to Dentistry  3. Nonadherence to medication Her depression is playing a role We have discussed support systems for her   4. Anxiety and depression Uncontrolled, exacerbated by psychosocial stressors and underlying uncontrolled medical condition She denies suicidal ideations or intents Currently sees a therapist and a psychiatrist Will have LCSW reach out to her I had the case manager see her today to discuss resources available   Meds ordered this encounter  Medications   amoxicillin (AMOXIL) 500 MG capsule    Sig: Take 1 capsule (500 mg total) by mouth 3 (three) times daily for 10 days.    Dispense:  30 capsule    Refill:  0    Follow-up: Return in about 3 months (around 11/14/2022) for Chronic medical  conditions.       Charlott Rakes, MD, FAAFP. [facility], [place]   08/15/2022, 1:00 PM

## 2022-08-15 NOTE — Telephone Encounter (Signed)
Yes ma'am, rx sent.

## 2022-08-15 NOTE — Progress Notes (Signed)
Scheduled flu appointment for up coming Monday per patient request

## 2022-08-15 NOTE — Addendum Note (Signed)
Addended by: Daisy Blossom, Annie Main L on: 08/15/2022 03:27 PM   Modules accepted: Orders

## 2022-08-15 NOTE — Telephone Encounter (Signed)
At the request of Dr Margarita Rana, I met with the patient when she was in the clinic this morning.    She first stated that she needs to see a dentist and is interested in dentures.  She has Medicaid and has started to make calls to dental offices but has not found a practice that offers dentures. I  provided her with a list of dental resources and told her to call me back if she needs additional information.  She then explained that she would like to go to an inpatient mental health facility to help her address her extreme anxiety and depression. I asked her if she has discussed this need with her therapist at the [facility].  She said no, and then explained that  that she was behind with her payments at the Center so she was not seen for awhile.  She said her father recently helped  her pay the balance. She did not say how much she owed by stated that visits with the psychiatrist are $100 and visits with the therapist are $50 per her payment plan.  She said that the Center did not accept Medicaid. She is not opposed to going to another practice that accepts Medicaid. She really likes her therapist but is open to changing psychiatrist and/or therapist.   I was not aware that [facility] did not accept Medicaid.   She would like to discuss her options for therapy , medication management as well as inpatient mental health treatment with our clinical social worker.   She [place] works for a [employer] [place] and likes her job but is very worried that she will loose her job because she has been calling out sick about twice a month.  She plans to take [facility] for her inpatient care. She said it is essential that she receive behavioral health support, nothing has been easy for her and she has faced a lot of challenges in her life. She currently is supposed to be living with her 36 yo daughter and grandmother however, she explained that her grandmother has a problem with alcohol use so she and  her daughter have been staying with her mother.    She is anxious to speak with our clinic SW to discuss her options for treatment.

## 2022-08-15 NOTE — Telephone Encounter (Signed)
LCSWA called patient today to introduce herself and to assess patients' mental health needs. Patient did not answer the phone. LCSWA was able to leave a brief message with the patient asking them to return the call. Patient was referred by PCP for resources and referrals for inpatient therapy.

## 2022-08-15 NOTE — Telephone Encounter (Signed)
Can you please assist with CGM that is covered by her insurance company, thank you.

## 2022-08-16 ENCOUNTER — Other Ambulatory Visit: Payer: Self-pay | Admitting: Family Medicine

## 2022-08-16 ENCOUNTER — Other Ambulatory Visit: Payer: Self-pay

## 2022-08-16 DIAGNOSIS — E1169 Type 2 diabetes mellitus with other specified complication: Secondary | ICD-10-CM

## 2022-08-16 LAB — CMP14+EGFR
ALT: 23 IU/L (ref 0–32)
AST: 21 IU/L (ref 0–40)
Albumin/Globulin Ratio: 1.7 (ref 1.2–2.2)
Albumin: 4.6 g/dL (ref 3.9–4.9)
Alkaline Phosphatase: 97 IU/L (ref 44–121)
BUN/Creatinine Ratio: 11 (ref 9–23)
BUN: 8 mg/dL (ref 6–20)
Bilirubin Total: <0.2 mg/dL (ref 0.0–1.2)
CO2: 28 mmol/L (ref 20–29)
Calcium: 9.4 mg/dL (ref 8.7–10.2)
Chloride: 94 mmol/L — ABNORMAL LOW (ref 96–106)
Creatinine, Ser: 0.71 mg/dL (ref 0.57–1.00)
Globulin, Total: 2.7 g/dL (ref 1.5–4.5)
Glucose: 92 mg/dL (ref 70–99)
Potassium: 4.2 mmol/L (ref 3.5–5.2)
Sodium: 138 mmol/L (ref 134–144)
Total Protein: 7.3 g/dL (ref 6.0–8.5)
eGFR: 114 mL/min/{1.73_m2} (ref >59)

## 2022-08-16 LAB — LP+NON-HDL CHOLESTEROL
Cholesterol, Total: 278 mg/dL — ABNORMAL HIGH (ref 100–199)
HDL: 131 mg/dL (ref >39)
LDL Chol Calc (NIH): 129 mg/dL — ABNORMAL HIGH (ref 0–99)
Total Non-HDL-Chol (LDL+VLDL): 147 mg/dL — ABNORMAL HIGH (ref 0–129)
Triglycerides: 113 mg/dL (ref 0–149)
VLDL Cholesterol Cal: 18 mg/dL (ref 5–40)

## 2022-08-16 LAB — MICROALBUMIN / CREATININE URINE RATIO
Creatinine, Urine: 148.9 mg/dL
Microalb/Creat Ratio: 12 mg/g creat (ref 0–29)
Microalbumin, Urine: 17.6 ug/mL

## 2022-08-16 MED ORDER — ATORVASTATIN CALCIUM 20 MG PO TABS
20.0000 mg | ORAL_TABLET | Freq: Every day | ORAL | 3 refills | Status: DC
  Filled 2022-08-16: qty 30, 30d supply, fill #0

## 2022-08-17 ENCOUNTER — Encounter (HOSPITAL_BASED_OUTPATIENT_CLINIC_OR_DEPARTMENT_OTHER): Payer: Self-pay

## 2022-08-17 ENCOUNTER — Encounter (HOSPITAL_BASED_OUTPATIENT_CLINIC_OR_DEPARTMENT_OTHER): Payer: Medicaid Other | Admitting: Physical Therapy

## 2022-08-17 ENCOUNTER — Other Ambulatory Visit: Payer: Self-pay

## 2022-08-18 NOTE — Telephone Encounter (Signed)
Can you please send her a MyChart message?  She might have been at work when you called  Thank you

## 2022-08-21 ENCOUNTER — Ambulatory Visit: Payer: Medicaid Other

## 2022-08-21 NOTE — Telephone Encounter (Signed)
Sent patient a mychart message.

## 2022-08-22 ENCOUNTER — Other Ambulatory Visit: Payer: Self-pay

## 2022-08-23 ENCOUNTER — Other Ambulatory Visit: Payer: Self-pay

## 2022-08-23 ENCOUNTER — Telehealth: Payer: Self-pay | Admitting: Emergency Medicine

## 2022-08-23 NOTE — Telephone Encounter (Signed)
Copied from [place] [id number]. Topic: Referral - Question >> Aug 23, 2022 12:42 PM Erskine Squibb wrote: Reason for CRM: The patient called in looking for [facility] stating she has been trying to get a hold of the patient. The patient is needing assistance with therapy options that her insurance will cover. Please assist patient further

## 2022-08-24 ENCOUNTER — Other Ambulatory Visit: Payer: Self-pay

## 2022-08-25 ENCOUNTER — Telehealth: Payer: Medicaid Other

## 2022-08-25 ENCOUNTER — Telehealth: Payer: Medicaid Other | Admitting: Family Medicine

## 2022-08-25 DIAGNOSIS — A084 Viral intestinal infection, unspecified: Secondary | ICD-10-CM

## 2022-08-25 NOTE — Progress Notes (Signed)
Virtual Visit Consent   Madison Williams, you are scheduled for a virtual visit with a [facility] provider today. Just as with appointments in the office, your consent must be obtained to participate. Your consent will be active for this visit and any virtual visit you may have with one of our providers in the next 365 days. If you have a MyChart account, a copy of this consent can be sent to you electronically.  As this is a virtual visit, video technology does not allow for your provider to perform a traditional examination. This may limit your provider's ability to fully assess your condition. If your provider identifies any concerns that need to be evaluated in person or the need to arrange testing (such as labs, EKG, etc.), we will make arrangements to do so. Although advances in technology are sophisticated, we cannot ensure that it will always work on either your end or our end. If the connection with a video visit is poor, the visit may have to be switched to a telephone visit. With either a video or telephone visit, we are not always able to ensure that we have a secure connection.  By engaging in this virtual visit, you consent to the provision of healthcare and authorize for your insurance to be billed (if applicable) for the services provided during this visit. Depending on your insurance coverage, you may receive a charge related to this service.  I need to obtain your verbal consent now. Are you willing to proceed with your visit today? Madison Williams has provided verbal consent on 08/25/2022 for a virtual visit (video or telephone). Madison Curio, FNP  Date: 08/25/2022 2:04 PM  Virtual Visit via Video Note   I, Madison Williams, connected with  Madison Williams  ([id number], [birth year]) on 08/25/22 at  2:00 PM EST by a video-enabled telemedicine application and verified that I am speaking with the correct person using two identifiers.  Location: Patient: Virtual Visit Location  Patient: Home Provider: Virtual Visit Location Provider: Office/Clinic   I discussed the limitations of evaluation and management by telemedicine and the availability of in person appointments. The patient expressed understanding and agreed to proceed.    History of Present Illness: Madison Williams is a 37 y.o. who identifies as a female who was assigned female at birth, and is being seen today for nausea and vomiting. She says she caught a sotmach bug from her daughter a couple of weeks ago then this week something has gone around at her office and she got sick again yesterday. She says it is improving. She has been able to keep down 2 crackers and sprite. She needs a work note.  Marland Kitchen  HPI: HPI  Problems:  Patient Active Problem List   Diagnosis Date Noted   Hyperlipidemia associated with type 2 diabetes mellitus (HCC) 08/16/2022   Hyperkalemia 07/03/2021   SIRS (systemic inflammatory response syndrome) (HCC) 07/03/2021   Hypermagnesemia 07/03/2021   Hyperphosphatemia 07/03/2021   Septic shock (HCC)    Rheumatoid arthritis (HCC)    Substance withdrawal (HCC) 01/14/2020   SOB (shortness of breath)    Hypokalemia    Macrocytosis without anemia    Retroperitoneal hematoma    DKA (diabetic ketoacidosis) (HCC) 11/24/2019   Acute ST elevation myocardial infarction (STEMI) of anterolateral wall (HCC)    Abnormal EKG    Elevated troponin    Nausea & vomiting 10/16/2019   Abdominal pain 10/16/2019   Chest tightness    Mild intermittent asthma  without complication    Acute vomiting 01/31/2019   Type 1 diabetes mellitus with hyperglycemia ([place]) 01/31/2019   Uninsured 01/31/2019   DKA (diabetic ketoacidoses) 03/30/2018   Acute renal failure ([place]) 03/30/2018   Leukocytosis 03/30/2018   Dehydration 03/30/2018   Tobacco use disorder 03/30/2018    Allergies: No Known Allergies Medications:  Current Outpatient Medications:    amoxicillin (AMOXIL) 500 MG capsule, Take 1 capsule (500 mg  total) by mouth 3 (three) times daily for 10 days., Disp: 30 capsule, Rfl: 0   atorvastatin (LIPITOR) 20 MG tablet, Take 1 tablet (20 mg total) by mouth daily., Disp: 30 tablet, Rfl: 3   clonazePAM (KLONOPIN) 1 MG tablet, Take 1 mg by mouth 2 (two) times daily as needed for anxiety., Disp: , Rfl:    Continuous Blood Gluc Receiver (FREESTYLE LIBRE 2 READER) DEVI, Use to check blood sugar three times daily., Disp: 1 each, Rfl: 0   Continuous Blood Gluc Sensor (FREESTYLE LIBRE 2 SENSOR) MISC, Use to check blood sugar three times daily., Disp: 2 each, Rfl: 3   cyclobenzaprine (FLEXERIL) 5 MG tablet, Take 1-2 tablets (5-10 mg total) by mouth 3 (three) times daily as needed for muscle spasms., Disp: 30 tablet, Rfl: 1   FLUoxetine (PROZAC) 20 MG capsule, Take 20 mg by mouth 2 (two) times daily., Disp: , Rfl:    gabapentin (NEURONTIN) 300 MG capsule, Take 2 capsules (600 mg total) by mouth 3 (three) times daily as needed., Disp: 180 capsule, Rfl: 2   ibuprofen (ADVIL) 200 MG tablet, Take 200 mg by mouth every 6 (six) hours as needed for moderate pain., Disp: , Rfl:    insulin glargine (LANTUS) 100 UNIT/ML injection, Inject 0.1 mLs (10 Units total) into the skin in the morning AND 0.3 mLs (30 Units total) at bedtime., Disp: 30 mL, Rfl: 3   insulin lispro (HUMALOG) 100 UNIT/ML injection, Inject 0-0.12 mLs (0-12 Units total) into the skin 3 (three) times daily with meals. Per sliding scale, Disp: 30 mL, Rfl: 3   Insulin Syringe-Needle U-100 (TRUEPLUS INSULIN SYRINGE) 31G X 5/16" 0.5 ML MISC, Use to inject insulin daily., Disp: 100 each, Rfl: 6   ondansetron (ZOFRAN) 4 MG tablet, Take 1 tablet (4 mg total) by mouth every 8 (eight) hours as needed for nausea or vomiting., Disp: 20 tablet, Rfl: 0   promethazine (PHENERGAN) 25 MG tablet, Take 1 tablet (25 mg total) by mouth every 6 (six) hours as needed for nausea or vomiting., Disp: 30 tablet, Rfl: 0  Observations/Objective: Patient is well-developed,  well-nourished in no acute distress.  Resting comfortably  at home.  Head is normocephalic, atraumatic.  No labored breathing. Speech is clear and coherent with logical content.  Patient is alert and oriented at baseline.    Assessment and Plan: 1. Viral gastroenteritis  Increase fluids, no mik or dairy, urgent care if sx worsen.   Follow Up Instructions: I discussed the assessment and treatment plan with the patient. The patient was provided an opportunity to ask questions and all were answered. The patient agreed with the plan and demonstrated an understanding of the instructions.  A copy of instructions were sent to the patient via MyChart unless otherwise noted below.   Patient has requested to receive PHI (AVS, Work Notes, etc) pertaining to this video visit through e-mail as they are currently without active [facility]. They have voiced understand that email is not considered secure and their health information could be viewed by someone other than the patient.  The patient was advised to call back or seek an in-person evaluation if the symptoms worsen or if the condition fails to improve as anticipated.  Time:  I spent 8 minutes with the patient via telehealth technology discussing the above problems/concerns.    Madison Nims, FNP

## 2022-08-25 NOTE — Patient Instructions (Signed)

## 2022-08-28 ENCOUNTER — Telehealth: Payer: Self-pay | Admitting: Licensed Clinical Social Worker

## 2022-08-28 NOTE — Telephone Encounter (Signed)
Received we keep missing each other

## 2022-08-28 NOTE — Telephone Encounter (Signed)
Called pt today we continue to miss each other. Pt. Needs resources for services that accept her insurance.  Counseling Resources   [email or web address]  [facility] [address], [place], [place] [address] [phone] or [phone] Walk-in urgent care 24/7 for anyone  For [facility] ONLY New patient assessments and therapy walk-ins: Monday and Wednesday 8am-11am First and second Friday 1pm-5pm New patient psychiatry and medication management walk-ins: Mondays, Wednesdays, Thursdays, Fridays 8am-11am No psychiatry walk-ins on Tuesday   *Accepts all insurance and uninsured for Urgent Care needs *Accepts Medicaid and uninsured for outpatient treatment   [facility] (Therapy and psychiatry) [facility] (near [place]) [address], [place], [place] [address] [phone] Fax: [phone] ([place])   [facility] [place],  [place]  [address] [phone] Call for appointment  [facility] (Therapy only)  [facility] [address], [place], [place] [address] Monday - Friday: 8:30 a.m.-12 p.m. / 1 p.m.-2:30 p.m.  [facility] [address] [place], [place] [address] Monday-Friday: 8:30 a.m.-12 p.m. / 2-3:30 p.m. (INSURANCE REQUIRED -MEDICAID ACCEPTED) They do offer a sliding fee scale $20-$30/session   [facility] [place], [place] [address] Phone: [place] [address] [place] [address]  Phone: [phone] (Does not accept Medicaid) (only one provider accepts Medicare)  [facility] [address]at [place], [place] [phone] (Accepts Medicaid and Medicare)  [facility]  [facility]) [place] [address]  [place], [place] [address]  Phone: [phone]  [address] [place], [place] [address] Phone: [phone] [facility] Medicaid) [facility] (Therapy only)  [address] [place], [place] [address] Phone: [phone]   [facility] (Therapy only)  [place], [place] [address] Phone: [phone] [facility])   [place] [address] [place], [place] [address] Phone: [phone] ([place]) [employer] [phone] [address], [place] [address] Phone: [phone] [facility]) [facility] (Psychiatry only)  [phone] [address], [place], [place] [address] (Accepts Medicaid and Medicare) [facility] (Psychiatry and therapy)  [place], [place], [place] [address] [phone] [facility] Medicare) [place] (psychiatry and therapy) [address], [place], [place] [address] [phone]  [facility] at [address], [place], [place], [place] [address] [phone] Accept [address], [place], [place], [place], [place],  and the following types of Medicaid; Alliance, [place], Partners, [place], [place], [employer], Healthy [place], [place] Complete, and [place], as well as offering a Manufacturing systems engineer and private payment options. Provides In-Office Appointments, Virtual Appointments, and Phone Consultations Offers medication management for ages 37 years old and up, including,  Medication Management for Suboxone and [place] [phone] [address], [place], [place] [address] ([place] Medicaid and Medicare)         19.  [facility] (therapy only)  [address] [place], [place] [address]            [phone] (Accepts medicare) 20. [facility]  (Suboxone and methodone) [phone] [address] [place], [place] [address] To Be Eligible for Opioid  Treatment at [facility] you must be at least 36 years of age you have already tried other interventions that were not successful such as opioid detox, inpatient rehab for opioids, or outpatient counseling specifically for opioid dependency your [facility] drug test must be completely free of benzodiazepines (klonopin, xanax, valium, ativan, or other benz) you have reliable transportation to the [facility] clinic in [place] you recognize that counseling is a critical component of [facility]' Opioid Program and you agree to attend all required counseling sessions you are committed to total drug abstinence and will conscientiously strive to remain free of alcohol, marijuana, and other illicit substances while in treatment you desire a peaceful treatment atmosphere in which personal responsibility and respect toward staff and clients is the norm   21. [facility] [place], [place], [place] [address] Offers SAIOP (Substance Abuse Intensive Outpatient Program) [phone] 22. [facility] [address] [place], [place] [address] [phone] (Accepts medicare)  For those who are tech savvy, go on psychology today, type in your local city (i.e. [place]. [place]) and specify your insurance at the top of the screen after you search. (Medicaid if needed). You can also specify whether you are interested in therapy and psychiatry.  [email or web address]

## 2022-08-29 ENCOUNTER — Other Ambulatory Visit: Payer: Self-pay

## 2022-09-08 ENCOUNTER — Telehealth: Payer: Medicaid Other

## 2022-09-26 ENCOUNTER — Other Ambulatory Visit: Payer: Self-pay | Admitting: Family Medicine

## 2022-09-26 ENCOUNTER — Other Ambulatory Visit: Payer: Self-pay

## 2022-09-26 DIAGNOSIS — M5431 Sciatica, right side: Secondary | ICD-10-CM

## 2022-09-27 ENCOUNTER — Other Ambulatory Visit: Payer: Self-pay

## 2022-09-27 MED ORDER — GABAPENTIN 300 MG PO CAPS
600.0000 mg | ORAL_CAPSULE | Freq: Three times a day (TID) | ORAL | 2 refills | Status: DC | PRN
  Filled 2022-09-27: qty 180, 30d supply, fill #0
  Filled 2022-10-25: qty 180, 30d supply, fill #1
  Filled 2022-11-20: qty 180, 30d supply, fill #2

## 2022-09-27 NOTE — Telephone Encounter (Signed)
Requested Prescriptions  Pending Prescriptions Disp Refills   gabapentin (NEURONTIN) 300 MG capsule 180 capsule 2    Sig: Take 2 capsules (600 mg total) by mouth 3 (three) times daily as needed.     Neurology: Anticonvulsants - gabapentin Passed - 09/26/2022  1:06 PM      Passed - Cr in normal range and within 360 days    Creatinine, Ser  Date Value Ref Range Status  08/15/2022 0.71 0.57 - 1.00 mg/dL Final         Passed - Completed PHQ-2 or PHQ-9 in the last 360 days      Passed - Valid encounter within last 12 months    Recent Outpatient Visits           1 month ago Type 1 diabetes mellitus with hyperglycemia ([place])   [place] Charlott Rakes, MD   4 months ago Annual physical exam   [place], Charlane Ferretti, MD   5 months ago Type 1 diabetes mellitus with hyperglycemia [facility])   [place] Charlott Rakes, MD   9 months ago Viral upper respiratory tract infection   [place], Enobong, MD   9 months ago Sciatica, right side   [place], Vernia Buff, NP       Future Appointments             In 1 month Charlott Rakes, MD [place]   In 8 months Shamleffer, Melanie Crazier, MD [facility]

## 2022-10-03 ENCOUNTER — Other Ambulatory Visit: Payer: Self-pay

## 2022-10-24 ENCOUNTER — Other Ambulatory Visit: Payer: Self-pay

## 2022-10-24 ENCOUNTER — Other Ambulatory Visit: Payer: Self-pay | Admitting: Family Medicine

## 2022-10-24 MED ORDER — "INSULIN SYRINGE-NEEDLE U-100 31G X 5/16"" 0.5 ML MISC"
2 refills | Status: DC
  Filled 2022-10-24: qty 100, 25d supply, fill #0
  Filled 2023-01-07 – 2023-01-15 (×2): qty 100, 25d supply, fill #1
  Filled 2023-02-06: qty 100, 25d supply, fill #2
  Filled 2023-02-06: qty 100, 20d supply, fill #2

## 2022-10-25 ENCOUNTER — Other Ambulatory Visit: Payer: Self-pay

## 2022-11-01 ENCOUNTER — Telehealth: Payer: Medicaid Other

## 2022-11-01 ENCOUNTER — Telehealth: Payer: Medicaid Other | Admitting: Nurse Practitioner

## 2022-11-01 NOTE — Progress Notes (Signed)
No show for Video Visit  

## 2022-11-14 ENCOUNTER — Ambulatory Visit: Payer: Medicaid Other | Admitting: Family Medicine

## 2022-11-21 ENCOUNTER — Encounter: Payer: Self-pay | Admitting: Family Medicine

## 2022-11-21 ENCOUNTER — Ambulatory Visit: Payer: Medicaid Other | Attending: Family Medicine | Admitting: Family Medicine

## 2022-11-21 ENCOUNTER — Other Ambulatory Visit: Payer: Self-pay

## 2022-11-21 VITALS — BP 102/69 | HR 83 | Ht 67.0 in | Wt 135.4 lb

## 2022-11-21 DIAGNOSIS — E1065 Type 1 diabetes mellitus with hyperglycemia: Secondary | ICD-10-CM | POA: Insufficient documentation

## 2022-11-21 DIAGNOSIS — G8929 Other chronic pain: Secondary | ICD-10-CM | POA: Insufficient documentation

## 2022-11-21 DIAGNOSIS — F32A Depression, unspecified: Secondary | ICD-10-CM | POA: Diagnosis not present

## 2022-11-21 DIAGNOSIS — M5431 Sciatica, right side: Secondary | ICD-10-CM | POA: Insufficient documentation

## 2022-11-21 DIAGNOSIS — F1010 Alcohol abuse, uncomplicated: Secondary | ICD-10-CM | POA: Insufficient documentation

## 2022-11-21 DIAGNOSIS — M069 Rheumatoid arthritis, unspecified: Secondary | ICD-10-CM | POA: Diagnosis not present

## 2022-11-21 DIAGNOSIS — Z79899 Other long term (current) drug therapy: Secondary | ICD-10-CM | POA: Insufficient documentation

## 2022-11-21 DIAGNOSIS — Z794 Long term (current) use of insulin: Secondary | ICD-10-CM | POA: Insufficient documentation

## 2022-11-21 DIAGNOSIS — M25561 Pain in right knee: Secondary | ICD-10-CM | POA: Diagnosis not present

## 2022-11-21 DIAGNOSIS — I10 Essential (primary) hypertension: Secondary | ICD-10-CM | POA: Diagnosis not present

## 2022-11-21 DIAGNOSIS — M7989 Other specified soft tissue disorders: Secondary | ICD-10-CM | POA: Diagnosis not present

## 2022-11-21 DIAGNOSIS — F419 Anxiety disorder, unspecified: Secondary | ICD-10-CM | POA: Insufficient documentation

## 2022-11-21 LAB — POCT GLYCOSYLATED HEMOGLOBIN (HGB A1C): HbA1c, POC (controlled diabetic range): 10.5 % — AB (ref 0.0–7.0)

## 2022-11-21 LAB — GLUCOSE, POCT (MANUAL RESULT ENTRY): POC Glucose: 173 mg/dl — AB (ref 70–99)

## 2022-11-21 MED ORDER — GABAPENTIN 300 MG PO CAPS
600.0000 mg | ORAL_CAPSULE | Freq: Three times a day (TID) | ORAL | 2 refills | Status: DC | PRN
Start: 2022-11-21 — End: 2023-02-26
  Filled 2022-11-21 – 2022-12-21 (×2): qty 180, 30d supply, fill #0
  Filled 2023-01-15: qty 180, 30d supply, fill #1
  Filled 2023-02-06 (×2): qty 180, 30d supply, fill #2

## 2022-11-21 MED ORDER — NAPROXEN 500 MG PO TABS
500.0000 mg | ORAL_TABLET | Freq: Two times a day (BID) | ORAL | 1 refills | Status: DC
  Filled 2022-11-21: qty 60, 30d supply, fill #0
  Filled 2023-04-25 (×2): qty 60, 30d supply, fill #1

## 2022-11-21 NOTE — Progress Notes (Signed)
Subjective:  Patient ID: Madison Williams, female    DOB: [birth year]  Age: 37 y.o. MRN: [id number]  CC: Diabetes   HPI Madison Williams is a 37 y.o. year old female with a history of hypertension, type 1 diabetes mellitus diagnosed in 2017 (A1c 11.5), rheumatoid arthritis, alcohol abuse, anxiety.   Interval History:  For the last couple of months her right knee has been painful and swollen and this would last up to 5 days  and she has to use a knee brace. Denies preceding trauma. Swelling would occur in superior region and it was noticeably larger than the left and she would elevate it and use ice. She denies presence of fever. Sometimes her knuckles swell at the end of the day.  At the moment she has no knee symptoms.  A1c is 10.5 down from 11.5. Fasting sugar was 140 and it gets up to 350, average. She does have a CGM - Freestyle  Data reviewed : hypoglycemia around 6 am 50, 60 on two days. Hyperglycemia up to 350 occurring various times of the day.  She is not adherent with diabetic diet.  Has been to see nutritionist in the past and is well aware of a diabetic diet. Her appointment with Endocrinology comes up in November 2024-that was the first appointment she could get.  She is yet to make an appointment with Psych as her car broke down 4 months ago. She states she feels better psychologically and anxiety and depression stable.  I had referred her to the LCSW who tried to reach her on several occasions but missed her but had sent her behavioral health resources.   Past Medical History:  Diagnosis Date   Asthma    Diabetes mellitus without complication    Rheumatoid arthritis     Past Surgical History:  Procedure Laterality Date   LAPAROSCOPIC OVARIAN     LEFT HEART CATH AND CORONARY ANGIOGRAPHY N/A 11/24/2019   Procedure: LEFT HEART CATH AND CORONARY ANGIOGRAPHY;  Surgeon: Kathleene Hazel, MD;  Location: [facility];  Service: Cardiovascular;  Laterality:  N/A;    Family History  Family history unknown: Yes    Social History   Socioeconomic History   Marital status: Single    Spouse name: Not on file   Number of children: 1   Years of education: Not on file   Highest education level: Not on file  Occupational History   Not on file  Tobacco Use   Smoking status: Former    Packs/day: 1    Types: Cigarettes   Smokeless tobacco: Never  Vaping Use   Vaping Use: Every day   Substances: Nicotine  Substance and Sexual Activity   Alcohol use: Not Currently   Drug use: Yes    Comment: [place]   Sexual activity: Not on file  Other Topics Concern   Not on file  Social History Narrative   Not on file   Social Determinants of Health   Financial Resource Strain: Not on file  Food Insecurity: Not on file  Transportation Needs: Not on file  Physical Activity: Not on file  Stress: Not on file  Social Connections: Not on file    No Known Allergies  Outpatient Medications Prior to Visit  Medication Sig Dispense Refill   clonazePAM (KLONOPIN) 1 MG tablet Take 1 mg by mouth 2 (two) times daily as needed for anxiety.     Continuous Blood Gluc Receiver (FREESTYLE LIBRE 2 READER) DEVI Use  to check blood sugar three times daily. 1 each 0   Continuous Blood Gluc Sensor (FREESTYLE LIBRE 2 SENSOR) MISC Use to check blood sugar three times daily. 2 each 3   cyclobenzaprine (FLEXERIL) 5 MG tablet Take 1-2 tablets (5-10 mg total) by mouth 3 (three) times daily as needed for muscle spasms. 30 tablet 1   FLUoxetine (PROZAC) 20 MG capsule Take 20 mg by mouth 2 (two) times daily.     ibuprofen (ADVIL) 200 MG tablet Take 200 mg by mouth every 6 (six) hours as needed for moderate pain.     insulin glargine (LANTUS) 100 UNIT/ML injection Inject 0.1 mLs (10 Units total) into the skin in the morning AND 0.3 mLs (30 Units total) at bedtime. 30 mL 3   insulin lispro (HUMALOG) 100 UNIT/ML injection Inject 0-0.12 mLs (0-12 Units total) into the skin 3  (three) times daily with meals. Per sliding scale 30 mL 3   Insulin Syringe-Needle U-100 (TRUEPLUS INSULIN SYRINGE) 31G X 5/16" 0.5 ML MISC Use to inject insulin daily. 100 each 2   gabapentin (NEURONTIN) 300 MG capsule Take 2 capsules (600 mg total) by mouth 3 (three) times daily as needed. 180 capsule 2   atorvastatin (LIPITOR) 20 MG tablet Take 1 tablet (20 mg total) by mouth daily. (Patient not taking: Reported on 11/21/2022) 30 tablet 3   ondansetron (ZOFRAN) 4 MG tablet Take 1 tablet (4 mg total) by mouth every 8 (eight) hours as needed for nausea or vomiting. (Patient not taking: Reported on 11/21/2022) 20 tablet 0   promethazine (PHENERGAN) 25 MG tablet Take 1 tablet (25 mg total) by mouth every 6 (six) hours as needed for nausea or vomiting. (Patient not taking: Reported on 11/21/2022) 30 tablet 0   No facility-administered medications prior to visit.     ROS Review of Systems  Constitutional:  Negative for activity change and appetite change.  HENT:  Negative for sinus pressure and sore throat.   Respiratory:  Negative for chest tightness, shortness of breath and wheezing.   Cardiovascular:  Negative for chest pain and palpitations.  Gastrointestinal:  Negative for abdominal distention, abdominal pain and constipation.  Genitourinary: Negative.   Musculoskeletal:        See HPI  Psychiatric/Behavioral:  Negative for behavioral problems and dysphoric mood.     Objective:  BP 102/69   Pulse 83   Ht  (1.702 m)   Wt 135 lb 6.4 oz (61.4 kg)   LMP 03/16/2018 Comment: negative urine preg test 01/14/20  SpO2 98%   BMI 21.21 kg/m      11/21/2022    9:19 AM 08/15/2022    9:27 AM 05/11/2022   10:22 AM  BP/Weight  Systolic BP 102 109 115  Diastolic BP 69 73 74  Wt. (Lbs) 135.4 137.8 138.2  BMI 21.21 kg/m2 21.58 kg/m2 21.65 kg/m2      Physical Exam Constitutional:      Appearance: She is well-developed.  Cardiovascular:     Rate and Rhythm: Normal rate.     Heart  sounds: Normal heart sounds. No murmur heard. Pulmonary:     Effort: Pulmonary effort is normal.     Breath sounds: Normal breath sounds. No wheezing or rales.  Chest:     Chest wall: No tenderness.  Abdominal:     General: Bowel sounds are normal. There is no distension.     Palpations: Abdomen is soft. There is no mass.     Tenderness: There is no  abdominal tenderness.  Musculoskeletal:        General: No swelling or tenderness. Normal range of motion.     Right lower leg: No edema.     Left lower leg: No edema.  Neurological:     Mental Status: She is alert and oriented to person, place, and time.  Psychiatric:        Mood and Affect: Mood normal.        Latest Ref Rng & Units 08/15/2022   10:10 AM 04/10/2022    4:45 PM 07/06/2021    3:04 AM  CMP  Glucose 70 - 99 mg/dL 92  161  096   BUN 6 - 20 mg/dL 8  5  21    Creatinine 0.57 - 1.00 mg/dL 0.45  4.09  8.11   Sodium 134 - 144 mmol/L 138  138  141   Potassium 3.5 - 5.2 mmol/L 4.2  3.9  3.8   Chloride 96 - 106 mmol/L 94  98  111   CO2 20 - 29 mmol/L 28  23  20    Calcium 8.7 - 10.2 mg/dL 9.4  9.5  8.0   Total Protein 6.0 - 8.5 g/dL 7.3  7.6  6.2   Total Bilirubin 0.0 - 1.2 mg/dL <9.1  0.3  0.7   Alkaline Phos 44 - 121 IU/L 97  101  80   AST 0 - 40 IU/L 21  20  50   ALT 0 - 32 IU/L 23  22  33     Lipid Panel     Component Value Date/Time   CHOL 278 (H) 08/15/2022 1010   TRIG 113 08/15/2022 1010   HDL 131 08/15/2022 1010   CHOLHDL 2.0 02/05/2020 1152   CHOLHDL 2.1 11/25/2019 0920   VLDL 16 11/25/2019 0920   LDLCALC 129 (H) 08/15/2022 1010    CBC    Component Value Date/Time   WBC 4.2 07/06/2021 0304   RBC 3.65 (L) 07/06/2021 0304   HGB 11.0 (L) 07/06/2021 0304   HCT 33.4 (L) 07/06/2021 0304   PLT 105 (L) 07/06/2021 0304   MCV 91.5 07/06/2021 0304   MCH 30.1 07/06/2021 0304   MCHC 32.9 07/06/2021 0304   RDW 16.0 (H) 07/06/2021 0304   LYMPHSABS 4.1 (H) 07/03/2021 1025   MONOABS 1.7 (H) 07/03/2021 1025    EOSABS 0.0 07/03/2021 1025   BASOSABS 0.1 07/03/2021 1025    Lab Results  Component Value Date   HGBA1C 10.5 (A) 11/21/2022    Assessment & Plan:  1. Type 1 diabetes mellitus with hyperglycemia Uncontrolled with A1c of 10.5 but this has trended down from 11.5 previously Nonadherent with a diabetic diet Due to her episodes of hypoglycemia we will need to exercise caution with regards to adjusting her regimen Strongly encouraged to work on her diet and eat small frequent meals less than large meals Counseled on Diabetic diet, my plate method, 478 minutes of moderate intensity exercise/week Blood sugar logs with fasting goals of 80-120 mg/dl, random of less than 295 and in the event of sugars less than 60 mg/dl or greater than 621 mg/dl encouraged to notify the clinic. Advised on the need for annual eye exams, annual foot exams, Pneumonia vaccine. - POCT glucose (manual entry) - POCT glycosylated hemoglobin (Hb A1C)  2. Chronic pain of right knee Symptoms absent at the moment Placed on naproxen Osteoarthritis versus autoimmune etiology and will also need to exclude gout - Uric Acid - Sedimentation rate - C-reactive protein -  Anti-Smith antibody - Anti-DNA antibody, double-stranded - ANA,IFA RA Diag Pnl w/rflx Tit/Patn - DG Knee Complete 4 Views Right; Future  3. Anxiety and depression Improved Previously followed by psych but could not keep up with him and requested She needs to make appointment as she has been provided resources to schedule appointment with psych   4. Sciatica, right side Stable. - gabapentin (NEURONTIN) 300 MG capsule; Take 2 capsules (600 mg total) by mouth 3 (three) times daily as needed.  Dispense: 180 capsule; Refill: 2    Meds ordered this encounter  Medications   naproxen (NAPROSYN) 500 MG tablet    Sig: Take 1 tablet (500 mg total) by mouth 2 (two) times daily with a meal.    Dispense:  60 tablet    Refill:  1   gabapentin (NEURONTIN) 300 MG  capsule    Sig: Take 2 capsules (600 mg total) by mouth 3 (three) times daily as needed.    Dispense:  180 capsule    Refill:  2    Follow-up: Return in about 3 months (around 02/20/2023).       Hoy Register, MD, FAAFP. [facility] [place], [place] [phone]   11/21/2022, 9:55 AM

## 2022-11-21 NOTE — Progress Notes (Signed)
Pain and swelling in right knee.

## 2022-11-21 NOTE — Patient Instructions (Signed)

## 2022-11-22 ENCOUNTER — Other Ambulatory Visit: Payer: Self-pay

## 2022-11-25 LAB — URIC ACID: Uric Acid: 3.1 mg/dL (ref 2.6–6.2)

## 2022-11-25 LAB — ANA,IFA RA DIAG PNL W/RFLX TIT/PATN
ANA Titer 1: NEGATIVE
Cyclic Citrullin Peptide Ab: 7 units (ref 0–19)
Rheumatoid fact SerPl-aCnc: <10 IU/mL (ref <14.0)

## 2022-11-25 LAB — SEDIMENTATION RATE: Sed Rate: 26 mm/hr (ref 0–32)

## 2022-11-25 LAB — ANTI-SMITH ANTIBODY: ENA SM Ab Ser-aCnc: <0.2 AI (ref 0.0–0.9)

## 2022-11-25 LAB — ANTI-DNA ANTIBODY, DOUBLE-STRANDED: dsDNA Ab: 2 IU/mL (ref 0–9)

## 2022-11-25 LAB — C-REACTIVE PROTEIN: CRP: <1 mg/L (ref 0–10)

## 2022-11-27 ENCOUNTER — Telehealth: Payer: Self-pay

## 2022-11-27 NOTE — Telephone Encounter (Signed)
Pt was called and vm was left, Information has been sent to nurse pool.   

## 2022-11-27 NOTE — Telephone Encounter (Signed)
-----   Message from Guy Franco, RN sent at 11/27/2022  2:51 PM EDT -----  ----- Message ----- From: Hoy Register, MD Sent: 11/25/2022  10:08 AM EDT To: Ronette Deter, CMA  Please inform the patient that labs are normal. Thank you.

## 2022-12-21 ENCOUNTER — Other Ambulatory Visit: Payer: Self-pay

## 2022-12-21 ENCOUNTER — Other Ambulatory Visit: Payer: Self-pay | Admitting: Family Medicine

## 2022-12-21 DIAGNOSIS — E1065 Type 1 diabetes mellitus with hyperglycemia: Secondary | ICD-10-CM

## 2022-12-21 MED ORDER — FREESTYLE LIBRE 2 SENSOR MISC
3 refills | Status: DC
Start: 2022-12-21 — End: 2023-04-17
  Filled 2022-12-21: qty 2, 28d supply, fill #0
  Filled 2023-01-07 – 2023-01-11 (×3): qty 2, 28d supply, fill #1
  Filled 2023-02-06: qty 2, 28d supply, fill #2
  Filled 2023-02-28: qty 2, 28d supply, fill #3

## 2022-12-22 ENCOUNTER — Encounter: Payer: Self-pay | Admitting: Family Medicine

## 2022-12-22 ENCOUNTER — Telehealth: Payer: Medicaid Other | Admitting: Physician Assistant

## 2022-12-22 DIAGNOSIS — R112 Nausea with vomiting, unspecified: Secondary | ICD-10-CM

## 2022-12-22 MED ORDER — ONDANSETRON HCL 4 MG PO TABS
4.0000 mg | ORAL_TABLET | Freq: Three times a day (TID) | ORAL | 0 refills | Status: DC | PRN
Start: 2022-12-22 — End: 2023-02-26

## 2022-12-22 NOTE — Progress Notes (Signed)
E-Visit for Nausea and Vomiting   We are sorry that you are not feeling well. Here is how we plan to help!  Based on what you have shared with me it looks like you have a Virus that is irritating your GI tract.  Vomiting is the forceful emptying of a portion of the stomach's content through the mouth.  Although nausea and vomiting can make you feel miserable, it's important to remember that these are not diseases, but rather symptoms of an underlying illness.  When we treat short term symptoms, we always caution that any symptoms that persist should be fully evaluated in a medical office.  I have prescribed a medication that will help alleviate your symptoms and allow you to stay hydrated:  Zofran 4 mg 1 tablet every 8 hours as needed for nausea and vomiting  HOME CARE: Drink clear liquids.  This is very important! Dehydration (the lack of fluid) can lead to a serious complication.  Start off with 1 tablespoon every 5 minutes for 8 hours. You may begin eating bland foods after 8 hours without vomiting.  Start with saltine crackers, white bread, rice, mashed potatoes, applesauce. After 48 hours on a bland diet, you may resume a normal diet. Try to go to sleep.  Sleep often empties the stomach and relieves the need to vomit.  GET HELP RIGHT AWAY IF:  Your symptoms do not improve or worsen within 2 days after treatment. You have a fever for over 3 days. You cannot keep down fluids after trying the medication.  MAKE SURE YOU:  Understand these instructions. Will watch your condition. Will get help right away if you are not doing well or get worse.    Thank you for choosing an e-visit.  Your e-visit answers were reviewed by a board certified advanced clinical practitioner to complete your personal care plan. Depending upon the condition, your plan could have included both over the counter or prescription medications.  Please review your pharmacy choice. Make sure the pharmacy is open so  you can pick up prescription now. If there is a problem, you may contact your provider through MyChart messaging and have the prescription routed to another pharmacy.  Your safety is important to us. If you have drug allergies check your prescription carefully.   For the next 24 hours you can use MyChart to ask questions about today's visit, request a non-urgent call back, or ask for a work or school excuse. You will get an email in the next two days asking about your experience. I hope that your e-visit has been valuable and will speed your recovery.  I have spent 5 minutes in review of e-visit questionnaire, review and updating patient chart, medical decision making and response to patient.   [name] M Infant Doane, PA-C  

## 2023-01-04 ENCOUNTER — Ambulatory Visit: Payer: Medicaid Other | Attending: Family Medicine | Admitting: Family Medicine

## 2023-01-04 DIAGNOSIS — M5431 Sciatica, right side: Secondary | ICD-10-CM

## 2023-01-04 DIAGNOSIS — R110 Nausea: Secondary | ICD-10-CM

## 2023-01-04 NOTE — Progress Notes (Signed)
Virtual Visit via Telephone Note  I connected with Madison Williams, on 01/04/2023 at 8:22 AM by telephone and verified that I am speaking with the correct person using two identifiers.   Consent: I discussed the limitations, risks, security and privacy concerns of performing an evaluation and management service by telephone and the availability of in person appointments. I also discussed with the patient that there may be a patient responsible charge related to this service. The patient expressed understanding and agreed to proceed.   Location of Patient: Work  Government social research officer of Provider: Clinic   Persons participating in Telemedicine visit: Madison Williams Dr. Alvis Lemmings     History of Present Illness: Shardi Dauphinais is a 37 y.o. year old female with  a history of hypertension, type 1 diabetes mellitus diagnosed in 2017 (A1c 11.5), rheumatoid arthritis, alcohol abuse, anxiety.    She works at the [employer] at [employer] and is requiring completion of FMLA paperwork so that when she is out sick either due to nausea which occurs frequently or her sciatica pain no hypoglycemia from diabetes she will not lose her job.  She will require 1 time per month and for each episode about 2 days.  Past Medical History:  Diagnosis Date   Asthma    Diabetes mellitus without complication (HCC)    Rheumatoid arthritis (HCC)    No Known Allergies  Current Outpatient Medications on File Prior to Visit  Medication Sig Dispense Refill   atorvastatin (LIPITOR) 20 MG tablet Take 1 tablet (20 mg total) by mouth daily. (Patient not taking: Reported on 11/21/2022) 30 tablet 3   clonazePAM (KLONOPIN) 1 MG tablet Take 1 mg by mouth 2 (two) times daily as needed for anxiety.     Continuous Blood Gluc Receiver (FREESTYLE LIBRE 2 READER) DEVI Use to check blood sugar three times daily. 1 each 0   Continuous Glucose Sensor (FREESTYLE LIBRE 2 SENSOR) MISC Use to check blood sugar three times  daily. 2 each 3   cyclobenzaprine (FLEXERIL) 5 MG tablet Take 1-2 tablets (5-10 mg total) by mouth 3 (three) times daily as needed for muscle spasms. 30 tablet 1   FLUoxetine (PROZAC) 20 MG capsule Take 20 mg by mouth 2 (two) times daily.     gabapentin (NEURONTIN) 300 MG capsule Take 2 capsules (600 mg total) by mouth 3 (three) times daily as needed. 180 capsule 2   ibuprofen (ADVIL) 200 MG tablet Take 200 mg by mouth every 6 (six) hours as needed for moderate pain.     insulin glargine (LANTUS) 100 UNIT/ML injection Inject 0.1 mLs (10 Units total) into the skin in the morning AND 0.3 mLs (30 Units total) at bedtime. 30 mL 3   insulin lispro (HUMALOG) 100 UNIT/ML injection Inject 0-0.12 mLs (0-12 Units total) into the skin 3 (three) times daily with meals. Per sliding scale 30 mL 3   Insulin Syringe-Needle U-100 (TRUEPLUS INSULIN SYRINGE) 31G X 5/16" 0.5 ML MISC Use to inject insulin daily. 100 each 2   naproxen (NAPROSYN) 500 MG tablet Take 1 tablet (500 mg total) by mouth 2 (two) times daily with a meal. 60 tablet 1   ondansetron (ZOFRAN) 4 MG tablet Take 1 tablet (4 mg total) by mouth every 8 (eight) hours as needed for nausea or vomiting. 20 tablet 0   promethazine (PHENERGAN) 25 MG tablet Take 1 tablet (25 mg total) by mouth every 6 (six) hours as needed for nausea or vomiting. (Patient not taking:  Reported on 11/21/2022) 30 tablet 0   No current facility-administered medications on file prior to visit.    ROS: See HPI  Observations/Objective: Awake, alert, oriented x3 Not in acute distress Normal mood      Latest Ref Rng & Units 08/15/2022   10:10 AM 04/10/2022    4:45 PM 07/06/2021    3:04 AM  CMP  Glucose 70 - 99 mg/dL 92  295  621   BUN 6 - 20 mg/dL 8  5  21    Creatinine 0.57 - 1.00 mg/dL 3.08  6.57  8.46   Sodium 134 - 144 mmol/L 138  138  141   Potassium 3.5 - 5.2 mmol/L 4.2  3.9  3.8   Chloride 96 - 106 mmol/L 94  98  111   CO2 20 - 29 mmol/L 28  23  20    Calcium 8.7 -  10.2 mg/dL 9.4  9.5  8.0   Total Protein 6.0 - 8.5 g/dL 7.3  7.6  6.2   Total Bilirubin 0.0 - 1.2 mg/dL <9.6  0.3  0.7   Alkaline Phos 44 - 121 IU/L 97  101  80   AST 0 - 40 IU/L 21  20  50   ALT 0 - 32 IU/L 23  22  33     Lipid Panel     Component Value Date/Time   CHOL 278 (H) 08/15/2022 1010   TRIG 113 08/15/2022 1010   HDL 131 08/15/2022 1010   CHOLHDL 2.0 02/05/2020 1152   CHOLHDL 2.1 11/25/2019 0920   VLDL 16 11/25/2019 0920   LDLCALC 129 (H) 08/15/2022 1010   LABVLDL 18 08/15/2022 1010    Lab Results  Component Value Date   HGBA1C 10.5 (A) 11/21/2022    Assessment and Plan: 1. Sciatica, right side Intermittent flares FMLA paperwork completed  2. Nausea Occurs intermittently She had an ED visit for nausea and vomiting 2 weeks ago   Follow Up Instructions: Keep previously scheduled appointment.   I discussed the assessment and treatment plan with the patient. The patient was provided an opportunity to ask questions and all were answered. The patient agreed with the plan and demonstrated an understanding of the instructions.   The patient was advised to call back or seek an in-person evaluation if the symptoms worsen or if the condition fails to improve as anticipated.     I provided 11 minutes total of non-face-to-face time during this encounter.   Hoy Register, MD, FAAFP. [facility] [place], [place] [phone]   01/04/2023, 8:22 AM

## 2023-01-08 ENCOUNTER — Other Ambulatory Visit: Payer: Self-pay

## 2023-01-09 ENCOUNTER — Other Ambulatory Visit: Payer: Self-pay

## 2023-01-15 ENCOUNTER — Other Ambulatory Visit (HOSPITAL_COMMUNITY): Payer: Self-pay

## 2023-01-16 ENCOUNTER — Other Ambulatory Visit: Payer: Self-pay

## 2023-02-06 ENCOUNTER — Other Ambulatory Visit: Payer: Self-pay

## 2023-02-07 ENCOUNTER — Other Ambulatory Visit: Payer: Self-pay

## 2023-02-15 ENCOUNTER — Encounter: Payer: Self-pay | Admitting: Family Medicine

## 2023-02-26 ENCOUNTER — Other Ambulatory Visit: Payer: Self-pay

## 2023-02-26 ENCOUNTER — Ambulatory Visit: Payer: Medicaid Other | Attending: Family Medicine | Admitting: Family Medicine

## 2023-02-26 ENCOUNTER — Encounter: Payer: Self-pay | Admitting: Family Medicine

## 2023-02-26 ENCOUNTER — Other Ambulatory Visit: Payer: Self-pay | Admitting: Family Medicine

## 2023-02-26 VITALS — BP 108/68 | HR 72 | Ht 67.0 in | Wt 137.4 lb

## 2023-02-26 DIAGNOSIS — F418 Other specified anxiety disorders: Secondary | ICD-10-CM | POA: Insufficient documentation

## 2023-02-26 DIAGNOSIS — M069 Rheumatoid arthritis, unspecified: Secondary | ICD-10-CM | POA: Insufficient documentation

## 2023-02-26 DIAGNOSIS — F419 Anxiety disorder, unspecified: Secondary | ICD-10-CM

## 2023-02-26 DIAGNOSIS — E1065 Type 1 diabetes mellitus with hyperglycemia: Secondary | ICD-10-CM | POA: Insufficient documentation

## 2023-02-26 DIAGNOSIS — E1169 Type 2 diabetes mellitus with other specified complication: Secondary | ICD-10-CM

## 2023-02-26 DIAGNOSIS — M25569 Pain in unspecified knee: Secondary | ICD-10-CM | POA: Insufficient documentation

## 2023-02-26 DIAGNOSIS — E785 Hyperlipidemia, unspecified: Secondary | ICD-10-CM | POA: Diagnosis not present

## 2023-02-26 DIAGNOSIS — Z794 Long term (current) use of insulin: Secondary | ICD-10-CM

## 2023-02-26 DIAGNOSIS — J45909 Unspecified asthma, uncomplicated: Secondary | ICD-10-CM | POA: Insufficient documentation

## 2023-02-26 DIAGNOSIS — U070 Vaping-related disorder: Secondary | ICD-10-CM

## 2023-02-26 DIAGNOSIS — Z87891 Personal history of nicotine dependence: Secondary | ICD-10-CM | POA: Insufficient documentation

## 2023-02-26 DIAGNOSIS — M5431 Sciatica, right side: Secondary | ICD-10-CM

## 2023-02-26 DIAGNOSIS — R112 Nausea with vomiting, unspecified: Secondary | ICD-10-CM | POA: Diagnosis not present

## 2023-02-26 DIAGNOSIS — I10 Essential (primary) hypertension: Secondary | ICD-10-CM | POA: Insufficient documentation

## 2023-02-26 DIAGNOSIS — E109 Type 1 diabetes mellitus without complications: Secondary | ICD-10-CM | POA: Diagnosis present

## 2023-02-26 DIAGNOSIS — F32A Depression, unspecified: Secondary | ICD-10-CM

## 2023-02-26 LAB — POCT GLYCOSYLATED HEMOGLOBIN (HGB A1C): HbA1c, POC (controlled diabetic range): 10 % — AB (ref 0.0–7.0)

## 2023-02-26 MED ORDER — INSULIN GLARGINE 100 UNIT/ML ~~LOC~~ SOLN
SUBCUTANEOUS | 3 refills | Status: DC
Start: 2023-02-26 — End: 2023-06-05
  Filled 2023-02-26 – 2023-06-01 (×3): qty 30, 75d supply, fill #0

## 2023-02-26 MED ORDER — GABAPENTIN 300 MG PO CAPS
600.0000 mg | ORAL_CAPSULE | Freq: Three times a day (TID) | ORAL | 2 refills | Status: DC | PRN
Start: 2023-02-26 — End: 2023-06-29
  Filled 2023-02-26 – 2023-02-28 (×2): qty 180, 30d supply, fill #0
  Filled 2023-04-18: qty 180, 30d supply, fill #1
  Filled 2023-05-20: qty 180, 30d supply, fill #2

## 2023-02-26 MED ORDER — "INSULIN SYRINGE-NEEDLE U-100 31G X 5/16"" 0.5 ML MISC"
2 refills | Status: DC
  Filled 2023-02-26: qty 100, fill #0
  Filled 2023-04-18: qty 100, 100d supply, fill #0
  Filled 2023-05-20 – 2023-06-01 (×2): qty 100, 100d supply, fill #1
  Filled 2023-06-01: qty 100, 25d supply, fill #1

## 2023-02-26 MED ORDER — ONDANSETRON HCL 4 MG PO TABS
4.0000 mg | ORAL_TABLET | Freq: Three times a day (TID) | ORAL | 0 refills | Status: DC | PRN
  Filled 2023-02-26: qty 20, 7d supply, fill #0

## 2023-02-26 NOTE — Progress Notes (Signed)
Subjective:  Patient ID: Madison Williams, female    DOB: 1985-09-11  Age: 37 y.o. MRN: [id number]  CC: Medical Management of Chronic Issues   HPI Madison Williams is a 37 y.o. year old female with a history of hypertension, type 1 diabetes mellitus diagnosed in 2017 (A1c 11.5), rheumatoid arthritis, alcohol abuse, anxiety.   Interval History: Discussed the use of AI scribe software for clinical note transcription with the patient, who gave verbal consent to proceed.  She presents for a follow-up visit. She reports feeling "very tired, a lot" but does not elaborate further on this symptom. She has been seeing a psychiatrist and a therapist at the [facility] for anxiety. She is currently paying out of pocket for these services.  The patient's knee pain fluctuates in severity throughout the week, with the most pain and swelling occurring towards the end of the week. She has been wearing a brace for support when the pain is at its worst. She has not yet had imaging done on the knee which was ordered at her last visit.  She reports that her blood sugars have been improving and she feels she is getting better at managing her diabetes. She is using a CGM device to monitor her blood sugars, which she finds very helpful. She is currently taking Lantus 10 units in the morning and 30 units at night, with a sliding scale of zero to twelve units of NovoLog three times a day with meals. Her most recent A1c was 10.0, which is down from previous levels. She reports having at least one hypoglycemic episode a day, which she finds concerning as she no longer feels these episodes coming on.  She skips her breakfast.  She has been skipping her NovoLog when her blood sugar is already low to prevent further drops.  She also has spikes in blood sugars to about 350 which typically occur around noon for example yesterday she had some coffee and with bagel with cream cheese.   She has an appointment with an  endocrinologist scheduled for November.        Past Medical History:  Diagnosis Date   Asthma    Diabetes mellitus without complication (HCC)    Rheumatoid arthritis (HCC)     Past Surgical History:  Procedure Laterality Date   LAPAROSCOPIC OVARIAN     LEFT HEART CATH AND CORONARY ANGIOGRAPHY N/A 11/24/2019   Procedure: LEFT HEART CATH AND CORONARY ANGIOGRAPHY;  Surgeon: Kathleene Hazel, MD;  Location: [facility];  Service: Cardiovascular;  Laterality: N/A;    Family History  Family history unknown: Yes    Social History   Socioeconomic History   Marital status: Single    Spouse name: Not on file   Number of children: 1   Years of education: Not on file   Highest education level: Some college, no degree  Occupational History   Not on file  Tobacco Use   Smoking status: Former    Current packs/day: 1.00    Types: Cigarettes   Smokeless tobacco: Never  Vaping Use   Vaping status: Every Day   Substances: Nicotine  Substance and Sexual Activity   Alcohol use: Not Currently   Drug use: Yes    Comment: [place]   Sexual activity: Not on file  Other Topics Concern   Not on file  Social History Narrative   Not on file   Social Determinants of Health   Financial Resource Strain: High Risk (02/19/2023)  Overall Financial Resource Strain (CARDIA)    Difficulty of Paying Living Expenses: Hard  Food Insecurity: Food Insecurity Present (02/19/2023)   Hunger Vital Sign    Worried About Running Out of Food in the Last Year: Often true    Ran Out of Food in the Last Year: Sometimes true  Transportation Needs: Unmet Transportation Needs (02/19/2023)   PRAPARE - Transportation    Lack of Transportation (Medical): Yes    Lack of Transportation (Non-Medical): Yes  Physical Activity: Unknown (02/19/2023)   Exercise Vital Sign    Days of Exercise per Week: 0 days    Minutes of Exercise per Session: Not on file  Stress: Stress Concern Present (02/19/2023)    [employer] of Occupational Health - Occupational Stress Questionnaire    Feeling of Stress : Rather much  Social Connections: Socially Isolated (02/19/2023)   Social Connection and Isolation Panel [NHANES]    Frequency of Communication with Friends and Family: Never    Frequency of Social Gatherings with Friends and Family: Never    Attends Religious Services: 1 to 4 times per year    Active Member of [employer] or Organizations: No    Attends Engineer, structural: Not on file    Marital Status: Never married    No Known Allergies  Outpatient Medications Prior to Visit  Medication Sig Dispense Refill   atorvastatin (LIPITOR) 20 MG tablet Take 1 tablet (20 mg total) by mouth daily. 30 tablet 3   clonazePAM (KLONOPIN) 1 MG tablet Take 1 mg by mouth 2 (two) times daily as needed for anxiety.     Continuous Blood Gluc Receiver (FREESTYLE LIBRE 2 READER) DEVI Use to check blood sugar three times daily. 1 each 0   Continuous Glucose Sensor (FREESTYLE LIBRE 2 SENSOR) MISC Use to check blood sugar three times daily. 2 each 3   cyclobenzaprine (FLEXERIL) 5 MG tablet Take 1-2 tablets (5-10 mg total) by mouth 3 (three) times daily as needed for muscle spasms. 30 tablet 1   FLUoxetine (PROZAC) 20 MG capsule Take 20 mg by mouth 2 (two) times daily.     ibuprofen (ADVIL) 200 MG tablet Take 200 mg by mouth every 6 (six) hours as needed for moderate pain.     insulin lispro (HUMALOG) 100 UNIT/ML injection Inject 0-0.12 mLs (0-12 Units total) into the skin 3 (three) times daily with meals. Per sliding scale 30 mL 3   naproxen (NAPROSYN) 500 MG tablet Take 1 tablet (500 mg total) by mouth 2 (two) times daily with a meal. 60 tablet 1   promethazine (PHENERGAN) 25 MG tablet Take 1 tablet (25 mg total) by mouth every 6 (six) hours as needed for nausea or vomiting. 30 tablet 0   gabapentin (NEURONTIN) 300 MG capsule Take 2 capsules (600 mg total) by mouth 3 (three) times daily as needed. 180 capsule 2    insulin glargine (LANTUS) 100 UNIT/ML injection Inject 0.1 mLs (10 Units total) into the skin in the morning AND 0.3 mLs (30 Units total) at bedtime. 30 mL 3   Insulin Syringe-Needle U-100 (TRUEPLUS INSULIN SYRINGE) 31G X 5/16" 0.5 ML MISC Use to inject insulin daily. 100 each 2   ondansetron (ZOFRAN) 4 MG tablet Take 1 tablet (4 mg total) by mouth every 8 (eight) hours as needed for nausea or vomiting. 20 tablet 0   No facility-administered medications prior to visit.     ROS Review of Systems  Constitutional:  Negative for activity change and appetite change.  HENT:  Negative for sinus pressure and sore throat.   Respiratory:  Negative for chest tightness, shortness of breath and wheezing.   Cardiovascular:  Negative for chest pain and palpitations.  Gastrointestinal:  Negative for abdominal distention, abdominal pain and constipation.  Genitourinary: Negative.   Musculoskeletal: Negative.   Psychiatric/Behavioral:  Negative for behavioral problems and dysphoric mood.     Objective:  BP 108/68   Pulse 72   Ht 5\' 7"  (1.702 m)   Wt 137 lb 6.4 oz (62.3 kg)   LMP 03/16/2018 Comment: negative urine preg test 01/14/20  SpO2 99%   BMI 21.52 kg/m      02/26/2023    1:44 PM 11/21/2022    9:19 AM 08/15/2022    9:27 AM  BP/Weight  Systolic BP 108 102 109  Diastolic BP 68 69 73  Wt. (Lbs) 137.4 135.4 137.8  BMI 21.52 kg/m2 21.21 kg/m2 21.58 kg/m2      Physical Exam Constitutional:      Appearance: She is well-developed.  Cardiovascular:     Rate and Rhythm: Normal rate.     Heart sounds: Normal heart sounds. No murmur heard. Pulmonary:     Effort: Pulmonary effort is normal.     Breath sounds: Normal breath sounds. No wheezing or rales.  Chest:     Chest wall: No tenderness.  Abdominal:     General: Bowel sounds are normal. There is no distension.     Palpations: Abdomen is soft. There is no mass.     Tenderness: There is no abdominal tenderness.  Musculoskeletal:         General: Normal range of motion.     Right lower leg: No edema.     Left lower leg: No edema.  Neurological:     Mental Status: She is alert and oriented to person, place, and time.  Psychiatric:        Mood and Affect: Mood normal.        Latest Ref Rng & Units 08/15/2022   10:10 AM 04/10/2022    4:45 PM 07/06/2021    3:04 AM  CMP  Glucose 70 - 99 mg/dL 92  332  951   BUN 6 - 20 mg/dL 8  5  21    Creatinine 0.57 - 1.00 mg/dL 8.84  1.66  0.63   Sodium 134 - 144 mmol/L 138  138  141   Potassium 3.5 - 5.2 mmol/L 4.2  3.9  3.8   Chloride 96 - 106 mmol/L 94  98  111   CO2 20 - 29 mmol/L 28  23  20    Calcium 8.7 - 10.2 mg/dL 9.4  9.5  8.0   Total Protein 6.0 - 8.5 g/dL 7.3  7.6  6.2   Total Bilirubin 0.0 - 1.2 mg/dL <0.1  0.3  0.7   Alkaline Phos 44 - 121 IU/L 97  101  80   AST 0 - 40 IU/L 21  20  50   ALT 0 - 32 IU/L 23  22  33     Lipid Panel     Component Value Date/Time   CHOL 278 (H) 08/15/2022 1010   TRIG 113 08/15/2022 1010   HDL 131 08/15/2022 1010   CHOLHDL 2.0 02/05/2020 1152   CHOLHDL 2.1 11/25/2019 0920   VLDL 16 11/25/2019 0920   LDLCALC 129 (H) 08/15/2022 1010    CBC    Component Value Date/Time   WBC 4.2 07/06/2021 0304   RBC 3.65 (L)  07/06/2021 0304   HGB 11.0 (L) 07/06/2021 0304   HCT 33.4 (L) 07/06/2021 0304   PLT 105 (L) 07/06/2021 0304   MCV 91.5 07/06/2021 0304   MCH 30.1 07/06/2021 0304   MCHC 32.9 07/06/2021 0304   RDW 16.0 (H) 07/06/2021 0304   LYMPHSABS 4.1 (H) 07/03/2021 1025   MONOABS 1.7 (H) 07/03/2021 1025   EOSABS 0.0 07/03/2021 1025   BASOSABS 0.1 07/03/2021 1025    Lab Results  Component Value Date   HGBA1C 10.0 (A) 02/26/2023    Assessment & Plan:      Type 2 Diabetes Mellitus: Poor glycemic control with A1c of 10.0 and frequent hypoglycemic episodes. Patient is on Lantus 10 units in the morning, 30 at night, and Novolog sliding scale 0-12 units three times a day with meals. Patient reports skipping Novolog when sugars  are low. Patient's glucose meter shows 7-day average of 197, 12-day average of 220. Patient reports difficulty with diet management and carb counting. -Continue current insulin regimen. -Refer to nutritionist for diet management and carb counting education. -Encourage patient to maintain consistent meal times and portions to prevent glucose spikes and drops. -Repeat A1c at next visit. -Appointment with endocrinology is not until the end of the year.  Knee Pain: Patient reports worsening knee pain and swelling towards the end of the week, requiring a brace. Patient has not yet had imaging done. -Encourage patient to get knee imaging done as soon as possible, regardless of current pain level.  Hyperlipidemia: Last lipid check in January was high. Patient had eaten prior to today's visit, so cholesterol was not checked. -Repeat lipid panel at next visit, preferably a morning appointment.  Anxiety and Depression: Patient is seeing a psychiatrist and therapist at the [facility], but is paying out of pocket. -Continue current mental health treatment. -Consider exploring other mental health resources that may accept patient's insurance.  General Health Maintenance: -Continue to monitor blood glucose levels with glucose meter. -Encourage self-care and mindfulness practices to manage stress.           Meds ordered this encounter  Medications   gabapentin (NEURONTIN) 300 MG capsule    Sig: Take 2 capsules (600 mg total) by mouth 3 (three) times daily as needed.    Dispense:  180 capsule    Refill:  2   Insulin Syringe-Needle U-100 (TRUEPLUS INSULIN SYRINGE) 31G X 5/16" 0.5 ML MISC    Sig: Use to inject insulin daily.    Dispense:  100 each    Refill:  2   ondansetron (ZOFRAN) 4 MG tablet    Sig: Take 1 tablet (4 mg total) by mouth every 8 (eight) hours as needed for nausea or vomiting.    Dispense:  20 tablet    Refill:  0   insulin glargine (LANTUS) 100 UNIT/ML injection    Sig:  Inject 0.1 mLs (10 Units total) into the skin in the morning AND 0.3 mLs (30 Units total) at bedtime.    Dispense:  30 mL    Refill:  3    Follow-up: Return in about 3 months (around 05/29/2023) for Chronic medical conditions.       Hoy Register, MD, FAAFP. [facility] [place], [place] [phone]   02/26/2023, 4:33 PM

## 2023-02-26 NOTE — Patient Instructions (Signed)

## 2023-02-28 ENCOUNTER — Telehealth: Payer: Self-pay

## 2023-02-28 NOTE — Telephone Encounter (Signed)
Hi Courtney,  Please see the MyChart messages regarding therapy for this patient.    Dr Alvis Lemmings had asked me to contact her to see if she was interested in changing therapy providers because she has been paying out of pocket to go to the [facility].  I am surprised that the [facility] does not bill Medicaid.   She is now interested in connecting with another provider.  Can you please assist her?    Thanks

## 2023-03-01 ENCOUNTER — Other Ambulatory Visit: Payer: Self-pay

## 2023-03-05 NOTE — Telephone Encounter (Signed)
Ofcourse I don't mind, I have called the pt and will offer her a chance to see me and from there I will assist her with an outside therapist. I've called a few times and left a message. Since she has not answered or called back I have sent a my chart message as well.

## 2023-04-17 ENCOUNTER — Other Ambulatory Visit: Payer: Self-pay | Admitting: Family Medicine

## 2023-04-17 DIAGNOSIS — E1065 Type 1 diabetes mellitus with hyperglycemia: Secondary | ICD-10-CM

## 2023-04-18 ENCOUNTER — Other Ambulatory Visit: Payer: Self-pay

## 2023-04-18 ENCOUNTER — Telehealth: Payer: Self-pay | Admitting: Family Medicine

## 2023-04-18 ENCOUNTER — Other Ambulatory Visit: Payer: Self-pay | Admitting: Family Medicine

## 2023-04-18 DIAGNOSIS — E1065 Type 1 diabetes mellitus with hyperglycemia: Secondary | ICD-10-CM

## 2023-04-18 MED ORDER — FREESTYLE LIBRE 2 SENSOR MISC
3 refills | Status: DC
Start: 2023-04-18 — End: 2023-05-02
  Filled 2023-04-18: qty 2, 28d supply, fill #0
  Filled 2023-04-20 – 2023-05-02 (×6): qty 2, 28d supply, fill #1

## 2023-04-18 NOTE — Telephone Encounter (Signed)
Requested Prescriptions  Pending Prescriptions Disp Refills   Continuous Glucose Sensor (FREESTYLE LIBRE 2 SENSOR) MISC 2 each 3    Sig: Use to check blood sugar three times daily.     Endocrinology: Diabetes - Testing Supplies Passed - 04/17/2023  8:00 AM      Passed - Valid encounter within last 12 months    Recent Outpatient Visits           1 month ago Type 1 diabetes mellitus with hyperglycemia (HCC)   [place] [facility] Hoy Register, MD   3 months ago Sciatica, right side   [facility], Odette Horns, MD   4 months ago Type 1 diabetes mellitus with hyperglycemia [facility])   [place] [facility] [place], Odette Horns, MD   8 months ago Type 1 diabetes mellitus with hyperglycemia [facility])   [place] [facility] Hoy Register, MD   11 months ago Annual physical exam   [place] [facility] Hoy Register, MD       Future Appointments             In 1 month Hoy Register, MD [facility]   In 1 month Shamleffer, Konrad Dolores, MD [facility] Endocrinology

## 2023-04-18 NOTE — Telephone Encounter (Signed)
Sent to pharmacy today in a separate refill encounter.

## 2023-04-18 NOTE — Telephone Encounter (Signed)
Medication Refill - Medication: Continuous Glucose Sensor (FREESTYLE LIBRE 2 SENSOR) MISC [[id number]]   Has the patient contacted their pharmacy? Yes.     (Agent: If yes, when and what did the pharmacy advise?) Contact Provider   Preferred Pharmacy (with phone number or street name): [facility]   Has the patient been seen for an appointment in the last year OR does the patient have an upcoming appointment? Yes.    Agent: Please be advised that RX refills may take up to 3 business days. We ask that you follow-up with your pharmacy.   Pt has been without sensor for a day and a half

## 2023-04-19 ENCOUNTER — Other Ambulatory Visit: Payer: Self-pay

## 2023-04-19 MED ORDER — FREESTYLE LIBRE 2 READER DEVI
0 refills | Status: DC
Start: 2023-04-19 — End: 2023-06-29
  Filled 2023-04-19: qty 1, fill #0
  Filled 2023-04-20: qty 1, 90d supply, fill #0
  Filled 2023-04-25: qty 1, 30d supply, fill #0
  Filled 2023-04-25: qty 1, fill #0
  Filled 2023-04-29: qty 1, 1d supply, fill #0
  Filled 2023-05-20: qty 1, fill #0
  Filled 2023-06-01 – 2023-06-24 (×3): qty 1, 30d supply, fill #0
  Filled 2023-06-28 – 2023-06-29 (×3): qty 1, fill #0

## 2023-04-19 NOTE — Telephone Encounter (Signed)
Requested Prescriptions  Pending Prescriptions Disp Refills   Continuous Glucose Receiver (FREESTYLE LIBRE 2 READER) DEVI 1 each 0    Sig: Use to check blood sugar three times daily.     Endocrinology: Diabetes - Testing Supplies Passed - 04/18/2023  4:05 AM      Passed - Valid encounter within last 12 months    Recent Outpatient Visits           1 month ago Type 1 diabetes mellitus with hyperglycemia (HCC)   [facility] Hoy Register, MD   3 months ago Sciatica, right side   [place] [facility], Odette Horns, MD   4 months ago Type 1 diabetes mellitus with hyperglycemia [facility])   [place] [facility] [place], Odette Horns, MD   8 months ago Type 1 diabetes mellitus with hyperglycemia [facility])   [place] [facility] Hoy Register, MD   11 months ago Annual physical exam   [place] [facility] Hoy Register, MD       Future Appointments             In 1 month Hoy Register, MD [facility]   In 1 month Shamleffer, Konrad Dolores, MD [facility] Endocrinology

## 2023-04-20 ENCOUNTER — Other Ambulatory Visit: Payer: Self-pay

## 2023-04-24 ENCOUNTER — Encounter: Payer: Self-pay | Admitting: Family Medicine

## 2023-04-25 ENCOUNTER — Other Ambulatory Visit: Payer: Self-pay

## 2023-04-25 ENCOUNTER — Other Ambulatory Visit: Payer: Self-pay | Admitting: Family Medicine

## 2023-04-25 MED ORDER — IBUPROFEN 400 MG PO TABS
400.0000 mg | ORAL_TABLET | Freq: Four times a day (QID) | ORAL | 1 refills | Status: DC | PRN
  Filled 2023-04-25 – 2023-04-26 (×2): qty 60, 15d supply, fill #0
  Filled 2023-06-28: qty 60, 15d supply, fill #1

## 2023-04-26 ENCOUNTER — Other Ambulatory Visit: Payer: Self-pay

## 2023-04-27 ENCOUNTER — Other Ambulatory Visit: Payer: Self-pay

## 2023-04-27 ENCOUNTER — Telehealth: Payer: Self-pay

## 2023-04-27 NOTE — Telephone Encounter (Signed)
Pharmacy Patient Advocate Encounter   Received notification from CoverMyMeds that prior authorization for FREESTYLE LIBRE 2 SENSORS is required/requested.   Insurance verification completed.   The patient is insured through [facility] MEDICAID .   Per test claim: PA required; PA submitted to [facility] MEDICAID via [place] [employer]/confirmation #/EOC [id number] W Status is pending

## 2023-04-27 NOTE — Telephone Encounter (Signed)
Pharmacy Patient Advocate Encounter  Received notification from [facility] MEDICAID that Prior Authorization for FREESTYLE LIBRE 2 SENSORS has been APPROVED from 04/27/2023 to 10/24/2023   PA #/Case ID/Reference #: CONFIRMATION # [id number]

## 2023-04-30 ENCOUNTER — Other Ambulatory Visit: Payer: Self-pay

## 2023-05-02 ENCOUNTER — Other Ambulatory Visit: Payer: Self-pay | Admitting: Family Medicine

## 2023-05-02 ENCOUNTER — Other Ambulatory Visit: Payer: Self-pay

## 2023-05-02 ENCOUNTER — Encounter: Payer: Self-pay | Admitting: Family Medicine

## 2023-05-02 DIAGNOSIS — E1065 Type 1 diabetes mellitus with hyperglycemia: Secondary | ICD-10-CM

## 2023-05-02 MED ORDER — FREESTYLE LIBRE 2 SENSOR MISC
11 refills | Status: DC
Start: 2023-05-02 — End: 2023-06-05
  Filled 2023-05-02 – 2023-05-03 (×2): qty 2, fill #0
  Filled 2023-05-20 – 2023-05-23 (×2): qty 2, 28d supply, fill #0

## 2023-05-03 ENCOUNTER — Other Ambulatory Visit: Payer: Self-pay

## 2023-05-03 ENCOUNTER — Other Ambulatory Visit (HOSPITAL_COMMUNITY): Payer: Self-pay

## 2023-05-04 ENCOUNTER — Other Ambulatory Visit: Payer: Self-pay

## 2023-05-09 ENCOUNTER — Other Ambulatory Visit: Payer: Self-pay

## 2023-05-21 ENCOUNTER — Other Ambulatory Visit: Payer: Self-pay

## 2023-05-22 ENCOUNTER — Other Ambulatory Visit: Payer: Self-pay

## 2023-05-23 ENCOUNTER — Other Ambulatory Visit: Payer: Self-pay

## 2023-05-28 ENCOUNTER — Ambulatory Visit: Payer: Medicaid Other | Admitting: Dietician

## 2023-05-29 ENCOUNTER — Ambulatory Visit: Payer: Medicaid Other | Admitting: Family Medicine

## 2023-06-01 ENCOUNTER — Other Ambulatory Visit (HOSPITAL_COMMUNITY): Payer: Self-pay

## 2023-06-01 ENCOUNTER — Other Ambulatory Visit: Payer: Self-pay

## 2023-06-01 ENCOUNTER — Other Ambulatory Visit: Payer: Self-pay | Admitting: Family Medicine

## 2023-06-01 ENCOUNTER — Other Ambulatory Visit: Payer: Self-pay | Admitting: Pharmacist

## 2023-06-01 DIAGNOSIS — E1065 Type 1 diabetes mellitus with hyperglycemia: Secondary | ICD-10-CM

## 2023-06-01 MED ORDER — INSULIN LISPRO 100 UNIT/ML IJ SOLN
0.0000 [IU] | Freq: Three times a day (TID) | INTRAMUSCULAR | 3 refills | Status: DC
Start: 2023-06-01 — End: 2023-06-05
  Filled 2023-06-01: qty 30, 84d supply, fill #0

## 2023-06-05 ENCOUNTER — Other Ambulatory Visit: Payer: Self-pay

## 2023-06-05 ENCOUNTER — Encounter: Payer: Self-pay | Admitting: Internal Medicine

## 2023-06-05 ENCOUNTER — Ambulatory Visit: Payer: Medicaid Other | Admitting: Internal Medicine

## 2023-06-05 VITALS — BP 120/80 | HR 81 | Ht 67.0 in | Wt 133.0 lb

## 2023-06-05 DIAGNOSIS — E039 Hypothyroidism, unspecified: Secondary | ICD-10-CM

## 2023-06-05 DIAGNOSIS — E559 Vitamin D deficiency, unspecified: Secondary | ICD-10-CM

## 2023-06-05 DIAGNOSIS — N911 Secondary amenorrhea: Secondary | ICD-10-CM | POA: Diagnosis not present

## 2023-06-05 DIAGNOSIS — E1065 Type 1 diabetes mellitus with hyperglycemia: Secondary | ICD-10-CM

## 2023-06-05 DIAGNOSIS — E109 Type 1 diabetes mellitus without complications: Secondary | ICD-10-CM | POA: Insufficient documentation

## 2023-06-05 DIAGNOSIS — E1059 Type 1 diabetes mellitus with other circulatory complications: Secondary | ICD-10-CM

## 2023-06-05 MED ORDER — INSULIN LISPRO (1 UNIT DIAL) 100 UNIT/ML (KWIKPEN)
PEN_INJECTOR | SUBCUTANEOUS | 3 refills | Status: DC
  Filled 2023-06-05: qty 30, 84d supply, fill #0
  Filled 2023-09-10: qty 30, 84d supply, fill #1
  Filled 2023-10-13 – 2023-12-12 (×4): qty 30, 84d supply, fill #2
  Filled 2023-12-30 – 2024-02-29 (×3): qty 30, 84d supply, fill #3

## 2023-06-05 MED ORDER — DEXCOM G7 SENSOR MISC
1.0000 | 3 refills | Status: DC
  Filled 2023-06-05: qty 3, 30d supply, fill #0
  Filled 2023-08-05: qty 9, 90d supply, fill #0
  Filled 2023-09-12 – 2024-02-29 (×4): qty 9, 90d supply, fill #1

## 2023-06-05 MED ORDER — LANTUS SOLOSTAR 100 UNIT/ML ~~LOC~~ SOPN
32.0000 [IU] | PEN_INJECTOR | Freq: Every day | SUBCUTANEOUS | 4 refills | Status: AC
  Filled 2023-06-05: qty 9, 28d supply, fill #0
  Filled 2023-08-05: qty 9, 28d supply, fill #1
  Filled 2023-09-12: qty 9, 28d supply, fill #2
  Filled 2023-10-16 – 2023-11-14 (×2): qty 9, 28d supply, fill #3
  Filled 2023-12-12: qty 9, 28d supply, fill #4
  Filled 2023-12-30 – 2024-01-09 (×3): qty 9, 28d supply, fill #5

## 2023-06-05 MED ORDER — OMNIPOD 5 G7 INTRO (GEN 5) KIT
1.0000 | PACK | 0 refills | Status: DC
  Filled 2023-06-05 – 2023-06-24 (×3): qty 1, fill #0
  Filled 2023-07-04: qty 1, 30d supply, fill #0
  Filled 2023-07-05: qty 1, fill #0
  Filled 2023-08-05: qty 1, 1d supply, fill #0
  Filled 2023-09-12: qty 1, fill #0

## 2023-06-05 MED ORDER — OMNIPOD 5 G7 PODS (GEN 5) MISC
1.0000 | 3 refills | Status: DC
  Filled 2023-06-05 – 2023-07-04 (×3): qty 45, 90d supply, fill #0
  Filled 2023-08-05: qty 45, 45d supply, fill #0

## 2023-06-05 MED ORDER — INSULIN PEN NEEDLE 32G X 4 MM MISC
1.0000 | Freq: Four times a day (QID) | 3 refills | Status: AC
  Filled 2023-06-05: qty 100, 25d supply, fill #0
  Filled 2023-08-05: qty 400, 90d supply, fill #1
  Filled 2023-09-10: qty 400, 25d supply, fill #2
  Filled 2023-10-13 – 2023-11-14 (×2): qty 400, 90d supply, fill #2

## 2023-06-05 NOTE — Patient Instructions (Signed)
Take lantus 32 units once daily  Take Humalog 6 units with each meal  Humalog correctional insulin: ADD extra units on insulin to your meal-time Humalog dose if your blood sugars are higher than 170. Use the scale below to help guide you:   Blood sugar before meal Number of units to inject  Less than 170 0 unit  171 -  210 1 units  211 -  250 2 units  251 -  290 3 units  291 -  330 4 units  331 -  370 5 units  371 -  410 6 units    HOW TO TREAT LOW BLOOD SUGARS (Blood sugar LESS THAN 70 MG/DL) Please follow the RULE OF 15 for the treatment of hypoglycemia treatment (when your (blood sugars are less than 70 mg/dL)   STEP 1: Take 15 grams of carbohydrates when your blood sugar is low, which includes:  3-4 GLUCOSE TABS  OR 3-4 OZ OF JUICE OR REGULAR SODA OR ONE TUBE OF GLUCOSE GEL    STEP 2: RECHECK blood sugar in 15 MINUTES STEP 3: If your blood sugar is still low at the 15 minute recheck --> then, go back to STEP 1 and treat AGAIN with another 15 grams of carbohydrates.

## 2023-06-05 NOTE — Progress Notes (Unsigned)
Name: Madison Williams  MRN/ DOB: [id number], 01/16/1986   Age/ Sex: 37 y.o., female    PCP: Hoy Register, MD   Reason for Endocrinology Evaluation: Type 1 Diabetes Mellitus     Date of Initial Endocrinology Visit: 06/05/2023     PATIENT IDENTIFIER: Madison Williams is a 37 y.o. female with a past medical history of DM, CAD and Asthma . The patient presented for initial endocrinology clinic visit on 06/05/2023 for consultative assistance with her diabetes management.    HPI: [name] was    Diagnosed with DM at age 42 Prior Medications tried/Intolerance: Metformin- did not feel well on it.  Currently checking blood sugars multiple x / day, through [employer] 2 Hypoglycemia episodes : yes               Symptoms: yes                 Frequency: 3/ week Hemoglobin A1c has ranged from 9.8% in 2021, peaking at 11.5% in 2024.   In terms of diet, the patient eats 2 meals a day, snacks 1x a day. Avoids sugar sweetened beverages   Has occasional daily nausea but no recent vomiting Has minimal constipation but no diarrhea    Mother with thyroid disease  Maternal Great grandmother with RA   Menarche  14 yrs  LMP 3 yrs ago  Menstruations have always been irregular  Has noted acne and facial hirutism  She is not sexually active at this time  Has been pregnancy twice, 1 child 11 yrs   She has FH of breast ca    HOME DIABETES REGIMEN: Lantus 10 units QAM and 30-32 units at night  Humalog 2-6 units     Statin: yes ACE-I/ARB: no  CONTINUOUS GLUCOSE MONITORING RECORD INTERPRETATION    Dates of Recording: 10/23 - 06/05/2023  Sensor description: Freestyle libre 2  Results statistics:   CGM use % of time 89  Average and SD 229/35.6  Time in range    30    %  % Time Above 180 29  % Time above 250 40  % Time Below target 1   Glycemic patterns summary: BG's trend down overnight and increased throughout the day  Hyperglycemic episodes  postprandial  Hypoglycemic episodes occurred overnight  Overnight periods: Variable     DIABETIC COMPLICATIONS: Microvascular complications:   Denies: CKD Last eye exam: Completed   Macrovascular complications:  CAD Denies:  PVD, CVA   PAST HISTORY: Past Medical History:  Past Medical History:  Diagnosis Date   Asthma    Diabetes mellitus without complication (HCC)    Rheumatoid arthritis (HCC)    Past Surgical History:  Past Surgical History:  Procedure Laterality Date   LAPAROSCOPIC OVARIAN     LEFT HEART CATH AND CORONARY ANGIOGRAPHY N/A 11/24/2019   Procedure: LEFT HEART CATH AND CORONARY ANGIOGRAPHY;  Surgeon: Kathleene Hazel, MD;  Location: [facility];  Service: Cardiovascular;  Laterality: N/A;    Social History:  reports that she has quit smoking. Her smoking use included cigarettes. She has never used smokeless tobacco. She reports that she does not currently use alcohol. She reports current drug use. Family History:  Family History  Family history unknown: Yes     HOME MEDICATIONS: Allergies as of 06/05/2023   No Known Allergies      Medication List        Accurate as of June 05, 2023  2:11 PM. If you  have any questions, ask your nurse or doctor.          atorvastatin 20 MG tablet Commonly known as: LIPITOR Take 1 tablet (20 mg total) by mouth daily.   chlordiazePOXIDE 10 MG capsule Commonly known as: LIBRIUM Take by mouth.   clonazePAM 1 MG tablet Commonly known as: KLONOPIN Take 1 mg by mouth 2 (two) times daily as needed for anxiety.   cyclobenzaprine 5 MG tablet Commonly known as: FLEXERIL Take 1-2 tablets (5-10 mg total) by mouth 3 (three) times daily as needed for muscle spasms.   FLUoxetine 20 MG capsule Commonly known as: PROZAC Take 20 mg by mouth 2 (two) times daily.   FreeStyle Libre 2 Reader [place] Use to check blood sugar three times daily.   FreeStyle Libre 2 Sensor Misc Use to check blood sugar  three times daily.   gabapentin 300 MG capsule Commonly known as: NEURONTIN Take 2 capsules (600 mg total) by mouth 3 (three) times daily as needed.   HumaLOG 100 UNIT/ML injection Generic drug: insulin lispro Inject 0-0.12 mLs (0-12 Units total) into the skin 3 (three) times daily with meals. For blood sugars 0-150: 0 units, 151-200: 2 units , 201-250: 4 units, 251-300: 6 units, 301-350: 8 units, 351-400: 10 units,> 400: 12 units   ibuprofen 400 MG tablet Commonly known as: ADVIL Take 1 tablet (400 mg total) by mouth every 6 (six) hours as needed for moderate pain.   Lantus 100 UNIT/ML injection Generic drug: insulin glargine Inject 0.1 mLs (10 Units total) into the skin in the morning AND 0.3 mLs (30 Units total) at bedtime.   naproxen 500 MG tablet Commonly known as: NAPROSYN Take 1 tablet (500 mg total) by mouth 2 (two) times daily with a meal.   ondansetron 4 MG tablet Commonly known as: Zofran Take 1 tablet (4 mg total) by mouth every 8 (eight) hours as needed for nausea or vomiting.   promethazine 25 MG tablet Commonly known as: PHENERGAN Take 1 tablet (25 mg total) by mouth every 6 (six) hours as needed for nausea or vomiting.   propranolol 20 MG tablet Commonly known as: INDERAL Take 20-40 mg by mouth 2 (two) times daily as needed.   TRUEplus Insulin Syringe 31G X 5/16" 0.5 ML Misc Generic drug: Insulin Syringe-Needle U-100 Use to inject insulin daily.         ALLERGIES: No Known Allergies   REVIEW OF SYSTEMS: A comprehensive ROS was conducted with the patient and is negative except as per HPI     OBJECTIVE:   VITAL SIGNS: BP 120/80 (BP Location: Left Arm, Patient Position: Sitting, Cuff Size: Small)   Pulse 81   Ht 5\' 7"  (1.702 m)   Wt 133 lb (60.3 kg)   LMP 03/16/2018 Comment: negative urine preg test 01/14/20  SpO2 99%   BMI 20.83 kg/m    PHYSICAL EXAM:  General: Pt appears well and is in NAD  Neck: General: Supple without adenopathy or  carotid bruits. Thyroid: Thyroid size normal.  No goiter or nodules appreciated.   Lungs: Clear with good BS bilat   Heart: RRR   Abdomen:  soft, nontender  Extremities:  Lower extremities - No pretibial edema.   Neuro: MS is good with appropriate affect, pt is alert and Ox3    DM foot exam: 06/05/2023  The skin of the feet is intact without sores or ulcerations. The pedal pulses are 2+ on right and 2+ on left. The sensation is intact to  a screening 5.07, 10 gram monofilament bilaterally   DATA REVIEWED:  Lab Results  Component Value Date   HGBA1C 10.0 (A) 02/26/2023   HGBA1C 10.5 (A) 11/21/2022   HGBA1C 11.5 (A) 08/15/2022    Latest Reference Range & Units 06/05/23 14:48  Sodium 135 - 145 mEq/L 138  Potassium 3.5 - 5.1 mEq/L 4.1  Chloride 96 - 112 mEq/L 97  CO2 19 - 32 mEq/L 32  Glucose 70 - 99 mg/dL 97  BUN 6 - 23 mg/dL 6  Creatinine 5.28 - 4.13 mg/dL 2.44  Calcium 8.4 - 01.0 mg/dL 8.1 (L)  Alkaline Phosphatase 39 - 117 U/L 77  Albumin 3.5 - 5.2 g/dL 4.1  AST 0 - 37 U/L 33  ALT 0 - 35 U/L 41 (H)  Total Protein 6.0 - 8.3 g/dL 6.8  Total Bilirubin 0.2 - 1.2 mg/dL 0.4  GFR >27.25 mL/min 96.17    Latest Reference Range & Units 06/05/23 14:48  VITD 30.00 - 100.00 ng/mL 9.53 (L)     Latest Reference Range & Units 06/05/23 14:48  LH mIU/mL 0.35  FSH mIU/ML 3.5  Prolactin ng/mL 22.6  Glucose 70 - 99 mg/dL 97  Estradiol pg/mL 18     Latest Reference Range & Units 06/05/23 14:48  TSH 0.35 - 5.50 uIU/mL 16.36 (H)  T4,Free(Direct) 0.60 - 1.60 ng/dL 3.66 (L)  (H): Data is abnormally high (L): Data is abnormally low    Latest Reference Range & Units 02/26/23 14:26  Sodium 134 - 144 mmol/L 141  Potassium 3.5 - 5.2 mmol/L 4.0  Chloride 96 - 106 mmol/L 98  CO2 20 - 29 mmol/L 27  Glucose 70 - 99 mg/dL 67 (L)  BUN 6 - 20 mg/dL 4 (L)  Creatinine 4.40 - 1.00 mg/dL 3.47  Calcium 8.7 - 42.5 mg/dL 8.6 (L)  BUN/Creatinine Ratio 9 - 23  5 (L)  eGFR >59 mL/min/1.73 94   Alkaline Phosphatase 44 - 121 IU/L 92  Albumin 3.9 - 4.9 g/dL 4.2  (L): Data is abnormally low       ASSESSMENT / PLAN / RECOMMENDATIONS:   1) Type 1 Diabetes Mellitus, Poorly controlled, With neuropathic and macrovascular  complications - Most recent A1c of 9.7 %. Goal A1c < 7.0 %.    Plan: GENERAL: I have discussed with the patient the pathophysiology of diabetes. We went over the natural progression of the disease. We stressed the importance of lifestyle changes. I explained the complications associated with diabetes including retinopathy, nephropathy, neuropathy as well as increased risk of cardiovascular disease. We went over the benefit seen with glycemic control.   I explained to the patient that diabetic patients are at higher than normal risk for amputations.  Patient has been noted with hypoglycemia overnight in reviewing CGM, will decrease Lantus as below Emphasized the importance of taking prandial insulin before each meal, she will also be provided with a correction scale to be used for Premeal glucose readings I have also recommended OmniPod, which she is in agreement of, a prescription was sent to the pharmacy and a referral has been placed to our CDE  MEDICATIONS: Taking Lantus 32 units once daily Take Humalog 6 units 3 times daily before every meal Start correction factor: Humalog (BG -130/40) TIDQAC  EDUCATION / INSTRUCTIONS: BG monitoring instructions: Patient is instructed to check her blood sugars 3 times a day. Call [place] [facility] if: BG persistently < 70  I reviewed the Rule of 15 for the treatment of hypoglycemia in detail  with the patient. Literature supplied.   2) Diabetic complications:  Eye: Does not have known diabetic retinopathy.  Neuro/ Feet: Does not have known diabetic peripheral neuropathy. Renal: Patient does not have known baseline CKD. She is not on an ACEI/ARB at present.  3) Secondary Amenorrhea :   -Patient concerned about  PCOS -Has family history of breast cancer as well as endometriosis -FSH, LH, estradiol, prolactin are normal -Testosterone pending   4) Hypothyroidism:  -This is new diagnosis -Suspect the reason for secondary amenorrhea -Will treat   Medication Start levothyroxine 50 mcg daily   5) Vitamin D deficiency:   Medication  Ergocalciferol 50,000 Iuweekly    Follow-up in 3 months  Signed electronically by: Lyndle Herrlich, MD  [facility]  [facility] [address], [address] [place], [place] [address] Phone: [phone] FAX: [phone]   CC: Hoy Register, MD [address] [place] [address] Phone: [phone]  Fax: [phone]    Return to Endocrinology clinic as below: Future Appointments  Date Time Provider Department Center  07/16/2023  1:30 PM Hoy Register, MD [id number] None

## 2023-06-06 DIAGNOSIS — E039 Hypothyroidism, unspecified: Secondary | ICD-10-CM | POA: Insufficient documentation

## 2023-06-06 DIAGNOSIS — E559 Vitamin D deficiency, unspecified: Secondary | ICD-10-CM | POA: Insufficient documentation

## 2023-06-06 DIAGNOSIS — N911 Secondary amenorrhea: Secondary | ICD-10-CM | POA: Insufficient documentation

## 2023-06-06 LAB — COMPREHENSIVE METABOLIC PANEL
ALT: 41 U/L — ABNORMAL HIGH (ref 0–35)
AST: 33 U/L (ref 0–37)
Albumin: 4.1 g/dL (ref 3.5–5.2)
Alkaline Phosphatase: 77 U/L (ref 39–117)
BUN: 6 mg/dL (ref 6–23)
CO2: 32 meq/L (ref 19–32)
Calcium: 8.1 mg/dL — ABNORMAL LOW (ref 8.4–10.5)
Chloride: 97 meq/L (ref 96–112)
Creatinine, Ser: 0.79 mg/dL (ref 0.40–1.20)
GFR: 96.17 mL/min (ref >60.00)
Glucose, Bld: 97 mg/dL (ref 70–99)
Potassium: 4.1 meq/L (ref 3.5–5.1)
Sodium: 138 meq/L (ref 135–145)
Total Bilirubin: 0.4 mg/dL (ref 0.2–1.2)
Total Protein: 6.8 g/dL (ref 6.0–8.3)

## 2023-06-06 LAB — VITAMIN D 25 HYDROXY (VIT D DEFICIENCY, FRACTURES): VITD: 9.53 ng/mL — ABNORMAL LOW (ref 30.00–100.00)

## 2023-06-06 LAB — FOLLICLE STIMULATING HORMONE: FSH: 3.5 m[IU]/mL

## 2023-06-06 LAB — LUTEINIZING HORMONE: LH: 0.35 m[IU]/mL

## 2023-06-06 LAB — TSH: TSH: 16.36 u[IU]/mL — ABNORMAL HIGH (ref 0.35–5.50)

## 2023-06-06 LAB — T4, FREE: Free T4: 0.58 ng/dL — ABNORMAL LOW (ref 0.60–1.60)

## 2023-06-07 ENCOUNTER — Other Ambulatory Visit: Payer: Self-pay

## 2023-06-07 LAB — POCT GLYCOSYLATED HEMOGLOBIN (HGB A1C): Hemoglobin A1C: 9.7 % — AB (ref 4.0–5.6)

## 2023-06-07 MED ORDER — VITAMIN D (ERGOCALCIFEROL) 1.25 MG (50000 UNIT) PO CAPS
50000.0000 [IU] | ORAL_CAPSULE | ORAL | 2 refills | Status: DC
  Filled 2023-06-07: qty 12, 84d supply, fill #0

## 2023-06-07 MED ORDER — LEVOTHYROXINE SODIUM 50 MCG PO TABS
50.0000 ug | ORAL_TABLET | Freq: Every day | ORAL | 3 refills | Status: AC
  Filled 2023-06-07: qty 90, 90d supply, fill #0
  Filled 2023-09-12: qty 90, 90d supply, fill #1
  Filled 2024-03-05: qty 90, 90d supply, fill #2

## 2023-06-08 LAB — TESTOSTERONE, TOTAL, LC/MS/MS: Testosterone, Total, LC-MS-MS: 10 ng/dL (ref 2–45)

## 2023-06-08 LAB — ESTRADIOL: Estradiol: 18 pg/mL

## 2023-06-08 LAB — PROLACTIN: Prolactin: 22.6 ng/mL

## 2023-06-12 ENCOUNTER — Other Ambulatory Visit: Payer: Self-pay

## 2023-06-15 ENCOUNTER — Other Ambulatory Visit: Payer: Self-pay

## 2023-06-18 ENCOUNTER — Other Ambulatory Visit: Payer: Self-pay

## 2023-06-19 ENCOUNTER — Other Ambulatory Visit: Payer: Self-pay

## 2023-06-20 ENCOUNTER — Other Ambulatory Visit (HOSPITAL_COMMUNITY): Payer: Self-pay

## 2023-06-20 ENCOUNTER — Telehealth: Payer: Self-pay

## 2023-06-20 NOTE — Telephone Encounter (Signed)
Pharmacy Patient Advocate Encounter   Received notification from CoverMyMeds that prior authorization for Dexcom G7 Sensor is required/requested.   Insurance verification completed.   The patient is insured through [facility] MEDICAID .   Per test claim: PA required; PA submitted to above mentioned insurance via [facility] Tracks Key/confirmation #/EOC [id number] W Status is pending

## 2023-06-21 ENCOUNTER — Other Ambulatory Visit: Payer: Self-pay

## 2023-06-25 ENCOUNTER — Other Ambulatory Visit: Payer: Self-pay

## 2023-06-25 ENCOUNTER — Ambulatory Visit: Payer: Medicaid Other | Admitting: Family Medicine

## 2023-06-28 ENCOUNTER — Other Ambulatory Visit: Payer: Self-pay | Admitting: Family Medicine

## 2023-06-28 DIAGNOSIS — M5431 Sciatica, right side: Secondary | ICD-10-CM

## 2023-06-29 ENCOUNTER — Encounter: Payer: Self-pay | Admitting: Nurse Practitioner

## 2023-06-29 ENCOUNTER — Other Ambulatory Visit: Payer: Self-pay

## 2023-06-29 ENCOUNTER — Other Ambulatory Visit: Payer: Self-pay | Admitting: Nurse Practitioner

## 2023-06-29 ENCOUNTER — Encounter: Payer: Self-pay | Admitting: Family Medicine

## 2023-06-29 DIAGNOSIS — M5431 Sciatica, right side: Secondary | ICD-10-CM

## 2023-06-29 MED ORDER — GABAPENTIN 300 MG PO CAPS
600.0000 mg | ORAL_CAPSULE | Freq: Three times a day (TID) | ORAL | 0 refills | Status: DC | PRN
  Filled 2023-06-29: qty 180, 30d supply, fill #0

## 2023-07-02 NOTE — Telephone Encounter (Signed)
Pharmacy Patient Advocate Encounter  Received notification from [facility] MEDICAID that Prior Authorization for Dexcom G7 Sensor has been APPROVED from 06-20-2023 to 12-17-2023   PA #/Case ID/Reference #: [id number] W

## 2023-07-04 ENCOUNTER — Other Ambulatory Visit: Payer: Self-pay

## 2023-07-05 ENCOUNTER — Encounter: Payer: Self-pay | Admitting: Internal Medicine

## 2023-07-05 ENCOUNTER — Other Ambulatory Visit: Payer: Self-pay

## 2023-07-06 ENCOUNTER — Other Ambulatory Visit: Payer: Self-pay

## 2023-07-06 MED ORDER — FREESTYLE LIBRE 3 PLUS SENSOR MISC
3 refills | Status: DC
  Filled 2023-07-06 – 2023-09-12 (×3): qty 2, 30d supply, fill #0
  Filled 2023-10-03 – 2023-10-16 (×2): qty 6, 90d supply, fill #0
  Filled 2023-11-14 – 2023-11-30 (×2): qty 6, 84d supply, fill #0

## 2023-07-06 MED ORDER — FREESTYLE LIBRE 2 SENSOR MISC
3 refills | Status: DC
  Filled 2023-07-06: qty 2, 28d supply, fill #0
  Filled 2023-07-17 – 2023-08-05 (×2): qty 2, 28d supply, fill #1
  Filled 2023-08-28: qty 2, 28d supply, fill #2
  Filled 2023-09-12: qty 2, 28d supply, fill #3

## 2023-07-12 ENCOUNTER — Ambulatory Visit: Payer: Medicaid Other | Admitting: Family Medicine

## 2023-07-16 ENCOUNTER — Ambulatory Visit: Payer: Medicaid Other | Admitting: Family Medicine

## 2023-07-17 ENCOUNTER — Other Ambulatory Visit: Payer: Self-pay

## 2023-07-17 ENCOUNTER — Other Ambulatory Visit: Payer: Self-pay | Admitting: Family Medicine

## 2023-07-17 MED ORDER — IBUPROFEN 400 MG PO TABS
400.0000 mg | ORAL_TABLET | Freq: Four times a day (QID) | ORAL | 1 refills | Status: DC | PRN
  Filled 2023-07-17 – 2023-09-10 (×2): qty 60, 15d supply, fill #0

## 2023-07-18 ENCOUNTER — Other Ambulatory Visit: Payer: Self-pay

## 2023-07-19 ENCOUNTER — Other Ambulatory Visit: Payer: Self-pay

## 2023-07-30 ENCOUNTER — Other Ambulatory Visit: Payer: Self-pay

## 2023-08-06 ENCOUNTER — Other Ambulatory Visit: Payer: Self-pay

## 2023-08-07 ENCOUNTER — Other Ambulatory Visit: Payer: Self-pay

## 2023-08-08 ENCOUNTER — Other Ambulatory Visit: Payer: Self-pay

## 2023-08-10 ENCOUNTER — Other Ambulatory Visit: Payer: Self-pay | Admitting: Nurse Practitioner

## 2023-08-10 DIAGNOSIS — M5431 Sciatica, right side: Secondary | ICD-10-CM

## 2023-08-13 ENCOUNTER — Other Ambulatory Visit: Payer: Self-pay

## 2023-08-13 MED ORDER — GABAPENTIN 300 MG PO CAPS
600.0000 mg | ORAL_CAPSULE | Freq: Three times a day (TID) | ORAL | 0 refills | Status: DC | PRN
  Filled 2023-08-13: qty 180, 30d supply, fill #0

## 2023-08-14 ENCOUNTER — Other Ambulatory Visit: Payer: Self-pay

## 2023-08-15 ENCOUNTER — Other Ambulatory Visit: Payer: Self-pay

## 2023-09-04 ENCOUNTER — Other Ambulatory Visit: Payer: Self-pay

## 2023-09-10 ENCOUNTER — Other Ambulatory Visit: Payer: Self-pay

## 2023-09-10 ENCOUNTER — Other Ambulatory Visit: Payer: Self-pay | Admitting: Family Medicine

## 2023-09-10 DIAGNOSIS — M5431 Sciatica, right side: Secondary | ICD-10-CM

## 2023-09-11 ENCOUNTER — Encounter: Payer: Self-pay | Admitting: Family Medicine

## 2023-09-11 ENCOUNTER — Other Ambulatory Visit: Payer: Self-pay | Admitting: Family Medicine

## 2023-09-11 ENCOUNTER — Other Ambulatory Visit: Payer: Self-pay

## 2023-09-11 ENCOUNTER — Telehealth (HOSPITAL_BASED_OUTPATIENT_CLINIC_OR_DEPARTMENT_OTHER): Payer: Medicaid Other | Admitting: Family Medicine

## 2023-09-11 DIAGNOSIS — R112 Nausea with vomiting, unspecified: Secondary | ICD-10-CM | POA: Diagnosis not present

## 2023-09-11 DIAGNOSIS — Z029 Encounter for administrative examinations, unspecified: Secondary | ICD-10-CM | POA: Diagnosis not present

## 2023-09-11 DIAGNOSIS — K3184 Gastroparesis: Secondary | ICD-10-CM

## 2023-09-11 MED ORDER — GABAPENTIN 300 MG PO CAPS
600.0000 mg | ORAL_CAPSULE | Freq: Three times a day (TID) | ORAL | 0 refills | Status: DC | PRN
  Filled 2023-09-11: qty 180, 30d supply, fill #0

## 2023-09-11 NOTE — Progress Notes (Signed)
Virtual Visit via Video Note  I connected with Madison Williams, on 09/11/2023 at 8:15 AM by video enabled telemedicine device and verified that I am speaking with the correct person using two identifiers.   Consent: I discussed the limitations, risks, security and privacy concerns of performing an evaluation and management service by telemedicine and the availability of in person appointments. I also discussed with the patient that there may be a patient responsible charge related to this service. The patient expressed understanding and agreed to proceed.   Location of Patient: Work  Government social research officer of Provider: Clinic   Persons participating in Telemedicine visit: Madison Williams Dr. Alvis Lemmings     History of Present Illness: Madison Williams is a 38 y.o. year old female  with a history of hypertension, type 1 diabetes mellitus diagnosed in 2017 (A1c 9.7), rheumatoid arthritis, alcohol abuse, anxiety.    Discussed the use of AI scribe software for clinical note transcription with the patient, who gave verbal consent to proceed.   The patient, with a history of gastroparesis, presents for completion of an FMLA form for intermittent leave due to an acute episode of illness. She reports experiencing 'extreme nausea and vomiting,' 'muscle soreness,' and 'exhaustion' on January 25th, which resulted in missed work. She self-managed her symptoms at home, utilizing Zofran for nausea control. The patient's symptoms were severe enough to prevent her from performing her job functions on that day.      Past Medical History:  Diagnosis Date   Asthma    Diabetes mellitus without complication (HCC)    Rheumatoid arthritis (HCC)    No Known Allergies  Current Outpatient Medications on File Prior to Visit  Medication Sig Dispense Refill   Continuous Glucose Sensor (FREESTYLE LIBRE 2 SENSOR) MISC Change every 14 days 6 each 3   atorvastatin (LIPITOR) 20 MG tablet Take 1 tablet (20  mg total) by mouth daily. 30 tablet 3   chlordiazePOXIDE (LIBRIUM) 10 MG capsule Take by mouth.     clonazePAM (KLONOPIN) 1 MG tablet Take 1 mg by mouth 2 (two) times daily as needed for anxiety.     Continuous Glucose Sensor (DEXCOM G7 SENSOR) MISC Change every 10 days. 9 each 3   Continuous Glucose Sensor (FREESTYLE LIBRE 3 PLUS SENSOR) MISC Change sensor every 15 days. 6 each 3   cyclobenzaprine (FLEXERIL) 5 MG tablet Take 1-2 tablets (5-10 mg total) by mouth 3 (three) times daily as needed for muscle spasms. 30 tablet 1   FLUoxetine (PROZAC) 20 MG capsule Take 20 mg by mouth 2 (two) times daily.     gabapentin (NEURONTIN) 300 MG capsule Take 2 capsules (600 mg total) by mouth 3 (three) times daily as needed. 180 capsule 0   ibuprofen (ADVIL) 400 MG tablet Take 1 tablet (400 mg total) by mouth every 6 (six) hours as needed for moderate pain. 60 tablet 1   Insulin Disposable Pump (OMNIPOD 5 G7 INTRO, GEN 5,) KIT Apply route every other day. 1 kit 0   Insulin Disposable Pump (OMNIPOD 5 G7 PODS, GEN 5,) MISC use every other day. 45 each 3   insulin glargine (LANTUS SOLOSTAR) 100 UNIT/ML Solostar Pen Inject 32 Units into the skin daily. 30 mL 4   insulin lispro (HUMALOG KWIKPEN) 100 UNIT/ML KwikPen Max daily 30 units 30 mL 3   Insulin Pen Needle 32G X 4 MM MISC USE AS DIRECTED in the morning, at noon, in the evening, and at bedtime. 400 each 3  levothyroxine (SYNTHROID) 50 MCG tablet Take 1 tablet (50 mcg total) by mouth daily. 90 tablet 3   naproxen (NAPROSYN) 500 MG tablet Take 1 tablet (500 mg total) by mouth 2 (two) times daily with a meal. 60 tablet 1   ondansetron (ZOFRAN) 4 MG tablet Take 1 tablet (4 mg total) by mouth every 8 (eight) hours as needed for nausea or vomiting. 20 tablet 0   promethazine (PHENERGAN) 25 MG tablet Take 1 tablet (25 mg total) by mouth every 6 (six) hours as needed for nausea or vomiting. 30 tablet 0   propranolol (INDERAL) 20 MG tablet Take 20-40 mg by mouth 2  (two) times daily as needed.     Vitamin D, Ergocalciferol, (DRISDOL) 1.25 MG (50000 UNIT) CAPS capsule Take 1 capsule (50,000 Units total) by mouth every 7 (seven) days. 13 capsule 2   No current facility-administered medications on file prior to visit.    ROS: See HPI  Observations/Objective: Awake, alert, oriented x3 Not in acute distress Normal mood      Latest Ref Rng & Units 06/05/2023    2:48 PM 02/26/2023    2:26 PM 08/15/2022   10:10 AM  CMP  Glucose 70 - 99 mg/dL 97  67  92   BUN 6 - 23 mg/dL 6  4  8    Creatinine 0.40 - 1.20 mg/dL 0.45  4.09  8.11   Sodium 135 - 145 mEq/L 138  141  138   Potassium 3.5 - 5.1 mEq/L 4.1  4.0  4.2   Chloride 96 - 112 mEq/L 97  98  94   CO2 19 - 32 mEq/L 32  27  28   Calcium 8.4 - 10.5 mg/dL 8.1  8.6  9.4   Total Protein 6.0 - 8.3 g/dL 6.8  6.9  7.3   Total Bilirubin 0.2 - 1.2 mg/dL 0.4  0.2  <9.1   Alkaline Phos 39 - 117 U/L 77  92  97   AST 0 - 37 U/L 33  29  21   ALT 0 - 35 U/L 41  25  23     Lipid Panel     Component Value Date/Time   CHOL 278 (H) 08/15/2022 1010   TRIG 113 08/15/2022 1010   HDL 131 08/15/2022 1010   CHOLHDL 2.0 02/05/2020 1152   CHOLHDL 2.1 11/25/2019 0920   VLDL 16 11/25/2019 0920   LDLCALC 129 (H) 08/15/2022 1010   LABVLDL 18 08/15/2022 1010    Lab Results  Component Value Date   HGBA1C 9.7 (A) 06/07/2023     Assessment and Plan:     Acute Gastroenteritis or Gastroparesis Flare Patient reported severe nausea, vomiting, muscle soreness, and exhaustion on 08/25/2023 leading to missed work. Self-managed with Zofran. -Continue conservative treatment with Zofran as needed. -Next appointment scheduled for 09/18/2023.  FMLA Paperwork Patient requires completion of FMLA paperwork for intermittent leave due to acute illness episode. -Complete FMLA paperwork indicating patient's inability to perform essential job functions during acute illness episode. -Her form is missing the employee portion which  includes the signature. Discuss with patient the best method to deliver the completed form (fax, or pick-up).        Follow Up Instructions: Keep upcoming appointment next week.   I discussed the assessment and treatment plan with the patient. The patient was provided an opportunity to ask questions and all were answered. The patient agreed with the plan and demonstrated an understanding of the instructions.   The  patient was advised to call back or seek an in-person evaluation if the symptoms worsen or if the condition fails to improve as anticipated.     I provided 12 minutes total of Telehealth time during this encounter including median intraservice time, reviewing previous notes, investigations, ordering medications, medical decision making, coordinating care and patient verbalized understanding at the end of the visit.     Hoy Register, MD, FAAFP. [facility] [place], [place] [phone]   09/11/2023, 8:15 AM

## 2023-09-12 ENCOUNTER — Other Ambulatory Visit: Payer: Self-pay

## 2023-09-12 ENCOUNTER — Telehealth: Payer: Self-pay

## 2023-09-12 ENCOUNTER — Other Ambulatory Visit (HOSPITAL_COMMUNITY): Payer: Self-pay

## 2023-09-12 NOTE — Telephone Encounter (Signed)
Patient was called and informed that paperwork has been faxed to employer and copies are ready for pick up.

## 2023-09-18 ENCOUNTER — Encounter: Payer: Self-pay | Admitting: Family Medicine

## 2023-09-18 ENCOUNTER — Ambulatory Visit: Payer: Medicaid Other | Attending: Family Medicine | Admitting: Family Medicine

## 2023-09-18 ENCOUNTER — Ambulatory Visit: Payer: Medicaid Other | Admitting: Internal Medicine

## 2023-09-18 ENCOUNTER — Encounter: Payer: Self-pay | Admitting: Internal Medicine

## 2023-09-18 ENCOUNTER — Other Ambulatory Visit: Payer: Self-pay

## 2023-09-18 VITALS — BP 96/62 | HR 79 | Ht 67.0 in | Wt 128.6 lb

## 2023-09-18 VITALS — BP 112/72 | HR 69 | Ht 67.0 in | Wt 128.6 lb

## 2023-09-18 DIAGNOSIS — M79673 Pain in unspecified foot: Secondary | ICD-10-CM | POA: Insufficient documentation

## 2023-09-18 DIAGNOSIS — M069 Rheumatoid arthritis, unspecified: Secondary | ICD-10-CM | POA: Diagnosis not present

## 2023-09-18 DIAGNOSIS — E1065 Type 1 diabetes mellitus with hyperglycemia: Secondary | ICD-10-CM

## 2023-09-18 DIAGNOSIS — E785 Hyperlipidemia, unspecified: Secondary | ICD-10-CM | POA: Insufficient documentation

## 2023-09-18 DIAGNOSIS — E1059 Type 1 diabetes mellitus with other circulatory complications: Secondary | ICD-10-CM | POA: Diagnosis not present

## 2023-09-18 DIAGNOSIS — M5431 Sciatica, right side: Secondary | ICD-10-CM

## 2023-09-18 DIAGNOSIS — F32A Depression, unspecified: Secondary | ICD-10-CM | POA: Diagnosis not present

## 2023-09-18 DIAGNOSIS — E039 Hypothyroidism, unspecified: Secondary | ICD-10-CM

## 2023-09-18 DIAGNOSIS — L853 Xerosis cutis: Secondary | ICD-10-CM

## 2023-09-18 DIAGNOSIS — Z794 Long term (current) use of insulin: Secondary | ICD-10-CM

## 2023-09-18 DIAGNOSIS — I10 Essential (primary) hypertension: Secondary | ICD-10-CM | POA: Diagnosis not present

## 2023-09-18 DIAGNOSIS — M79672 Pain in left foot: Secondary | ICD-10-CM

## 2023-09-18 DIAGNOSIS — Z79899 Other long term (current) drug therapy: Secondary | ICD-10-CM | POA: Insufficient documentation

## 2023-09-18 DIAGNOSIS — N911 Secondary amenorrhea: Secondary | ICD-10-CM | POA: Diagnosis not present

## 2023-09-18 DIAGNOSIS — F419 Anxiety disorder, unspecified: Secondary | ICD-10-CM

## 2023-09-18 DIAGNOSIS — Z7182 Exercise counseling: Secondary | ICD-10-CM | POA: Diagnosis not present

## 2023-09-18 DIAGNOSIS — E109 Type 1 diabetes mellitus without complications: Secondary | ICD-10-CM | POA: Insufficient documentation

## 2023-09-18 DIAGNOSIS — E1169 Type 2 diabetes mellitus with other specified complication: Secondary | ICD-10-CM

## 2023-09-18 LAB — POCT GLYCOSYLATED HEMOGLOBIN (HGB A1C): Hemoglobin A1C: 10.2 % — AB (ref 4.0–5.6)

## 2023-09-18 MED ORDER — OMNIPOD 5 G7 PODS (GEN 5) MISC: 1.0000 | 3 refills | Status: AC

## 2023-09-18 MED ORDER — GABAPENTIN 300 MG PO CAPS
600.0000 mg | ORAL_CAPSULE | Freq: Three times a day (TID) | ORAL | 6 refills | Status: DC | PRN
  Filled 2023-09-18 – 2023-10-24 (×3): qty 180, 30d supply, fill #0
  Filled 2023-11-14 – 2023-11-16 (×6): qty 180, 30d supply, fill #1
  Filled 2023-11-30 – 2023-12-08 (×3): qty 180, 30d supply, fill #2
  Filled 2023-12-30 – 2024-01-01 (×2): qty 180, 30d supply, fill #3

## 2023-09-18 MED ORDER — OMNIPOD 5 G7 INTRO (GEN 5) KIT: 1.0000 | PACK | 0 refills | Status: AC

## 2023-09-18 MED ORDER — DEXCOM G7 SENSOR MISC: 1.0000 | 3 refills | Status: AC

## 2023-09-18 MED ORDER — ATORVASTATIN CALCIUM 20 MG PO TABS
20.0000 mg | ORAL_TABLET | Freq: Every day | ORAL | 1 refills | Status: DC
  Filled 2023-09-18: qty 90, 90d supply, fill #0

## 2023-09-18 NOTE — Progress Notes (Signed)
 Name: Madison Williams  MRN/ DOB: [id number], Apr 15, 1986   Age/ Sex: 38 y.o., female    PCP: Hoy Register, MD   Reason for Endocrinology Evaluation: Type 1 Diabetes Mellitus     Date of Initial Endocrinology Visit: 06/05/2023     PATIENT IDENTIFIER: Madison Williams is a 38 y.o. female with a past medical history of DM, CAD and Asthma . The patient presented for initial endocrinology clinic visit on 06/05/2023  for consultative assistance with her diabetes management.    HPI: [name] was    Diagnosed with DM at age 7 Prior Medications tried/Intolerance: Metformin- did not feel well on it.  Hemoglobin A1c has ranged from 9.8% in 2021, peaking at 11.5% in 2024   On her initial visit to our clinic she had an A1c of 9.7%, she was on MDI regimen, which I continued, provided with a correction scale and prescribed Dexcom and OmniPod     THYROID HISTORY: Patient has been noted with an elevated TSH of 16.36 u IU/mL on her initial visit to our clinic 06/2023, she was started on levothyroxine  She was having irregular menstruations at the time  Mother with thyroid disease  Maternal Great grandmother with RA     SECONDARY AMENORRHEA: On her initial visit to our clinic she has not had menstruation for approximately 3 years.  Prior to that she had irregular menstruations.  She did endorse acne and facial hirsutism, but prolactin, and testosterone were normal.   She did have elevated TSH of 16.36 u IU/mL at the time Estradiol level was low at 18 (reference follicular phase 19-1 44) with inappropriately normal FSH of 3.58mIU/mL, and low LH 0.35 mIU/mL  Has been  pregnant twice, 1 child 11 yrs    She has FH of breast ca   SUBJECTIVE:   During the last visit (06/05/2023): A1c 9.7%  Today (09/18/23): [name] is here  for diabetes management. She checks  blood sugars multiple times daily. The patient has  had hypoglycemic episodes since the last  clinic visit.   Denies nausea or vomiting today  Denies constipation or constipation    HOME DIABETES REGIMEN: Lantus 32 units daily - between 28-32 units  Humalog 6 units  TIDQAC- 3-6 units  CF: Humalog (BG-130/40) TIDQAC Levothyroxine 50 mcg daily - not taking    Statin: yes ACE-I/ARB: no  CONTINUOUS GLUCOSE MONITORING RECORD INTERPRETATION: Unable to download       DIABETIC COMPLICATIONS: Microvascular complications:   Denies: CKD Last eye exam: Completed   Macrovascular complications:  CAD Denies:  PVD, CVA   PAST HISTORY: Past Medical History:  Past Medical History:  Diagnosis Date   Asthma    Diabetes mellitus without complication (HCC)    Rheumatoid arthritis (HCC)    Past Surgical History:  Past Surgical History:  Procedure Laterality Date   LAPAROSCOPIC OVARIAN     LEFT HEART CATH AND CORONARY ANGIOGRAPHY N/A 11/24/2019   Procedure: LEFT HEART CATH AND CORONARY ANGIOGRAPHY;  Surgeon: Kathleene Hazel, MD;  Location: [facility];  Service: Cardiovascular;  Laterality: N/A;    Social History:  reports that she has quit smoking. Her smoking use included cigarettes. She has never used smokeless tobacco. She reports that she does not currently use alcohol. She reports current drug use. Family History:  Family History  Family history unknown: Yes     HOME MEDICATIONS: Allergies as of 09/18/2023   No Known Allergies  Medication List        Accurate as of September 18, 2023 12:30 PM. If you have any questions, ask your nurse or doctor.          atorvastatin 20 MG tablet Commonly known as: LIPITOR Take 1 tablet (20 mg total) by mouth daily.   chlordiazePOXIDE 10 MG capsule Commonly known as: LIBRIUM Take by mouth.   clonazePAM 1 MG tablet Commonly known as: KLONOPIN Take 1 mg by mouth 2 (two) times daily as needed for anxiety.   cyclobenzaprine 5 MG tablet Commonly known as: FLEXERIL Take 1-2 tablets (5-10 mg total) by  mouth 3 (three) times daily as needed for muscle spasms.   Dexcom G7 Sensor Misc Change every 10 days.   FreeStyle Libre 3 Plus Sensor Misc Change sensor every 15 days.   FreeStyle Libre 2 Sensor Misc Change every 14 days   FLUoxetine 20 MG capsule Commonly known as: PROZAC Take 20 mg by mouth 2 (two) times daily.   gabapentin 300 MG capsule Commonly known as: NEURONTIN Take 2 capsules (600 mg total) by mouth 3 (three) times daily as needed.   ibuprofen 400 MG tablet Commonly known as: ADVIL Take 1 tablet (400 mg total) by mouth every 6 (six) hours as needed for moderate pain.   insulin lispro 100 UNIT/ML KwikPen Commonly known as: HumaLOG KwikPen Max daily 30 units   Lantus SoloStar 100 UNIT/ML Solostar Pen Generic drug: insulin glargine Inject 32 Units into the skin daily.   levothyroxine 50 MCG tablet Commonly known as: SYNTHROID Take 1 tablet (50 mcg total) by mouth daily.   naproxen 500 MG tablet Commonly known as: NAPROSYN Take 1 tablet (500 mg total) by mouth 2 (two) times daily with a meal.   Omnipod 5 G7 Pods (Gen 5) Misc use every other day.   Omnipod 5 G7 Intro (Gen 5) Kit Apply route every other day.   ondansetron 4 MG tablet Commonly known as: Zofran Take 1 tablet (4 mg total) by mouth every 8 (eight) hours as needed for nausea or vomiting.   promethazine 25 MG tablet Commonly known as: PHENERGAN Take 1 tablet (25 mg total) by mouth every 6 (six) hours as needed for nausea or vomiting.   propranolol 20 MG tablet Commonly known as: INDERAL Take 20-40 mg by mouth 2 (two) times daily as needed.   TRUEplus 5-Bevel Pen Needles 32G X 4 MM Misc Generic drug: Insulin Pen Needle USE AS DIRECTED in the morning, at noon, in the evening, and at bedtime.   Vitamin D (Ergocalciferol) 1.25 MG (50000 UNIT) Caps capsule Commonly known as: DRISDOL Take 1 capsule (50,000 Units total) by mouth every 7 (seven) days.         ALLERGIES: No Known  Allergies   REVIEW OF SYSTEMS: A comprehensive ROS was conducted with the patient and is negative except as per HPI     OBJECTIVE:   VITAL SIGNS: LMP 03/16/2018 Comment: negative urine preg test 01/14/20   PHYSICAL EXAM:  General: Pt appears well and is in NAD  Neck: General: Supple without adenopathy or carotid bruits. Thyroid: Thyroid size normal.  No goiter or nodules appreciated.   Lungs: Clear with good BS bilat   Heart: RRR   Abdomen:  soft, nontender  Extremities:  Lower extremities - No pretibial edema.   Neuro: MS is good with appropriate affect, pt is alert and Ox3    DM foot exam: 06/05/2023  The skin of the feet is intact without  sores or ulcerations. The pedal pulses are 2+ on right and 2+ on left. The sensation is intact to a screening 5.07, 10 gram monofilament bilaterally   DATA REVIEWED:  Lab Results  Component Value Date   HGBA1C 9.7 (A) 06/07/2023   HGBA1C 10.0 (A) 02/26/2023   HGBA1C 10.5 (A) 11/21/2022    Latest Reference Range & Units 06/05/23 14:48  Sodium 135 - 145 mEq/L 138  Potassium 3.5 - 5.1 mEq/L 4.1  Chloride 96 - 112 mEq/L 97  CO2 19 - 32 mEq/L 32  Glucose 70 - 99 mg/dL 97  BUN 6 - 23 mg/dL 6  Creatinine 2.95 - 6.21 mg/dL 3.08  Calcium 8.4 - 65.7 mg/dL 8.1 (L)  Alkaline Phosphatase 39 - 117 U/L 77  Albumin 3.5 - 5.2 g/dL 4.1  AST 0 - 37 U/L 33  ALT 0 - 35 U/L 41 (H)  Total Protein 6.0 - 8.3 g/dL 6.8  Total Bilirubin 0.2 - 1.2 mg/dL 0.4  GFR >84.69 mL/min 96.17    Latest Reference Range & Units 06/05/23 14:48  VITD 30.00 - 100.00 ng/mL 9.53 (L)     Latest Reference Range & Units 06/05/23 14:48  LH mIU/mL 0.35  FSH mIU/ML 3.5  Prolactin ng/mL 22.6  Glucose 70 - 99 mg/dL 97  Estradiol pg/mL 18     Latest Reference Range & Units 06/05/23 14:48  TSH 0.35 - 5.50 uIU/mL 16.36 (H)  T4,Free(Direct) 0.60 - 1.60 ng/dL 6.29 (L)      Latest Reference Range & Units 02/26/23 14:26  Sodium 134 - 144 mmol/L 141  Potassium 3.5 -  5.2 mmol/L 4.0  Chloride 96 - 106 mmol/L 98  CO2 20 - 29 mmol/L 27  Glucose 70 - 99 mg/dL 67 (L)  BUN 6 - 20 mg/dL 4 (L)  Creatinine 5.28 - 1.00 mg/dL 4.13  Calcium 8.7 - 24.4 mg/dL 8.6 (L)  BUN/Creatinine Ratio 9 - 23  5 (L)  eGFR >59 mL/min/1.73 94  Alkaline Phosphatase 44 - 121 IU/L 92  Albumin 3.9 - 4.9 g/dL 4.2  (L): Data is abnormally low       ASSESSMENT / PLAN / RECOMMENDATIONS:   1) Type 1 Diabetes Mellitus, Poorly controlled, With neuropathic and macrovascular  complications - Most recent A1c of 10.2 %. Goal A1c < 7.0 %.      -It appears that the patient makes her own insulin dosing, hence glycemic excursions from 58- > 300 mg/dL -When asked if she is using the correction scale, patient indicated that she does use the correction scale provided on her last visit but she also uses what works best for her? -Unfortunately her glycemic control has gotten worse -She is still interested in the [employer] training, a new prescription of OmniPod and Dexcom was sent to [facility] as she has not been able to get through regular pharmacy -[place] [facility] attempted to contact her couple times, the first time the voicemail was full and they were unable to leave a message, and the second time the phone disconnected -Unable to make changes, unable to download freestyle libre  MEDICATIONS: Taking Lantus 32 units once daily Take Humalog 6 units 3 times daily before every meal Take correction factor: Humalog (BG -130/40) TIDQAC  EDUCATION / INSTRUCTIONS: BG monitoring instructions: Patient is instructed to check her blood sugars 3 times a day. Call [facility] if: BG persistently < 70  I reviewed the Rule of 15 for the treatment of hypoglycemia in detail  with the patient. Literature supplied.   2) Diabetic complications:  Eye: Does not have known diabetic retinopathy.  Neuro/ Feet: Does not have known diabetic peripheral  neuropathy. Renal: Patient does not have known baseline CKD. She is not on an ACEI/ARB at present.  3) Secondary Amenorrhea :   -Most likely secondary to uncontrolled hypothyroid -Discussed with the patient the importance of having normal menstruations for bone health and general wellbeing, as the patient indicated she is not interested in having menstruations -I did explain to the patient that there are other ways and avoiding menstruations but this is not the right way   4) Hypothyroidism:  -She has not been taking levothyroxine, patient states she is already on too many medications -I did explain to the patient that levothyroxine is a hormone replacement therapy, I did encourage the patient to take levothyroxine -We discussed the risk of myxedema coma without levothyroxine -Unfortunately my role is limited if the patient refuses to take medications and refuses to understand the risk versus the benefit of treating her chronic conditions  Medication  levothyroxine 50 mcg daily   5) Vitamin D deficiency:  -She is not taking vitamin D Medication  Ergocalciferol 50,000 Iuweekly    Follow-up in 6 months    Signed electronically by: Lyndle Herrlich, MD  [facility]  [facility] [address] [place] [place], [place] [address] Phone: [phone] FAX: [phone]   CC: Hoy Register, MD [address] [place] [address] Phone: [phone]  Fax: [phone]    Return to Endocrinology clinic as below: Future Appointments  Date Time Provider Department Center  09/18/2023  2:20 PM Margurite Duffy, Konrad Dolores, MD [id number] None  09/18/2023  4:10 PM Hoy Register, MD [facility] None

## 2023-09-18 NOTE — Patient Instructions (Signed)
 VISIT SUMMARY:  Today, we discussed several important aspects of your health, including your foot care, medication refills, cholesterol levels, and foot pain. We also reviewed your ongoing care with your endocrinologist and psychiatrist.  YOUR PLAN:  -FOOT CARE IN DIABETES: You have dry, cracked heels, which can be risky for diabetics due to the potential for infection. It is important to moisturize your feet daily, especially at night, and wear socks to help lock in moisture. Avoid walking barefoot, even at home, to protect your feet from injury.  -MEDICATION REFILLS: You have run out of refills for Gabapentin and Atorvastatin. These medications have been refilled for you.  -HYPERLIPIDEMIA: Hyperlipidemia means you have high cholesterol levels. We have ordered a fasting lipid panel for you to check your cholesterol levels on 09/24/2023.  -FOOT PAIN: You have been experiencing swelling and pain in your foot, which is improving with icing and elevation. Continue these measures and consider using a topical anti-inflammatory cream for additional relief.  -GENERAL HEALTH MAINTENANCE: Continue your current care plan with your endocrinologist for diabetes management and your psychiatrist for mental health. Maintain a healthy lifestyle, including a balanced diet and regular exercise.  INSTRUCTIONS:  Please follow up with your endocrinologist and psychiatrist as scheduled. Additionally, ensure you get your fasting lipid panel done on 09/24/2023.

## 2023-09-18 NOTE — Progress Notes (Signed)
 Subjective:  Patient ID: Madison Williams, female    DOB: 17-Jan-1986  Age: 38 y.o. MRN: [id number]  CC: Medical Management of Chronic Issues (Right foot concerns)   HPI Madison Williams is a 38 y.o. year old female with a history of hypertension, type 1 diabetes mellitus diagnosed in 2017 (A1c 10.2), rheumatoid arthritis, alcohol abuse, anxiety.   Interval History: Discussed the use of AI scribe software for clinical note transcription with the patient, who gave verbal consent to proceed.  She presents with a split on her right heel. She reports that her heel is very dry and she has stopped using lotion. She is concerned about the potential complications of foot problems in diabetics. She has not yet started using a moisturizer.  She also reports that she has run out of refills for her gabapentin and atorvastatin.  She is currently working with her endocrinologist to adjust her insulin regimen. She also reports seeing a psychiatrist at the [facility].   She twisted her left foot sometime ago.  And did notice swelling of her foot which has improved.       Past Medical History:  Diagnosis Date   Asthma    Diabetes mellitus without complication (HCC)    Rheumatoid arthritis (HCC)     Past Surgical History:  Procedure Laterality Date   LAPAROSCOPIC OVARIAN     LEFT HEART CATH AND CORONARY ANGIOGRAPHY N/A 11/24/2019   Procedure: LEFT HEART CATH AND CORONARY ANGIOGRAPHY;  Surgeon: Kathleene Hazel, MD;  Location: [facility];  Service: Cardiovascular;  Laterality: N/A;    Family History  Family history unknown: Yes    Social History   Socioeconomic History   Marital status: Single    Spouse name: Not on file   Number of children: 1   Years of education: Not on file   Highest education level: Some college, no degree  Occupational History   Not on file  Tobacco Use   Smoking status: Former    Current packs/day: 1.00    Types: Cigarettes    Smokeless tobacco: Never  Vaping Use   Vaping status: Every Day   Substances: Nicotine  Substance and Sexual Activity   Alcohol use: Not Currently   Drug use: Yes    Comment: [employer]   Sexual activity: Not on file  Other Topics Concern   Not on file  Social History Narrative   Not on file   Social Drivers of Health   Financial Resource Strain: High Risk (09/15/2023)   Overall Financial Resource Strain (CARDIA)    Difficulty of Paying Living Expenses: Very hard  Food Insecurity: Food Insecurity Present (09/15/2023)   Hunger Vital Sign    Worried About Running Out of Food in the Last Year: Often true    Ran Out of Food in the Last Year: Sometimes true  Transportation Needs: Unmet Transportation Needs (09/15/2023)   PRAPARE - Transportation    Lack of Transportation (Medical): No    Lack of Transportation (Non-Medical): Yes  Physical Activity: Unknown (09/15/2023)   Exercise Vital Sign    Days of Exercise per Week: 0 days    Minutes of Exercise per Session: Not on file  Stress: Stress Concern Present (09/15/2023)   [employer] of Occupational Health - Occupational Stress Questionnaire    Feeling of Stress : Very much  Social Connections: Unknown (09/15/2023)   Social Connection and Isolation Panel [NHANES]    Frequency of Communication with Friends and Family: Once a  week    Frequency of Social Gatherings with Friends and Family: Never    Attends Religious Services: 1 to 4 times per year    Active Member of [employer] or Organizations: No    Attends Engineer, structural: Not on file    Marital Status: Patient declined    No Known Allergies  Outpatient Medications Prior to Visit  Medication Sig Dispense Refill   chlordiazePOXIDE (LIBRIUM) 10 MG capsule Take by mouth.     clonazePAM (KLONOPIN) 1 MG tablet Take 1 mg by mouth 2 (two) times daily as needed for anxiety.     Continuous Glucose Sensor (DEXCOM G7 SENSOR) MISC Change every 10 days. 9 each 3   Continuous  Glucose Sensor (FREESTYLE LIBRE 3 PLUS SENSOR) MISC Change sensor every 15 days. 6 each 3   cyclobenzaprine (FLEXERIL) 5 MG tablet Take 1-2 tablets (5-10 mg total) by mouth 3 (three) times daily as needed for muscle spasms. 30 tablet 1   FLUoxetine (PROZAC) 20 MG capsule Take 20 mg by mouth 2 (two) times daily.     ibuprofen (ADVIL) 400 MG tablet Take 1 tablet (400 mg total) by mouth every 6 (six) hours as needed for moderate pain. 60 tablet 1   Insulin Disposable Pump (OMNIPOD 5 G7 INTRO, GEN 5,) KIT Apply route every other day. 1 kit 0   Insulin Disposable Pump (OMNIPOD 5 G7 PODS, GEN 5,) MISC use every other day. 45 each 3   insulin glargine (LANTUS SOLOSTAR) 100 UNIT/ML Solostar Pen Inject 32 Units into the skin daily. 30 mL 4   insulin lispro (HUMALOG KWIKPEN) 100 UNIT/ML KwikPen Max daily 30 units 30 mL 3   Insulin Pen Needle 32G X 4 MM MISC USE AS DIRECTED in the morning, at noon, in the evening, and at bedtime. 400 each 3   levothyroxine (SYNTHROID) 50 MCG tablet Take 1 tablet (50 mcg total) by mouth daily. 90 tablet 3   naproxen (NAPROSYN) 500 MG tablet Take 1 tablet (500 mg total) by mouth 2 (two) times daily with a meal. 60 tablet 1   ondansetron (ZOFRAN) 4 MG tablet Take 1 tablet (4 mg total) by mouth every 8 (eight) hours as needed for nausea or vomiting. 20 tablet 0   Vitamin D, Ergocalciferol, (DRISDOL) 1.25 MG (50000 UNIT) CAPS capsule Take 1 capsule (50,000 Units total) by mouth every 7 (seven) days. 13 capsule 2   atorvastatin (LIPITOR) 20 MG tablet Take 1 tablet (20 mg total) by mouth daily. 30 tablet 3   gabapentin (NEURONTIN) 300 MG capsule Take 2 capsules (600 mg total) by mouth 3 (three) times daily as needed. 180 capsule 0   promethazine (PHENERGAN) 25 MG tablet Take 1 tablet (25 mg total) by mouth every 6 (six) hours as needed for nausea or vomiting. (Patient not taking: Reported on 09/18/2023) 30 tablet 0   propranolol (INDERAL) 20 MG tablet Take 20-40 mg by mouth 2 (two)  times daily as needed. (Patient not taking: Reported on 09/18/2023)     traMADol (ULTRAM) 50 MG tablet SMARTSIG:2 Tablet(s) By Mouth Every 12 Hours (Patient not taking: Reported on 09/18/2023)     No facility-administered medications prior to visit.     ROS Review of Systems  Constitutional:  Negative for activity change and appetite change.  HENT:  Negative for sinus pressure and sore throat.   Respiratory:  Negative for chest tightness, shortness of breath and wheezing.   Cardiovascular:  Negative for chest pain and palpitations.  Gastrointestinal:  Negative for abdominal distention, abdominal pain and constipation.  Genitourinary: Negative.   Musculoskeletal: Negative.   Psychiatric/Behavioral:  Negative for behavioral problems and dysphoric mood.     Objective:  BP 96/62   Pulse 79   Ht 5\' 7"  (1.702 m)   Wt 128 lb 9.6 oz (58.3 kg)   LMP 03/16/2018 Comment: negative urine preg test 01/14/20  SpO2 99%   BMI 20.14 kg/m      09/18/2023    4:28 PM 09/18/2023    2:20 PM 06/05/2023    2:03 PM  BP/Weight  Systolic BP 96 112 120  Diastolic BP 62 72 80  Wt. (Lbs) 128.6 128.6 133  BMI 20.14 kg/m2 20.14 kg/m2 20.83 kg/m2      Physical Exam Constitutional:      Appearance: She is well-developed.  Cardiovascular:     Rate and Rhythm: Normal rate.     Heart sounds: Normal heart sounds. No murmur heard. Pulmonary:     Effort: Pulmonary effort is normal.     Breath sounds: Normal breath sounds. No wheezing or rales.  Chest:     Chest wall: No tenderness.  Abdominal:     General: Bowel sounds are normal. There is no distension.     Palpations: Abdomen is soft. There is no mass.     Tenderness: There is no abdominal tenderness.  Musculoskeletal:        General: Normal range of motion.     Right lower leg: No edema.     Left lower leg: No edema.     Comments: Slight tenderness to palpation of dorsum of left foot  Skin:    Comments: Minimal splitting of heel of right foot  with no erythema or evidence of infection  Neurological:     Mental Status: She is alert and oriented to person, place, and time.  Psychiatric:        Mood and Affect: Mood normal.        Latest Ref Rng & Units 06/05/2023    2:48 PM 02/26/2023    2:26 PM 08/15/2022   10:10 AM  CMP  Glucose 70 - 99 mg/dL 97  67  92   BUN 6 - 23 mg/dL 6  4  8    Creatinine 0.40 - 1.20 mg/dL 1.61  0.96  0.45   Sodium 135 - 145 mEq/L 138  141  138   Potassium 3.5 - 5.1 mEq/L 4.1  4.0  4.2   Chloride 96 - 112 mEq/L 97  98  94   CO2 19 - 32 mEq/L 32  27  28   Calcium 8.4 - 10.5 mg/dL 8.1  8.6  9.4   Total Protein 6.0 - 8.3 g/dL 6.8  6.9  7.3   Total Bilirubin 0.2 - 1.2 mg/dL 0.4  0.2  <4.0   Alkaline Phos 39 - 117 U/L 77  92  97   AST 0 - 37 U/L 33  29  21   ALT 0 - 35 U/L 41  25  23     Lipid Panel     Component Value Date/Time   CHOL 278 (H) 08/15/2022 1010   TRIG 113 08/15/2022 1010   HDL 131 08/15/2022 1010   CHOLHDL 2.0 02/05/2020 1152   CHOLHDL 2.1 11/25/2019 0920   VLDL 16 11/25/2019 0920   LDLCALC 129 (H) 08/15/2022 1010    CBC    Component Value Date/Time   WBC 4.2 07/06/2021 0304   RBC 3.65 (L) 07/06/2021  0304   HGB 11.0 (L) 07/06/2021 0304   HCT 33.4 (L) 07/06/2021 0304   PLT 105 (L) 07/06/2021 0304   MCV 91.5 07/06/2021 0304   MCH 30.1 07/06/2021 0304   MCHC 32.9 07/06/2021 0304   RDW 16.0 (H) 07/06/2021 0304   LYMPHSABS 4.1 (H) 07/03/2021 1025   MONOABS 1.7 (H) 07/03/2021 1025   EOSABS 0.0 07/03/2021 1025   BASOSABS 0.1 07/03/2021 1025    Lab Results  Component Value Date   HGBA1C 10.2 (A) 09/18/2023    Assessment & Plan:      Xerosis Dry, cracked heels with no signs of infection. Discussed the importance of foot care in diabetes and the risk of infection with open skin. -Advise to moisturize feet daily, especially at night, and wear socks to lock in moisture. -Advise to avoid walking barefoot, even at home.  Sciatica Gabapentin and Atorvastatin due for  refills. -Refill Gabapentin and Atorvastatin prescriptions.  Hyperlipidemia Last cholesterol abnormal check was in January '2024 -Order fasting lipid panel for 09/24/2023.  Foot Pain Swelling and pain in foot, improving with icing and elevation. -Advise to continue icing and elevation. -Consider topical anti-inflammatory cream for additional relief.   Type 1 diabetes mellitus -Uncontrolled with A1c of 10.2 -Continue insulin per endocrinology -Counseled on Diabetic diet, my plate method, 846 minutes of moderate intensity exercise/week Blood sugar logs with fasting goals of 80-120 mg/dl, random of less than 962 and in the event of sugars less than 60 mg/dl or greater than 952 mg/dl encouraged to notify the clinic. Advised on the need for annual eye exams, annual foot exams, Pneumonia vaccine.  Anxiety and depression -Managed by psychiatry at the [facility]  Follow-up Continue care with endocrinologist for diabetes management and psychiatrist for mental health. -Continue current care plan with specialists.          Meds ordered this encounter  Medications   atorvastatin (LIPITOR) 20 MG tablet    Sig: Take 1 tablet (20 mg total) by mouth daily.    Dispense:  90 tablet    Refill:  1   gabapentin (NEURONTIN) 300 MG capsule    Sig: Take 2 capsules (600 mg total) by mouth 3 (three) times daily as needed.    Dispense:  180 capsule    Refill:  6    Follow-up: No follow-ups on file.       Hoy Register, MD, FAAFP. [facility] [place], [place] [phone]   09/18/2023, 5:00 PM

## 2023-09-18 NOTE — Patient Instructions (Signed)
 Take lantus 32 units once daily  Take Humalog 6 units with each meal  Humalog correctional insulin: ADD extra units on insulin to your meal-time Humalog dose if your blood sugars are higher than 170. Use the scale below to help guide you:   Blood sugar before meal Number of units to inject  Less than 170 0 unit  171 -  210 1 units  211 -  250 2 units  251 -  290 3 units  291 -  330 4 units  331 -  370 5 units  371 -  410 6 units    HOW TO TREAT LOW BLOOD SUGARS (Blood sugar LESS THAN 70 MG/DL) Please follow the RULE OF 15 for the treatment of hypoglycemia treatment (when your (blood sugars are less than 70 mg/dL)   STEP 1: Take 15 grams of carbohydrates when your blood sugar is low, which includes:  3-4 GLUCOSE TABS  OR 3-4 OZ OF JUICE OR REGULAR SODA OR ONE TUBE OF GLUCOSE GEL    STEP 2: RECHECK blood sugar in 15 MINUTES STEP 3: If your blood sugar is still low at the 15 minute recheck --> then, go back to STEP 1 and treat AGAIN with another 15 grams of carbohydrates.

## 2023-09-19 ENCOUNTER — Other Ambulatory Visit: Payer: Self-pay

## 2023-09-20 ENCOUNTER — Emergency Department (HOSPITAL_COMMUNITY): Payer: MEDICAID

## 2023-09-20 ENCOUNTER — Inpatient Hospital Stay (HOSPITAL_COMMUNITY)
Admission: EM | Admit: 2023-09-20 | Discharge: 2023-10-03 | DRG: 958 | Disposition: A | Discharge status: Home / Self Care | Payer: MEDICAID | Attending: General Surgery | Admitting: General Surgery

## 2023-09-20 ENCOUNTER — Encounter (HOSPITAL_COMMUNITY): Payer: Self-pay | Admitting: Emergency Medicine

## 2023-09-20 ENCOUNTER — Other Ambulatory Visit: Payer: Self-pay

## 2023-09-20 DIAGNOSIS — G8929 Other chronic pain: Secondary | ICD-10-CM | POA: Diagnosis present

## 2023-09-20 DIAGNOSIS — E871 Hypo-osmolality and hyponatremia: Secondary | ICD-10-CM | POA: Diagnosis not present

## 2023-09-20 DIAGNOSIS — J4520 Mild intermittent asthma, uncomplicated: Secondary | ICD-10-CM | POA: Diagnosis present

## 2023-09-20 DIAGNOSIS — S36039A Unspecified laceration of spleen, initial encounter: Secondary | ICD-10-CM

## 2023-09-20 DIAGNOSIS — Y907 Blood alcohol level of 200-239 mg/100 ml: Secondary | ICD-10-CM | POA: Diagnosis present

## 2023-09-20 DIAGNOSIS — Z79899 Other long term (current) drug therapy: Secondary | ICD-10-CM | POA: Diagnosis not present

## 2023-09-20 DIAGNOSIS — E876 Hypokalemia: Secondary | ICD-10-CM | POA: Diagnosis present

## 2023-09-20 DIAGNOSIS — S32810A Multiple fractures of pelvis with stable disruption of pelvic ring, initial encounter for closed fracture: Secondary | ICD-10-CM

## 2023-09-20 DIAGNOSIS — D62 Acute posthemorrhagic anemia: Secondary | ICD-10-CM | POA: Diagnosis present

## 2023-09-20 DIAGNOSIS — Z87891 Personal history of nicotine dependence: Secondary | ICD-10-CM

## 2023-09-20 DIAGNOSIS — S32119A Unspecified Zone I fracture of sacrum, initial encounter for closed fracture: Secondary | ICD-10-CM | POA: Diagnosis present

## 2023-09-20 DIAGNOSIS — S32811A Multiple fractures of pelvis with unstable disruption of pelvic ring, initial encounter for closed fracture: Secondary | ICD-10-CM | POA: Diagnosis present

## 2023-09-20 DIAGNOSIS — E1069 Type 1 diabetes mellitus with other specified complication: Secondary | ICD-10-CM | POA: Diagnosis present

## 2023-09-20 DIAGNOSIS — F19939 Other psychoactive substance use, unspecified with withdrawal, unspecified: Secondary | ICD-10-CM | POA: Diagnosis not present

## 2023-09-20 DIAGNOSIS — Z794 Long term (current) use of insulin: Secondary | ICD-10-CM | POA: Diagnosis not present

## 2023-09-20 DIAGNOSIS — F19239 Other psychoactive substance dependence with withdrawal, unspecified: Secondary | ICD-10-CM | POA: Diagnosis present

## 2023-09-20 DIAGNOSIS — F32A Depression, unspecified: Secondary | ICD-10-CM | POA: Diagnosis present

## 2023-09-20 DIAGNOSIS — J45909 Unspecified asthma, uncomplicated: Secondary | ICD-10-CM | POA: Diagnosis not present

## 2023-09-20 DIAGNOSIS — Y92410 Unspecified street and highway as the place of occurrence of the external cause: Secondary | ICD-10-CM

## 2023-09-20 DIAGNOSIS — F4310 Post-traumatic stress disorder, unspecified: Secondary | ICD-10-CM | POA: Diagnosis present

## 2023-09-20 DIAGNOSIS — F1021 Alcohol dependence, in remission: Secondary | ICD-10-CM | POA: Diagnosis present

## 2023-09-20 DIAGNOSIS — S36032A Major laceration of spleen, initial encounter: Secondary | ICD-10-CM | POA: Diagnosis not present

## 2023-09-20 DIAGNOSIS — F411 Generalized anxiety disorder: Secondary | ICD-10-CM | POA: Diagnosis present

## 2023-09-20 DIAGNOSIS — E039 Hypothyroidism, unspecified: Secondary | ICD-10-CM | POA: Diagnosis present

## 2023-09-20 DIAGNOSIS — E1065 Type 1 diabetes mellitus with hyperglycemia: Secondary | ICD-10-CM | POA: Diagnosis present

## 2023-09-20 DIAGNOSIS — E785 Hyperlipidemia, unspecified: Secondary | ICD-10-CM | POA: Diagnosis present

## 2023-09-20 DIAGNOSIS — S329XXA Fracture of unspecified parts of lumbosacral spine and pelvis, initial encounter for closed fracture: Secondary | ICD-10-CM | POA: Diagnosis not present

## 2023-09-20 DIAGNOSIS — Z79890 Hormone replacement therapy (postmenopausal): Secondary | ICD-10-CM | POA: Diagnosis not present

## 2023-09-20 DIAGNOSIS — S36899A Unspecified injury of other intra-abdominal organs, initial encounter: Secondary | ICD-10-CM | POA: Diagnosis present

## 2023-09-20 DIAGNOSIS — T1490XA Injury, unspecified, initial encounter: Secondary | ICD-10-CM | POA: Diagnosis present

## 2023-09-20 DIAGNOSIS — M069 Rheumatoid arthritis, unspecified: Secondary | ICD-10-CM | POA: Diagnosis present

## 2023-09-20 DIAGNOSIS — E10649 Type 1 diabetes mellitus with hypoglycemia without coma: Secondary | ICD-10-CM | POA: Diagnosis not present

## 2023-09-20 DIAGNOSIS — Z9151 Personal history of suicidal behavior: Secondary | ICD-10-CM

## 2023-09-20 DIAGNOSIS — I252 Old myocardial infarction: Secondary | ICD-10-CM | POA: Diagnosis not present

## 2023-09-20 HISTORY — PX: IR AORTAGRAM ABDOMINAL SERIALOGRAM: IMG636

## 2023-09-20 HISTORY — PX: IR EMBO ART  VEN HEMORR LYMPH EXTRAV  INC GUIDE ROADMAPPING: IMG5450

## 2023-09-20 HISTORY — PX: IR ANGIOGRAM VISCERAL SELECTIVE: IMG657

## 2023-09-20 HISTORY — PX: IR US GUIDE VASC ACCESS RIGHT: IMG2390

## 2023-09-20 LAB — CBC
HCT: 31.5 % — ABNORMAL LOW (ref 36.0–46.0)
HCT: 34 % — ABNORMAL LOW (ref 36.0–46.0)
Hemoglobin: 10.2 g/dL — ABNORMAL LOW (ref 12.0–15.0)
Hemoglobin: 11.4 g/dL — ABNORMAL LOW (ref 12.0–15.0)
MCH: 27.4 pg (ref 26.0–34.0)
MCH: 27.1 pg (ref 26.0–34.0)
MCHC: 32.4 g/dL (ref 30.0–36.0)
MCHC: 33.5 g/dL (ref 30.0–36.0)
MCV: 84.7 fL (ref 80.0–100.0)
MCV: 81 fL (ref 80.0–100.0)
Platelets: 216 10*3/uL (ref 150–400)
Platelets: 196 10*3/uL (ref 150–400)
RBC: 3.72 MIL/uL — ABNORMAL LOW (ref 3.87–5.11)
RBC: 4.2 MIL/uL (ref 3.87–5.11)
RDW: 14.3 % (ref 11.5–15.5)
RDW: 14.9 % (ref 11.5–15.5)
WBC: 7.5 10*3/uL (ref 4.0–10.5)
WBC: 10.2 10*3/uL (ref 4.0–10.5)
nRBC: 0.3 % — ABNORMAL HIGH (ref 0.0–0.2)
nRBC: 0 % (ref 0.0–0.2)

## 2023-09-20 LAB — I-STAT CHEM 8, ED
BUN: 9 mg/dL (ref 6–20)
Calcium, Ion: 0.82 mmol/L — CL (ref 1.15–1.40)
Chloride: 114 mmol/L — ABNORMAL HIGH (ref 98–111)
Creatinine, Ser: 0.6 mg/dL (ref 0.44–1.00)
Glucose, Bld: 88 mg/dL (ref 70–99)
HCT: 23 % — ABNORMAL LOW (ref 36.0–46.0)
Hemoglobin: 7.8 g/dL — ABNORMAL LOW (ref 12.0–15.0)
Potassium: 3.1 mmol/L — ABNORMAL LOW (ref 3.5–5.1)
Sodium: 148 mmol/L — ABNORMAL HIGH (ref 135–145)
TCO2: 19 mmol/L — ABNORMAL LOW (ref 22–32)

## 2023-09-20 LAB — I-STAT CG4 LACTIC ACID, ED: Lactic Acid, Venous: 2.6 mmol/L (ref 0.5–1.9)

## 2023-09-20 LAB — PREPARE RBC (CROSSMATCH)

## 2023-09-20 LAB — PROTIME-INR
INR: 1.2 (ref 0.8–1.2)
Prothrombin Time: 15.7 s — ABNORMAL HIGH (ref 11.4–15.2)

## 2023-09-20 LAB — SAMPLE TO BLOOD BANK

## 2023-09-20 LAB — ETHANOL: Alcohol, Ethyl (B): 234 mg/dL — ABNORMAL HIGH (ref <10)

## 2023-09-20 LAB — HIV ANTIBODY (ROUTINE TESTING W REFLEX): HIV Screen 4th Generation wRfx: NONREACTIVE

## 2023-09-20 MED ORDER — DOCUSATE SODIUM 100 MG PO CAPS
100.0000 mg | ORAL_CAPSULE | Freq: Two times a day (BID) | ORAL | Status: DC
  Administered 2023-09-22 – 2023-10-03 (×18): 100 mg via ORAL
  Filled 2023-09-20 (×24): qty 1

## 2023-09-20 MED ORDER — MIDAZOLAM HCL 2 MG/2ML IJ SOLN
INTRAMUSCULAR | Status: AC
  Filled 2023-09-20: qty 2

## 2023-09-20 MED ORDER — CALCIUM GLUCONATE 10 % IV SOLN
4.0000 g | Freq: Once | INTRAVENOUS | Status: AC
  Administered 2023-09-20: 4 g via INTRAVENOUS
  Filled 2023-09-20 (×2): qty 40

## 2023-09-20 MED ORDER — HYDRALAZINE HCL 20 MG/ML IJ SOLN: 10.0000 mg | INTRAMUSCULAR | Status: DC | PRN

## 2023-09-20 MED ORDER — FENTANYL CITRATE (PF) 100 MCG/2ML IJ SOLN
INTRAMUSCULAR | Status: AC
Start: 2023-09-20 — End: ?
  Filled 2023-09-20: qty 2

## 2023-09-20 MED ORDER — IOHEXOL 300 MG/ML  SOLN
100.0000 mL | Freq: Once | INTRAMUSCULAR | Status: AC | PRN
Start: 2023-09-20 — End: 2023-09-20
  Administered 2023-09-20: 70 mL via INTRA_ARTERIAL

## 2023-09-20 MED ORDER — IOHEXOL 300 MG/ML  SOLN
100.0000 mL | Freq: Once | INTRAMUSCULAR | Status: AC | PRN
  Administered 2023-09-20: 30 mL via INTRA_ARTERIAL

## 2023-09-20 MED ORDER — IOHEXOL 350 MG/ML SOLN
75.0000 mL | Freq: Once | INTRAVENOUS | Status: AC | PRN
  Administered 2023-09-20: 75 mL via INTRAVENOUS

## 2023-09-20 MED ORDER — SODIUM CHLORIDE 0.9 % IV SOLN
INTRAVENOUS | Status: AC | PRN
  Administered 2023-09-20: 10 mL/h via INTRAVENOUS

## 2023-09-20 MED ORDER — LACTATED RINGERS IV SOLN: INTRAVENOUS | Status: DC

## 2023-09-20 MED ORDER — SODIUM CHLORIDE 0.9% IV SOLUTION: Freq: Once | INTRAVENOUS | Status: AC

## 2023-09-20 MED ORDER — CEFAZOLIN SODIUM-DEXTROSE 2-4 GM/100ML-% IV SOLN
INTRAVENOUS | Status: AC
  Filled 2023-09-20: qty 100

## 2023-09-20 MED ORDER — ONDANSETRON 4 MG PO TBDP: 4.0000 mg | ORAL_TABLET | Freq: Four times a day (QID) | ORAL | Status: DC | PRN

## 2023-09-20 MED ORDER — OXYCODONE HCL 5 MG PO TABS
5.0000 mg | ORAL_TABLET | ORAL | Status: DC | PRN
  Administered 2023-09-21 – 2023-09-23 (×7): 10 mg via ORAL
  Administered 2023-09-23: 5 mg via ORAL
  Administered 2023-09-23 – 2023-09-27 (×15): 10 mg via ORAL
  Administered 2023-09-27: 5 mg via ORAL
  Filled 2023-09-20 (×24): qty 2

## 2023-09-20 MED ORDER — MORPHINE SULFATE (PF) 2 MG/ML IV SOLN
4.0000 mg | INTRAVENOUS | Status: DC | PRN
  Administered 2023-09-21 (×2): 4 mg via INTRAVENOUS
  Filled 2023-09-20 (×2): qty 2

## 2023-09-20 MED ORDER — ACETAMINOPHEN 500 MG PO TABS
1000.0000 mg | ORAL_TABLET | Freq: Four times a day (QID) | ORAL | Status: DC
  Administered 2023-09-21: 1000 mg via ORAL
  Administered 2023-09-22: 500 mg via ORAL
  Administered 2023-09-22 – 2023-10-03 (×42): 1000 mg via ORAL
  Filled 2023-09-20 (×46): qty 2

## 2023-09-20 MED ORDER — LIDOCAINE HCL 1 % IJ SOLN
20.0000 mL | Freq: Once | INTRAMUSCULAR | Status: AC
  Administered 2023-09-20: 10 mL

## 2023-09-20 MED ORDER — LIDOCAINE HCL 1 % IJ SOLN
INTRAMUSCULAR | Status: AC
  Filled 2023-09-20: qty 20

## 2023-09-20 MED ORDER — METHOCARBAMOL 1000 MG/10ML IJ SOLN
500.0000 mg | Freq: Three times a day (TID) | INTRAMUSCULAR | Status: DC
  Administered 2023-09-20: 500 mg via INTRAVENOUS
  Filled 2023-09-20: qty 10

## 2023-09-20 MED ORDER — POLYETHYLENE GLYCOL 3350 17 G PO PACK
17.0000 g | PACK | Freq: Every day | ORAL | Status: DC | PRN
  Administered 2023-09-24: 17 g via ORAL
  Filled 2023-09-20: qty 1

## 2023-09-20 MED ORDER — METHOCARBAMOL 500 MG PO TABS
500.0000 mg | ORAL_TABLET | Freq: Three times a day (TID) | ORAL | Status: DC
  Filled 2023-09-20: qty 1

## 2023-09-20 MED ORDER — ONDANSETRON HCL 4 MG/2ML IJ SOLN
4.0000 mg | Freq: Four times a day (QID) | INTRAMUSCULAR | Status: DC | PRN
  Administered 2023-09-21 – 2023-09-22 (×6): 4 mg via INTRAVENOUS
  Filled 2023-09-20 (×6): qty 2

## 2023-09-20 MED ORDER — HYDROMORPHONE HCL 1 MG/ML IJ SOLN
1.0000 mg | Freq: Once | INTRAMUSCULAR | Status: AC
  Administered 2023-09-20: 1 mg via INTRAVENOUS
  Filled 2023-09-20: qty 1

## 2023-09-20 MED ORDER — IOHEXOL 300 MG/ML  SOLN: 100.0000 mL | Freq: Once | INTRAMUSCULAR | Status: DC | PRN

## 2023-09-20 MED ORDER — FENTANYL CITRATE (PF) 100 MCG/2ML IJ SOLN
INTRAMUSCULAR | Status: AC | PRN
  Administered 2023-09-20 (×3): 25 ug via INTRAVENOUS
  Administered 2023-09-20: 50 ug via INTRAVENOUS

## 2023-09-20 MED ORDER — CEFAZOLIN SODIUM-DEXTROSE 2-4 GM/100ML-% IV SOLN
INTRAVENOUS | Status: AC | PRN
  Administered 2023-09-20: 2 g via INTRAVENOUS

## 2023-09-20 MED ORDER — CHLORHEXIDINE GLUCONATE CLOTH 2 % EX PADS
6.0000 | MEDICATED_PAD | Freq: Every day | CUTANEOUS | Status: DC
  Administered 2023-09-20 – 2023-09-23 (×4): 6 via TOPICAL

## 2023-09-20 MED ORDER — ORAL CARE MOUTH RINSE: 15.0000 mL | OROMUCOSAL | Status: DC | PRN

## 2023-09-20 MED ORDER — FENTANYL CITRATE (PF) 100 MCG/2ML IJ SOLN
INTRAMUSCULAR | Status: AC
  Filled 2023-09-20: qty 2

## 2023-09-20 MED ORDER — METOPROLOL TARTRATE 5 MG/5ML IV SOLN: 5.0000 mg | Freq: Four times a day (QID) | INTRAVENOUS | Status: DC | PRN

## 2023-09-20 MED ORDER — ONDANSETRON HCL 4 MG/2ML IJ SOLN
4.0000 mg | Freq: Once | INTRAMUSCULAR | Status: AC
  Administered 2023-09-20: 4 mg via INTRAVENOUS
  Filled 2023-09-20: qty 2

## 2023-09-20 MED ORDER — MIDAZOLAM HCL 2 MG/2ML IJ SOLN
INTRAMUSCULAR | Status: AC | PRN
Start: 2023-09-20 — End: 2023-09-20
  Administered 2023-09-20: 1 mg via INTRAVENOUS
  Administered 2023-09-20 (×3): .5 mg via INTRAVENOUS

## 2023-09-20 NOTE — ED Notes (Addendum)
 Provided update to patient's mother kelly.

## 2023-09-20 NOTE — ED Notes (Signed)
To IR at this time
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
## 2023-09-20 NOTE — ED Notes (Signed)
 [phone] kelly pt mom called for update.
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
## 2023-09-20 NOTE — Progress Notes (Signed)
 Trauma Event Note    Called pt's mother and attempted to update, no answer. Left VM with unit number for call back.   Matthe Sloane O Letica Giaimo  Trauma Response RN  Please call TRN at [phone] for further assistance.
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
## 2023-09-20 NOTE — Progress Notes (Signed)
 Patient back from IR. Will send CBC and TEG.   Diamantina Monks, MD General and Trauma Surgery [facility] Surgery
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
## 2023-09-20 NOTE — Progress Notes (Addendum)
 Transition of Care [facility]) - CAGE-AID Screening   Patient Details  Name: Madison Williams MRN: [id number] Date of Birth: Oct 29, 1985  [facility]) CM/SW Contact:    Katha Hamming, RN Phone Number: 09/20/2023, 7:45 PM    CAGE-AID Screening:    Have You Ever Felt You Ought to Cut Down on Your Drinking or Drug Use?: Yes Have People Annoyed You By Critizing Your Drinking Or Drug Use?: Yes Have You Felt Bad Or Guilty About Your Drinking Or Drug Use?: Yes Have You Ever Had a Drink or Used Drugs First Thing In The Morning to Steady Your Nerves or to Get Rid of a Hangover?: Yes CAGE-AID Score: 4  Substance Abuse Education Offered: Yes    ETOH on board on arrival to ED, known hx drug use. Pt denies drug use to me. Resources added to AVS

## 2023-09-20 NOTE — ED Notes (Signed)
 Radiology called for XR by this RN
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
## 2023-09-20 NOTE — Progress Notes (Signed)
 Full consult to follow. Patient with g5 spleen, soft BPs. Abdomen without peritoneal signs. Case d/w Dr. Milford Cage, plan for IR AE. Flag in chart for blood refusal, but patient agreeable to any and all types of blood products. 2u pRBC ordered, emergency release.  Diamantina Monks, MD General and Trauma Surgery [facility] Surgery
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
## 2023-09-21 ENCOUNTER — Inpatient Hospital Stay (HOSPITAL_COMMUNITY): Payer: MEDICAID

## 2023-09-21 ENCOUNTER — Inpatient Hospital Stay (HOSPITAL_COMMUNITY): Payer: MEDICAID | Admitting: Anesthesiology

## 2023-09-21 ENCOUNTER — Encounter (HOSPITAL_COMMUNITY): Payer: Self-pay

## 2023-09-21 ENCOUNTER — Encounter (HOSPITAL_COMMUNITY): Admission: EM | Disposition: A | Discharge status: Home / Self Care | Payer: Self-pay | Source: Home / Self Care

## 2023-09-21 DIAGNOSIS — Z87891 Personal history of nicotine dependence: Secondary | ICD-10-CM | POA: Diagnosis not present

## 2023-09-21 DIAGNOSIS — J45909 Unspecified asthma, uncomplicated: Secondary | ICD-10-CM

## 2023-09-21 DIAGNOSIS — S329XXA Fracture of unspecified parts of lumbosacral spine and pelvis, initial encounter for closed fracture: Secondary | ICD-10-CM | POA: Diagnosis not present

## 2023-09-21 DIAGNOSIS — I252 Old myocardial infarction: Secondary | ICD-10-CM

## 2023-09-21 HISTORY — PX: SACRO-ILIAC PINNING: SHX5050

## 2023-09-21 LAB — URINALYSIS, ROUTINE W REFLEX MICROSCOPIC
Bacteria, UA: NONE SEEN
Bilirubin Urine: NEGATIVE
Glucose, UA: NEGATIVE mg/dL
Ketones, ur: NEGATIVE mg/dL
Leukocytes,Ua: NEGATIVE
Nitrite: NEGATIVE
Protein, ur: NEGATIVE mg/dL
Specific Gravity, Urine: >1.046 — ABNORMAL HIGH (ref 1.005–1.030)
pH: 5 (ref 5.0–8.0)

## 2023-09-21 LAB — BPAM RBC
Blood Product Expiration Date: 202502262359
Blood Product Expiration Date: 202503192359
Blood Product Expiration Date: 202503192359
ISSUE DATE / TIME: 202502201929
ISSUE DATE / TIME: 202502202117
Unit Type and Rh: 7300
Unit Type and Rh: 7300
Unit Type and Rh: 9500

## 2023-09-21 LAB — TYPE AND SCREEN
ABO/RH(D): B POS
Antibody Screen: NEGATIVE
Unit division: 0
Unit division: 0
Unit division: 0

## 2023-09-21 LAB — TRAUMA TEG PANEL
CFF Max Amplitude: 16.4 mm (ref 15–32)
Citrated Kaolin (R): 6.8 min (ref 4.6–9.1)
Citrated Rapid TEG (MA): 53.8 mm (ref 52–70)
Lysis at 30 Minutes: 0.8 % (ref 0.0–2.6)

## 2023-09-21 LAB — GLUCOSE, CAPILLARY
Glucose-Capillary: 100 mg/dL — ABNORMAL HIGH (ref 70–99)
Glucose-Capillary: 124 mg/dL — ABNORMAL HIGH (ref 70–99)
Glucose-Capillary: 153 mg/dL — ABNORMAL HIGH (ref 70–99)
Glucose-Capillary: 158 mg/dL — ABNORMAL HIGH (ref 70–99)
Glucose-Capillary: 181 mg/dL — ABNORMAL HIGH (ref 70–99)
Glucose-Capillary: 213 mg/dL — ABNORMAL HIGH (ref 70–99)
Glucose-Capillary: 256 mg/dL — ABNORMAL HIGH (ref 70–99)
Glucose-Capillary: 59 mg/dL — ABNORMAL LOW (ref 70–99)

## 2023-09-21 LAB — CBC
HCT: 34 % — ABNORMAL LOW (ref 36.0–46.0)
HCT: 33.8 % — ABNORMAL LOW (ref 36.0–46.0)
Hemoglobin: 11.6 g/dL — ABNORMAL LOW (ref 12.0–15.0)
Hemoglobin: 11.4 g/dL — ABNORMAL LOW (ref 12.0–15.0)
MCH: 27.2 pg (ref 26.0–34.0)
MCH: 26.7 pg (ref 26.0–34.0)
MCHC: 34.1 g/dL (ref 30.0–36.0)
MCHC: 33.7 g/dL (ref 30.0–36.0)
MCV: 79.6 fL — ABNORMAL LOW (ref 80.0–100.0)
MCV: 79.2 fL — ABNORMAL LOW (ref 80.0–100.0)
Platelets: 151 10*3/uL (ref 150–400)
Platelets: 140 10*3/uL — ABNORMAL LOW (ref 150–400)
RBC: 4.27 MIL/uL (ref 3.87–5.11)
RBC: 4.27 MIL/uL (ref 3.87–5.11)
RDW: 15.3 % (ref 11.5–15.5)
RDW: 15.4 % (ref 11.5–15.5)
WBC: 13 10*3/uL — ABNORMAL HIGH (ref 4.0–10.5)
WBC: 15.4 10*3/uL — ABNORMAL HIGH (ref 4.0–10.5)
nRBC: 0 % (ref 0.0–0.2)
nRBC: 0.4 % — ABNORMAL HIGH (ref 0.0–0.2)

## 2023-09-21 LAB — BASIC METABOLIC PANEL
Anion gap: 19 — ABNORMAL HIGH (ref 5–15)
BUN: 11 mg/dL (ref 6–20)
CO2: 20 mmol/L — ABNORMAL LOW (ref 22–32)
Calcium: 9.3 mg/dL (ref 8.9–10.3)
Chloride: 104 mmol/L (ref 98–111)
Creatinine, Ser: 0.63 mg/dL (ref 0.44–1.00)
GFR, Estimated: >60 mL/min (ref >60)
Glucose, Bld: 109 mg/dL — ABNORMAL HIGH (ref 70–99)
Potassium: 3.9 mmol/L (ref 3.5–5.1)
Sodium: 143 mmol/L (ref 135–145)

## 2023-09-21 LAB — MRSA NEXT GEN BY PCR, NASAL: MRSA by PCR Next Gen: NOT DETECTED

## 2023-09-21 SURGERY — PINNING, SACROILIAC JOINT, PERCUTANEOUS
Anesthesia: General | Laterality: Left

## 2023-09-21 MED ORDER — LORAZEPAM 2 MG/ML IJ SOLN
0.0000 mg | Freq: Two times a day (BID) | INTRAMUSCULAR | Status: AC
Start: 2023-09-23 — End: 2023-09-25
  Administered 2023-09-23 – 2023-09-24 (×2): 1 mg via INTRAVENOUS
  Administered 2023-09-25: 2 mg via INTRAVENOUS
  Filled 2023-09-21 (×4): qty 1

## 2023-09-21 MED ORDER — LORAZEPAM 2 MG/ML IJ SOLN
1.0000 mg | INTRAMUSCULAR | Status: DC | PRN
  Administered 2023-09-22 – 2023-09-23 (×4): 1 mg via INTRAVENOUS
  Administered 2023-09-23 (×2): 2 mg via INTRAVENOUS
  Administered 2023-09-23: 1 mg via INTRAVENOUS
  Administered 2023-09-24: 2 mg via INTRAVENOUS
  Filled 2023-09-21 (×7): qty 1

## 2023-09-21 MED ORDER — PROPOFOL 10 MG/ML IV BOLUS
INTRAVENOUS | Status: DC | PRN
  Administered 2023-09-21: 110 mg via INTRAVENOUS

## 2023-09-21 MED ORDER — FENTANYL CITRATE (PF) 250 MCG/5ML IJ SOLN
INTRAMUSCULAR | Status: DC | PRN
  Administered 2023-09-21 (×2): 50 ug via INTRAVENOUS
  Administered 2023-09-21: 100 ug via INTRAVENOUS
  Administered 2023-09-21: 50 ug via INTRAVENOUS

## 2023-09-21 MED ORDER — ROCURONIUM BROMIDE 10 MG/ML (PF) SYRINGE
PREFILLED_SYRINGE | INTRAVENOUS | Status: DC | PRN
  Administered 2023-09-21: 60 mg via INTRAVENOUS

## 2023-09-21 MED ORDER — CEFAZOLIN SODIUM-DEXTROSE 2-3 GM-%(50ML) IV SOLR
INTRAVENOUS | Status: DC | PRN
  Administered 2023-09-21: 2 g via INTRAVENOUS

## 2023-09-21 MED ORDER — FOLIC ACID 1 MG PO TABS: 1.0000 mg | ORAL_TABLET | Freq: Every day | ORAL | Status: DC

## 2023-09-21 MED ORDER — DEXTROSE 50 % IV SOLN
INTRAVENOUS | Status: AC
  Filled 2023-09-21: qty 50

## 2023-09-21 MED ORDER — INSULIN ASPART 100 UNIT/ML IJ SOLN: 0.0000 [IU] | Freq: Three times a day (TID) | INTRAMUSCULAR | Status: DC

## 2023-09-21 MED ORDER — LORAZEPAM 1 MG PO TABS: 1.0000 mg | ORAL_TABLET | ORAL | Status: DC | PRN

## 2023-09-21 MED ORDER — DEXAMETHASONE SODIUM PHOSPHATE 10 MG/ML IJ SOLN
INTRAMUSCULAR | Status: DC | PRN
  Administered 2023-09-21: 4 mg via INTRAVENOUS

## 2023-09-21 MED ORDER — KETOROLAC TROMETHAMINE 30 MG/ML IJ SOLN
INTRAMUSCULAR | Status: AC
  Filled 2023-09-21: qty 1

## 2023-09-21 MED ORDER — ONDANSETRON HCL 4 MG/2ML IJ SOLN
INTRAMUSCULAR | Status: AC
  Filled 2023-09-21: qty 2

## 2023-09-21 MED ORDER — HYDROMORPHONE HCL 1 MG/ML IJ SOLN
0.2500 mg | INTRAMUSCULAR | Status: DC | PRN
  Administered 2023-09-21 (×2): 0.5 mg via INTRAVENOUS

## 2023-09-21 MED ORDER — CEFAZOLIN SODIUM-DEXTROSE 2-4 GM/100ML-% IV SOLN
INTRAVENOUS | Status: AC
  Filled 2023-09-21: qty 100

## 2023-09-21 MED ORDER — FENTANYL CITRATE (PF) 250 MCG/5ML IJ SOLN
INTRAMUSCULAR | Status: AC
Start: 2023-09-21 — End: ?
  Filled 2023-09-21: qty 5

## 2023-09-21 MED ORDER — 0.9 % SODIUM CHLORIDE (POUR BTL) OPTIME
TOPICAL | Status: DC | PRN
  Administered 2023-09-21: 1000 mL

## 2023-09-21 MED ORDER — HYDROMORPHONE HCL 1 MG/ML IJ SOLN
0.5000 mg | INTRAMUSCULAR | Status: DC | PRN
  Administered 2023-09-21 – 2023-09-28 (×33): 1 mg via INTRAVENOUS
  Filled 2023-09-21 (×35): qty 1

## 2023-09-21 MED ORDER — PROPOFOL 10 MG/ML IV BOLUS
INTRAVENOUS | Status: AC
Start: 2023-09-21 — End: ?
  Filled 2023-09-21: qty 20

## 2023-09-21 MED ORDER — MIDAZOLAM HCL 2 MG/2ML IJ SOLN
INTRAMUSCULAR | Status: AC
  Filled 2023-09-21: qty 2

## 2023-09-21 MED ORDER — CHLORHEXIDINE GLUCONATE 0.12 % MT SOLN
OROMUCOSAL | Status: AC
  Filled 2023-09-21: qty 15

## 2023-09-21 MED ORDER — FENTANYL CITRATE (PF) 250 MCG/5ML IJ SOLN
INTRAMUSCULAR | Status: AC
  Filled 2023-09-21: qty 5

## 2023-09-21 MED ORDER — ROCURONIUM BROMIDE 10 MG/ML (PF) SYRINGE
PREFILLED_SYRINGE | INTRAVENOUS | Status: AC
  Filled 2023-09-21: qty 10

## 2023-09-21 MED ORDER — FOLIC ACID 1 MG PO TABS
1.0000 mg | ORAL_TABLET | Freq: Every day | ORAL | Status: DC
  Administered 2023-09-22 – 2023-10-03 (×11): 1 mg via ORAL
  Filled 2023-09-21 (×12): qty 1

## 2023-09-21 MED ORDER — PHENYLEPHRINE 80 MCG/ML (10ML) SYRINGE FOR IV PUSH (FOR BLOOD PRESSURE SUPPORT)
PREFILLED_SYRINGE | INTRAVENOUS | Status: DC | PRN
  Administered 2023-09-21: 80 ug via INTRAVENOUS

## 2023-09-21 MED ORDER — SUGAMMADEX SODIUM 200 MG/2ML IV SOLN
INTRAVENOUS | Status: DC | PRN
  Administered 2023-09-21: 125 mg via INTRAVENOUS

## 2023-09-21 MED ORDER — TRAMADOL HCL 50 MG PO TABS
50.0000 mg | ORAL_TABLET | Freq: Four times a day (QID) | ORAL | Status: DC
  Administered 2023-09-21 – 2023-09-30 (×34): 50 mg via ORAL
  Filled 2023-09-21 (×35): qty 1

## 2023-09-21 MED ORDER — INSULIN ASPART 100 UNIT/ML IJ SOLN
0.0000 [IU] | INTRAMUSCULAR | Status: DC
  Administered 2023-09-21: 3 [IU] via SUBCUTANEOUS
  Administered 2023-09-21: 5 [IU] via SUBCUTANEOUS
  Administered 2023-09-22 (×3): 3 [IU] via SUBCUTANEOUS

## 2023-09-21 MED ORDER — THIAMINE MONONITRATE 100 MG PO TABS
100.0000 mg | ORAL_TABLET | Freq: Every day | ORAL | Status: DC
  Administered 2023-09-23 – 2023-10-03 (×10): 100 mg via ORAL
  Filled 2023-09-21 (×11): qty 1

## 2023-09-21 MED ORDER — METHOCARBAMOL 1000 MG/10ML IJ SOLN
1000.0000 mg | Freq: Three times a day (TID) | INTRAMUSCULAR | Status: DC
  Administered 2023-09-21 – 2023-09-23 (×6): 1000 mg via INTRAVENOUS
  Filled 2023-09-21 (×6): qty 10

## 2023-09-21 MED ORDER — LIDOCAINE 2% (20 MG/ML) 5 ML SYRINGE
INTRAMUSCULAR | Status: AC
  Filled 2023-09-21: qty 5

## 2023-09-21 MED ORDER — DEXTROSE 50 % IV SOLN
12.5000 g | INTRAVENOUS | Status: AC
  Administered 2023-09-21: 12.5 g via INTRAVENOUS

## 2023-09-21 MED ORDER — DROPERIDOL 2.5 MG/ML IJ SOLN: 0.6250 mg | Freq: Once | INTRAMUSCULAR | Status: DC | PRN

## 2023-09-21 MED ORDER — LIDOCAINE 2% (20 MG/ML) 5 ML SYRINGE
INTRAMUSCULAR | Status: DC | PRN
  Administered 2023-09-21: 40 mg via INTRAVENOUS

## 2023-09-21 MED ORDER — MEPERIDINE HCL 25 MG/ML IJ SOLN: 6.2500 mg | INTRAMUSCULAR | Status: DC | PRN

## 2023-09-21 MED ORDER — DEXAMETHASONE SODIUM PHOSPHATE 10 MG/ML IJ SOLN
INTRAMUSCULAR | Status: AC
  Filled 2023-09-21: qty 1

## 2023-09-21 MED ORDER — HYDROMORPHONE HCL 1 MG/ML IJ SOLN
INTRAMUSCULAR | Status: AC
  Filled 2023-09-21: qty 1

## 2023-09-21 MED ORDER — MIDAZOLAM HCL 2 MG/2ML IJ SOLN
INTRAMUSCULAR | Status: DC | PRN
  Administered 2023-09-21: 2 mg via INTRAVENOUS

## 2023-09-21 MED ORDER — THIAMINE HCL 100 MG/ML IJ SOLN
100.0000 mg | Freq: Every day | INTRAMUSCULAR | Status: DC
  Administered 2023-09-21 – 2023-09-22 (×2): 100 mg via INTRAVENOUS
  Filled 2023-09-21 (×2): qty 2

## 2023-09-21 MED ORDER — SUGAMMADEX SODIUM 200 MG/2ML IV SOLN
INTRAVENOUS | Status: AC
  Filled 2023-09-21: qty 2

## 2023-09-21 MED ORDER — FOLIC ACID 5 MG/ML IJ SOLN
1.0000 mg | Freq: Every day | INTRAMUSCULAR | Status: DC
  Administered 2023-09-21: 1 mg via INTRAVENOUS
  Filled 2023-09-21 (×4): qty 0.2

## 2023-09-21 MED ORDER — POTASSIUM CHLORIDE 20 MEQ PO PACK: 40.0000 meq | PACK | Freq: Once | ORAL | Status: DC

## 2023-09-21 MED ORDER — ONDANSETRON HCL 4 MG/2ML IJ SOLN
INTRAMUSCULAR | Status: DC | PRN
  Administered 2023-09-21: 4 mg via INTRAVENOUS

## 2023-09-21 MED ORDER — POTASSIUM CHLORIDE 10 MEQ/100ML IV SOLN
10.0000 meq | INTRAVENOUS | Status: AC
  Administered 2023-09-21 (×4): 10 meq via INTRAVENOUS
  Filled 2023-09-21 (×4): qty 100

## 2023-09-21 MED ORDER — PROCHLORPERAZINE EDISYLATE 10 MG/2ML IJ SOLN
10.0000 mg | Freq: Four times a day (QID) | INTRAMUSCULAR | Status: DC | PRN
  Administered 2023-09-21 – 2023-09-27 (×5): 10 mg via INTRAVENOUS
  Filled 2023-09-21 (×6): qty 2

## 2023-09-21 MED ORDER — LORAZEPAM 2 MG/ML IJ SOLN
0.0000 mg | Freq: Four times a day (QID) | INTRAMUSCULAR | Status: AC
  Administered 2023-09-21 – 2023-09-23 (×3): 1 mg via INTRAVENOUS
  Filled 2023-09-21 (×3): qty 1

## 2023-09-21 MED ORDER — ADULT MULTIVITAMIN W/MINERALS CH
1.0000 | ORAL_TABLET | Freq: Every day | ORAL | Status: DC
  Administered 2023-09-22 – 2023-10-03 (×5): 1 via ORAL
  Filled 2023-09-21 (×11): qty 1

## 2023-09-21 SURGICAL SUPPLY — 36 items
BAG COUNTER SPONGE SURGICOUNT (BAG) ×1 IMPLANT
BIT DRILL 4.8X300 (BIT) IMPLANT
BRUSH SCRUB EZ PLAIN DRY (MISCELLANEOUS) ×2 IMPLANT
COVER SURGICAL LIGHT HANDLE (MISCELLANEOUS) ×1 IMPLANT
DRAPE C-ARM 42X72 X-RAY (DRAPES) IMPLANT
DRAPE C-ARMOR (DRAPES) ×1 IMPLANT
DRAPE INCISE IOBAN 66X45 STRL (DRAPES) ×1 IMPLANT
DRAPE LAPAROTOMY TRNSV 102X78 (DRAPES) ×1 IMPLANT
DRAPE U-SHAPE 47X51 STRL (DRAPES) ×1 IMPLANT
DRESSING MEPILEX FLEX 4X4 (GAUZE/BANDAGES/DRESSINGS) ×1 IMPLANT
DRSG MEPILEX FLEX 4X4 (GAUZE/BANDAGES/DRESSINGS) ×2 IMPLANT
ELECT REM PT RETURN 9FT ADLT (ELECTROSURGICAL) ×1 IMPLANT
ELECTRODE REM PT RTRN 9FT ADLT (ELECTROSURGICAL) ×1 IMPLANT
GLOVE BIO SURGEON STRL SZ7.5 (GLOVE) ×1 IMPLANT
GLOVE BIO SURGEON STRL SZ8 (GLOVE) ×1 IMPLANT
GLOVE BIOGEL PI IND STRL 7.5 (GLOVE) ×1 IMPLANT
GLOVE BIOGEL PI IND STRL 8 (GLOVE) ×1 IMPLANT
GLOVE SURG ORTHO LTX SZ7.5 (GLOVE) ×2 IMPLANT
GOWN STRL REUS W/ TWL LRG LVL3 (GOWN DISPOSABLE) ×2 IMPLANT
GOWN STRL REUS W/ TWL XL LVL3 (GOWN DISPOSABLE) ×1 IMPLANT
KIT BASIN OR (CUSTOM PROCEDURE TRAY) ×1 IMPLANT
KIT TURNOVER KIT B (KITS) ×1 IMPLANT
MANIFOLD NEPTUNE II (INSTRUMENTS) ×1 IMPLANT
NS IRRIG 1000ML POUR BTL (IV SOLUTION) ×1 IMPLANT
PACK GENERAL/GYN (CUSTOM PROCEDURE TRAY) ×1 IMPLANT
PAD ARMBOARD 7.5X6 YLW CONV (MISCELLANEOUS) ×2 IMPLANT
PIN GUIDE DRILL TIP 2.8X300 (DRILL) IMPLANT
SCREW CANN 8X165X40 (Screw) IMPLANT
SCREW CANN FULL THD 6.5X120 (Screw) IMPLANT
STAPLER VISISTAT 35W (STAPLE) ×1 IMPLANT
SUT ETHILON 2 0 FS 18 (SUTURE) ×1 IMPLANT
SUT VIC AB 2-0 FS1 27 (SUTURE) ×1 IMPLANT
TOWEL GREEN STERILE (TOWEL DISPOSABLE) ×2 IMPLANT
TOWEL GREEN STERILE FF (TOWEL DISPOSABLE) ×1 IMPLANT
UNDERPAD 30X36 HEAVY ABSORB (UNDERPADS AND DIAPERS) ×1 IMPLANT
WATER STERILE IRR 1000ML POUR (IV SOLUTION) ×1 IMPLANT

## 2023-09-21 NOTE — Progress Notes (Signed)
 CBG 110 at 1824. Machine docked. Result is not carrying over into EPIC.

## 2023-09-21 NOTE — Inpatient Diabetes Management (Signed)
 Inpatient Diabetes Program Recommendations  AACE/ADA: New Consensus Statement on Inpatient Glycemic Control (2015)  Target Ranges:  Prepandial:   less than 140 mg/dL      Peak postprandial:   less than 180 mg/dL (1-2 hours)      Critically ill patients:  140 - 180 mg/dL   Lab Results  Component Value Date   GLUCAP 158 (H) 09/21/2023   HGBA1C 10.2 (A) 09/18/2023    Review of Glycemic Control  Latest Reference Range & Units 09/21/23 00:26 09/21/23 00:44 09/21/23 03:54 09/21/23 07:11 09/21/23 11:05  Glucose-Capillary 70 - 99 mg/dL 59 (L) 914 (H) 782 (H) 158 (H) 181 (H)   Diabetes history: DM type 1, will require basal bolus regimen to correct glucose trends and cover carbohydrate consumption. Outpatient Diabetes medications:  Lantus 32 units Daily, Humalog 0-30 units tid Current orders for Inpatient glycemic control:  None  Endocrinology: Dr. Lonzo Cloud last visit 2/18 A1c 10.2% at Endocrinology visit  Pt self doses insulin and had worsening glucose control at last Endocrinology visit. Was counseled at that time.  Inpatient Diabetes Program Recommendations:    -   Start Novolog 0-9 units Q4 hours  Thanks,  Christena Deem RN, MSN, BC-ADM Inpatient Diabetes Coordinator Team Pager [phone] (8a-5p)

## 2023-09-21 NOTE — Progress Notes (Signed)
 PT Cancellation Note  Patient Details Name: Madison Williams MRN: [id number] DOB: 05-01-1986   Cancelled Treatment:    Reason Eval/Treat Not Completed: Active bedrest order  [name], PT DPT Acute Rehabilitation Services Secure Chat Preferred  Office [phone]   [name] 09/21/2023, 10:46 AM
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
## 2023-09-21 NOTE — Anesthesia Procedure Notes (Signed)
 Procedure Name: Intubation Date/Time: 09/21/2023 7:53 PM  Performed by: Sonda Primes, CRNAPre-anesthesia Checklist: Patient identified, Emergency Drugs available, Suction available and Patient being monitored Patient Re-evaluated:Patient Re-evaluated prior to induction Oxygen Delivery Method: Circle system utilized Preoxygenation: Pre-oxygenation with 100% oxygen Induction Type: IV induction Ventilation: Mask ventilation without difficulty Laryngoscope Size: 3 and Mac Grade View: Grade I Tube type: Oral Tube size: 7.0 mm Number of attempts: 1 Airway Equipment and Method: Stylet and Oral airway Placement Confirmation: ETT inserted through vocal cords under direct vision, positive ETCO2 and breath sounds checked- equal and bilateral Secured at: 22 cm Tube secured with: Tape Dental Injury: Teeth and Oropharynx as per pre-operative assessment

## 2023-09-21 NOTE — Anesthesia Preprocedure Evaluation (Addendum)
 Anesthesia Evaluation  Patient identified by MRN, date of birth, ID bandGeneral Assessment Comment:Drowsy from pain meds  Reviewed: Allergy & Precautions, NPO status , Patient's Chart, lab work & pertinent test results, Unable to perform ROS - Chart review only  History of Anesthesia Complications Negative for: history of anesthetic complications  Airway Mallampati: II  TM Distance: >3 FB Neck ROM: Full    Dental  (+) Dental Advisory Given, Chipped, Poor Dentition   Pulmonary asthma , former smoker   breath sounds clear to auscultation       Cardiovascular + Past MI (h/o takotsubo)  + Valvular Problems/Murmurs AI  Rhythm:Regular Rate:Normal  Echo 06/2021  1. Left ventricular ejection fraction, by estimation, is 60 to 65%. Left ventricular ejection fraction by 3D volume is 64 %. The left ventricle has normal function. The left ventricle has no regional wall motion abnormalities. Left ventricular diastolic parameters were normal.   2. Right ventricular systolic function is normal. The right ventricular size is normal.   3. The mitral valve is normal in structure. Trivial mitral valve regurgitation. No evidence of mitral stenosis.   4. The aortic valve was not well visualized. Aortic valve regurgitation is mild. No aortic stenosis is present.    LHC 10/2019  There is mild to moderate left ventricular systolic dysfunction.  LV end diastolic pressure is normal.  The left ventricular ejection fraction is 35-45% by visual estimate.  There is no mitral valve regurgitation.   1. No angiographic evidence of CAD 2. Moderate segmental LV systolic dysfunction   It is not entirely clear what is causing her elevated troponin and chest pain. No evidence of coronary dissection. Excellent flow down all vessels with no atherosclerotic disease. This could represent a stress induced cardiomyopathy but generally we would not see a troponin level over  27,000 in the setting of stress induced cardiomyopathy. Possible coronary spasm or resolved thrombus. Also cannot exclude PE.   Admit to the ICU under PCCM service for continued management of DKA.     Neuro/Psych   Anxiety Depression    negative neurological ROS     GI/Hepatic ,GERD  Poorly Controlled,,(+)     substance abuse  alcohol use  Endo/Other  diabetes (glu 213), Type 1Hypothyroidism    Renal/GU Renal InsufficiencyRenal disease     Musculoskeletal  (+) Arthritis , Rheumatoid disorders,    Abdominal   Peds  Hematology negative hematology ROS (+) Hb 11.4, plt 140k   Anesthesia Other Findings   Reproductive/Obstetrics Last period 2019                             Anesthesia Physical Anesthesia Plan  ASA: 3  Anesthesia Plan: General   Post-op Pain Management: Ofirmev IV (intra-op)*   Induction:   PONV Risk Score and Plan: 4 or greater and Ondansetron, Treatment may vary due to age or medical condition and Midazolam  Airway Management Planned: Oral ETT  Additional Equipment: None  Intra-op Plan:   Post-operative Plan: Extubation in OR  Informed Consent: I have reviewed the patients History and Physical, chart, labs and discussed the procedure including the risks, benefits and alternatives for the proposed anesthesia with the patient or authorized representative who has indicated his/her understanding and acceptance.     Dental advisory given and Consent reviewed with POA  Plan Discussed with: CRNA and Surgeon  Anesthesia Plan Comments: (Discussed with patient, and also her mother by telephone )  Anesthesia Quick Evaluation

## 2023-09-21 NOTE — Progress Notes (Signed)
 OT Cancellation Note  Patient Details Name: Mileidy Atkin MRN: [id number] DOB: Jan 31, 1986   Cancelled Treatment:    Reason Eval/Treat Not Completed: Active bedrest order 6 hours post IR procedure starting at 11am 2/21.  Continue efforts as appropriate.    Hajer Dwyer D Jadis Pitter 09/21/2023, 10:58 AM 09/21/2023  RP, OTR/L  Acute Rehabilitation Services  Office:  [phone]

## 2023-09-21 NOTE — Anesthesia Postprocedure Evaluation (Signed)
 Anesthesia Post Note  Patient: Madison Williams  Procedure(s) Performed: SACRO-ILIAC PINNING (Left)     Patient location during evaluation: PACU Anesthesia Type: General Level of consciousness: patient cooperative, sedated and oriented Pain management: pain level controlled Vital Signs Assessment: post-procedure vital signs reviewed and stable Respiratory status: spontaneous breathing, nonlabored ventilation and respiratory function stable Cardiovascular status: blood pressure returned to baseline and stable Postop Assessment: no apparent nausea or vomiting Anesthetic complications: no   No notable events documented.  Last Vitals:  Vitals:   09/21/23 2145 09/21/23 2200  BP: (!) 147/89 (!) 142/83  Pulse: 64 68  Resp: (!) 27 (!) 28  Temp: 37.2 C   SpO2: 98% 94%    Last Pain:  Vitals:   09/21/23 2200  TempSrc:   PainSc: 6                  Siddhartha Hoback,E. Silviano Neuser

## 2023-09-21 NOTE — Transfer of Care (Signed)
 Immediate Anesthesia Transfer of Care Note  Patient: [name]  Procedure(s) Performed: SACRO-ILIAC PINNING (Left)  Patient Location: PACU  Anesthesia Type:General  Level of Consciousness: awake, alert , oriented, and patient cooperative  Airway & Oxygen Therapy: Patient Spontanous Breathing and Patient connected to nasal cannula oxygen  Post-op Assessment: Report given to RN and Post -op Vital signs reviewed and stable  Post vital signs: Reviewed and stable  Last Vitals:  Vitals Value Taken Time  BP 142/84 09/21/23 2135  Temp    Pulse 69 09/21/23 2142  Resp 24 09/21/23 2142  SpO2 95 % 09/21/23 2142  Vitals shown include unfiled device data.  Last Pain:  Vitals:   09/21/23 1700  TempSrc:   PainSc: Asleep         Complications: No notable events documented.
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
## 2023-09-22 LAB — BASIC METABOLIC PANEL
Anion gap: 18 — ABNORMAL HIGH (ref 5–15)
BUN: 16 mg/dL (ref 6–20)
CO2: 23 mmol/L (ref 22–32)
Calcium: 9 mg/dL (ref 8.9–10.3)
Chloride: 96 mmol/L — ABNORMAL LOW (ref 98–111)
Creatinine, Ser: 0.73 mg/dL (ref 0.44–1.00)
GFR, Estimated: >60 mL/min (ref >60)
Glucose, Bld: 210 mg/dL — ABNORMAL HIGH (ref 70–99)
Potassium: 3.9 mmol/L (ref 3.5–5.1)
Sodium: 137 mmol/L (ref 135–145)

## 2023-09-22 LAB — CBC
HCT: 32.5 % — ABNORMAL LOW (ref 36.0–46.0)
HCT: 30.3 % — ABNORMAL LOW (ref 36.0–46.0)
Hemoglobin: 10.8 g/dL — ABNORMAL LOW (ref 12.0–15.0)
Hemoglobin: 10 g/dL — ABNORMAL LOW (ref 12.0–15.0)
MCH: 27.1 pg (ref 26.0–34.0)
MCH: 27.1 pg (ref 26.0–34.0)
MCHC: 33.2 g/dL (ref 30.0–36.0)
MCHC: 33 g/dL (ref 30.0–36.0)
MCV: 81.5 fL (ref 80.0–100.0)
MCV: 82.1 fL (ref 80.0–100.0)
Platelets: 120 10*3/uL — ABNORMAL LOW (ref 150–400)
Platelets: 133 10*3/uL — ABNORMAL LOW (ref 150–400)
RBC: 3.99 MIL/uL (ref 3.87–5.11)
RBC: 3.69 MIL/uL — ABNORMAL LOW (ref 3.87–5.11)
RDW: 15.2 % (ref 11.5–15.5)
RDW: 14.9 % (ref 11.5–15.5)
WBC: 17.1 10*3/uL — ABNORMAL HIGH (ref 4.0–10.5)
WBC: 20.7 10*3/uL — ABNORMAL HIGH (ref 4.0–10.5)
nRBC: 1 % — ABNORMAL HIGH (ref 0.0–0.2)
nRBC: 0.8 % — ABNORMAL HIGH (ref 0.0–0.2)

## 2023-09-22 LAB — GLUCOSE, CAPILLARY
Glucose-Capillary: 210 mg/dL — ABNORMAL HIGH (ref 70–99)
Glucose-Capillary: 218 mg/dL — ABNORMAL HIGH (ref 70–99)
Glucose-Capillary: 236 mg/dL — ABNORMAL HIGH (ref 70–99)
Glucose-Capillary: 285 mg/dL — ABNORMAL HIGH (ref 70–99)
Glucose-Capillary: 259 mg/dL — ABNORMAL HIGH (ref 70–99)

## 2023-09-22 LAB — CALCIUM, IONIZED: Calcium, Ionized, Serum: 4.8 mg/dL (ref 4.5–5.6)

## 2023-09-22 MED ORDER — LACTATED RINGERS IV BOLUS
500.0000 mL | Freq: Once | INTRAVENOUS | Status: AC
  Administered 2023-09-22: 500 mL via INTRAVENOUS

## 2023-09-22 MED ORDER — INSULIN ASPART 100 UNIT/ML IJ SOLN
0.0000 [IU] | Freq: Three times a day (TID) | INTRAMUSCULAR | Status: DC
  Administered 2023-09-22: 8 [IU] via SUBCUTANEOUS
  Administered 2023-09-23: 3 [IU] via SUBCUTANEOUS
  Administered 2023-09-23: 8 [IU] via SUBCUTANEOUS
  Administered 2023-09-23: 3 [IU] via SUBCUTANEOUS
  Administered 2023-09-24: 8 [IU] via SUBCUTANEOUS
  Administered 2023-09-24 (×2): 5 [IU] via SUBCUTANEOUS
  Administered 2023-09-25: 8 [IU] via SUBCUTANEOUS
  Administered 2023-09-25: 15 [IU] via SUBCUTANEOUS
  Administered 2023-09-25: 8 [IU] via SUBCUTANEOUS
  Administered 2023-09-26: 2 [IU] via SUBCUTANEOUS
  Administered 2023-09-26: 15 [IU] via SUBCUTANEOUS

## 2023-09-22 MED ORDER — LACTATED RINGERS IV SOLN: INTRAVENOUS | Status: AC

## 2023-09-22 MED ORDER — CLONAZEPAM 1 MG PO TABS
1.0000 mg | ORAL_TABLET | Freq: Two times a day (BID) | ORAL | Status: DC
  Administered 2023-09-22 – 2023-10-03 (×22): 1 mg via ORAL
  Filled 2023-09-22 (×22): qty 1

## 2023-09-22 MED ORDER — INSULIN GLARGINE 100 UNIT/ML ~~LOC~~ SOLN
10.0000 [IU] | Freq: Every day | SUBCUTANEOUS | Status: DC
  Administered 2023-09-22: 10 [IU] via SUBCUTANEOUS
  Filled 2023-09-22 (×2): qty 0.1

## 2023-09-22 MED ORDER — INSULIN ASPART 100 UNIT/ML IJ SOLN
0.0000 [IU] | Freq: Every day | INTRAMUSCULAR | Status: DC
  Administered 2023-09-22 – 2023-09-24 (×2): 3 [IU] via SUBCUTANEOUS
  Administered 2023-09-25: 5 [IU] via SUBCUTANEOUS
  Administered 2023-09-26: 2 [IU] via SUBCUTANEOUS
  Administered 2023-09-27 – 2023-10-02 (×2): 3 [IU] via SUBCUTANEOUS

## 2023-09-22 MED ORDER — SODIUM CHLORIDE 0.9 % IV SOLN
25.0000 mg | Freq: Four times a day (QID) | INTRAVENOUS | Status: DC | PRN
  Administered 2023-09-22 – 2023-09-29 (×5): 25 mg via INTRAVENOUS
  Filled 2023-09-22 (×7): qty 1

## 2023-09-22 MED ORDER — CEFAZOLIN SODIUM-DEXTROSE 2-4 GM/100ML-% IV SOLN
2.0000 g | Freq: Three times a day (TID) | INTRAVENOUS | Status: AC
  Administered 2023-09-22 – 2023-09-23 (×3): 2 g via INTRAVENOUS
  Filled 2023-09-22 (×3): qty 100

## 2023-09-22 NOTE — Plan of Care (Signed)
  Problem: Education: Goal: Knowledge of General Education information will improve Description: Including pain rating scale, medication(s)/side effects and non-pharmacologic comfort measures Outcome: Progressing   Problem: Pain Managment: Goal: General experience of comfort will improve and/or be controlled Outcome: Not Progressing   Problem: Safety: Goal: Ability to remain free from injury will improve Outcome: Progressing
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
## 2023-09-22 NOTE — Progress Notes (Signed)
 OT Cancellation Note  Patient Details Name: Madison Williams MRN: [id number] DOB: 1986/01/01   Cancelled Treatment:    Reason Eval/Treat Not Completed: Medical issues which prohibited therapy. Pt on bedrest with possible surgery today.  Lindon Romp OT Acute Rehabilitation Services Office [phone]    Madison Williams 09/22/2023, 8:00 AM

## 2023-09-22 NOTE — Progress Notes (Signed)
 PT Cancellation Note  Patient Details Name: Madison Williams MRN: [id number] DOB: [birth year]   Cancelled Treatment:    Reason Eval/Treat Not Completed: Medical issues which prohibited therapy;Active bedrest order.  PT to check back daily. Thanks,  Corinna Capra, PT, DPT  Acute Rehabilitation Secure chat is best for contact #[phone] office      Dimas Aguas 09/22/2023, 8:45 AM
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
## 2023-09-23 LAB — CBC
HCT: 30.7 % — ABNORMAL LOW (ref 36.0–46.0)
Hemoglobin: 10 g/dL — ABNORMAL LOW (ref 12.0–15.0)
MCH: 26.8 pg (ref 26.0–34.0)
MCHC: 32.6 g/dL (ref 30.0–36.0)
MCV: 82.3 fL (ref 80.0–100.0)
Platelets: 137 10*3/uL — ABNORMAL LOW (ref 150–400)
RBC: 3.73 MIL/uL — ABNORMAL LOW (ref 3.87–5.11)
RDW: 14.6 % (ref 11.5–15.5)
WBC: 17.7 10*3/uL — ABNORMAL HIGH (ref 4.0–10.5)
nRBC: 0.4 % — ABNORMAL HIGH (ref 0.0–0.2)

## 2023-09-23 LAB — BASIC METABOLIC PANEL
Anion gap: 11 (ref 5–15)
BUN: 12 mg/dL (ref 6–20)
CO2: 25 mmol/L (ref 22–32)
Calcium: 8 mg/dL — ABNORMAL LOW (ref 8.9–10.3)
Chloride: 95 mmol/L — ABNORMAL LOW (ref 98–111)
Creatinine, Ser: 0.67 mg/dL (ref 0.44–1.00)
GFR, Estimated: >60 mL/min (ref >60)
Glucose, Bld: 248 mg/dL — ABNORMAL HIGH (ref 70–99)
Potassium: 4.3 mmol/L (ref 3.5–5.1)
Sodium: 131 mmol/L — ABNORMAL LOW (ref 135–145)

## 2023-09-23 LAB — GLUCOSE, CAPILLARY
Glucose-Capillary: 255 mg/dL — ABNORMAL HIGH (ref 70–99)
Glucose-Capillary: 158 mg/dL — ABNORMAL HIGH (ref 70–99)
Glucose-Capillary: 155 mg/dL — ABNORMAL HIGH (ref 70–99)
Glucose-Capillary: 179 mg/dL — ABNORMAL HIGH (ref 70–99)
Glucose-Capillary: 196 mg/dL — ABNORMAL HIGH (ref 70–99)

## 2023-09-23 MED ORDER — LEVOTHYROXINE SODIUM 50 MCG PO TABS
50.0000 ug | ORAL_TABLET | Freq: Every day | ORAL | Status: DC
  Administered 2023-09-23 – 2023-10-03 (×11): 50 ug via ORAL
  Filled 2023-09-23 (×11): qty 1

## 2023-09-23 MED ORDER — GABAPENTIN 300 MG PO CAPS
600.0000 mg | ORAL_CAPSULE | Freq: Three times a day (TID) | ORAL | Status: DC
  Administered 2023-09-23 – 2023-10-03 (×30): 600 mg via ORAL
  Filled 2023-09-23 (×32): qty 2

## 2023-09-23 MED ORDER — METHOCARBAMOL 750 MG PO TABS
750.0000 mg | ORAL_TABLET | Freq: Three times a day (TID) | ORAL | Status: DC
  Administered 2023-09-23 – 2023-09-24 (×2): 750 mg via ORAL
  Filled 2023-09-23 (×2): qty 1

## 2023-09-23 MED ORDER — INSULIN GLARGINE 100 UNIT/ML ~~LOC~~ SOLN
10.0000 [IU] | Freq: Two times a day (BID) | SUBCUTANEOUS | Status: DC
  Administered 2023-09-23 – 2023-09-24 (×3): 10 [IU] via SUBCUTANEOUS
  Filled 2023-09-23 (×5): qty 0.1

## 2023-09-23 MED ORDER — FLUOXETINE HCL 20 MG PO CAPS
20.0000 mg | ORAL_CAPSULE | Freq: Two times a day (BID) | ORAL | Status: DC
  Administered 2023-09-23 – 2023-09-24 (×3): 20 mg via ORAL
  Filled 2023-09-23 (×3): qty 1
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
## 2023-09-23 NOTE — Plan of Care (Signed)
  Problem: Clinical Measurements: Goal: Will remain free from infection Outcome: Progressing   Problem: Activity: Goal: Risk for activity intolerance will decrease Outcome: Progressing   Problem: Nutrition: Goal: Adequate nutrition will be maintained Outcome: Progressing   Problem: Elimination: Goal: Will not experience complications related to urinary retention Outcome: Progressing   Problem: Pain Managment: Goal: General experience of comfort will improve and/or be controlled Outcome: Progressing   Problem: Safety: Goal: Ability to remain free from injury will improve Outcome: Progressing   Problem: Skin Integrity: Goal: Risk for impaired skin integrity will decrease Outcome: Progressing

## 2023-09-24 ENCOUNTER — Encounter (HOSPITAL_COMMUNITY): Payer: Self-pay | Admitting: Orthopedic Surgery

## 2023-09-24 ENCOUNTER — Other Ambulatory Visit: Payer: Self-pay

## 2023-09-24 DIAGNOSIS — F32A Depression, unspecified: Secondary | ICD-10-CM | POA: Diagnosis not present

## 2023-09-24 DIAGNOSIS — T1490XA Injury, unspecified, initial encounter: Secondary | ICD-10-CM | POA: Diagnosis not present

## 2023-09-24 DIAGNOSIS — F19939 Other psychoactive substance use, unspecified with withdrawal, unspecified: Secondary | ICD-10-CM | POA: Diagnosis not present

## 2023-09-24 HISTORY — PX: IR ANGIOGRAM VISCERAL SELECTIVE: IMG657

## 2023-09-24 LAB — BASIC METABOLIC PANEL
Anion gap: 9 (ref 5–15)
BUN: 9 mg/dL (ref 6–20)
CO2: 25 mmol/L (ref 22–32)
Calcium: 7.7 mg/dL — ABNORMAL LOW (ref 8.9–10.3)
Chloride: 99 mmol/L (ref 98–111)
Creatinine, Ser: 0.69 mg/dL (ref 0.44–1.00)
GFR, Estimated: >60 mL/min (ref >60)
Glucose, Bld: 264 mg/dL — ABNORMAL HIGH (ref 70–99)
Potassium: 4.2 mmol/L (ref 3.5–5.1)
Sodium: 133 mmol/L — ABNORMAL LOW (ref 135–145)

## 2023-09-24 LAB — GLUCOSE, CAPILLARY
Glucose-Capillary: 110 mg/dL — ABNORMAL HIGH (ref 70–99)
Glucose-Capillary: 233 mg/dL — ABNORMAL HIGH (ref 70–99)
Glucose-Capillary: 237 mg/dL — ABNORMAL HIGH (ref 70–99)
Glucose-Capillary: 204 mg/dL — ABNORMAL HIGH (ref 70–99)
Glucose-Capillary: 300 mg/dL — ABNORMAL HIGH (ref 70–99)
Glucose-Capillary: 287 mg/dL — ABNORMAL HIGH (ref 70–99)

## 2023-09-24 LAB — CBC
HCT: 32.4 % — ABNORMAL LOW (ref 36.0–46.0)
Hemoglobin: 10.7 g/dL — ABNORMAL LOW (ref 12.0–15.0)
MCH: 27.4 pg (ref 26.0–34.0)
MCHC: 33 g/dL (ref 30.0–36.0)
MCV: 82.9 fL (ref 80.0–100.0)
Platelets: 167 10*3/uL (ref 150–400)
RBC: 3.91 MIL/uL (ref 3.87–5.11)
RDW: 14.2 % (ref 11.5–15.5)
WBC: 10.1 10*3/uL (ref 4.0–10.5)
nRBC: 0.2 % (ref 0.0–0.2)

## 2023-09-24 MED ORDER — POLYETHYLENE GLYCOL 3350 17 G PO PACK
17.0000 g | PACK | Freq: Every day | ORAL | Status: DC
  Administered 2023-09-25 – 2023-10-01 (×5): 17 g via ORAL
  Filled 2023-09-24 (×7): qty 1

## 2023-09-24 MED ORDER — ORAL CARE MOUTH RINSE: 15.0000 mL | OROMUCOSAL | Status: DC | PRN

## 2023-09-24 MED ORDER — ENOXAPARIN SODIUM 30 MG/0.3ML IJ SOSY
30.0000 mg | PREFILLED_SYRINGE | Freq: Two times a day (BID) | INTRAMUSCULAR | Status: DC
  Administered 2023-09-24 – 2023-10-01 (×15): 30 mg via SUBCUTANEOUS
  Filled 2023-09-24 (×14): qty 0.3

## 2023-09-24 MED ORDER — LORAZEPAM 1 MG PO TABS
2.0000 mg | ORAL_TABLET | Freq: Three times a day (TID) | ORAL | Status: DC | PRN
  Administered 2023-09-24 – 2023-10-02 (×9): 2 mg via ORAL
  Filled 2023-09-24 (×9): qty 2

## 2023-09-24 MED ORDER — INSULIN GLARGINE 100 UNIT/ML ~~LOC~~ SOLN
12.0000 [IU] | Freq: Two times a day (BID) | SUBCUTANEOUS | Status: DC
  Administered 2023-09-24 – 2023-09-25 (×2): 12 [IU] via SUBCUTANEOUS
  Filled 2023-09-24 (×3): qty 0.12

## 2023-09-24 MED ORDER — METHOCARBAMOL 500 MG PO TABS
1000.0000 mg | ORAL_TABLET | Freq: Three times a day (TID) | ORAL | Status: DC
Start: 2023-09-24 — End: 2023-09-24

## 2023-09-24 MED ORDER — BACLOFEN 10 MG PO TABS
5.0000 mg | ORAL_TABLET | Freq: Three times a day (TID) | ORAL | Status: DC
  Administered 2023-09-29 – 2023-10-02 (×10): 5 mg via ORAL
  Filled 2023-09-24 (×11): qty 1

## 2023-09-24 MED ORDER — ENSURE ENLIVE PO LIQD
237.0000 mL | Freq: Two times a day (BID) | ORAL | Status: DC
  Administered 2023-09-24 – 2023-09-25 (×4): 237 mL via ORAL

## 2023-09-24 MED ORDER — BACLOFEN 10 MG PO TABS
10.0000 mg | ORAL_TABLET | Freq: Three times a day (TID) | ORAL | Status: AC
  Administered 2023-09-24 – 2023-09-29 (×15): 10 mg via ORAL
  Filled 2023-09-24 (×15): qty 1

## 2023-09-24 NOTE — Inpatient Diabetes Management (Signed)
 Inpatient Diabetes Program Recommendations  AACE/ADA: New Consensus Statement on Inpatient Glycemic Control (2015)  Target Ranges:  Prepandial:   less than 140 mg/dL      Peak postprandial:   less than 180 mg/dL (1-2 hours)      Critically ill patients:  140 - 180 mg/dL   Lab Results  Component Value Date   GLUCAP 204 (H) 09/24/2023   HGBA1C 10.2 (A) 09/18/2023    Review of Glycemic Control  Latest Reference Range & Units 09/23/23 07:08 09/23/23 11:20 09/23/23 15:56 09/23/23 20:30 09/23/23 23:24 09/24/23 03:13 09/24/23 07:39 09/24/23 11:10  Glucose-Capillary 70 - 99 mg/dL 045 (H) 409 (H) 811 (H) 179 (H) 196 (H) 233 (H) 237 (H) 204 (H)  (H): Data is abnormally high  Diabetes history: DM1(does not make insulin.  Needs correction, basal and meal coverage)  Outpatient Diabetes medications: Lantus 32 units every day, Humalog 3-6 units TID, correction factor-1 unit for every 40 mg/dL > 914 mg/dL (per endocrinology note on 2/18)  Current orders for Inpatient glycemic control: Semglee 12 units BID, Novolog 0-15 units TID and 0-5 units QHS  Met with patient briefly at bedside.  She is very drowsy and unable to have a conversation at this time.  Appears she is pursuing CIR.  She is current with Dr. Lonzo Cloud for endocrinology.  Last visit was 09/18/23.  She has been prescribed the Dexcom and the Omnipod but has not started.    Will speak with her regarding her A1C of 10.2% at a later time.    Will continue to follow while inpatient.  Thank you, Dulce Sellar, MSN, CDCES Diabetes Coordinator Inpatient Diabetes Program [phone] (team pager from 8a-5p)
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
## 2023-09-24 NOTE — TOC Progression Note (Signed)
 Transition of Care [facility]) - Progression Note    Patient Details  Name: Madison Williams MRN: [id number] Date of Birth: 04-May-1986  Transition of Care [facility]) CM/SW Contact  Glennon Mac, RN Phone Number: 09/24/2023, 4:45 PM  Clinical Narrative:    PT/OT recommending CIR and consult in progress.  Patient interested in pursuing rehab admission, and family can provide 24h assistance at dc.  CIR admissions coordinator to follow for progress with therapies as patient very limited by pain.  Will follow.      Barriers to Discharge: Continued Medical Work up  Expected Discharge Plan and Services   Discharge Planning Services: CM Consult                                           Social Determinants of Health (SDOH) Interventions SDOH Screenings   Food Insecurity: Patient Unable To Answer (09/22/2023)  Recent Concern: Food Insecurity - Food Insecurity Present (09/15/2023)  Housing: Patient Unable To Answer (09/22/2023)  Recent Concern: Housing - High Risk (06/24/2023)  Transportation Needs: Unknown (09/22/2023)  Recent Concern: Transportation Needs - Unmet Transportation Needs (09/15/2023)  Utilities: Patient Unable To Answer (09/22/2023)  Depression (PHQ2-9): High Risk (09/18/2023)  Financial Resource Strain: High Risk (09/15/2023)  Physical Activity: Unknown (09/15/2023)  Social Connections: Unknown (09/15/2023)  Stress: Stress Concern Present (09/15/2023)  Tobacco Use: Medium Risk (09/21/2023)    Readmission Risk Interventions     No data to display         Quintella Baton, RN, BSN  Trauma/Neuro ICU Case Manager [phone]

## 2023-09-24 NOTE — Progress Notes (Addendum)
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
## 2023-09-24 NOTE — Progress Notes (Addendum)
 Inpatient Rehab Admissions:  Inpatient Rehab Consult received.  I met with patient at the bedside for rehabilitation assessment and to discuss goals and expectations of an inpatient rehab admission.  Discussed average length of stay, insurance authorization and discharge home after completion of CIR. Pt acknowledged understanding. Pt interested in pursuing CIR. Pt gave permission to contact mother Harvin Hazel. Spoke with Harvin Hazel on the telephone. She also acknowledged understanding of  CIR goals and expectations. She is supportive of pt pursuing CIR. She confirmed that she  and other family will be able to provide 24/7 support for pt after discharge. Will continue to follow participation and progress with therapies.  Signed: Wolfgang Phoenix, MS, CCC-SLP Admissions Coordinator [phone]

## 2023-09-25 DIAGNOSIS — F19939 Other psychoactive substance use, unspecified with withdrawal, unspecified: Secondary | ICD-10-CM

## 2023-09-25 DIAGNOSIS — T1490XA Injury, unspecified, initial encounter: Secondary | ICD-10-CM | POA: Diagnosis not present

## 2023-09-25 DIAGNOSIS — F32A Depression, unspecified: Secondary | ICD-10-CM

## 2023-09-25 LAB — GLUCOSE, CAPILLARY
Glucose-Capillary: 256 mg/dL — ABNORMAL HIGH (ref 70–99)
Glucose-Capillary: 266 mg/dL — ABNORMAL HIGH (ref 70–99)
Glucose-Capillary: 316 mg/dL — ABNORMAL HIGH (ref 70–99)
Glucose-Capillary: 377 mg/dL — ABNORMAL HIGH (ref 70–99)
Glucose-Capillary: 415 mg/dL — ABNORMAL HIGH (ref 70–99)
Glucose-Capillary: 522 mg/dL (ref 70–99)
Glucose-Capillary: 543 mg/dL (ref 70–99)

## 2023-09-25 LAB — BASIC METABOLIC PANEL
Anion gap: 8 (ref 5–15)
Anion gap: 10 (ref 5–15)
BUN: 9 mg/dL (ref 6–20)
BUN: 14 mg/dL (ref 6–20)
CO2: 27 mmol/L (ref 22–32)
CO2: 24 mmol/L (ref 22–32)
Calcium: 7.9 mg/dL — ABNORMAL LOW (ref 8.9–10.3)
Calcium: 8.2 mg/dL — ABNORMAL LOW (ref 8.9–10.3)
Chloride: 98 mmol/L (ref 98–111)
Chloride: 98 mmol/L (ref 98–111)
Creatinine, Ser: 0.56 mg/dL (ref 0.44–1.00)
Creatinine, Ser: 0.62 mg/dL (ref 0.44–1.00)
GFR, Estimated: >60 mL/min (ref >60)
GFR, Estimated: >60 mL/min (ref >60)
Glucose, Bld: 258 mg/dL — ABNORMAL HIGH (ref 70–99)
Glucose, Bld: 393 mg/dL — ABNORMAL HIGH (ref 70–99)
Potassium: 4 mmol/L (ref 3.5–5.1)
Potassium: 3.8 mmol/L (ref 3.5–5.1)
Sodium: 133 mmol/L — ABNORMAL LOW (ref 135–145)
Sodium: 132 mmol/L — ABNORMAL LOW (ref 135–145)

## 2023-09-25 LAB — CBC
HCT: 30.1 % — ABNORMAL LOW (ref 36.0–46.0)
Hemoglobin: 10 g/dL — ABNORMAL LOW (ref 12.0–15.0)
MCH: 27.2 pg (ref 26.0–34.0)
MCHC: 33.2 g/dL (ref 30.0–36.0)
MCV: 81.8 fL (ref 80.0–100.0)
Platelets: 207 10*3/uL (ref 150–400)
RBC: 3.68 MIL/uL — ABNORMAL LOW (ref 3.87–5.11)
RDW: 13.9 % (ref 11.5–15.5)
WBC: 8.5 10*3/uL (ref 4.0–10.5)
nRBC: 0 % (ref 0.0–0.2)

## 2023-09-25 LAB — BRAIN NATRIURETIC PEPTIDE: B Natriuretic Peptide: 53 pg/mL (ref 0.0–100.0)

## 2023-09-25 MED ORDER — OXYCODONE HCL 5 MG PO TABS
5.0000 mg | ORAL_TABLET | Freq: Four times a day (QID) | ORAL | Status: DC | PRN
  Administered 2023-09-25: 5 mg via ORAL
  Filled 2023-09-25: qty 1

## 2023-09-25 MED ORDER — QUETIAPINE FUMARATE 50 MG PO TABS
50.0000 mg | ORAL_TABLET | Freq: Every day | ORAL | Status: DC
Start: 2023-09-25 — End: 2023-09-27
  Administered 2023-09-25 – 2023-09-26 (×2): 50 mg via ORAL
  Filled 2023-09-25 (×2): qty 1

## 2023-09-25 MED ORDER — HALOPERIDOL LACTATE 5 MG/ML IJ SOLN: 5.0000 mg | Freq: Four times a day (QID) | INTRAMUSCULAR | Status: DC | PRN

## 2023-09-25 MED ORDER — INSULIN GLARGINE 100 UNIT/ML ~~LOC~~ SOLN
15.0000 [IU] | Freq: Two times a day (BID) | SUBCUTANEOUS | Status: DC
  Administered 2023-09-25: 15 [IU] via SUBCUTANEOUS
  Filled 2023-09-25 (×3): qty 0.15
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
## 2023-09-25 NOTE — Progress Notes (Signed)
 Date and time results received: 09/25/23 2229  Test: CBG Critical Value: 415  Name of Provider Notified: Dr. Derrell Lolling  Orders Received? Give 5 units and recheck in an hour

## 2023-09-25 NOTE — Progress Notes (Signed)
 Date and time results received: 09/25/23 2105 (use smartphrase ".now" to insert current time)  Test: CBG Critical Value: 522  Name of Provider Notified: Dr. Derrell Lolling  Orders Received?  Give 5 units with scheduled Lantus. BMP lab draw. Recheck CBG in an hour.

## 2023-09-25 NOTE — Progress Notes (Signed)
 Rn to pt room. Pt in bed tearful, moaning, and stating " I am in pain". Pt proceeds to say "I can't handle this pain, I want to just die". RN asked pt directly if pt wants to kill or harm herself, pt stated "No, I don't want to harm myself, I just can't handle this pain". Barnetta Chapel, PA notified and aware. No new orders received. PRN IV dilaudid given.

## 2023-09-25 NOTE — Progress Notes (Signed)
  Inpatient Rehabilitation Admissions Coordinator   I met with patient at bedside. Her current mobility progress is limited by her pain per patient. I will follow. I will contact her spouse to discuss if she is covered by spouse's insurance.   Ottie Glazier, RN, MSN Rehab Admissions Coordinator [phone] 09/25/2023 10:05 AM
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
## 2023-09-25 NOTE — Plan of Care (Signed)
  Problem: Education: Goal: Knowledge of General Education information will improve Description: Including pain rating scale, medication(s)/side effects and non-pharmacologic comfort measures Outcome: Progressing   Problem: Clinical Measurements: Goal: Will remain free from infection Outcome: Progressing Goal: Respiratory complications will improve Outcome: Progressing Goal: Cardiovascular complication will be avoided Outcome: Progressing   Problem: Nutrition: Goal: Adequate nutrition will be maintained Outcome: Progressing   Problem: Elimination: Goal: Will not experience complications related to urinary retention Outcome: Progressing   Problem: Coping: Goal: Level of anxiety will decrease Outcome: Not Progressing   Problem: Pain Managment: Goal: General experience of comfort will improve and/or be controlled Outcome: Not Progressing

## 2023-09-25 NOTE — Inpatient Diabetes Management (Signed)
 Inpatient Diabetes Program Recommendations  AACE/ADA: New Consensus Statement on Inpatient Glycemic Control (2015)  Target Ranges:  Prepandial:   less than 140 mg/dL      Peak postprandial:   less than 180 mg/dL (1-2 hours)      Critically ill patients:  140 - 180 mg/dL   Lab Results  Component Value Date   GLUCAP 266 (H) 09/25/2023   HGBA1C 10.2 (A) 09/18/2023    Review of Glycemic Control  Latest Reference Range & Units 09/24/23 07:39 09/24/23 11:10 09/24/23 16:09 09/24/23 20:57 09/25/23 07:48 09/25/23 11:11  Glucose-Capillary 70 - 99 mg/dL 604 (H) 540 (H) 981 (H) 287 (H) 256 (H) 266 (H)   Diabetes history: DM 1 Outpatient Diabetes medications: Lantus 32 units Daily, Humalog 0-30 units tid  Current orders for Inpatient glycemic control:  Lantus 15 units bid Novolog 0-15 units tid + hs  Ensure enlive bid between meals (40 grams of carbohydrates) A1c 10.2% 2/18  -   Start Novolog 4 units tid meal coverage if eating >50% of meals  Spoke with pt at bedside regarding A1c and glucose control while in the hospital/rehab/home. Pt had an appointment on 2/26 for insulin pump training. Called [facility] to inform them pt is in the hospital per pt request and she will call them for available time in the future for follow up. Pt had general questions about insulin pump and glucose control in the future. Will follow glucose trends.   Thanks,  Christena Deem RN, MSN, BC-ADM Inpatient Diabetes Coordinator Team Pager [phone] (8a-5p)
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
## 2023-09-26 ENCOUNTER — Ambulatory Visit: Payer: Medicaid Other | Admitting: Nutrition

## 2023-09-26 LAB — GLUCOSE, CAPILLARY
Glucose-Capillary: 114 mg/dL — ABNORMAL HIGH (ref 70–99)
Glucose-Capillary: 352 mg/dL — ABNORMAL HIGH (ref 70–99)
Glucose-Capillary: 127 mg/dL — ABNORMAL HIGH (ref 70–99)
Glucose-Capillary: 41 mg/dL — CL (ref 70–99)
Glucose-Capillary: 96 mg/dL (ref 70–99)
Glucose-Capillary: 201 mg/dL — ABNORMAL HIGH (ref 70–99)

## 2023-09-26 MED ORDER — INSULIN GLARGINE 100 UNIT/ML ~~LOC~~ SOLN
15.0000 [IU] | Freq: Two times a day (BID) | SUBCUTANEOUS | Status: DC
  Administered 2023-09-26 – 2023-09-29 (×8): 15 [IU] via SUBCUTANEOUS
  Filled 2023-09-26 (×9): qty 0.15

## 2023-09-26 MED ORDER — INSULIN GLARGINE 100 UNIT/ML ~~LOC~~ SOLN
20.0000 [IU] | Freq: Two times a day (BID) | SUBCUTANEOUS | Status: DC
  Filled 2023-09-26 (×2): qty 0.2

## 2023-09-26 MED ORDER — MIRTAZAPINE 15 MG PO TABS
15.0000 mg | ORAL_TABLET | Freq: Every day | ORAL | Status: DC
  Administered 2023-09-26 – 2023-10-02 (×7): 15 mg via ORAL
  Filled 2023-09-26 (×7): qty 1

## 2023-09-26 MED ORDER — INSULIN ASPART 100 UNIT/ML IJ SOLN
5.0000 [IU] | Freq: Three times a day (TID) | INTRAMUSCULAR | Status: DC
Start: 2023-09-26 — End: 2023-09-27
  Administered 2023-09-26 – 2023-09-27 (×3): 5 [IU] via SUBCUTANEOUS

## 2023-09-26 MED ORDER — HYDROXYZINE HCL 25 MG PO TABS
25.0000 mg | ORAL_TABLET | Freq: Three times a day (TID) | ORAL | Status: DC | PRN
  Administered 2023-09-26: 25 mg via ORAL
  Filled 2023-09-26 (×2): qty 1

## 2023-09-26 MED ORDER — GLUCERNA SHAKE PO LIQD
237.0000 mL | Freq: Two times a day (BID) | ORAL | Status: DC
  Administered 2023-09-26 – 2023-10-03 (×11): 237 mL via ORAL
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
## 2023-09-26 NOTE — Discharge Instructions (Addendum)
 Orthopedics: Weightbearing to the Right Lower Extremity for transfers only, Nonweightbearing to  Left Lower Extremity.  Please take Eliquis for 18 more days.  Once you have finished this, please start ASA 81mg  for 30 more days.  Then you have no further medications to take like this.  [employer] assistance programs Crisis assistance programs  -[facility] Arts development officer. If you are experiencing homelessness in [place], [place], your first point of contact should be Pensions consultant. You can reach Coordinated Entry by calling [phone] or by emailing [email or web address].  Community access points: [employer] [phone] [address], [place]) every Tuesday from 9am-10am. [employer] ([address] [place]) every Wednesday from 8am-9am.   -[place] Coordinated Re-entry Marcy Panning: Dial 211 and request. Offers referrals to homeless shelters in the area.    -The [employer] 727-430-1992) offers several services to local families, as funding allows. The Emergency Assistance Program (EAP), which they administer, provides household goods, free food, clothing, and financial aid to people in need in the [facility] area. The EAP program does have some qualification, and counselors will interview clients for financial assistance by written referral only. Referrals need to be made by the Department of Social Services or by other EAP approved human services agencies or charities in the area.  -[facility] of [employer], which can be reached at [phone], offers emergency assistance programs for those in need of help, such as food, rent assistance, a soup kitchen, shelter, and clothing. They are based in [facility] but provide a number of services to those that qualify for assistance.   [facility] Department of Social Services may be able to offer temporary financial  assistance and cash grants for paying rent and utilities, Help may be provided for local county residents who may be experiencing personal crisis when other resources, including government programs, are not available. Call [phone]  -High [employer] is a [employer] agency, The organization can offer emergency assistance for paying rent, Caremark Rx, utilities, food, household products and furniture. They offer extensive emergency and transitional housing for families, children and single women, and also run a Boy's and [employer]. Thrift Shops, Secondary school teacher, and other aid offered too. [address] [place], [place] [address], [phone]  -[place] Low Income Energy Assistance Program -- This is offered for [facility] families. The federal government created [employer] Program provides a one-time cash grant payment to help eligible low-income families pay their electric and heating bills. [address] [place], [place] [address], [phone]  -[place] Emergency Assistance -- A program offers emergency utility and rent funds for greater [employer] area residents. The program can also provide counseling and referrals to charities and government programs. Also provides food and a free meal program that serves lunch Mondays - Saturdays and dinner seven days per week to individuals in the community. [address] [place], [place] [address], [phone]  -[employer] - Offers affordable apartment and housing communities across      [place] and [place]. The low income and seniors can access public housing, rental assistance to qualified applicants, and apply for the section 8 rent subsidy program. Other programs include Chiropractor and Engineer, maintenance. [address] [place], [place] [address], dial [phone].  -The Servant  Center provides transitional housing to veterans and the  disabled. Clients will also access other services too, including assistance in applying for Disability, life skills classes, case management, and assistance in finding permanent housing. [address] [place], [place] [address], call [phone]  -[facility] [place] Transitional Housing through [employer] is for people who were just evicted or that are formerly homeless. The non-profit will also help then gain self-sufficiency, find a home or apartment to live in, and also provides information on rent assistance when needed. Phone [phone]  -The [place] [employer] [facility] helps low income, elderly, or disabled residents in seven counties in the [place] ([place], [place], [place], [place], [place], [place], [place], and [place]) save energy and reduce their utility bills by improving energy efficiency. Phone [phone].  -[employer] is located in the [place] [facility] in the [employer], [address], [place], [place] [address]. Parking is in the rear of the building. Phone: [phone]   General Email: Haskel Schroeder  GHC provides free housing counseling assistance in locating affordable rental housing or housing with support services for families and individuals in crisis and the chronically homeless. We provide potential resources for other housing needs like utilities. Our trained counselors also work with clients on budgeting and financial literacy in effort to empower them to take control of their financial situations. [employer] collaborates with homeless service providers and other stakeholders as part of the [employer] COC (Continuum of Care). The (COC) is a regional/local planning body that coordinates housing and services funding for homeless families and individuals. The role of  GHC in the COC is through housing counseling to work with people we serve on diversion strategies for those that are at imminent risk of becoming homeless. We also work with the Coordinated Assessment/Entry Specialist who attempts to find temporary solutions and/or connects the people to Housing First, Rapid Re-housing or transitional housing programs. Our Homelessness Prevention Housing Counselors meet with clients on business days (Monday-Fridays, except scheduled holidays) from 8:30 am to 4:30 pm.  Legal assistance for evictions, foreclosure, and more -If you need free legal advice on civil issues, such as foreclosures, evictions, Electronics engineer, government programs, domestic issues and more, Armed forces operational officer Aid of [facility]) is a Associate Professor firm that provides free legal services and counsel to lower income people, seniors, disabled, and others, The goal is to ensure everyone has access to justice and fair representation. Call them at [phone].  [facility] for Housing and Community Studies can provide info about obtaining legal assistance with evictions. Phone [phone].  Data processing manager  The [employer], [employer]. offers job and Dispensing optician. Resources are focused on helping students obtain the skills and experiences that are necessary to compete in today's challenging and tight job market. The non-profit faith-based community action agency offers internship trainings as well as classroom instruction. Classes are tailored to meet the needs of people in the [facility] region. [place], [place] [address], [phone]  Foreclosure prevention/Debt Services Family Services of the [employer] Credit Counseling Service inludes debt and foreclosure prevention programs for local families. This includes money management, financial advice, budget review and development of a written action plan with a Pensions consultant  to help solve specific individual financial problems. In addition, housing and mortgage counselors can also provide pre- and post-purchase homeownership counseling, default resolution counseling (to prevent foreclosure) and reverse mortgage counseling. A Debt Management Program allows people and families with a high level of  credit card or medical debt to consolidate and repay consumer debt and loans to creditors and rebuild positive credit ratings and scores. Contact (336) [phone].  Community clinics in [place] -Health Department [facility]: [address] [place], [place], [address]. [phone].  -Health Department [place] [facility]: [address], [facility] [place], [address]. [phone].  -[facility] Network offers medical care through a group of doctors, pharmacies and other healthcare related agencies that offer services for low income, uninsured adults in [place]. Also offers adult Dental care and assistance with applying for an [employer]. Call [phone].   [facility]. This center provides low-cost health care to those without health insurance. Services offered include an onsite pharmacy. Phone [phone]. [address], [place].  -Medication Assistance Program serves as a link between pharmaceutical companies and patients to provide low cost or free prescription medications. This service is available for residents who meet certain income restrictions and have no insurance coverage. PLEASE CALL [phone] Madison Williams) OR [phone] ([place])  -One Step Further: Materials engineer, The [employer] Support & Nutrition Program, [employer]. Call ([phone]/ [phone].  Food pantry and assistance -[facility]: [address], [place] [address]. Phone [phone]  -[facility] Food Pantry: [address] [place], [place] [address]. [phone].  -Missionary Ministry: has the purpose of visiting the sick and shut-ins and provide for needs in the surrounding communities. Call [phone]. Email: [email or web address]@gmail .com This program provides: Food box for seniors, Financial assistance, Food to meet basic nutritional needs.  -Meals on Wheels with Senior Resources: [facility] residents age 50 and over who are homebound and unable to obtain and prepare a nutritious meal for themselves are eligible for this service. There may be a waiting list in certain parts of [facility] if the route in that area is full. If you are in [facility] and [place] call [phone] to register. For all other areas call [phone] to register.  -Greater Dietitian: https://findfood.BargainContractor.si  TRANSPORTATION: -[employer] Department of Health: Call [facility] and [employer] at ([phone] for details. AttractionGuides.es  -Access [place]: Access [place] is the [employer] Agency's shared-ride transportation service for eligible riders who have a disability that prevents them from riding the fixed route bus. Call ([phone]. Access [place] riders must pay a fare of $1.50 per trip, or may purchase a 10-ride punch card for $14.00 ($1.40 per ride) or a 40-ride punch card for $48.00 ($1.20 per ride).  -The Shepherd's WHEELS rideshare transportation service is provided for senior citizens (60+) who live independently within [place] city limits and are unable to drive or have limited access to transportation. Call [phone] to schedule an appointment.  -[place] Transportation: For Medicare or Medicaid recipients call [phone]?[facility], wheelchair Zenaida Niece, and ambulatory quotes available.   FLEEING VIOLENCE: -Family Services of the [place]- 24/7 Crisis line [phone]) -[facility] Justice Centers:  (336) [phone]-[phone] [phone])  [place] 2-1-1 is another useful way to locate resources in the community. Visit ShedSizes.ch to find service information online. If you need additional assistance, 2-1-1 Referral Specialists are available 24 hours a day, every day by dialing 2-1-1 or [phone] from any phone. The call is free, confidential, and available in any language.  Affordable Housing Search http://www.nchousingsearch.org  [facility])   M-F 8a-3p [address] [place], [place] [address] [phone] Services include: laundry, barbering, support  groups, case management, phone & computer access, showers, AA/NA mtgs, mental health/substance abuse nurse, job skills class, disability information, VA assistance, spiritual classes, etc. Winter Shelter available when temperatures are less than 32 degrees.   HOMELESS SHELTERS [facility] at [facility]- Call [phone] ext. [phone] or ext. 336. Located at [address] [place], [place] [address]  [facility] Mens Shelter- Call [phone]. Located at [address], [place] [address].  [facility] [employer]. Call [phone]. Office located at [address] [place] [address].  [facility] Family Housing through [place] [phone].  [facility]- Call [phone]. Located at [address] [place], [place], [place] [address].  [facility] the [facility]-For Pregnant mothers. Call [phone]. Located at [address] [place], [address].  [facility]-For men in [place]. Call [phone]. [facility]ter in [place]. Call [phone].  Home of [employer] for [employer] [phone]. Office located at [address] [place], [address].  [employer] be agreeable to help with chores. Call [phone] ext. [phone].  Men's: [address] [place], [place]  [address]. Women's: [facility]  [address] [place], [place] [address]  Crisis Services [facility] Mobile Crisis Management- [phone]  [facility] [address] [place], [place] [address]. Phone: [phone]

## 2023-09-26 NOTE — Progress Notes (Signed)
 Inpatient Rehabilitation Admissions Coordinator   I spoke with patient's spouse by phone. He lives in [place] and unable to assist in her care. Is visiting. He applied 2 days ago for [facility] for her as he is a disabled Cytogeneticist. I spoke with her Mom by phone. She works and patient would have to reach Mod I at wheelchair level to return home with 8 step entry. I discussed with Mom the need to get a ramp for entry into the home at wheelchair level. Renting a ramp as an option. 56 yo grandmother in the home who uses a cane and has dementia. Grandmother can provide intermittent supervision only. I await further progress with therapy before determining rehab venue options.  Ottie Glazier, RN, MSN Rehab Admissions Coordinator [phone] 09/26/2023 10:53 AM

## 2023-09-26 NOTE — TOC Progression Note (Addendum)
 Transition of Care [facility]) - Progression Note    Patient Details  Name: Madison Williams MRN: [id number] Date of Birth: 1986/03/08  Transition of [facility]) CM/SW Contact  Marliss Coots, LCSW Phone Number: 09/26/2023, 2:12 PM  Clinical Narrative:     2:12 PM Per chart review, CIR if following patient for potential admission.  3:36 PM CSW informed patient of TOC consult (substance use education/counseling and offered to provide resources. Patient informed CSW that she is connected with outpatient services already and declined CSW offer. Per bedside RN, patient expressed interest in financial assistance resources. CSW added requested resources to AVS.  Expected Discharge Plan: IP Rehab Facility Barriers to Discharge: Continued Medical Work up  Expected Discharge Plan and Services   Discharge Planning Services: CM Consult                                           Social Determinants of Health (SDOH) Interventions SDOH Screenings   Food Insecurity: Patient Unable To Answer (09/22/2023)  Recent Concern: Food Insecurity - Food Insecurity Present (09/15/2023)  Housing: Patient Unable To Answer (09/22/2023)  Recent Concern: Housing - High Risk (06/24/2023)  Transportation Needs: Unknown (09/22/2023)  Recent Concern: Transportation Needs - Unmet Transportation Needs (09/15/2023)  Utilities: Patient Unable To Answer (09/22/2023)  Depression (PHQ2-9): High Risk (09/18/2023)  Financial Resource Strain: High Risk (09/15/2023)  Physical Activity: Unknown (09/15/2023)  Social Connections: Unknown (09/15/2023)  Stress: Stress Concern Present (09/15/2023)  Tobacco Use: Medium Risk (09/21/2023)    Readmission Risk Interventions     No data to display
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
## 2023-09-26 NOTE — Inpatient Diabetes Management (Addendum)
 Inpatient Diabetes Program Recommendations  AACE/ADA: New Consensus Statement on Inpatient Glycemic Control (2015)  Target Ranges:  Prepandial:   less than 140 mg/dL      Peak postprandial:   less than 180 mg/dL (1-2 hours)      Critically ill patients:  140 - 180 mg/dL   Lab Results  Component Value Date   GLUCAP 114 (H) 09/26/2023   HGBA1C 10.2 (A) 09/18/2023    Review of Glycemic Control  Latest Reference Range & Units 09/24/23 07:39 09/24/23 11:10 09/24/23 16:09 09/24/23 20:57 09/25/23 07:48 09/25/23 11:11  Glucose-Capillary 70 - 99 mg/dL 295 (H) 284 (H) 132 (H) 287 (H) 256 (H) 266 (H)   Diabetes history: DM 1 Outpatient Diabetes medications: Lantus 32 units Daily, Humalog 0-30 units tid  Current orders for Inpatient glycemic control:  Lantus 15 units bid Novolog 0-15 units tid + hs  Ensure enlive bid between meals (40 grams of carbohydrates) A1c 10.2% 2/18  -   Start Novolog 5 units tid meal coverage if eating >50% of meals -   Reduce Lantus back down to 15 units bid -   dietitian consult to reassess supplement options with potentially less carbohydrate content in it.  Pt has type 1 diabetes and requires carbohydrate coverage with insulin. Pt is on Ensure Enlive which has 40 grams of carbohydrate in it and spiked her glucose up into the 500 range overnight. Fasting glucose back down to 114 on 15 units of basal insulin.   Thanks,  Christena Deem RN, MSN, BC-ADM Inpatient Diabetes Coordinator Team Pager [phone] (8a-5p)
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
## 2023-09-26 NOTE — Progress Notes (Addendum)
 Hypoglycemic Event  CBG: 41   Treatment: 8 oz juice/soda  Symptoms: Sweaty  Follow-up CBG: Time:1725 CBG Result:96  Possible Reasons for Event: Medication regimen:    Comments/MD notified: Diabetes Coordinator Notified    Balinda Quails 09/26/2023 5:26 PM

## 2023-09-26 NOTE — Plan of Care (Signed)

## 2023-09-27 DIAGNOSIS — F32A Depression, unspecified: Secondary | ICD-10-CM | POA: Diagnosis not present

## 2023-09-27 DIAGNOSIS — F19939 Other psychoactive substance use, unspecified with withdrawal, unspecified: Secondary | ICD-10-CM | POA: Diagnosis not present

## 2023-09-27 DIAGNOSIS — T1490XA Injury, unspecified, initial encounter: Secondary | ICD-10-CM | POA: Diagnosis not present

## 2023-09-27 LAB — GLUCOSE, CAPILLARY
Glucose-Capillary: 115 mg/dL — ABNORMAL HIGH (ref 70–99)
Glucose-Capillary: 289 mg/dL — ABNORMAL HIGH (ref 70–99)
Glucose-Capillary: 151 mg/dL — ABNORMAL HIGH (ref 70–99)
Glucose-Capillary: 258 mg/dL — ABNORMAL HIGH (ref 70–99)

## 2023-09-27 MED ORDER — INSULIN ASPART 100 UNIT/ML IJ SOLN
4.0000 [IU] | Freq: Three times a day (TID) | INTRAMUSCULAR | Status: DC
  Administered 2023-09-27 – 2023-09-30 (×7): 4 [IU] via SUBCUTANEOUS

## 2023-09-27 MED ORDER — OXYCODONE HCL 5 MG PO TABS
15.0000 mg | ORAL_TABLET | ORAL | Status: DC | PRN
  Administered 2023-09-27 – 2023-09-30 (×13): 20 mg via ORAL
  Filled 2023-09-27 (×13): qty 4

## 2023-09-27 MED ORDER — QUETIAPINE 12.5 MG HALF TABLET
12.5000 mg | ORAL_TABLET | Freq: Two times a day (BID) | ORAL | Status: DC | PRN
  Administered 2023-10-02: 12.5 mg via ORAL
  Filled 2023-09-27 (×2): qty 1

## 2023-09-27 MED ORDER — QUETIAPINE FUMARATE 50 MG PO TABS
50.0000 mg | ORAL_TABLET | Freq: Every day | ORAL | Status: DC
  Administered 2023-09-27 – 2023-10-02 (×6): 50 mg via ORAL
  Filled 2023-09-27 (×6): qty 1

## 2023-09-27 MED ORDER — INSULIN ASPART 100 UNIT/ML IJ SOLN
0.0000 [IU] | Freq: Three times a day (TID) | INTRAMUSCULAR | Status: DC
  Administered 2023-09-27: 8 [IU] via SUBCUTANEOUS
  Administered 2023-09-27: 2 [IU] via SUBCUTANEOUS
  Administered 2023-09-28: 11 [IU] via SUBCUTANEOUS
  Administered 2023-09-28: 5 [IU] via SUBCUTANEOUS
  Administered 2023-09-28: 3 [IU] via SUBCUTANEOUS
  Administered 2023-09-29: 0 [IU] via SUBCUTANEOUS
  Administered 2023-09-30: 3 [IU] via SUBCUTANEOUS
  Administered 2023-09-30: 11 [IU] via SUBCUTANEOUS
  Administered 2023-09-30: 8 [IU] via SUBCUTANEOUS
  Administered 2023-10-01 (×2): 2 [IU] via SUBCUTANEOUS
  Administered 2023-10-01 – 2023-10-02 (×2): 5 [IU] via SUBCUTANEOUS
  Administered 2023-10-02: 3 [IU] via SUBCUTANEOUS
  Administered 2023-10-03: 5 [IU] via SUBCUTANEOUS
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
## 2023-09-27 NOTE — Plan of Care (Signed)
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
## 2023-09-27 NOTE — Inpatient Diabetes Management (Signed)
 Inpatient Diabetes Program Recommendations  AACE/ADA: New Consensus Statement on Inpatient Glycemic Control (2015)  Target Ranges:  Prepandial:   less than 140 mg/dL      Peak postprandial:   less than 180 mg/dL (1-2 hours)      Critically ill patients:  140 - 180 mg/dL   Lab Results  Component Value Date   GLUCAP 115 (H) 09/27/2023   HGBA1C 10.2 (A) 09/18/2023    Latest Reference Range & Units 09/26/23 15:28 09/26/23 17:03 09/26/23 17:30 09/26/23 21:07 09/27/23 08:03  Glucose-Capillary 70 - 99 mg/dL 161 (H) 41 (LL) 96 096 (H) 115 (H)  (LL): Data is critically low (H): Data is abnormally high Review of Glycemic Control  Diabetes history: type 1 Outpatient Diabetes medications: Lantus 32 units daily, Novolog 0-30 units scale TID Current orders for Inpatient glycemic control: Lantus 15 units BID, Novolog 0-15 units correction scale TID, Novolog 0-5 units HS scale, Novolog 5 units TID with meals  Inpatient Diabetes Program Recommendations:   Noted that patient had a low CBG of 41 mg/dl yesterday evening. Noted that patient has changed to Glucerna for meal supplements.   Recommend: Decreasing Novolog correction scale to 0-9 units TID Decreasing Novolog meal coverage to 4 units TID  Will continue to monitor blood sugars while in the hospital.  Smith Mince RN BSN CDE Diabetes Coordinator Pager: [phone]  8am-5pm
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
## 2023-09-28 LAB — GLUCOSE, CAPILLARY
Glucose-Capillary: 234 mg/dL — ABNORMAL HIGH (ref 70–99)
Glucose-Capillary: 326 mg/dL — ABNORMAL HIGH (ref 70–99)
Glucose-Capillary: 178 mg/dL — ABNORMAL HIGH (ref 70–99)
Glucose-Capillary: 138 mg/dL — ABNORMAL HIGH (ref 70–99)

## 2023-09-28 MED ORDER — HYDROMORPHONE HCL 1 MG/ML IJ SOLN: 0.5000 mg | INTRAMUSCULAR | Status: DC | PRN

## 2023-09-28 MED ORDER — HYDROMORPHONE HCL 1 MG/ML IJ SOLN
0.5000 mg | INTRAMUSCULAR | Status: DC | PRN
  Administered 2023-09-28 – 2023-09-30 (×9): 0.5 mg via INTRAVENOUS
  Filled 2023-09-28 (×9): qty 0.5

## 2023-09-28 NOTE — Progress Notes (Signed)
  Inpatient Rehabilitation Admissions Coordinator   I met with patient at bedside and then spoke with her Mom by phone. Pt is stand pivot to chair with therapy and with nursing today. Patient states she needs to build confidence in pivoting to get up and to [facility]. I explained that she will need to practice that over the weekend.  Patient's medicaid is for family planning only and does not cover rehab. I discussed estimated cost of care daily rate for CIR, that she does not need at this time, could not tolerate and recommend direct d/c home. Mom states she has contacted  ramp contracter to rent. I explained that likely be ready to d/c Monday. Mom reports she can take a few days off to settle her in and that pt's grandmother can assist also. Nursing and TOC aware. We will sign off.  Ottie Glazier, RN, MSN Rehab Admissions Coordinator [phone] 09/28/2023 12:18 PM

## 2023-09-28 NOTE — Inpatient Diabetes Management (Addendum)
 Inpatient Diabetes Program Recommendations  AACE/ADA: New Consensus Statement on Inpatient Glycemic Control (2015)  Target Ranges:  Prepandial:   less than 140 mg/dL      Peak postprandial:   less than 180 mg/dL (1-2 hours)      Critically ill patients:  140 - 180 mg/dL   Lab Results  Component Value Date   GLUCAP 326 (H) 09/28/2023   HGBA1C 10.2 (A) 09/18/2023    Latest Reference Range & Units 09/27/23 16:29 09/27/23 21:04 09/28/23 08:02 09/28/23 11:31  Glucose-Capillary 70 - 99 mg/dL 161 (H) 096 (H) 045 (H) 326 (H)  (H): Data is abnormally high Review of Glycemic Control  Diabetes history: type 1 Outpatient Diabetes medications: Lantus 32 units daily, Novolog 0-30 units scale TID Current orders for Inpatient glycemic control: Lantus 15 units BID, Novolog 0-9 units correction scale TID, Novolog 0-5 units HS scale, Novolog 4 units TID with meals.  Inpatient Diabetes Program Recommendations:   Noted that CBGs continue to be elevated after meals.   Recommendations if blood sugars continue to be elevated:  Increase Lantus to 16 units BID Continue Novolog 0-9 units correction scale TID, HS scale Increase Novolog meal coverage to 6 units TID  Spoke to patient at the bedside to find out what she has been eating while in the hospital. States that no one is bringing food into her. She states that her blood sugars are always up and down at home.   Will continue to monitor blood sugars while in the hospital.  Smith Mince RN BSN CDE Diabetes Coordinator Pager: [phone]  8am-5pm

## 2023-09-28 NOTE — TOC Progression Note (Addendum)
 Transition of Care [facility]) - Progression Note    Patient Details  Name: Madison Williams MRN: [id number] Date of Birth: 06/26/1986  [facility] CM/SW Contact  Glennon Mac, RN Phone Number: 09/28/2023, 2:33pm  Clinical Narrative:    Notified by patient's nurse that patient is upset and crying because her mother is unable to afford the ramp she needs.  Spoke with patient's mother by phone; she states that she spoke with the ramp company, and the estimate is $10,000.  She states that she cannot afford this and she is tired of paying for patient's mistakes. She states she spent her entire retirement savings on a nice home for her daughter, granddaughter and mother, and she is done. Per mother, her home is not handicapped accessible at all, and has stairs everywhere.  She states she was told that patient had 2 weeks in rehab, and now she is told that she is discharging Monday.  Provided emotional support for patient's mother.  TOC Case Manager will be able to provide assistance with patient's DME, and mom is grateful for this.   Spoke with patient and she states that she has come up with a plan.  She states that her husband can come to get her and take her to [place], where he will be able to provide care for her.  She thinks he would not be able to come until Wednesday of next week. She states he has no stairs in the home.  Notified pt that we would need to get MD clearance for her to travel this distance, and we would need to figure out follow up appts.  Made her aware that TOC can assist with needed DME, and she is appreciative of this. Will follow up with patient on Monday for progress with therapy and pain control.    Expected Discharge Plan: IP Rehab Facility Barriers to Discharge: Continued Medical Work up  Expected Discharge Plan and Services   Discharge Planning Services: CM Consult                                           Social Determinants of Health  (SDOH) Interventions SDOH Screenings   Food Insecurity: No Food Insecurity (09/26/2023)  Recent Concern: Food Insecurity - Food Insecurity Present (09/15/2023)  Housing: Low Risk  (09/26/2023)  Transportation Needs: No Transportation Needs (09/26/2023)  Recent Concern: Transportation Needs - Unmet Transportation Needs (09/15/2023)  Utilities: Not At Risk (09/26/2023)  Depression (PHQ2-9): High Risk (09/18/2023)  Financial Resource Strain: High Risk (09/15/2023)  Physical Activity: Unknown (09/15/2023)  Social Connections: Unknown (09/15/2023)  Stress: Stress Concern Present (09/15/2023)  Tobacco Use: Medium Risk (09/21/2023)    Readmission Risk Interventions     No data to display         Quintella Baton, RN, BSN  Trauma/Neuro ICU Case Manager [phone]
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
## 2023-09-29 LAB — GLUCOSE, RANDOM: Glucose, Bld: 443 mg/dL — ABNORMAL HIGH (ref 70–99)

## 2023-09-29 LAB — GLUCOSE, CAPILLARY
Glucose-Capillary: 110 mg/dL — ABNORMAL HIGH (ref 70–99)
Glucose-Capillary: 119 mg/dL — ABNORMAL HIGH (ref 70–99)
Glucose-Capillary: 154 mg/dL — ABNORMAL HIGH (ref 70–99)
Glucose-Capillary: 155 mg/dL — ABNORMAL HIGH (ref 70–99)
Glucose-Capillary: 183 mg/dL — ABNORMAL HIGH (ref 70–99)
Glucose-Capillary: 187 mg/dL — ABNORMAL HIGH (ref 70–99)
Glucose-Capillary: 205 mg/dL — ABNORMAL HIGH (ref 70–99)
Glucose-Capillary: 233 mg/dL — ABNORMAL HIGH (ref 70–99)
Glucose-Capillary: 357 mg/dL — ABNORMAL HIGH (ref 70–99)
Glucose-Capillary: 383 mg/dL — ABNORMAL HIGH (ref 70–99)
Glucose-Capillary: 428 mg/dL — ABNORMAL HIGH (ref 70–99)
Glucose-Capillary: 442 mg/dL — ABNORMAL HIGH (ref 70–99)
Glucose-Capillary: 488 mg/dL — ABNORMAL HIGH (ref 70–99)
Glucose-Capillary: 490 mg/dL — ABNORMAL HIGH (ref 70–99)
Glucose-Capillary: 498 mg/dL — ABNORMAL HIGH (ref 70–99)
Glucose-Capillary: 78 mg/dL (ref 70–99)
Glucose-Capillary: 96 mg/dL (ref 70–99)

## 2023-09-29 MED ORDER — INSULIN REGULAR(HUMAN) IN NACL 100-0.9 UT/100ML-% IV SOLN
INTRAVENOUS | Status: DC
  Administered 2023-09-29: 9.5 [IU]/h via INTRAVENOUS
  Filled 2023-09-29: qty 100

## 2023-09-29 NOTE — Inpatient Diabetes Management (Signed)
 Inpatient Diabetes Program Recommendations  AACE/ADA: New Consensus Statement on Inpatient Glycemic Control (2015)  Target Ranges:  Prepandial:   less than 140 mg/dL      Peak postprandial:   less than 180 mg/dL (1-2 hours)      Critically ill patients:  140 - 180 mg/dL   Lab Results  Component Value Date   GLUCAP 78 09/29/2023   HGBA1C 10.2 (A) 09/18/2023    Review of Glycemic Control  Diabetes history: DM1, will require basal-bolus regimen to correct glucose trends and cover carbohydrate consumption Outpatient Diabetes medications: Lantus 32 units daily, Humalog 0-30 units TID Current orders for Inpatient glycemic control: IV insulin per [employer] for hyperglycemia  Inpatient Diabetes Program Recommendations:    Increase Lantus to 18 units BID  Continue to titrate meal coverage until CBGs  180 mg/dL  Continue to follow.  Thank you. Ailene Ards, RD, LDN, CDCES Inpatient Diabetes Coordinator [phone]
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
## 2023-09-30 DIAGNOSIS — E1065 Type 1 diabetes mellitus with hyperglycemia: Secondary | ICD-10-CM

## 2023-09-30 LAB — GLUCOSE, CAPILLARY
Glucose-Capillary: 103 mg/dL — ABNORMAL HIGH (ref 70–99)
Glucose-Capillary: 108 mg/dL — ABNORMAL HIGH (ref 70–99)
Glucose-Capillary: 110 mg/dL — ABNORMAL HIGH (ref 70–99)
Glucose-Capillary: 149 mg/dL — ABNORMAL HIGH (ref 70–99)
Glucose-Capillary: 162 mg/dL — ABNORMAL HIGH (ref 70–99)
Glucose-Capillary: 251 mg/dL — ABNORMAL HIGH (ref 70–99)
Glucose-Capillary: 347 mg/dL — ABNORMAL HIGH (ref 70–99)

## 2023-09-30 MED ORDER — INSULIN GLARGINE 100 UNIT/ML ~~LOC~~ SOLN
18.0000 [IU] | Freq: Two times a day (BID) | SUBCUTANEOUS | Status: DC
  Administered 2023-09-30: 18 [IU] via SUBCUTANEOUS
  Administered 2023-09-30: 2 [IU] via SUBCUTANEOUS
  Filled 2023-09-30 (×3): qty 0.18

## 2023-09-30 MED ORDER — INSULIN GLARGINE 100 UNIT/ML ~~LOC~~ SOLN
18.0000 [IU] | Freq: Two times a day (BID) | SUBCUTANEOUS | Status: DC
  Filled 2023-09-30 (×2): qty 0.18

## 2023-09-30 MED ORDER — INSULIN ASPART 100 UNIT/ML IJ SOLN
6.0000 [IU] | Freq: Three times a day (TID) | INTRAMUSCULAR | Status: DC
  Administered 2023-09-30 – 2023-10-02 (×5): 6 [IU] via SUBCUTANEOUS

## 2023-09-30 MED ORDER — OXYCODONE HCL 5 MG PO TABS
15.0000 mg | ORAL_TABLET | ORAL | Status: DC | PRN
  Administered 2023-09-30: 20 mg via ORAL
  Administered 2023-09-30: 15 mg via ORAL
  Administered 2023-09-30: 20 mg via ORAL
  Administered 2023-09-30: 15 mg via ORAL
  Administered 2023-10-01: 20 mg via ORAL
  Administered 2023-10-02: 15 mg via ORAL
  Administered 2023-10-02: 20 mg via ORAL
  Administered 2023-10-02: 15 mg via ORAL
  Administered 2023-10-02 – 2023-10-03 (×6): 20 mg via ORAL
  Filled 2023-09-30 (×2): qty 4
  Filled 2023-09-30: qty 3
  Filled 2023-09-30 (×5): qty 4
  Filled 2023-09-30: qty 3
  Filled 2023-09-30 (×4): qty 4
  Filled 2023-09-30: qty 3

## 2023-09-30 MED ORDER — INSULIN GLARGINE 100 UNIT/ML ~~LOC~~ SOLN
20.0000 [IU] | Freq: Two times a day (BID) | SUBCUTANEOUS | Status: DC
  Filled 2023-09-30 (×2): qty 0.2

## 2023-09-30 MED ORDER — INSULIN GLARGINE 100 UNIT/ML ~~LOC~~ SOLN
16.0000 [IU] | Freq: Two times a day (BID) | SUBCUTANEOUS | Status: DC
  Administered 2023-09-30: 16 [IU] via SUBCUTANEOUS
  Filled 2023-09-30 (×2): qty 0.16

## 2023-09-30 MED ORDER — INSULIN GLARGINE 100 UNITS/ML SOLOSTAR PEN: 2.0000 [IU] | PEN_INJECTOR | Freq: Once | SUBCUTANEOUS | Status: DC

## 2023-09-30 MED ORDER — INSULIN GLARGINE 100 UNIT/ML ~~LOC~~ SOLN: 20.0000 [IU] | Freq: Two times a day (BID) | SUBCUTANEOUS | Status: DC

## 2023-09-30 MED ORDER — HYDROMORPHONE HCL 1 MG/ML IJ SOLN
0.5000 mg | INTRAMUSCULAR | Status: DC | PRN
  Administered 2023-09-30 (×2): 0.5 mg via INTRAVENOUS
  Filled 2023-09-30 (×2): qty 0.5
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
## 2023-09-30 NOTE — Progress Notes (Signed)
 Patient was doing "inventory count" of home medications at bedside. Called out to this RN to assist in finding "missing pill". Educated patient on no home medications at bedside policy. Inventory count for 3 medications (gabapentin, clonazepam, and ibuprofen) completed with patient. Medications sent to pharmacy. Receipt in patient chart.

## 2023-09-30 NOTE — Plan of Care (Signed)
   Problem: Education: Goal: Knowledge of General Education information will improve Description: Including pain rating scale, medication(s)/side effects and non-pharmacologic comfort measures Outcome: Progressing   Problem: Nutrition: Goal: Adequate nutrition will be maintained Outcome: Progressing   Problem: Safety: Goal: Ability to remain free from injury will improve Outcome: Progressing
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
## 2023-10-01 ENCOUNTER — Inpatient Hospital Stay (HOSPITAL_COMMUNITY): Payer: MEDICAID

## 2023-10-01 DIAGNOSIS — T1490XA Injury, unspecified, initial encounter: Secondary | ICD-10-CM | POA: Diagnosis not present

## 2023-10-01 DIAGNOSIS — E1065 Type 1 diabetes mellitus with hyperglycemia: Secondary | ICD-10-CM | POA: Diagnosis not present

## 2023-10-01 LAB — GLUCOSE, CAPILLARY
Glucose-Capillary: 201 mg/dL — ABNORMAL HIGH (ref 70–99)
Glucose-Capillary: 133 mg/dL — ABNORMAL HIGH (ref 70–99)
Glucose-Capillary: 132 mg/dL — ABNORMAL HIGH (ref 70–99)
Glucose-Capillary: 171 mg/dL — ABNORMAL HIGH (ref 70–99)

## 2023-10-01 MED ORDER — BISACODYL 10 MG RE SUPP
10.0000 mg | Freq: Once | RECTAL | Status: AC
  Administered 2023-10-01: 10 mg via RECTAL
  Filled 2023-10-01: qty 1

## 2023-10-01 MED ORDER — SODIUM CHLORIDE 0.9 % IV SOLN
12.5000 mg | Freq: Once | INTRAVENOUS | Status: AC
  Administered 2023-10-01: 12.5 mg via INTRAVENOUS
  Filled 2023-10-01: qty 0.5

## 2023-10-01 MED ORDER — ONDANSETRON HCL 4 MG/2ML IJ SOLN
4.0000 mg | INTRAMUSCULAR | Status: DC | PRN
  Administered 2023-10-01: 4 mg via INTRAVENOUS
  Filled 2023-10-01: qty 2

## 2023-10-01 MED ORDER — MAGNESIUM HYDROXIDE 400 MG/5ML PO SUSP
30.0000 mL | Freq: Once | ORAL | Status: AC
  Administered 2023-10-01: 30 mL via ORAL
  Filled 2023-10-01: qty 30

## 2023-10-01 MED ORDER — SODIUM CHLORIDE 0.9 % IV SOLN
12.5000 mg | Freq: Four times a day (QID) | INTRAVENOUS | Status: DC | PRN
  Administered 2023-10-01: 12.5 mg via INTRAVENOUS
  Filled 2023-10-01: qty 0.5

## 2023-10-01 MED ORDER — POLYETHYLENE GLYCOL 3350 17 G PO PACK
17.0000 g | PACK | Freq: Two times a day (BID) | ORAL | Status: DC
  Administered 2023-10-01 – 2023-10-03 (×4): 17 g via ORAL
  Filled 2023-10-01 (×4): qty 1

## 2023-10-01 MED ORDER — HYDROMORPHONE HCL 1 MG/ML IJ SOLN: 0.5000 mg | Freq: Four times a day (QID) | INTRAMUSCULAR | Status: DC | PRN

## 2023-10-01 MED ORDER — INSULIN GLARGINE 100 UNIT/ML ~~LOC~~ SOLN
20.0000 [IU] | Freq: Two times a day (BID) | SUBCUTANEOUS | Status: DC
  Administered 2023-10-01 (×2): 20 [IU] via SUBCUTANEOUS
  Filled 2023-10-01 (×4): qty 0.2

## 2023-10-01 MED ORDER — SODIUM CHLORIDE 0.9 % IV SOLN: 25.0000 mg | Freq: Four times a day (QID) | INTRAVENOUS | Status: DC | PRN

## 2023-10-01 MED ORDER — HYDROMORPHONE HCL 1 MG/ML IJ SOLN
0.5000 mg | INTRAMUSCULAR | Status: DC | PRN
  Administered 2023-10-01 – 2023-10-02 (×3): 0.5 mg via INTRAVENOUS
  Filled 2023-10-01 (×3): qty 0.5

## 2023-10-01 MED ORDER — APIXABAN 2.5 MG PO TABS
2.5000 mg | ORAL_TABLET | Freq: Two times a day (BID) | ORAL | Status: DC
  Administered 2023-10-01 – 2023-10-03 (×4): 2.5 mg via ORAL
  Filled 2023-10-01 (×4): qty 1

## 2023-10-01 NOTE — Progress Notes (Signed)
 PT Cancellation Note  Patient Details Name: Madison Williams MRN: [id number] DOB: 06/25/1986   Cancelled Treatment:    Reason Eval/Treat Not Completed: Other (comment) Pt declining secondary to nausea. Was given nausea medication prior to attempt.  Lillia Pauls, PT, DPT Acute Rehabilitation Services Office [phone]    [name] 10/01/2023, 2:26 PM
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
## 2023-10-01 NOTE — Progress Notes (Signed)
 NT told RN pt had an episode of emesis. Barnetta Chapel, PA notified and aware. New orders received.
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
## 2023-10-01 NOTE — Plan of Care (Signed)
  Problem: Clinical Measurements: Goal: Will remain free from infection Outcome: Progressing Goal: Diagnostic test results will improve Outcome: Progressing   Problem: Safety: Goal: Ability to remain free from injury will improve Outcome: Progressing   Problem: Skin Integrity: Goal: Risk for impaired skin integrity will decrease Outcome: Progressing   Problem: Metabolic: Goal: Ability to maintain appropriate glucose levels will improve Outcome: Progressing   Problem: Tissue Perfusion: Goal: Adequacy of tissue perfusion will improve Outcome: Progressing
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
## 2023-10-01 NOTE — TOC Progression Note (Signed)
 Transition of Care [facility]) - Progression Note    Patient Details  Name: Madison Williams MRN: [id number] Date of Birth: 1986-06-21  Transition of Care [facility]) CM/SW Contact  Astrid Drafts Berna Spare, RN Phone Number: 10/01/2023, 12:30 PM  Clinical Narrative:    Spoke with patient's husband, [name], who lives in [place]: he confirms that he will be picking up patient on Wednesday.  He plans to rent a handicapped accessible hotel room for Wednesday night, with plan to leave early the next morning.  Referral to [facility] for recommended DME; WC, RW, and BSC to be delivered to bedside prior to dc.    Expected Discharge Plan: Home/Self Care Barriers to Discharge: Continued Medical Work up  Expected Discharge Plan and Services   Discharge Planning Services: CM Consult   Living arrangements for the past 2 months: Single Family Home                 DME Arranged: Walker rolling, Wheelchair manual, Bedside commode DME Agency: [employer] Date DME Agency Contacted: 10/01/23 Time DME Agency Contacted: 1224 Representative spoke with at DME Agency: Zollie Beckers             Social Determinants of Health (SDOH) Interventions SDOH Screenings   Food Insecurity: No Food Insecurity (09/26/2023)  Recent Concern: Food Insecurity - Food Insecurity Present (09/15/2023)  Housing: Low Risk  (09/26/2023)  Transportation Needs: No Transportation Needs (09/26/2023)  Recent Concern: Transportation Needs - Unmet Transportation Needs (09/15/2023)  Utilities: Not At Risk (09/26/2023)  Depression (PHQ2-9): High Risk (09/18/2023)  Financial Resource Strain: High Risk (09/15/2023)  Physical Activity: Unknown (09/15/2023)  Social Connections: Unknown (09/15/2023)  Stress: Stress Concern Present (09/15/2023)  Tobacco Use: Medium Risk (09/21/2023)    Readmission Risk Interventions     No data to display         Quintella Baton, RN, BSN  Trauma/Neuro ICU Case Manager [phone]
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
## 2023-10-02 LAB — GLUCOSE, CAPILLARY
Glucose-Capillary: 205 mg/dL — ABNORMAL HIGH (ref 70–99)
Glucose-Capillary: 192 mg/dL — ABNORMAL HIGH (ref 70–99)
Glucose-Capillary: 87 mg/dL (ref 70–99)
Glucose-Capillary: 258 mg/dL — ABNORMAL HIGH (ref 70–99)

## 2023-10-02 MED ORDER — HYDROMORPHONE HCL 1 MG/ML IJ SOLN
0.5000 mg | Freq: Three times a day (TID) | INTRAMUSCULAR | Status: DC | PRN
  Administered 2023-10-02 – 2023-10-03 (×4): 0.5 mg via INTRAVENOUS
  Filled 2023-10-02 (×4): qty 0.5

## 2023-10-02 MED ORDER — INSULIN GLARGINE 100 UNIT/ML ~~LOC~~ SOLN: 25.0000 [IU] | Freq: Two times a day (BID) | SUBCUTANEOUS | Status: DC

## 2023-10-02 MED ORDER — INSULIN GLARGINE 100 UNIT/ML ~~LOC~~ SOLN
28.0000 [IU] | Freq: Two times a day (BID) | SUBCUTANEOUS | Status: DC
  Administered 2023-10-02 (×2): 28 [IU] via SUBCUTANEOUS
  Filled 2023-10-02 (×4): qty 0.28
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
## 2023-10-02 NOTE — Plan of Care (Signed)
  Problem: Acute Rehab PT Goals(only PT should resolve) Goal: Pt Will Go Supine/Side To Sit Outcome: Completed/Met Flowsheets (Taken 09/22/2023 1845 by Dimas Aguas, PT) Pt will go Supine/Side to Sit: with supervision Goal: Pt Will Go Sit To Supine/Side Outcome: Completed/Met Flowsheets (Taken 09/22/2023 1845 by Dimas Aguas, PT) Pt will go Sit to Supine/Side: with supervision Goal: Pt Will Transfer Bed To Chair/Chair To Bed Outcome: Completed/Met Flowsheets (Taken 09/22/2023 1845 by Dimas Aguas, PT) Pt will Transfer Bed to Chair/Chair to Bed: with min assist Goal: Pt Will Go Up/Down Stairs Outcome: Not Met (add Reason) Note: Pt non-ambulatory due to WB restrictions

## 2023-10-02 NOTE — Plan of Care (Signed)
  Problem: Education: Goal: Knowledge of General Education information will improve Description: Including pain rating scale, medication(s)/side effects and non-pharmacologic comfort measures Outcome: Progressing   Problem: Health Behavior/Discharge Planning: Goal: Ability to manage health-related needs will improve Outcome: Progressing   Problem: Clinical Measurements: Goal: Ability to maintain clinical measurements within normal limits will improve Outcome: Progressing Goal: Will remain free from infection Outcome: Progressing Goal: Diagnostic test results will improve Outcome: Progressing Goal: Respiratory complications will improve Outcome: Progressing Goal: Cardiovascular complication will be avoided Outcome: Progressing   Problem: Activity: Goal: Risk for activity intolerance will decrease Outcome: Progressing   Problem: Nutrition: Goal: Adequate nutrition will be maintained Outcome: Progressing   Problem: Coping: Goal: Level of anxiety will decrease Outcome: Progressing   Problem: Elimination: Goal: Will not experience complications related to bowel motility Outcome: Progressing Goal: Will not experience complications related to urinary retention Outcome: Progressing   Problem: Pain Managment: Goal: General experience of comfort will improve and/or be controlled Outcome: Progressing   Problem: Safety: Goal: Ability to remain free from injury will improve Outcome: Progressing   Problem: Skin Integrity: Goal: Risk for impaired skin integrity will decrease Outcome: Progressing   Problem: Education: Goal: Understanding of CV disease, CV risk reduction, and recovery process will improve Outcome: Progressing Goal: Individualized Educational Video(s) Outcome: Progressing   Problem: Activity: Goal: Ability to return to baseline activity level will improve Outcome: Progressing   Problem: Cardiovascular: Goal: Ability to achieve and maintain adequate  cardiovascular perfusion will improve Outcome: Progressing Goal: Vascular access site(s) Level 0-1 will be maintained Outcome: Progressing   Problem: Health Behavior/Discharge Planning: Goal: Ability to safely manage health-related needs after discharge will improve Outcome: Progressing   Problem: Education: Goal: Ability to describe self-care measures that may prevent or decrease complications (Diabetes Survival Skills Education) will improve Outcome: Progressing Goal: Individualized Educational Video(s) Outcome: Progressing   Problem: Coping: Goal: Ability to adjust to condition or change in health will improve Outcome: Progressing
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
## 2023-10-03 ENCOUNTER — Telehealth (HOSPITAL_COMMUNITY): Payer: Self-pay | Admitting: Pharmacy Technician

## 2023-10-03 ENCOUNTER — Other Ambulatory Visit (HOSPITAL_COMMUNITY): Payer: Self-pay

## 2023-10-03 LAB — GLUCOSE, CAPILLARY
Glucose-Capillary: 52 mg/dL — ABNORMAL LOW (ref 70–99)
Glucose-Capillary: 92 mg/dL (ref 70–99)
Glucose-Capillary: 202 mg/dL — ABNORMAL HIGH (ref 70–99)

## 2023-10-03 MED ORDER — OXYCODONE HCL 10 MG PO TABS
15.0000 mg | ORAL_TABLET | ORAL | 0 refills | Status: DC | PRN
Start: 2023-10-03 — End: 2024-01-01
  Filled 2023-10-03: qty 70, 6d supply, fill #0

## 2023-10-03 MED ORDER — INSULIN GLARGINE 100 UNIT/ML ~~LOC~~ SOLN
25.0000 [IU] | Freq: Two times a day (BID) | SUBCUTANEOUS | Status: DC
  Administered 2023-10-03: 25 [IU] via SUBCUTANEOUS
  Filled 2023-10-03 (×3): qty 0.25

## 2023-10-03 MED ORDER — POLYETHYLENE GLYCOL 3350 17 G PO PACK: 17.0000 g | PACK | Freq: Two times a day (BID) | ORAL | Status: DC

## 2023-10-03 MED ORDER — APIXABAN 2.5 MG PO TABS
2.5000 mg | ORAL_TABLET | Freq: Two times a day (BID) | ORAL | 0 refills | Status: DC
  Filled 2023-10-03: qty 36, 18d supply, fill #0

## 2023-10-03 MED ORDER — BACLOFEN 10 MG PO TABS
10.0000 mg | ORAL_TABLET | Freq: Three times a day (TID) | ORAL | 0 refills | Status: DC
  Filled 2023-10-03: qty 30, 10d supply, fill #0

## 2023-10-03 MED ORDER — MIRTAZAPINE 15 MG PO TABS
15.0000 mg | ORAL_TABLET | Freq: Every day | ORAL | 0 refills | Status: DC
  Filled 2023-10-03: qty 30, 30d supply, fill #0

## 2023-10-03 MED ORDER — QUETIAPINE FUMARATE 50 MG PO TABS
50.0000 mg | ORAL_TABLET | Freq: Every day | ORAL | 0 refills | Status: AC
  Filled 2023-10-03: qty 30, 30d supply, fill #0

## 2023-10-03 MED ORDER — BACLOFEN 10 MG PO TABS
10.0000 mg | ORAL_TABLET | Freq: Three times a day (TID) | ORAL | Status: DC
  Administered 2023-10-03 (×2): 10 mg via ORAL
  Filled 2023-10-03 (×2): qty 1

## 2023-10-03 MED ORDER — ASPIRIN 81 MG PO TBEC
81.0000 mg | DELAYED_RELEASE_TABLET | Freq: Every day | ORAL | 0 refills | Status: AC
  Filled 2023-10-03: qty 30, 30d supply, fill #0

## 2023-10-03 MED ORDER — ACETAMINOPHEN 500 MG PO TABS: 1000.0000 mg | ORAL_TABLET | Freq: Four times a day (QID) | ORAL | Status: DC | PRN

## 2023-10-03 NOTE — Plan of Care (Signed)

## 2023-10-03 NOTE — Telephone Encounter (Signed)
 Pharmacy Patient Advocate Encounter   Received notification from CoverMyMeds that prior authorization for Oxycodone HCl 10 MG TABS is required/requested.   Insurance verification completed.   The patient is insured through [facility] MEDICAID .   Per test claim: PA required; PA submitted to above mentioned insurance via [facility] Tracks Key/confirmation #/EOC [id number] W Status is pending

## 2023-10-03 NOTE — Progress Notes (Signed)
 Discharge teaching done with pt and husband, pt and husband voice understanding of dc teaching and no questions asked. Two ivs removed from left arm, gauze and tape over site. No bleeding noted. home meds returned, TOC meds given. DME given and taken to car by husband. Pt dressed by husband and escorted to car by wheelchair and staff.

## 2023-10-03 NOTE — Plan of Care (Signed)
 Problem: Education: Goal: Knowledge of General Education information will improve Description: Including pain rating scale, medication(s)/side effects and non-pharmacologic comfort measures 10/03/2023 1703 by Dannielle Burn, RN Outcome: Adequate for Discharge 10/03/2023 1159 by Dannielle Burn, RN Outcome: Progressing   Problem: Health Behavior/Discharge Planning: Goal: Ability to manage health-related needs will improve 10/03/2023 1703 by Dannielle Burn, RN Outcome: Adequate for Discharge 10/03/2023 1159 by Dannielle Burn, RN Outcome: Progressing   Problem: Clinical Measurements: Goal: Ability to maintain clinical measurements within normal limits will improve 10/03/2023 1703 by Dannielle Burn, RN Outcome: Adequate for Discharge 10/03/2023 1159 by Dannielle Burn, RN Outcome: Progressing Goal: Will remain free from infection 10/03/2023 1703 by Dannielle Burn, RN Outcome: Adequate for Discharge 10/03/2023 1159 by Dannielle Burn, RN Outcome: Progressing Goal: Diagnostic test results will improve 10/03/2023 1703 by Dannielle Burn, RN Outcome: Adequate for Discharge 10/03/2023 1159 by Dannielle Burn, RN Outcome: Progressing Goal: Respiratory complications will improve 10/03/2023 1703 by Dannielle Burn, RN Outcome: Adequate for Discharge 10/03/2023 1159 by Dannielle Burn, RN Outcome: Progressing Goal: Cardiovascular complication will be avoided 10/03/2023 1703 by Dannielle Burn, RN Outcome: Adequate for Discharge 10/03/2023 1159 by Dannielle Burn, RN Outcome: Progressing   Problem: Activity: Goal: Risk for activity intolerance will decrease 10/03/2023 1703 by Dannielle Burn, RN Outcome: Adequate for Discharge 10/03/2023 1159 by Dannielle Burn, RN Outcome: Progressing   Problem: Nutrition: Goal: Adequate nutrition will be maintained 10/03/2023 1703 by Dannielle Burn, RN Outcome: Adequate for Discharge 10/03/2023 1159 by Dannielle Burn, RN Outcome: Progressing    Problem: Coping: Goal: Level of anxiety will decrease 10/03/2023 1703 by Dannielle Burn, RN Outcome: Adequate for Discharge 10/03/2023 1159 by Dannielle Burn, RN Outcome: Progressing   Problem: Elimination: Goal: Will not experience complications related to bowel motility 10/03/2023 1703 by Dannielle Burn, RN Outcome: Adequate for Discharge 10/03/2023 1159 by Dannielle Burn, RN Outcome: Progressing Goal: Will not experience complications related to urinary retention 10/03/2023 1703 by Dannielle Burn, RN Outcome: Adequate for Discharge 10/03/2023 1159 by Dannielle Burn, RN Outcome: Progressing   Problem: Pain Managment: Goal: General experience of comfort will improve and/or be controlled 10/03/2023 1703 by Dannielle Burn, RN Outcome: Adequate for Discharge 10/03/2023 1159 by Dannielle Burn, RN Outcome: Progressing   Problem: Safety: Goal: Ability to remain free from injury will improve 10/03/2023 1703 by Dannielle Burn, RN Outcome: Adequate for Discharge 10/03/2023 1159 by Dannielle Burn, RN Outcome: Progressing   Problem: Skin Integrity: Goal: Risk for impaired skin integrity will decrease 10/03/2023 1703 by Dannielle Burn, RN Outcome: Adequate for Discharge 10/03/2023 1159 by Dannielle Burn, RN Outcome: Progressing   Problem: Education: Goal: Understanding of CV disease, CV risk reduction, and recovery process will improve 10/03/2023 1703 by Dannielle Burn, RN Outcome: Adequate for Discharge 10/03/2023 1159 by Dannielle Burn, RN Outcome: Progressing Goal: Individualized Educational Video(s) 10/03/2023 1703 by Dannielle Burn, RN Outcome: Adequate for Discharge 10/03/2023 1159 by Dannielle Burn, RN Outcome: Progressing   Problem: Activity: Goal: Ability to return to baseline activity level will improve 10/03/2023 1703 by Dannielle Burn, RN Outcome: Adequate for Discharge 10/03/2023 1159 by Dannielle Burn, RN Outcome: Progressing   Problem:  Cardiovascular: Goal: Ability to achieve and maintain adequate cardiovascular perfusion will improve 10/03/2023 1703 by Dannielle Burn, RN Outcome: Adequate for Discharge 10/03/2023 1159 by Dannielle Burn, RN Outcome: Progressing  Goal: Vascular access site(s) Level 0-1 will be maintained 10/03/2023 1703 by Dannielle Burn, RN Outcome: Adequate for Discharge 10/03/2023 1159 by Dannielle Burn, RN Outcome: Progressing   Problem: Health Behavior/Discharge Planning: Goal: Ability to safely manage health-related needs after discharge will improve 10/03/2023 1703 by Dannielle Burn, RN Outcome: Adequate for Discharge 10/03/2023 1159 by Dannielle Burn, RN Outcome: Progressing   Problem: Education: Goal: Ability to describe self-care measures that may prevent or decrease complications (Diabetes Survival Skills Education) will improve 10/03/2023 1703 by Dannielle Burn, RN Outcome: Adequate for Discharge 10/03/2023 1159 by Dannielle Burn, RN Outcome: Progressing Goal: Individualized Educational Video(s) 10/03/2023 1703 by Dannielle Burn, RN Outcome: Adequate for Discharge 10/03/2023 1159 by Dannielle Burn, RN Outcome: Progressing   Problem: Coping: Goal: Ability to adjust to condition or change in health will improve 10/03/2023 1703 by Dannielle Burn, RN Outcome: Adequate for Discharge 10/03/2023 1159 by Dannielle Burn, RN Outcome: Progressing   Problem: Fluid Volume: Goal: Ability to maintain a balanced intake and output will improve 10/03/2023 1703 by Dannielle Burn, RN Outcome: Adequate for Discharge 10/03/2023 1159 by Dannielle Burn, RN Outcome: Progressing   Problem: Health Behavior/Discharge Planning: Goal: Ability to identify and utilize available resources and services will improve 10/03/2023 1703 by Dannielle Burn, RN Outcome: Adequate for Discharge 10/03/2023 1159 by Dannielle Burn, RN Outcome: Progressing Goal: Ability to manage health-related needs will  improve 10/03/2023 1703 by Dannielle Burn, RN Outcome: Adequate for Discharge 10/03/2023 1159 by Dannielle Burn, RN Outcome: Progressing   Problem: Metabolic: Goal: Ability to maintain appropriate glucose levels will improve 10/03/2023 1703 by Dannielle Burn, RN Outcome: Adequate for Discharge 10/03/2023 1159 by Dannielle Burn, RN Outcome: Progressing   Problem: Nutritional: Goal: Maintenance of adequate nutrition will improve 10/03/2023 1703 by Dannielle Burn, RN Outcome: Adequate for Discharge 10/03/2023 1159 by Dannielle Burn, RN Outcome: Progressing Goal: Progress toward achieving an optimal weight will improve 10/03/2023 1703 by Dannielle Burn, RN Outcome: Adequate for Discharge 10/03/2023 1159 by Dannielle Burn, RN Outcome: Progressing   Problem: Skin Integrity: Goal: Risk for impaired skin integrity will decrease 10/03/2023 1703 by Dannielle Burn, RN Outcome: Adequate for Discharge 10/03/2023 1159 by Dannielle Burn, RN Outcome: Progressing   Problem: Tissue Perfusion: Goal: Adequacy of tissue perfusion will improve 10/03/2023 1703 by Dannielle Burn, RN Outcome: Adequate for Discharge 10/03/2023 1159 by Dannielle Burn, RN Outcome: Progressing
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
## 2023-10-03 NOTE — TOC Transition Note (Signed)
 Transition of Care [facility]) - Discharge Note   Patient Details  Name: Madison Williams MRN: [id number] Date of Birth: 04-02-1986  Transition of Care [facility] CM/SW Contact:  Glennon Mac, RN Phone Number: 10/03/2023, 12:15pm   Clinical Narrative:    Patient medically stable for discharge today; she states that her husband will be here this afternoon to pick her up.  All recommended DME has been delivered to patient's room by [employer].  DC meds sent to [facility] and will need to be picked up prior to dc home. Occupational therapist has given patient handout for exercise program at home, as patient will not have access to HH/OP therapy with [facility] in [place].   Patient in extreme pain during my visit after working with OT.  She is requesting IV Dilaudid; explained that she would not be able to receive this when she leaves the hospital, so uncertain that she can have dose. Notified patient's nurse Shanda Bumps regarding pain med request.    Final next level of care: Home/Self Care Barriers to Discharge: Barriers Resolved   Patient Goals and CMS Choice Patient states their goals for this hospitalization and ongoing recovery are:: to get rid of this pain                              Discharge Plan and Services Additional resources added to the After Visit Summary for     Discharge Planning Services: CM Consult            DME Arranged: Dan Humphreys rolling, Wheelchair manual, Bedside commode DME Agency: [employer] Date DME Agency Contacted: 10/01/23 Time DME Agency Contacted: 1224 Representative spoke with at DME Agency: Zollie Beckers            Social Drivers of Health (SDOH) Interventions SDOH Screenings   Food Insecurity: No Food Insecurity (09/26/2023)  Recent Concern: Food Insecurity - Food Insecurity Present (09/15/2023)  Housing: Low Risk  (09/26/2023)  Transportation Needs: No Transportation Needs (09/26/2023)  Recent Concern: Transportation Needs - Unmet  Transportation Needs (09/15/2023)  Utilities: Not At Risk (09/26/2023)  Depression (PHQ2-9): High Risk (09/18/2023)  Financial Resource Strain: High Risk (09/15/2023)  Physical Activity: Unknown (09/15/2023)  Social Connections: Unknown (09/15/2023)  Stress: Stress Concern Present (09/15/2023)  Tobacco Use: Medium Risk (09/21/2023)     Readmission Risk Interventions     No data to display         Quintella Baton, RN, BSN  Trauma/Neuro ICU Case Manager [phone]

## 2023-10-03 NOTE — Progress Notes (Signed)
 OT Cancellation Note  Patient Details Name: Madison Williams MRN: [id number] DOB: 1985-12-12   Cancelled Treatment:    Reason Eval/Treat Not Completed: Pain limiting ability to participate (Patient stating she is currently in too much pain and asked that OT try again around 2 after she gets more pain meds. OT to attempt later today as schedule permits.) Alfonse Flavors, OTA Acute Rehabilitation Services  Office [phone]  Dewain Penning 10/03/2023, 10:19 AM

## 2023-10-04 ENCOUNTER — Telehealth: Payer: Self-pay

## 2023-10-04 ENCOUNTER — Other Ambulatory Visit: Payer: Self-pay

## 2023-10-04 NOTE — Transitions of Care (Post Inpatient/ED Visit) (Signed)
   10/04/2023  Name: Madison Williams MRN: [id number] DOB: 1986-06-06  Today's TOC FU Call Status: Today's TOC FU Call Status:: Successful TOC FU Call Completed TOC FU Call Complete Date: 10/04/23 Patient's Name and Date of Birth confirmed.  Transition Care Management Follow-up Telephone Call Date of Discharge: 10/03/23 Discharge Facility: [facility]) Type of Discharge: Inpatient Admission Primary Inpatient Discharge Diagnosis:: trauma How have you been since you were released from the hospital?: Same (she reported being in alot of pain. she said she is currenlty packing her belongings and she and her husband will be on the way to [place] shortly. She said she is veyr blessed to have a place to go that has no stairs and she will have 24 hour caregiver.) Any questions or concerns?: No  Items Reviewed: Did you receive and understand the discharge instructions provided?: Yes Medications obtained,verified, and reconciled?: Partial Review Completed Reason for Partial Mediation Review: She was in the process of packing her belongings for her trip to [place]. She stated she has all of her medications. She has a Freestyle meter but not the Dexom and she has not started using the Omnipod. She explained that she contacted her pharmacy and requested the sensors for the Freestyle be mailed to her in [place]. Any new allergies since your discharge?: No Dietary orders reviewed?: No Do you have support at home?: Yes People in Home: spouse Name of Support/Comfort Primary Source: She said she will have 24 hour care in [place]  Medications Reviewed Today: Medications Reviewed Today   Medications were not reviewed in this encounter     Home Care and Equipment/Supplies: Were Home Health Services Ordered?: No Any new equipment or medical supplies ordered?: Yes Name of Medical supply agency?: [employer]- RW,  wheelchair and BSC Were you able to get the equipment/medical supplies?: Yes Do you have any  questions related to the use of the equipment/supplies?: No  Functional Questionnaire: Do you need assistance with bathing/showering or dressing?: Yes Do you need assistance with meal preparation?: Yes Do you need assistance with eating?: No Do you have difficulty maintaining continence: Yes Do you need assistance with getting out of bed/getting out of a chair/moving?: Yes (NWB LLE and WB RLE for transfers.  has wheelchair and RW) Do you have difficulty managing or taking your medications?: No  Follow up appointments reviewed: PCP Follow-up appointment confirmed?: No MD Provider Line Number:[phone] Given: No (She is going to [place] but said she may be back in 6 weeks and will call PCP for an appointment then. The current appointment with Dr Alvis Lemmings is 03/18/2024.) Specialist Hospital Follow-up appointment confirmed?: No Reason Specialist Follow-Up Not Confirmed:  (She understands she will need to find an orthopedic surgery for follow up in 2 weeks as noted on her AVS.) Do you need transportation to your follow-up appointment?: No Do you understand care options if your condition(s) worsen?: Yes-patient verbalized understanding    SIGNATURE Robyne Peers, RN

## 2023-10-05 ENCOUNTER — Other Ambulatory Visit (HOSPITAL_COMMUNITY): Payer: Self-pay

## 2023-10-10 ENCOUNTER — Telehealth: Payer: Self-pay | Admitting: Family Medicine

## 2023-10-10 NOTE — Telephone Encounter (Signed)
 Noted.

## 2023-10-10 NOTE — Telephone Encounter (Addendum)
 A document form has been faxed: FMLA, to be filled out by provider. Send document back via Fax within 5-days. Document is located in providers tray at front office.           Fax number:  [phone]    Called pt and left vm to call office back to schedule appt

## 2023-10-15 ENCOUNTER — Other Ambulatory Visit: Payer: Self-pay

## 2023-10-16 ENCOUNTER — Other Ambulatory Visit (HOSPITAL_COMMUNITY): Payer: Self-pay

## 2023-10-17 ENCOUNTER — Other Ambulatory Visit (HOSPITAL_COMMUNITY): Payer: Self-pay

## 2023-10-17 ENCOUNTER — Other Ambulatory Visit: Payer: Self-pay

## 2023-10-17 ENCOUNTER — Encounter: Payer: Self-pay | Admitting: Pharmacist

## 2023-10-22 ENCOUNTER — Telehealth: Admitting: Family Medicine

## 2023-10-24 ENCOUNTER — Other Ambulatory Visit: Payer: Self-pay

## 2023-10-26 ENCOUNTER — Other Ambulatory Visit: Payer: Self-pay

## 2023-11-05 ENCOUNTER — Ambulatory Visit: Attending: Family Medicine | Admitting: Family Medicine

## 2023-11-05 ENCOUNTER — Encounter: Payer: Self-pay | Admitting: Family Medicine

## 2023-11-05 DIAGNOSIS — S329XXA Fracture of unspecified parts of lumbosacral spine and pelvis, initial encounter for closed fracture: Secondary | ICD-10-CM

## 2023-11-05 DIAGNOSIS — Z7982 Long term (current) use of aspirin: Secondary | ICD-10-CM | POA: Insufficient documentation

## 2023-11-05 DIAGNOSIS — Z029 Encounter for administrative examinations, unspecified: Secondary | ICD-10-CM

## 2023-11-05 DIAGNOSIS — S329XXD Fracture of unspecified parts of lumbosacral spine and pelvis, subsequent encounter for fracture with routine healing: Secondary | ICD-10-CM | POA: Insufficient documentation
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
## 2023-11-12 ENCOUNTER — Telehealth: Payer: Self-pay | Admitting: Nutrition

## 2023-11-12 NOTE — Telephone Encounter (Signed)
 LVM to call me to schedule insulin pump training

## 2023-11-14 ENCOUNTER — Other Ambulatory Visit: Payer: Self-pay

## 2023-11-14 ENCOUNTER — Other Ambulatory Visit (HOSPITAL_COMMUNITY): Payer: Self-pay

## 2023-11-15 ENCOUNTER — Other Ambulatory Visit: Payer: Self-pay

## 2023-11-15 ENCOUNTER — Encounter: Payer: Self-pay | Admitting: Family Medicine

## 2023-11-15 NOTE — Telephone Encounter (Signed)
 Pt has been scheduled for a MyChart for 4/24 at 830am for FMLA.

## 2023-11-15 NOTE — Telephone Encounter (Signed)
Will forward to nurse 

## 2023-11-15 NOTE — Telephone Encounter (Signed)
 Please see previous message. Would patient need another appointment for FMLA?

## 2023-11-16 ENCOUNTER — Other Ambulatory Visit: Payer: Self-pay

## 2023-11-16 ENCOUNTER — Other Ambulatory Visit (HOSPITAL_COMMUNITY): Payer: Self-pay

## 2023-11-19 ENCOUNTER — Other Ambulatory Visit: Payer: Self-pay

## 2023-11-21 ENCOUNTER — Telehealth: Payer: Self-pay | Admitting: Nutrition

## 2023-11-21 NOTE — Telephone Encounter (Signed)
 Message left on my machine that she is needing a virtural pump training.  I phoned her back and LVM that I do not do virtural trainings, but gave her Christina's number to call to schedule this appointment with her

## 2023-11-22 ENCOUNTER — Ambulatory Visit: Attending: Family Medicine | Admitting: Family Medicine

## 2023-11-22 ENCOUNTER — Encounter: Payer: Self-pay | Admitting: Family Medicine

## 2023-11-22 DIAGNOSIS — S329XXA Fracture of unspecified parts of lumbosacral spine and pelvis, initial encounter for closed fracture: Secondary | ICD-10-CM

## 2023-11-22 DIAGNOSIS — Z029 Encounter for administrative examinations, unspecified: Secondary | ICD-10-CM

## 2023-11-22 DIAGNOSIS — S329XXS Fracture of unspecified parts of lumbosacral spine and pelvis, sequela: Secondary | ICD-10-CM | POA: Insufficient documentation
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
## 2023-11-26 ENCOUNTER — Other Ambulatory Visit: Payer: Self-pay

## 2023-11-26 ENCOUNTER — Other Ambulatory Visit: Payer: Self-pay | Admitting: Family Medicine

## 2023-11-26 MED ORDER — FLUCONAZOLE 150 MG PO TABS
150.0000 mg | ORAL_TABLET | Freq: Once | ORAL | 1 refills | Status: AC
Start: 2023-11-26 — End: 2023-11-29
  Filled 2023-11-26 – 2023-11-27 (×3): qty 2, 2d supply, fill #0

## 2023-11-27 ENCOUNTER — Other Ambulatory Visit: Payer: Self-pay

## 2023-11-27 ENCOUNTER — Other Ambulatory Visit (HOSPITAL_COMMUNITY): Payer: Self-pay

## 2023-11-28 ENCOUNTER — Other Ambulatory Visit: Payer: Self-pay

## 2023-11-30 ENCOUNTER — Other Ambulatory Visit (HOSPITAL_COMMUNITY): Payer: Self-pay

## 2023-11-30 ENCOUNTER — Other Ambulatory Visit: Payer: Self-pay

## 2023-11-30 ENCOUNTER — Encounter (HOSPITAL_COMMUNITY): Payer: Self-pay

## 2023-12-04 ENCOUNTER — Telehealth: Payer: Self-pay | Admitting: Nutrition

## 2023-12-04 ENCOUNTER — Other Ambulatory Visit (HOSPITAL_COMMUNITY): Payer: Self-pay

## 2023-12-04 NOTE — Telephone Encounter (Signed)
 Email sent to [facility] for virtural training needed for this patient

## 2023-12-07 ENCOUNTER — Other Ambulatory Visit: Payer: Self-pay

## 2023-12-10 ENCOUNTER — Other Ambulatory Visit: Payer: Self-pay

## 2023-12-19 ENCOUNTER — Encounter: Payer: Self-pay | Admitting: Family Medicine

## 2023-12-19 ENCOUNTER — Other Ambulatory Visit: Payer: Self-pay | Admitting: Family Medicine

## 2023-12-19 ENCOUNTER — Other Ambulatory Visit: Payer: Self-pay

## 2023-12-19 MED ORDER — TIZANIDINE HCL 4 MG PO TABS
4.0000 mg | ORAL_TABLET | Freq: Three times a day (TID) | ORAL | 1 refills | Status: AC | PRN
  Filled 2023-12-19 – 2023-12-20 (×2): qty 60, 20d supply, fill #0
  Filled 2024-02-16: qty 60, 20d supply, fill #1

## 2023-12-20 ENCOUNTER — Other Ambulatory Visit (HOSPITAL_COMMUNITY): Payer: Self-pay

## 2023-12-20 ENCOUNTER — Other Ambulatory Visit: Payer: Self-pay

## 2023-12-20 ENCOUNTER — Telehealth

## 2023-12-20 ENCOUNTER — Other Ambulatory Visit: Payer: Self-pay | Admitting: Family Medicine

## 2023-12-20 DIAGNOSIS — G8929 Other chronic pain: Secondary | ICD-10-CM

## 2023-12-21 ENCOUNTER — Telehealth

## 2023-12-31 ENCOUNTER — Other Ambulatory Visit (HOSPITAL_COMMUNITY): Payer: Self-pay

## 2024-01-01 ENCOUNTER — Other Ambulatory Visit: Payer: Self-pay | Admitting: Family Medicine

## 2024-01-01 ENCOUNTER — Other Ambulatory Visit: Payer: Self-pay

## 2024-01-01 ENCOUNTER — Telehealth: Admitting: Physician Assistant

## 2024-01-01 DIAGNOSIS — M5431 Sciatica, right side: Secondary | ICD-10-CM

## 2024-01-01 DIAGNOSIS — Z8781 Personal history of (healed) traumatic fracture: Secondary | ICD-10-CM

## 2024-01-01 DIAGNOSIS — M792 Neuralgia and neuritis, unspecified: Secondary | ICD-10-CM

## 2024-01-01 MED ORDER — GABAPENTIN 300 MG PO CAPS
ORAL_CAPSULE | ORAL | 6 refills | Status: DC
Start: 2024-01-01 — End: 2024-04-28
  Filled 2024-01-01: qty 210, fill #0
  Filled 2024-01-09: qty 210, 30d supply, fill #0
  Filled 2024-01-16 – 2024-01-18 (×2): qty 210, fill #0
  Filled 2024-01-21 (×2): qty 210, 30d supply, fill #0
  Filled 2024-02-16: qty 210, 30d supply, fill #1
  Filled 2024-03-14: qty 210, 30d supply, fill #2
  Filled 2024-04-12 – 2024-04-15 (×2): qty 210, 30d supply, fill #3
  Filled 2024-04-15: qty 210, 30d supply, fill #0

## 2024-01-01 NOTE — Patient Instructions (Signed)
 Camala Larkin Plumb, thank you for joining Angelia Kelp, PA-C for today's virtual visit.  While this provider is not your primary care provider (PCP), if your PCP is located in our provider database this encounter information will be shared with them immediately following your visit.   A [facility] MyChart account gives you access to today's visit and all your visits, tests, and labs performed at [facility] " click here if you don't have a [facility] MyChart account or go to [email or web address]  Consent: (Patient) Madison Williams provided verbal consent for this virtual visit at the beginning of the encounter.  Current Medications:  Current Outpatient Medications:    clonazePAM  (KLONOPIN ) 1 MG tablet, Take 1 mg by mouth 2 (two) times daily., Disp: , Rfl:    Continuous Glucose Sensor (DEXCOM G7 SENSOR) MISC, Change every 10 days., Disp: 9 each, Rfl: 3   Continuous Glucose Sensor (DEXCOM G7 SENSOR) MISC, Change every 10 days., Disp: 9 each, Rfl: 3   Continuous Glucose Sensor (FREESTYLE LIBRE 3 PLUS SENSOR) MISC, Change sensor every 15 days., Disp: 6 each, Rfl: 3   FLUoxetine  (PROZAC ) 20 MG capsule, Take 20 mg by mouth 2 (two) times daily., Disp: , Rfl:    gabapentin  (NEURONTIN ) 300 MG capsule, Take 2 capsules (600 mg total) by mouth 3 (three) times daily as needed. (Patient taking differently: Take 600 mg by mouth 3 (three) times daily.), Disp: 180 capsule, Rfl: 6   Insulin  Disposable Pump (OMNIPOD 5 G7 INTRO, GEN 5,) KIT, Apply route every other day., Disp: 1 kit, Rfl: 0   Insulin  Disposable Pump (OMNIPOD 5 G7 PODS, GEN 5,) MISC, use every other day., Disp: 45 each, Rfl: 3   insulin  glargine (LANTUS  SOLOSTAR) 100 UNIT/ML Solostar Pen, Inject 32 Units into the skin daily., Disp: 30 mL, Rfl: 4   insulin  lispro (HUMALOG  KWIKPEN) 100 UNIT/ML KwikPen, Max daily 30 units (Patient taking differently: Inject 0-30 Units into the skin 3 (three) times daily. Max daily 30  units), Disp: 30 mL, Rfl: 3   Insulin  Pen Needle 32G X 4 MM MISC, USE AS DIRECTED in the morning, at noon, in the evening, and at bedtime., Disp: 400 each, Rfl: 3   levothyroxine  (SYNTHROID ) 50 MCG tablet, Take 1 tablet (50 mcg total) by mouth daily., Disp: 90 tablet, Rfl: 3   QUEtiapine  (SEROQUEL ) 50 MG tablet, Take 1 tablet (50 mg total) by mouth at bedtime., Disp: 30 tablet, Rfl: 0   tiZANidine  (ZANAFLEX ) 4 MG tablet, Take 1 tablet (4 mg total) by mouth every 8 (eight) hours as needed., Disp: 60 tablet, Rfl: 1   Medications ordered in this encounter:  No orders of the defined types were placed in this encounter.    *If you need refills on other medications prior to your next appointment, please contact your pharmacy*  Follow-Up: Call back or seek an in-person evaluation if the symptoms worsen or if the condition fails to improve as anticipated.   [facility] [phone]   If you have been instructed to have an in-person evaluation today at a local Urgent Care facility, please use the link below. It will take you to a list of all of our available [facility], including address, phone number and hours of operation. Please do not delay care.  [facility]  If you or a family member do not have a primary care provider, use the link below to schedule a visit and establish care. When you  choose a [facility] primary care physician or advanced practice provider, you gain a long-term partner in health. Find a Primary Care Provider  Learn more about [facility]'s in-office and virtual care options: [facility] - Get Care Now

## 2024-01-01 NOTE — Telephone Encounter (Unsigned)
 Copied from CRM [id number]. Topic: Clinical - Medication Refill >> Jan 01, 2024  9:03 AM Madison Williams wrote: Medication: gabapentin  (NEURONTIN ) 300 MG capsule  Has the patient contacted their pharmacy? No  This is the patient's preferred pharmacy:  [facility] [address], [address] [place] [address] Phone: [phone] Fax: [phone]  Is this the correct pharmacy for this prescription? Yes  Has the prescription been filled recently? Yes  Is the patient out of the medication? Yes  Has the patient been seen for an appointment in the last year OR does the patient have an upcoming appointment? Yes  Can we respond through MyChart? Yes  Agent: Please be advised that Rx refills may take up to 3 business days. We ask that you follow-up with your pharmacy.  Patient said she has been taking 2-3 more per day over the last 2 weeks due to the pain
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
## 2024-01-02 ENCOUNTER — Other Ambulatory Visit: Payer: Self-pay

## 2024-01-09 ENCOUNTER — Other Ambulatory Visit (HOSPITAL_COMMUNITY): Payer: Self-pay

## 2024-01-09 ENCOUNTER — Other Ambulatory Visit: Payer: Self-pay

## 2024-01-16 ENCOUNTER — Other Ambulatory Visit: Payer: Self-pay

## 2024-01-18 ENCOUNTER — Other Ambulatory Visit (HOSPITAL_COMMUNITY): Payer: Self-pay

## 2024-01-21 ENCOUNTER — Other Ambulatory Visit: Payer: Self-pay

## 2024-01-22 ENCOUNTER — Other Ambulatory Visit: Payer: Self-pay

## 2024-02-16 ENCOUNTER — Other Ambulatory Visit (HOSPITAL_COMMUNITY): Payer: Self-pay

## 2024-02-18 ENCOUNTER — Other Ambulatory Visit: Payer: Self-pay

## 2024-02-21 ENCOUNTER — Other Ambulatory Visit (HOSPITAL_COMMUNITY): Payer: Self-pay

## 2024-02-27 ENCOUNTER — Other Ambulatory Visit (HOSPITAL_COMMUNITY): Payer: Self-pay

## 2024-02-28 ENCOUNTER — Other Ambulatory Visit (HOSPITAL_COMMUNITY): Payer: Self-pay

## 2024-02-28 ENCOUNTER — Telehealth (HOSPITAL_COMMUNITY): Payer: Self-pay

## 2024-02-29 ENCOUNTER — Other Ambulatory Visit (HOSPITAL_COMMUNITY): Payer: Self-pay

## 2024-03-03 ENCOUNTER — Telehealth: Payer: Self-pay | Admitting: Pharmacy Technician

## 2024-03-03 ENCOUNTER — Other Ambulatory Visit (HOSPITAL_COMMUNITY): Payer: Self-pay

## 2024-03-03 NOTE — Telephone Encounter (Signed)
 Pharmacy Patient Advocate Encounter   Received notification from Pt Calls Messages that prior authorization for Dexcom G7 Sensor is required/requested.   Insurance verification completed.   The patient is insured through [facility] MEDICAID .   Per test claim: PA required; PA submitted to above mentioned insurance via [facility] Tracks Key/confirmation #/EOC [id number] W Status is pending

## 2024-03-03 NOTE — Telephone Encounter (Signed)
 PA request has been Received. New Encounter has been or will be created for follow up. For additional info see Pharmacy Prior Auth telephone encounter from 03/03/24.

## 2024-03-05 ENCOUNTER — Other Ambulatory Visit (HOSPITAL_COMMUNITY): Payer: Self-pay

## 2024-03-05 ENCOUNTER — Other Ambulatory Visit: Payer: Self-pay

## 2024-03-11 ENCOUNTER — Other Ambulatory Visit (HOSPITAL_COMMUNITY): Payer: Self-pay

## 2024-03-11 NOTE — Telephone Encounter (Signed)
 Pharmacy Patient Advocate Encounter  Received notification from [place] MEDICAID that Prior Authorization for Dexcom Sensor has been APPROVED from 03/03/24 to 08/30/24 pt already filled 03/05/24   PA #/Case ID/Reference #: [id number]

## 2024-03-14 ENCOUNTER — Other Ambulatory Visit (HOSPITAL_COMMUNITY): Payer: Self-pay

## 2024-03-15 ENCOUNTER — Encounter: Payer: Self-pay | Admitting: Family Medicine

## 2024-03-18 ENCOUNTER — Ambulatory Visit: Payer: Medicaid Other | Admitting: Family Medicine

## 2024-03-25 ENCOUNTER — Ambulatory Visit: Admitting: Family Medicine

## 2024-03-25 ENCOUNTER — Ambulatory Visit: Payer: Medicaid Other | Admitting: Internal Medicine

## 2024-04-02 ENCOUNTER — Encounter: Payer: Self-pay | Admitting: Family Medicine

## 2024-04-02 ENCOUNTER — Encounter: Payer: Self-pay | Admitting: Internal Medicine

## 2024-04-02 ENCOUNTER — Other Ambulatory Visit: Payer: Self-pay

## 2024-04-03 ENCOUNTER — Other Ambulatory Visit (HOSPITAL_COMMUNITY): Payer: Self-pay

## 2024-04-04 ENCOUNTER — Other Ambulatory Visit (HOSPITAL_COMMUNITY): Payer: Self-pay

## 2024-04-12 ENCOUNTER — Other Ambulatory Visit (HOSPITAL_COMMUNITY): Payer: Self-pay

## 2024-04-15 ENCOUNTER — Other Ambulatory Visit: Payer: Self-pay

## 2024-04-15 ENCOUNTER — Other Ambulatory Visit (HOSPITAL_COMMUNITY): Payer: Self-pay

## 2024-04-28 ENCOUNTER — Ambulatory Visit
Admission: RE | Admit: 2024-04-28 | Discharge: 2024-04-28 | Disposition: A | Discharge status: Home / Self Care | Source: Ambulatory Visit | Attending: Physical Medicine and Rehabilitation | Admitting: Physical Medicine and Rehabilitation

## 2024-04-28 ENCOUNTER — Other Ambulatory Visit: Payer: Self-pay

## 2024-04-28 ENCOUNTER — Ambulatory Visit: Payer: Self-pay | Admitting: *Deleted

## 2024-04-28 ENCOUNTER — Other Ambulatory Visit: Payer: Self-pay | Admitting: Physical Medicine and Rehabilitation

## 2024-04-28 ENCOUNTER — Telehealth: Payer: Self-pay | Admitting: *Deleted

## 2024-04-28 ENCOUNTER — Encounter: Attending: Physical Medicine and Rehabilitation | Admitting: Physical Medicine and Rehabilitation

## 2024-04-28 ENCOUNTER — Other Ambulatory Visit (HOSPITAL_COMMUNITY): Payer: Self-pay

## 2024-04-28 ENCOUNTER — Encounter: Payer: Self-pay | Admitting: Physical Medicine and Rehabilitation

## 2024-04-28 VITALS — BP 109/76 | HR 99 | Ht 67.0 in | Wt 130.2 lb

## 2024-04-28 DIAGNOSIS — S32810S Multiple fractures of pelvis with stable disruption of pelvic ring, sequela: Secondary | ICD-10-CM | POA: Diagnosis present

## 2024-04-28 DIAGNOSIS — S32811S Multiple fractures of pelvis with unstable disruption of pelvic ring, sequela: Secondary | ICD-10-CM

## 2024-04-28 DIAGNOSIS — S32811A Multiple fractures of pelvis with unstable disruption of pelvic ring, initial encounter for closed fracture: Secondary | ICD-10-CM | POA: Insufficient documentation

## 2024-04-28 DIAGNOSIS — G8921 Chronic pain due to trauma: Secondary | ICD-10-CM | POA: Diagnosis not present

## 2024-04-28 DIAGNOSIS — N319 Neuromuscular dysfunction of bladder, unspecified: Secondary | ICD-10-CM | POA: Diagnosis present

## 2024-04-28 DIAGNOSIS — G8929 Other chronic pain: Secondary | ICD-10-CM | POA: Insufficient documentation

## 2024-04-28 DIAGNOSIS — Z5181 Encounter for therapeutic drug level monitoring: Secondary | ICD-10-CM

## 2024-04-28 DIAGNOSIS — S32810A Multiple fractures of pelvis with stable disruption of pelvic ring, initial encounter for closed fracture: Secondary | ICD-10-CM | POA: Insufficient documentation

## 2024-04-28 DIAGNOSIS — G834 Cauda equina syndrome: Secondary | ICD-10-CM

## 2024-04-28 MED ORDER — GABAPENTIN 600 MG PO TABS
600.0000 mg | ORAL_TABLET | Freq: Three times a day (TID) | ORAL | 5 refills | Status: DC
  Filled 2024-04-28 – 2024-04-29 (×2): qty 180, 60d supply, fill #0
  Filled 2024-06-22: qty 180, 60d supply, fill #1
  Filled 2024-07-30 – 2024-07-31 (×2): qty 180, 60d supply, fill #2

## 2024-04-28 MED ORDER — ACETAMINOPHEN-CODEINE 300-60 MG PO TABS
1.0000 | ORAL_TABLET | Freq: Four times a day (QID) | ORAL | 0 refills | Status: DC | PRN
  Filled 2024-04-28 – 2024-04-29 (×2): qty 28, 7d supply, fill #0

## 2024-04-28 NOTE — Telephone Encounter (Signed)
 Patient was grateful the call and option offered with [facility]. She was able to be seen by pain specialist today and will f/u with PCP tomorrow.
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
## 2024-04-28 NOTE — Telephone Encounter (Signed)
 Unable to reach by phone to request she go to [facility] to give a urine sample for UDS. It was not collected before she left our office. No answer and VM full. I have sent letter through [facility] requesting this as well.
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
## 2024-04-29 ENCOUNTER — Encounter (HOSPITAL_COMMUNITY): Payer: Self-pay

## 2024-04-29 ENCOUNTER — Ambulatory Visit: Attending: Family Medicine | Admitting: Family Medicine

## 2024-04-29 ENCOUNTER — Other Ambulatory Visit (HOSPITAL_COMMUNITY): Payer: Self-pay

## 2024-04-29 ENCOUNTER — Other Ambulatory Visit: Payer: Self-pay

## 2024-04-29 ENCOUNTER — Telehealth: Payer: Self-pay | Admitting: Physical Medicine and Rehabilitation

## 2024-04-29 ENCOUNTER — Encounter: Payer: Self-pay | Admitting: Family Medicine

## 2024-04-29 VITALS — BP 111/76 | HR 75 | Ht 67.0 in | Wt 130.8 lb

## 2024-04-29 DIAGNOSIS — E7800 Pure hypercholesterolemia, unspecified: Secondary | ICD-10-CM | POA: Insufficient documentation

## 2024-04-29 DIAGNOSIS — F1010 Alcohol abuse, uncomplicated: Secondary | ICD-10-CM | POA: Insufficient documentation

## 2024-04-29 DIAGNOSIS — M79606 Pain in leg, unspecified: Secondary | ICD-10-CM | POA: Diagnosis not present

## 2024-04-29 DIAGNOSIS — E785 Hyperlipidemia, unspecified: Secondary | ICD-10-CM

## 2024-04-29 DIAGNOSIS — E1069 Type 1 diabetes mellitus with other specified complication: Secondary | ICD-10-CM

## 2024-04-29 DIAGNOSIS — Z794 Long term (current) use of insulin: Secondary | ICD-10-CM | POA: Insufficient documentation

## 2024-04-29 DIAGNOSIS — G8929 Other chronic pain: Secondary | ICD-10-CM | POA: Diagnosis present

## 2024-04-29 DIAGNOSIS — G834 Cauda equina syndrome: Secondary | ICD-10-CM | POA: Insufficient documentation

## 2024-04-29 DIAGNOSIS — Z79899 Other long term (current) drug therapy: Secondary | ICD-10-CM | POA: Insufficient documentation

## 2024-04-29 DIAGNOSIS — I10 Essential (primary) hypertension: Secondary | ICD-10-CM | POA: Insufficient documentation

## 2024-04-29 DIAGNOSIS — X58XXXS Exposure to other specified factors, sequela: Secondary | ICD-10-CM | POA: Insufficient documentation

## 2024-04-29 DIAGNOSIS — F419 Anxiety disorder, unspecified: Secondary | ICD-10-CM | POA: Diagnosis not present

## 2024-04-29 DIAGNOSIS — M069 Rheumatoid arthritis, unspecified: Secondary | ICD-10-CM | POA: Insufficient documentation

## 2024-04-29 DIAGNOSIS — R200 Anesthesia of skin: Secondary | ICD-10-CM | POA: Insufficient documentation

## 2024-04-29 DIAGNOSIS — E1165 Type 2 diabetes mellitus with hyperglycemia: Secondary | ICD-10-CM | POA: Diagnosis not present

## 2024-04-29 DIAGNOSIS — K089 Disorder of teeth and supporting structures, unspecified: Secondary | ICD-10-CM

## 2024-04-29 DIAGNOSIS — F17290 Nicotine dependence, other tobacco product, uncomplicated: Secondary | ICD-10-CM | POA: Insufficient documentation

## 2024-04-29 DIAGNOSIS — Z5982 Transportation insecurity: Secondary | ICD-10-CM | POA: Insufficient documentation

## 2024-04-29 DIAGNOSIS — R102 Pelvic and perineal pain: Secondary | ICD-10-CM | POA: Insufficient documentation

## 2024-04-29 DIAGNOSIS — S329XXS Fracture of unspecified parts of lumbosacral spine and pelvis, sequela: Secondary | ICD-10-CM | POA: Insufficient documentation

## 2024-04-29 DIAGNOSIS — E1065 Type 1 diabetes mellitus with hyperglycemia: Secondary | ICD-10-CM | POA: Diagnosis not present

## 2024-04-29 LAB — POCT GLYCOSYLATED HEMOGLOBIN (HGB A1C): HbA1c, POC (controlled diabetic range): 8.5 % — AB (ref 0.0–7.0)
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
## 2024-04-29 NOTE — Patient Instructions (Signed)
Chronic Pain, Adult Chronic pain is a type of pain that lasts or keeps coming back for at least 3-6 months. You may have headaches, pain in the abdomen, or pain in other areas of the body. Chronic pain may be related to an illness, injury, or a health condition. Sometimes, the cause of chronic pain is not known. Chronic pain can make it hard for you to do daily activities. If it is not treated, chronic pain can lead to anxiety and depression. Treatment depends on the cause of your pain and how severe it is. You may need to work with a pain specialist to come up with a treatment plan. Many people benefit from two or more types of treatment to control their pain. Follow these instructions at home: Treatment plan Follow your treatment plan as told by your health care provider. This may include: Gentle, regular exercise. Eating a healthy diet that includes foods such as vegetables, fruits, fish, and lean meats. Mental health therapy (cognitive or behavioral therapy) that changes the way you think or act in response to the pain. This may help improve how you feel. Doing physical therapy exercises to improve movement and strength. Meditation, yoga, acupuncture, or massage therapy. Using the oils from plants in your environment or on your skin (aromatherapy). Other treatments may include: Over-the-counter or prescription medicines. Color, light, or sound therapy. Local electrical stimulation. The electrical pulses help to relieve pain by temporarily stopping the nerve impulses that cause you to feel pain. Injections. These deliver numbing or pain-relieving medicines into the spine or the area of pain.  Medicines Take over-the-counter and prescription medicines only as told by your health care provider. Ask your health care provider if the medicine prescribed to you: Requires you to avoid driving or using machinery. Can cause constipation. You may need to take these actions to prevent or treat  constipation: Drink enough fluid to keep your urine pale yellow. Take over-the-counter or prescription medicines. Eat foods that are high in fiber, such as beans, whole grains, and fresh fruits and vegetables. Limit foods that are high in fat and processed sugars, such as fried or sweet foods. Lifestyle  Ask your health care provider whether you should keep a pain diary. Your health care provider will tell you what information to write in the diary. This may include: When you have pain. What the pain feels like. How medicines and other behaviors or treatments help to reduce the pain. Consider talking with a mental health care provider about how to help manage chronic pain. Consider joining a chronic pain support group. Try to control or lower your stress levels. Talk with your health care provider about ways to do this. General instructions Learn as much as you can about how to manage your chronic pain. Ask your health care provider if an intensive pain rehabilitation program or a chronic pain specialist would be helpful. Check your pain level as told by your health care provider. Ask your health care provider if you should use a pain scale. Contact a health care provider if: Your pain is not controlled with treatment. You have new pain. You have side effects from pain medicine. You feel weak or you have trouble doing your normal activities. You have trouble sleeping or you develop confusion. You lose feeling or have numbness in your body. You lose control of your bowels or bladder. Get help right away if: Your pain suddenly gets much worse. You develop chest pain. You have trouble breathing or shortness of  breath. You faint, or another person sees you faint. These symptoms may be an emergency. Get help right away. Call 911. Do not wait to see if the symptoms will go away. Do not drive yourself to the hospital. Also, get help right away if: You have thoughts about hurting yourself  or others. Take one of these steps if you feel like you may hurt yourself or others, or have thoughts about taking your own life: Go to your nearest emergency room. Call 911. Call the National Suicide Prevention Lifeline at 613-055-8912 or 988. This is open 24 hours a day. Text the Crisis Text Line at 323-193-1954. This information is not intended to replace advice given to you by your health care provider. Make sure you discuss any questions you have with your health care provider. Document Revised: 03/08/2022 Document Reviewed: 02/08/2022 Elsevier Patient Education  2024 [employer].

## 2024-04-29 NOTE — Telephone Encounter (Signed)
 I called and spoke with her.
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
## 2024-04-30 ENCOUNTER — Ambulatory Visit: Payer: Self-pay | Admitting: Family Medicine

## 2024-04-30 LAB — LP+NON-HDL CHOLESTEROL
Cholesterol, Total: 271 mg/dL — ABNORMAL HIGH (ref 100–199)
HDL: 149 mg/dL (ref >39)
LDL Chol Calc (NIH): 108 mg/dL — ABNORMAL HIGH (ref 0–99)
Total Non-HDL-Chol (LDL+VLDL): 122 mg/dL (ref 0–129)
Triglycerides: 89 mg/dL (ref 0–149)
VLDL Cholesterol Cal: 14 mg/dL (ref 5–40)

## 2024-04-30 LAB — CMP14+EGFR
ALT: 29 IU/L (ref 0–32)
AST: 29 IU/L (ref 0–40)
Albumin: 4.7 g/dL (ref 3.9–4.9)
Alkaline Phosphatase: 95 IU/L (ref 41–116)
BUN/Creatinine Ratio: 7 — ABNORMAL LOW (ref 9–23)
BUN: 6 mg/dL (ref 6–20)
Bilirubin Total: 0.3 mg/dL (ref 0.0–1.2)
CO2: 22 mmol/L (ref 20–29)
Calcium: 9 mg/dL (ref 8.7–10.2)
Chloride: 95 mmol/L — ABNORMAL LOW (ref 96–106)
Creatinine, Ser: 0.86 mg/dL (ref 0.57–1.00)
Globulin, Total: 2.9 g/dL (ref 1.5–4.5)
Glucose: 257 mg/dL — ABNORMAL HIGH (ref 70–99)
Potassium: 5.1 mmol/L (ref 3.5–5.2)
Sodium: 136 mmol/L (ref 134–144)
Total Protein: 7.6 g/dL (ref 6.0–8.5)
eGFR: 89 mL/min/1.73 (ref >59)

## 2024-04-30 LAB — MICROALBUMIN / CREATININE URINE RATIO
Creatinine, Urine: 85.2 mg/dL
Microalb/Creat Ratio: 4 mg/g{creat} (ref 0–29)
Microalbumin, Urine: 3.1 ug/mL

## 2024-04-30 MED ORDER — ATORVASTATIN CALCIUM 20 MG PO TABS: 20.0000 mg | ORAL_TABLET | Freq: Every day | ORAL | 1 refills | Status: AC

## 2024-05-01 ENCOUNTER — Telehealth: Payer: Self-pay

## 2024-05-01 NOTE — Telephone Encounter (Signed)
 Patient calling into office requesting a refill on Tylenol  4.  Patient reports being advised to call the office to request the refill.  Patient advised to allow 24-48 hours for refill, patient verbalized understanding.

## 2024-05-02 LAB — TOXASSURE SELECT,+ANTIDEPR,UR

## 2024-05-02 MED ORDER — ACETAMINOPHEN-CODEINE 300-60 MG PO TABS
1.0000 | ORAL_TABLET | Freq: Four times a day (QID) | ORAL | 5 refills | Status: DC | PRN
  Filled 2024-05-02 – 2024-05-05 (×3): qty 120, 30d supply, fill #0
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
## 2024-05-03 ENCOUNTER — Other Ambulatory Visit (HOSPITAL_COMMUNITY): Payer: Self-pay

## 2024-05-03 ENCOUNTER — Encounter (HOSPITAL_COMMUNITY): Payer: Self-pay

## 2024-05-04 ENCOUNTER — Other Ambulatory Visit (HOSPITAL_COMMUNITY): Payer: Self-pay

## 2024-05-05 ENCOUNTER — Other Ambulatory Visit: Payer: Self-pay

## 2024-05-05 ENCOUNTER — Other Ambulatory Visit (HOSPITAL_COMMUNITY): Payer: Self-pay

## 2024-05-06 ENCOUNTER — Other Ambulatory Visit: Payer: Self-pay | Admitting: *Deleted

## 2024-05-06 ENCOUNTER — Other Ambulatory Visit (HOSPITAL_COMMUNITY): Payer: Self-pay

## 2024-05-06 ENCOUNTER — Telehealth: Payer: Self-pay | Admitting: *Deleted

## 2024-05-06 ENCOUNTER — Encounter: Payer: Self-pay | Admitting: Physical Medicine and Rehabilitation

## 2024-05-06 ENCOUNTER — Encounter (HOSPITAL_COMMUNITY): Payer: Self-pay

## 2024-05-06 ENCOUNTER — Other Ambulatory Visit: Payer: Self-pay | Admitting: Physical Medicine and Rehabilitation

## 2024-05-06 MED ORDER — ACETAMINOPHEN-CODEINE 300-30 MG PO TABS: 1.0000 | ORAL_TABLET | Freq: Four times a day (QID) | ORAL | 0 refills | Status: DC | PRN

## 2024-05-06 MED ORDER — ACETAMINOPHEN-CODEINE 300-60 MG PO TABS: 1.0000 | ORAL_TABLET | ORAL | 0 refills | Status: DC | PRN

## 2024-05-06 NOTE — Telephone Encounter (Signed)
 Call placed to [facility] to cancel the Rx sent over on Tylenol  #3 today.

## 2024-05-13 ENCOUNTER — Encounter: Payer: Self-pay | Admitting: Physical Medicine and Rehabilitation

## 2024-05-16 NOTE — Telephone Encounter (Signed)
 Pt called and stated if she goes to the er she has no one to watch her daughter. What should she do

## 2024-05-27 ENCOUNTER — Other Ambulatory Visit: Payer: Self-pay | Admitting: Internal Medicine

## 2024-05-29 ENCOUNTER — Other Ambulatory Visit (HOSPITAL_COMMUNITY): Payer: Self-pay

## 2024-05-29 ENCOUNTER — Other Ambulatory Visit: Payer: Self-pay

## 2024-05-29 ENCOUNTER — Other Ambulatory Visit: Payer: Self-pay | Admitting: Internal Medicine

## 2024-05-29 ENCOUNTER — Encounter: Payer: Self-pay | Admitting: Physical Medicine and Rehabilitation

## 2024-05-29 ENCOUNTER — Telehealth: Payer: Self-pay

## 2024-05-29 ENCOUNTER — Encounter (HOSPITAL_COMMUNITY): Payer: Self-pay

## 2024-05-29 MED ORDER — INSULIN LISPRO 100 UNIT/ML IJ SOLN: INTRAMUSCULAR | 11 refills | Status: AC

## 2024-05-29 MED ORDER — ACETAMINOPHEN-CODEINE 300-60 MG PO TABS: 1.0000 | ORAL_TABLET | ORAL | 0 refills | Status: DC | PRN

## 2024-05-29 NOTE — Telephone Encounter (Signed)
 Patient needs humalog  vial script.  Please order

## 2024-06-17 ENCOUNTER — Other Ambulatory Visit: Payer: Self-pay

## 2024-06-17 MED ORDER — ACETAMINOPHEN-CODEINE 300-60 MG PO TABS: 1.0000 | ORAL_TABLET | ORAL | 0 refills | Status: AC | PRN

## 2024-06-23 ENCOUNTER — Other Ambulatory Visit (HOSPITAL_COMMUNITY): Payer: Self-pay

## 2024-06-24 ENCOUNTER — Other Ambulatory Visit (HOSPITAL_COMMUNITY): Payer: Self-pay

## 2024-07-03 ENCOUNTER — Other Ambulatory Visit: Payer: Self-pay | Admitting: Internal Medicine

## 2024-07-04 ENCOUNTER — Other Ambulatory Visit: Payer: Self-pay

## 2024-07-04 MED ORDER — DEXCOM G7 SENSOR MISC
1.0000 | 3 refills | Status: AC
  Filled 2024-07-04: qty 9, 90d supply, fill #0

## 2024-07-12 ENCOUNTER — Telehealth

## 2024-07-30 ENCOUNTER — Other Ambulatory Visit: Payer: Self-pay

## 2024-07-30 ENCOUNTER — Encounter: Payer: Self-pay | Admitting: Physical Medicine and Rehabilitation

## 2024-07-31 ENCOUNTER — Other Ambulatory Visit (HOSPITAL_COMMUNITY): Payer: Self-pay

## 2024-08-01 ENCOUNTER — Encounter: Payer: Self-pay | Admitting: Physical Medicine and Rehabilitation

## 2024-08-01 ENCOUNTER — Encounter: Attending: Physical Medicine and Rehabilitation | Admitting: Physical Medicine and Rehabilitation

## 2024-08-01 ENCOUNTER — Other Ambulatory Visit (HOSPITAL_COMMUNITY): Payer: Self-pay

## 2024-08-01 VITALS — BP 116/81 | HR 81 | Ht 67.0 in | Wt 131.6 lb

## 2024-08-01 DIAGNOSIS — G8921 Chronic pain due to trauma: Secondary | ICD-10-CM | POA: Insufficient documentation

## 2024-08-01 DIAGNOSIS — E1065 Type 1 diabetes mellitus with hyperglycemia: Secondary | ICD-10-CM | POA: Diagnosis not present

## 2024-08-01 DIAGNOSIS — S32810S Multiple fractures of pelvis with stable disruption of pelvic ring, sequela: Secondary | ICD-10-CM | POA: Diagnosis not present

## 2024-08-01 DIAGNOSIS — G834 Cauda equina syndrome: Secondary | ICD-10-CM | POA: Insufficient documentation

## 2024-08-01 MED ORDER — GABAPENTIN 600 MG PO TABS: 1200.0000 mg | ORAL_TABLET | Freq: Three times a day (TID) | ORAL | 1 refills | Status: AC

## 2024-08-01 MED ORDER — TAMSULOSIN HCL 0.4 MG PO CAPS: 0.4000 mg | ORAL_CAPSULE | Freq: Every day | ORAL | 1 refills | Status: AC
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
## 2024-10-27 ENCOUNTER — Ambulatory Visit: Admitting: Family Medicine

## 2024-10-29 ENCOUNTER — Encounter: Admitting: Physical Medicine and Rehabilitation
